# Patient Record
Sex: Male | Born: 1955 | Race: Black or African American | Hispanic: No | Smoking: Current every day smoker
Health system: Southern US, Community
[De-identification: ages and names within clinical notes are randomized; demographics above are authoritative.]

## PROBLEM LIST (undated history)

## (undated) DIAGNOSIS — Z5189 Encounter for other specified aftercare: Secondary | ICD-10-CM

## (undated) DIAGNOSIS — K649 Unspecified hemorrhoids: Secondary | ICD-10-CM

## (undated) DIAGNOSIS — N179 Acute kidney failure, unspecified: Secondary | ICD-10-CM

## (undated) DIAGNOSIS — K922 Gastrointestinal hemorrhage, unspecified: Secondary | ICD-10-CM

## (undated) DIAGNOSIS — E119 Type 2 diabetes mellitus without complications: Secondary | ICD-10-CM

## (undated) DIAGNOSIS — I10 Essential (primary) hypertension: Secondary | ICD-10-CM

## (undated) DIAGNOSIS — A048 Other specified bacterial intestinal infections: Secondary | ICD-10-CM

## (undated) DIAGNOSIS — N189 Chronic kidney disease, unspecified: Secondary | ICD-10-CM

## (undated) DIAGNOSIS — K635 Polyp of colon: Secondary | ICD-10-CM

## (undated) HISTORY — DX: Other specified bacterial intestinal infections: A04.8

## (undated) HISTORY — DX: Acute kidney failure, unspecified: N18.9

## (undated) HISTORY — DX: Gastrointestinal hemorrhage, unspecified: K92.2

## (undated) HISTORY — DX: Unspecified hemorrhoids: K64.9

## (undated) HISTORY — DX: Chronic kidney disease, unspecified: N17.9

## (undated) HISTORY — DX: Polyp of colon: K63.5

## (undated) HISTORY — PX: CARDIAC CATHETERIZATION: SHX172

---

## 2020-04-01 ENCOUNTER — Other Ambulatory Visit: Payer: Self-pay

## 2020-04-01 ENCOUNTER — Encounter (HOSPITAL_COMMUNITY): Payer: Self-pay | Admitting: Emergency Medicine

## 2020-04-01 ENCOUNTER — Emergency Department (HOSPITAL_COMMUNITY): Payer: Self-pay

## 2020-04-01 ENCOUNTER — Inpatient Hospital Stay (HOSPITAL_COMMUNITY)
Admission: EM | Admit: 2020-04-01 | Discharge: 2020-04-05 | DRG: 291 | Disposition: A | Discharge status: Home / Self Care | Payer: Self-pay | Attending: Internal Medicine | Admitting: Internal Medicine

## 2020-04-01 DIAGNOSIS — D649 Anemia, unspecified: Secondary | ICD-10-CM

## 2020-04-01 DIAGNOSIS — G4440 Drug-induced headache, not elsewhere classified, not intractable: Secondary | ICD-10-CM | POA: Diagnosis not present

## 2020-04-01 DIAGNOSIS — R0602 Shortness of breath: Secondary | ICD-10-CM

## 2020-04-01 DIAGNOSIS — D509 Iron deficiency anemia, unspecified: Secondary | ICD-10-CM | POA: Diagnosis present

## 2020-04-01 DIAGNOSIS — I5031 Acute diastolic (congestive) heart failure: Secondary | ICD-10-CM | POA: Diagnosis present

## 2020-04-01 DIAGNOSIS — I517 Cardiomegaly: Secondary | ICD-10-CM | POA: Diagnosis present

## 2020-04-01 DIAGNOSIS — F17210 Nicotine dependence, cigarettes, uncomplicated: Secondary | ICD-10-CM | POA: Diagnosis present

## 2020-04-01 DIAGNOSIS — R601 Generalized edema: Secondary | ICD-10-CM

## 2020-04-01 DIAGNOSIS — R718 Other abnormality of red blood cells: Secondary | ICD-10-CM

## 2020-04-01 DIAGNOSIS — R600 Localized edema: Secondary | ICD-10-CM

## 2020-04-01 DIAGNOSIS — I509 Heart failure, unspecified: Secondary | ICD-10-CM

## 2020-04-01 DIAGNOSIS — R7989 Other specified abnormal findings of blood chemistry: Secondary | ICD-10-CM

## 2020-04-01 DIAGNOSIS — N492 Inflammatory disorders of scrotum: Secondary | ICD-10-CM

## 2020-04-01 DIAGNOSIS — I161 Hypertensive emergency: Secondary | ICD-10-CM | POA: Diagnosis present

## 2020-04-01 DIAGNOSIS — N179 Acute kidney failure, unspecified: Secondary | ICD-10-CM | POA: Diagnosis present

## 2020-04-01 DIAGNOSIS — D6489 Other specified anemias: Secondary | ICD-10-CM | POA: Diagnosis present

## 2020-04-01 DIAGNOSIS — I10 Essential (primary) hypertension: Secondary | ICD-10-CM

## 2020-04-01 DIAGNOSIS — Z20822 Contact with and (suspected) exposure to covid-19: Secondary | ICD-10-CM | POA: Diagnosis present

## 2020-04-01 DIAGNOSIS — I5041 Acute combined systolic (congestive) and diastolic (congestive) heart failure: Secondary | ICD-10-CM | POA: Diagnosis present

## 2020-04-01 DIAGNOSIS — I43 Cardiomyopathy in diseases classified elsewhere: Secondary | ICD-10-CM | POA: Diagnosis present

## 2020-04-01 DIAGNOSIS — T463X5A Adverse effect of coronary vasodilators, initial encounter: Secondary | ICD-10-CM | POA: Diagnosis not present

## 2020-04-01 DIAGNOSIS — I110 Hypertensive heart disease with heart failure: Principal | ICD-10-CM | POA: Diagnosis present

## 2020-04-01 DIAGNOSIS — N281 Cyst of kidney, acquired: Secondary | ICD-10-CM | POA: Diagnosis present

## 2020-04-01 DIAGNOSIS — K922 Gastrointestinal hemorrhage, unspecified: Secondary | ICD-10-CM

## 2020-04-01 DIAGNOSIS — D696 Thrombocytopenia, unspecified: Secondary | ICD-10-CM

## 2020-04-01 HISTORY — DX: Essential (primary) hypertension: I10

## 2020-04-01 HISTORY — DX: Type 2 diabetes mellitus without complications: E11.9

## 2020-04-01 LAB — CBC
HCT: 19 % — ABNORMAL LOW (ref 39.0–52.0)
Hemoglobin: 5.3 g/dL — CL (ref 13.0–17.0)
MCH: 19.1 pg — ABNORMAL LOW (ref 26.0–34.0)
MCHC: 27.9 g/dL — ABNORMAL LOW (ref 30.0–36.0)
MCV: 68.3 fL — ABNORMAL LOW (ref 80.0–100.0)
Platelets: 173 10*3/uL (ref 150–400)
RBC: 2.78 MIL/uL — ABNORMAL LOW (ref 4.22–5.81)
RDW: 20.6 % — ABNORMAL HIGH (ref 11.5–15.5)
WBC: 7.3 10*3/uL (ref 4.0–10.5)
nRBC: 0 % (ref 0.0–0.2)

## 2020-04-01 LAB — ABO/RH: ABO/RH(D): O POS

## 2020-04-01 LAB — PREPARE RBC (CROSSMATCH)

## 2020-04-01 LAB — POC OCCULT BLOOD, ED: Fecal Occult Bld: POSITIVE — AB

## 2020-04-01 LAB — RESPIRATORY PANEL BY RT PCR (FLU A&B, COVID)
Influenza A by PCR: NEGATIVE
Influenza B by PCR: NEGATIVE
SARS Coronavirus 2 by RT PCR: NEGATIVE

## 2020-04-01 LAB — COMPREHENSIVE METABOLIC PANEL
ALT: 28 U/L (ref 0–44)
AST: 41 U/L (ref 15–41)
Albumin: 2.9 g/dL — ABNORMAL LOW (ref 3.5–5.0)
Alkaline Phosphatase: 93 U/L (ref 38–126)
Anion gap: 10 (ref 5–15)
BUN: 56 mg/dL — ABNORMAL HIGH (ref 8–23)
CO2: 19 mmol/L — ABNORMAL LOW (ref 22–32)
Calcium: 8.4 mg/dL — ABNORMAL LOW (ref 8.9–10.3)
Chloride: 105 mmol/L (ref 98–111)
Creatinine, Ser: 4.12 mg/dL — ABNORMAL HIGH (ref 0.61–1.24)
GFR, Estimated: 16 mL/min — ABNORMAL LOW (ref >60)
Glucose, Bld: 148 mg/dL — ABNORMAL HIGH (ref 70–99)
Potassium: 4.5 mmol/L (ref 3.5–5.1)
Sodium: 134 mmol/L — ABNORMAL LOW (ref 135–145)
Total Bilirubin: 0.7 mg/dL (ref 0.3–1.2)
Total Protein: 7.4 g/dL (ref 6.5–8.1)

## 2020-04-01 LAB — CBG MONITORING, ED: Glucose-Capillary: 108 mg/dL — ABNORMAL HIGH (ref 70–99)

## 2020-04-01 LAB — SAVE SMEAR(SSMR), FOR PROVIDER SLIDE REVIEW

## 2020-04-01 LAB — HIV ANTIBODY (ROUTINE TESTING W REFLEX): HIV Screen 4th Generation wRfx: NONREACTIVE

## 2020-04-01 IMAGING — US US RENAL
1 series · 14 of 25 positions shown · non-contrast
Comparison: None.

CLINICAL DATA: Acute renal injury

EXAM:
RENAL / URINARY TRACT ULTRASOUND COMPLETE

[Series 1: us renal · 14 of 45 slices shown]
[im 1/45]
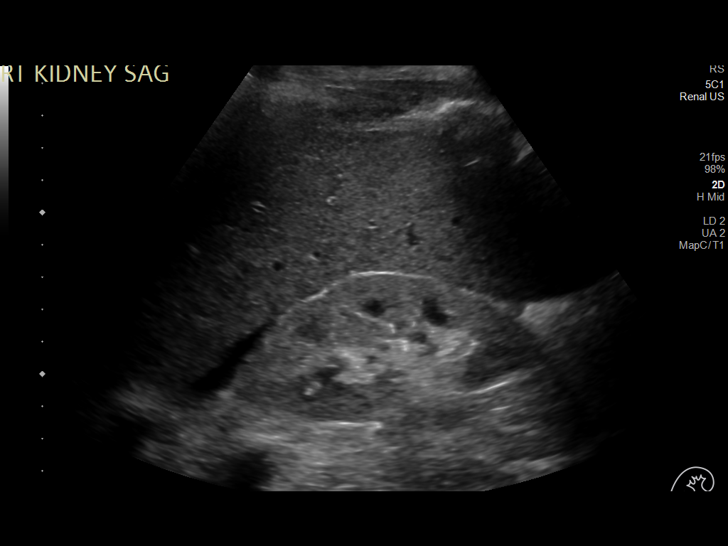
[im 4/45]
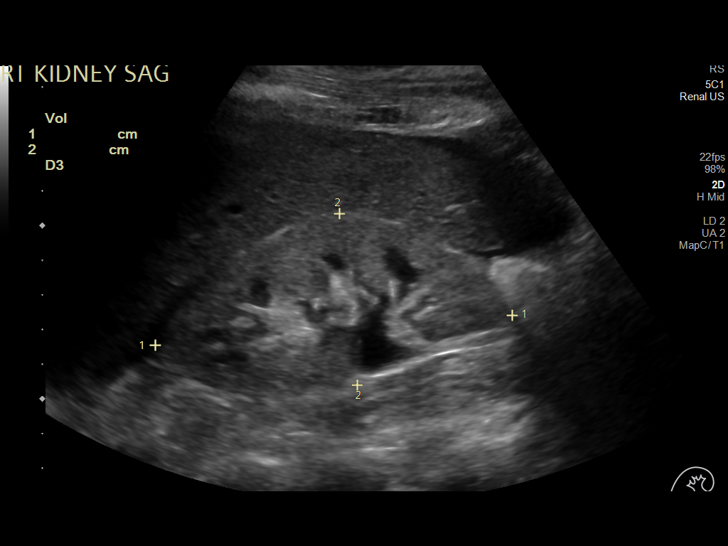
[im 8/45]
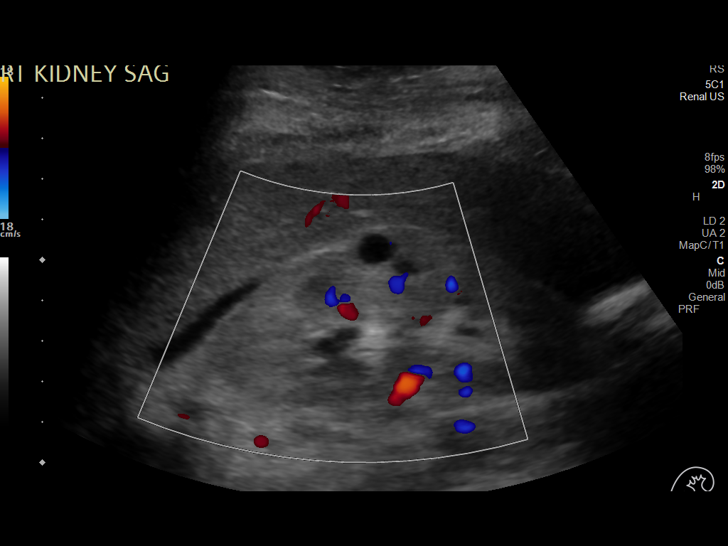
[im 12/45]
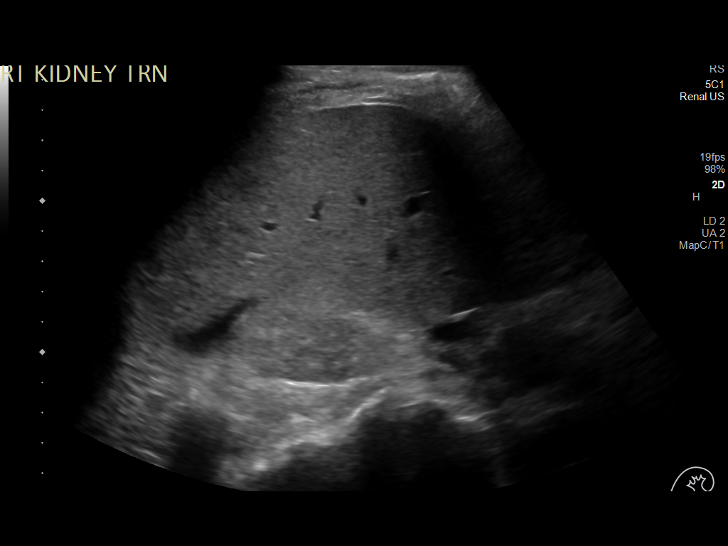
[im 15/45]
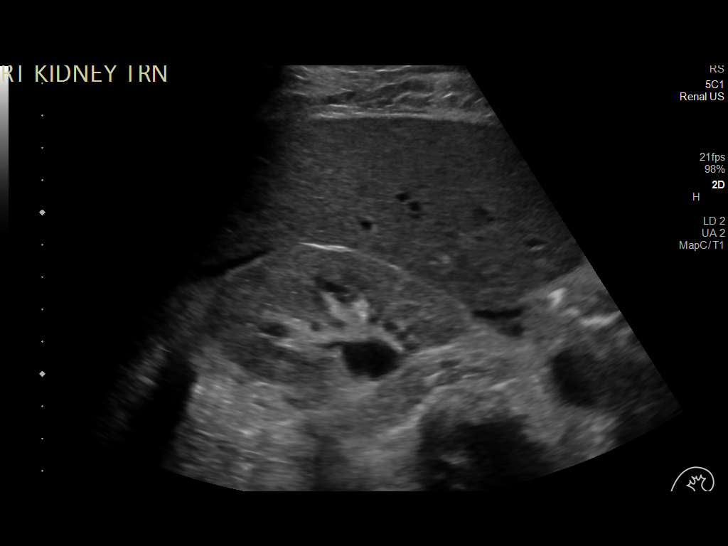
[im 17/45]
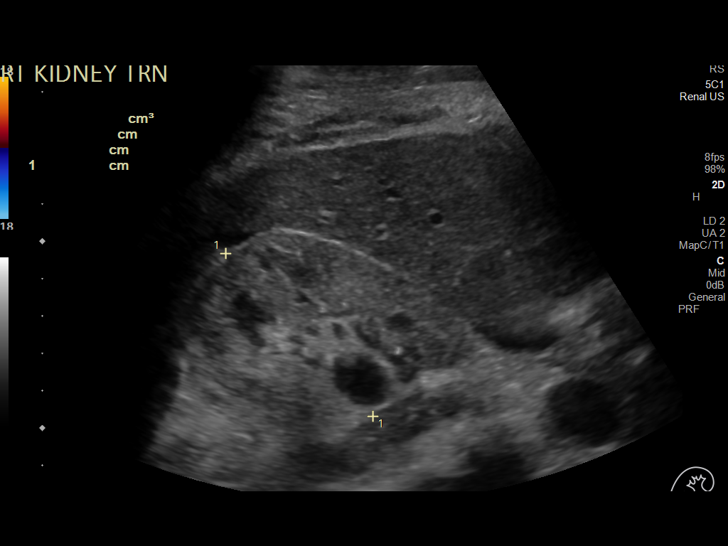
[im 21/45]
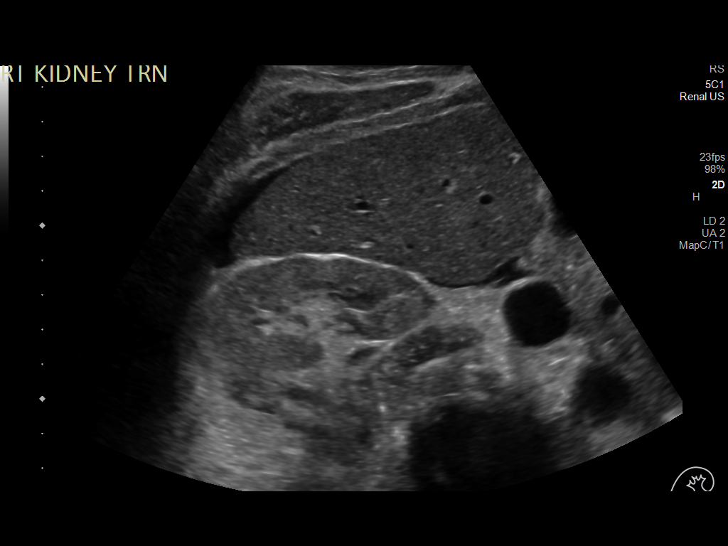
[im 24/45]
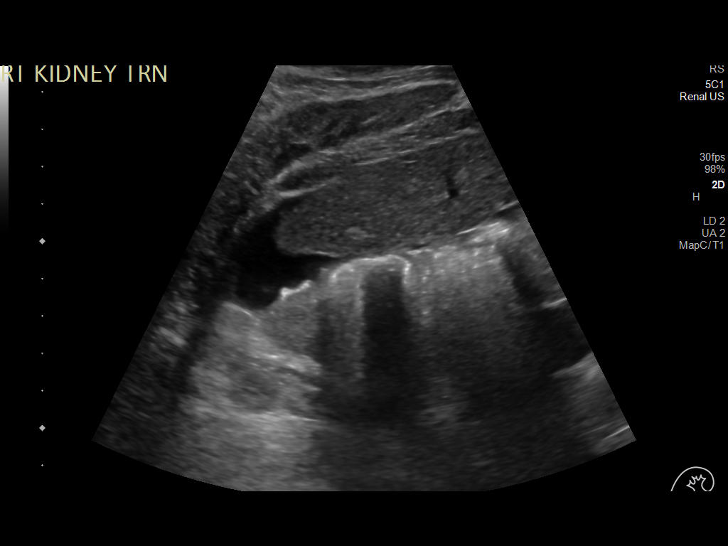
[im 28/45]
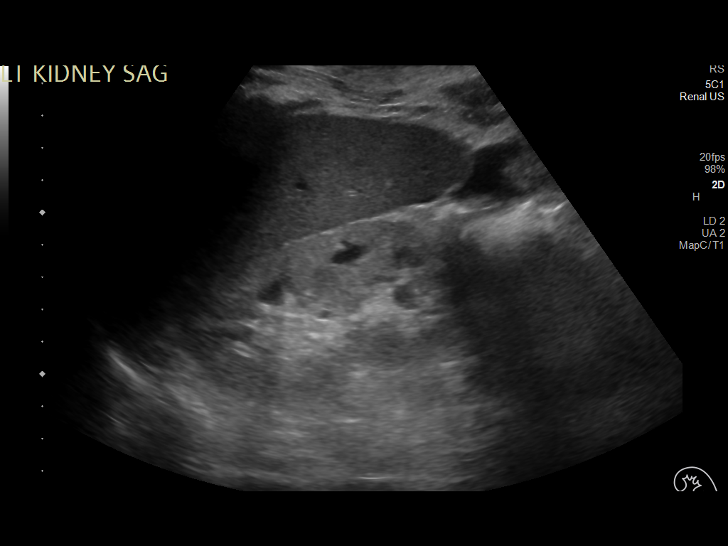
[im 30/45]
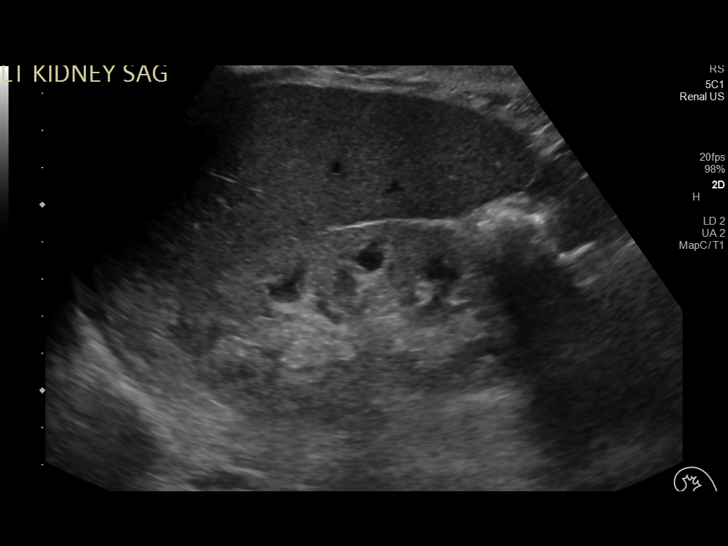
[im 34/45]
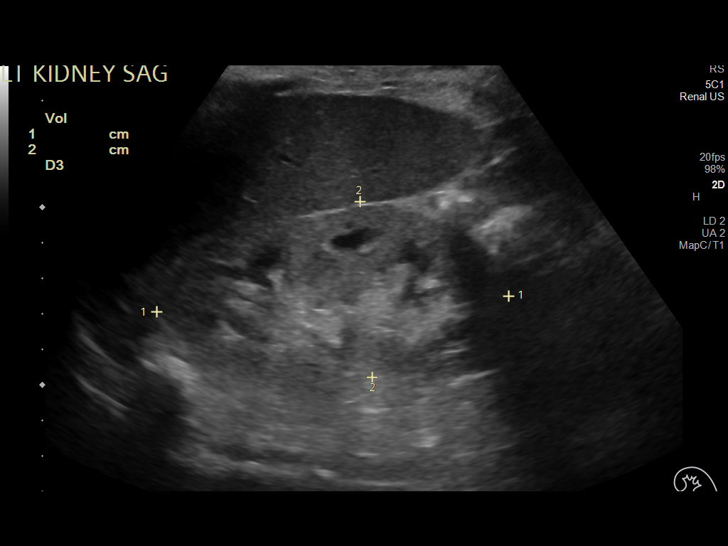
[im 37/45]
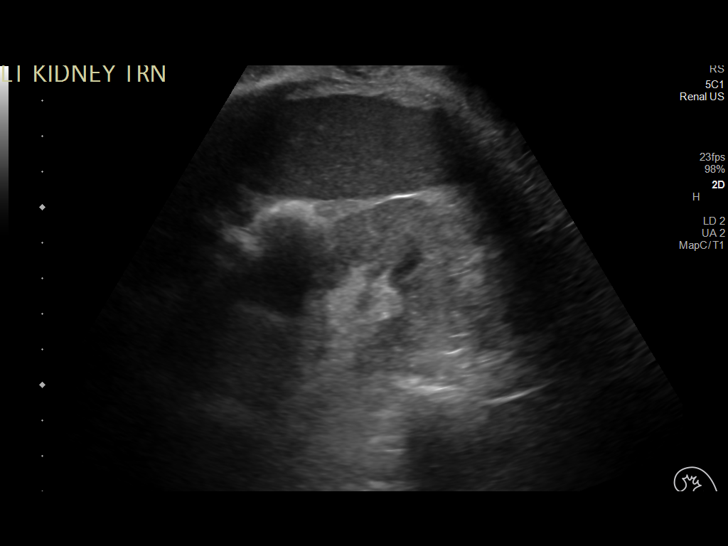
[im 41/45]
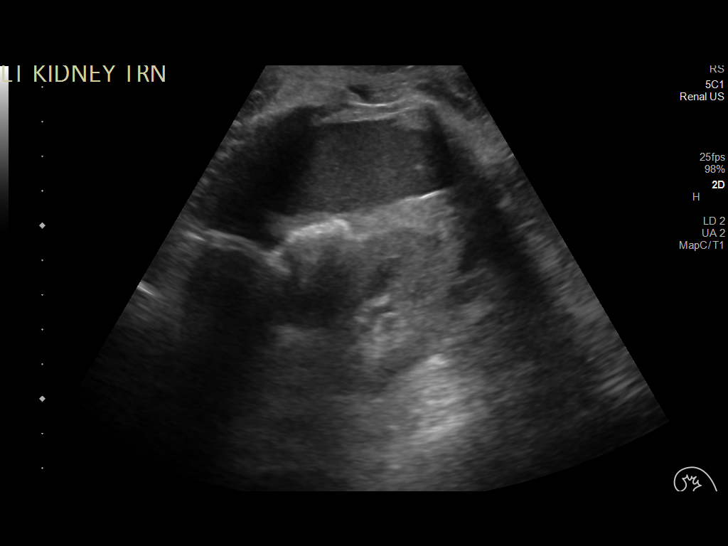
[im 45/45]
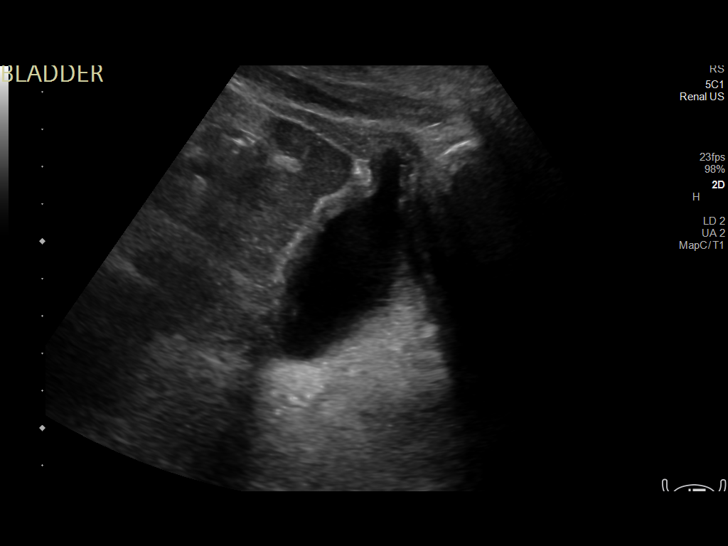

[14 of 25 positions shown; findings below may reference images not displayed]

FINDINGS: Right Kidney:

Renal measurements: 10.3 x 5.0 x 5.9 cm. = volume: 158 mL. 1.1 cm
cyst is noted in the midportion of the renal cortex on the right.
Mild increased echogenicity is noted.

Left Kidney:

Renal measurements: 9.9 x 5.0 x 5.5 cm. = volume: 141 mL.
Echogenicity within normal limits. No mass or hydronephrosis
visualized. Mild increased echogenicity is noted.

Bladder:

Decompressed

Other:

None.
IMPRESSION: Right renal cyst.  No acute abnormality is noted.

Mild increased echogenicity in the kidneys consistent with early
medical renal disease.

## 2020-04-01 MED ORDER — ONDANSETRON HCL 4 MG PO TABS: 4.0000 mg | ORAL_TABLET | Freq: Four times a day (QID) | ORAL | Status: DC | PRN

## 2020-04-01 MED ORDER — SODIUM CHLORIDE 0.9 % IV SOLN
10.0000 mL/h | Freq: Once | INTRAVENOUS | Status: AC
  Administered 2020-04-01: 10 mL/h via INTRAVENOUS

## 2020-04-01 MED ORDER — POLYETHYLENE GLYCOL 3350 17 G PO PACK: 17.0000 g | PACK | Freq: Every day | ORAL | Status: DC | PRN

## 2020-04-01 MED ORDER — INSULIN ASPART 100 UNIT/ML ~~LOC~~ SOLN
0.0000 [IU] | Freq: Three times a day (TID) | SUBCUTANEOUS | Status: DC
  Administered 2020-04-02 – 2020-04-05 (×3): 1 [IU] via SUBCUTANEOUS

## 2020-04-01 MED ORDER — LABETALOL HCL 5 MG/ML IV SOLN: 5.0000 mg | INTRAVENOUS | Status: DC | PRN

## 2020-04-01 MED ORDER — NITROGLYCERIN IN D5W 200-5 MCG/ML-% IV SOLN
0.0000 ug/min | INTRAVENOUS | Status: DC
  Administered 2020-04-01: 5 ug/min via INTRAVENOUS
  Administered 2020-04-02: 65 ug/min via INTRAVENOUS
  Administered 2020-04-03: 70 ug/min via INTRAVENOUS
  Filled 2020-04-01 (×4): qty 250

## 2020-04-01 MED ORDER — ONDANSETRON HCL 4 MG/2ML IJ SOLN: 4.0000 mg | Freq: Four times a day (QID) | INTRAMUSCULAR | Status: DC | PRN

## 2020-04-01 MED ORDER — ACETAMINOPHEN 325 MG PO TABS
650.0000 mg | ORAL_TABLET | Freq: Four times a day (QID) | ORAL | Status: DC | PRN
  Administered 2020-04-02 – 2020-04-04 (×4): 650 mg via ORAL
  Filled 2020-04-01 (×4): qty 2

## 2020-04-01 MED ORDER — ACETAMINOPHEN 650 MG RE SUPP: 650.0000 mg | Freq: Four times a day (QID) | RECTAL | Status: DC | PRN

## 2020-04-01 NOTE — ED Provider Notes (Addendum)
[facility] EMERGENCY DEPARTMENT Provider Note   CSN: [id number] Arrival date & time: 04/01/20  1538     History Chief Complaint  Patient presents with  . Abnormal Lab    Jeffrey Zuniga is a 63 y.o. male.  Patient with history of untreated hypertension and diabetes presents the emergency department today for evaluation of low hemoglobin and kidney injury.  Patient admittedly has not routinely seen a doctor and has been off of his medications for some time.  He states the last time he saw a doctor was 2 years ago for an infection in his foot.  Patient developed swelling in his bilateral lower extremities about a week ago, prompting visit to [facility].  They drew lab work and called the patient today telling him he needed to go to the hospital.  Patient reports a 1 week history of shortness of breath that is exertional in nature.  He has had some dark stools, however they are not typically dark or bloody.  He denies chest pain, cough or fever.  He is able to lie flat without becoming short of breath.  He denies heavy NSAID use.  He drinks alcohol occasionally but not routinely.  He states that he has been eating a lot of ice.        Past Medical History:  Diagnosis Date  . Diabetes mellitus without complication ([place])   . Hypertension     There are no problems to display for this patient.   The histories are not reviewed yet. Please review them in the "History" navigator section and refresh this [facility].     No family history on file.  Social History   Tobacco Use  . Smoking status: Not on file  Substance Use Topics  . Alcohol use: Not on file  . Drug use: Not on file    Home Medications Prior to Admission medications   Not on File    Allergies    Patient has no allergy information on record.  Review of Systems   Review of Systems  Constitutional: Positive for fatigue. Negative for fever.  HENT: Negative for rhinorrhea and sore  throat.   Eyes: Negative for redness.  Respiratory: Positive for shortness of breath (with exertion). Negative for cough.   Cardiovascular: Negative for chest pain.  Gastrointestinal: Negative for abdominal pain, blood in stool, diarrhea, nausea and vomiting.  Genitourinary: Negative for dysuria and hematuria.  Musculoskeletal: Negative for myalgias.  Skin: Negative for rash.  Neurological: Negative for headaches.    Physical Exam Updated Vital Signs BP (!) 205/100 (BP Location: Right Arm)   Pulse 86   Temp 98.4 F (36.9 C) (Oral)   Resp (!) 21   Ht 5\' 7"  (1.702 m)   Wt 79.8 kg   SpO2 100%   BMI 27.57 kg/m   Physical Exam Vitals and nursing note reviewed.  Constitutional:      Appearance: He is well-developed.  HENT:     Head: Normocephalic and atraumatic.  Eyes:     General:        Right eye: No discharge.        Left eye: No discharge.     Comments: Pale conjunctiva  Cardiovascular:     Rate and Rhythm: Normal rate and regular rhythm.     Heart sounds: Normal heart sounds.  Pulmonary:     Effort: Pulmonary effort is normal.     Breath sounds: Normal breath sounds.  Abdominal:  Palpations: Abdomen is soft.     Tenderness: There is no abdominal tenderness. There is no guarding or rebound.  Genitourinary:    Rectum: Guaiac result negative. No mass, tenderness or external hemorrhoid. Normal anal tone.     Comments: Brown stool noted on DRE Musculoskeletal:     Cervical back: Normal range of motion and neck supple.     Right lower leg: Edema present.     Left lower leg: Edema present.     Comments: 2-3+ pitting edema extending to knees bilaterally.   Skin:    General: Skin is warm and dry.  Neurological:     Mental Status: He is alert.     ED Results / Procedures / Treatments   Labs (all labs ordered are listed, but only abnormal results are displayed) Labs Reviewed  COMPREHENSIVE METABOLIC PANEL - Abnormal; Notable for the following components:       Result Value   Sodium 134 (*)    CO2 19 (*)    Glucose, Bld 148 (*)    BUN 56 (*)    Creatinine, Ser 4.12 (*)    Calcium 8.4 (*)    Albumin 2.9 (*)    GFR, Estimated 16 (*)    All other components within normal limits  CBC - Abnormal; Notable for the following components:   RBC 2.78 (*)    Hemoglobin 5.3 (*)    HCT 19.0 (*)    MCV 68.3 (*)    MCH 19.1 (*)    MCHC 27.9 (*)    RDW 20.6 (*)    All other components within normal limits  POC OCCULT BLOOD, ED - Abnormal; Notable for the following components:   Fecal Occult Bld POSITIVE (*)    All other components within normal limits  RESPIRATORY PANEL BY RT PCR (FLU A&B, COVID)  TYPE AND SCREEN  ABO/RH  PREPARE RBC (CROSSMATCH)    EKG None  Radiology No results found.  Procedures Procedures (including critical care time)  Medications Ordered in ED Medications  0.9 %  sodium chloride infusion (has no administration in time range)    ED Course  I have reviewed the triage vital signs and the nursing notes.  Pertinent labs & imaging results that were available during my care of the patient were reviewed by me and considered in my medical decision making (see chart for details).  Patient seen and examined. Work-up initiated. Blood ordered.   Vital signs reviewed and are as follows: BP (!) 205/100 (BP Location: Right Arm)   Pulse 86   Temp 98.4 F (36.9 C) (Oral)   Resp (!) 21   Ht 5\' 7"  (1.702 m)   Wt 79.8 kg   SpO2 100%   BMI 27.57 kg/m   Patient has anemia, unclear chronicity, but does not appear to have an acute or large-volume GI bleed.  Heme POS stools however.  Also possibly related to underlying kidney dysfunction.  Blood transfusion ordered.  Patient has kidney injury of unclear chronicity.  No sign of hypovolemia.  Blood pressure is uncontrolled and he very well could have at least an element of chronic kidney disease.  He will require admission for further evaluation and work-up.  6:05 PM Spoke with  [facility] for admission, they are now at bedside.   CRITICAL CARE Performed by: Carlisle Cater PA-C Total critical care time: 40 minutes Critical care time was exclusive of separately billable procedures and treating other patients. Critical care was necessary to treat or prevent imminent  or life-threatening deterioration. Critical care was time spent personally by me on the following activities: development of treatment plan with patient and/or surrogate as well as nursing, discussions with consultants, evaluation of patient's response to treatment, examination of patient, obtaining history from patient or surrogate, ordering and performing treatments and interventions, ordering and review of laboratory studies, ordering and review of radiographic studies, pulse oximetry and re-evaluation of patient's condition.     MDM Rules/Calculators/A&P                          Admit.   Final Clinical Impression(s) / ED Diagnoses Final diagnoses:  Symptomatic anemia  Elevated serum creatinine  Gastrointestinal hemorrhage, unspecified gastrointestinal hemorrhage type  Hypertension complications    Rx / DC Orders ED Discharge Orders    None       Carlisle Cater, PA-C 04/01/20 Dexter, [place], DO 04/01/20 1817

## 2020-04-01 NOTE — H&P (Signed)
Date: 04/01/2020               Patient Name:  Jeffrey Zuniga MRN: [id number]  DOB: 04-Mar-1956 Age / Sex: 64 y.o., male   PCP: No primary care provider on file.         Medical Service: Internal Medicine Teaching Service         Attending Physician: Dr. Lucious Groves, DO    First Contact: Dr. Alexandria Lodge Pager: [phone]  Second Contact: Dr. Maudie Mercury Pager: [phone]       After Hours (After 5p/  First Contact Pager: [phone]  weekends / holidays): Second Contact Pager: [phone]   Chief Complaint: Low hemoglobin and AKI  History of Present Illness: Jeffrey Zuniga is a 64 year old male with a PMHx of HTN and DM who is presenting to the ED for evaluation of low hemoglobin and AKI. He has not followed up with a doctor for many years and does not take any medications currently. One week ago he noticed bilateral lower leg swelling and fatigue. He started feeling short of breath on exertion. He went to visit [facility] Wednesday for further evaluation and he was found to have low hemoglobin and AKI. He was informed today about these results and told to come to the ED. he denies chest pain, palpitations, dizziness, lightheadedness numbness, headaches.  He endorses bilateral leg pain on exertion as well as shortness of breath.  He reports blurry vision however saw an eye doctor last week and was informed that he may have cataracts.  He denies stomach pain, recent infections, cough or runny nose.  Reports tingling sensation in both feet and lower extremities.  Denies any blood in urine or stool.  Of note he reports one episode of dark stool after taking a new medication Wednesday.  Also denies nausea vomiting, reflux, night sweats or chills.  He also reports scrotum swelling for the past 3 to 4 days.  States the swelling is worse in the mornings and decreases at nighttime.  Denies heavy use of NSAIDs.  Currently taking no medications. His wife passed away recently. He works at  [employer], he stays on his feet all day and never felt this before. Received the covid vaccine. Has not received the flu shot.   Meds: none  Used to be on metformin and BP meds, of which he cant remember the name.   No outpatient medications have been marked as taking for the 04/01/20 encounter [facility] Encounter).     Allergies: Allergies as of 04/01/2020  . (Not on File)   Past Medical History:  Diagnosis Date  . Diabetes mellitus without complication ([place])   . Hypertension     Family History: Unknown  Social History:   Works at [employer]. Currently smokes 3 cigarettes/day, started in the past year. Prior to this he quit smoking for ~30 years and smoked even less at that time. Drinks occassionally, not daily, only on weekends. Denies illicit drug use.   Review of Systems: A complete ROS was negative except as per HPI.   Physical Exam: Blood pressure (!) 211/101, pulse 81, temperature 98.3 F (36.8 C), temperature source Oral, resp. rate 17, height 5\' 7"  (1.702 m), weight 79.8 kg, SpO2 96 %. Physical Exam Vitals reviewed.  Constitutional:      General: He is not in acute distress.    Appearance: Normal appearance. He is normal weight. He is not ill-appearing or diaphoretic.  HENT:  Head: Normocephalic and atraumatic.  Cardiovascular:     Rate and Rhythm: Normal rate and regular rhythm.     Pulses: Normal pulses.     Heart sounds: Normal heart sounds. No murmur heard.  No friction rub. No gallop.   Pulmonary:     Effort: Pulmonary effort is normal. No respiratory distress.     Breath sounds: Normal breath sounds. No wheezing, rhonchi or rales.  Abdominal:     General: Bowel sounds are normal. There is no distension.     Palpations: Abdomen is soft.     Tenderness: There is no abdominal tenderness. There is no guarding.  Musculoskeletal:        General: No tenderness.     Right lower leg: Edema present.     Left lower leg: Edema present.  Skin:     General: Skin is warm and dry.  Neurological:     General: No focal deficit present.     Mental Status: He is alert and oriented to person, place, and time.  Psychiatric:        Mood and Affect: Mood normal.        Behavior: Behavior normal.        Thought Content: Thought content normal.        Judgment: Judgment normal.     EKG: pending  CXR: n/a  Assessment & Plan by Problem: Active Problems:   Symptomatic anemia   Hypertensive emergency   Elevated serum creatinine   Bilateral lower extremity edema   Jeffrey Zuniga is a 64 year old male with a past medical history of diabetes and hypertension currently not on medical management presenting to the ED with symptomatic anemia and an elevated serum creatinine.  Symptomatic Anemia Presented with a Hgb of 5.3, MCV 68 consistent with microcytic anemia; unclear chronicity.  Reported symptoms of shortness of breath and fatigue for approximately 1 week.  No evidence of large acute or active bleed.  Point-of-care fecal occult blood test positive.  Patient denies any dark or tarry stools.  However he noted one episode of dark stool on Wednesday after taking any medication.  Denies any heavy use of NSAIDs.  Likely a GI source, kidney disease may also be contributing.  Will consider GI consult tomorrow. - PRBC 2 units - Follow up H&H  Elevated serum creatine Cr on admission 4.12, baseline unknown. Unclear chronicity given patient's hypertension and diabetes.  Volume up on exam. Renal ultrasound showed mild increased echogenicity in the kidneys consistent with early medical renal disease.  Nonspecific right renal cyst.  Given patient's noncompliance to medical care this is a likely outcome from his diabetes and hypertension, possibly acute on chronic kidney disease.  Also cannot exclude cardiorenal syndrome. -Trend BMP  HTN urgency  Systolic has remained in the 200s. Patient denies any chest pain or headaches. Reports bloody vision but was recently  seen by an eye doctor and told he has cataracts.  -EKG pending -Telemetry -Nitroglycerin infusion with systolic blood pressure goal 160-170 -IV labetalol 5 mg every 2 hours as needed for systolic blood pressures greater than 180 -Teen blood smear and review  Lower extremity edema  1 week history of bilateral lower extremity edema.  On exam, 2+ pitting edema bilaterally up to patient's knees.  Physical exam consistent with right-sided heart failure.  Declining renal function may also be playing a role. -Echocardiogram -BNP  Hx of DM  Patient reports history of diabetes mellitus for which she took Metformin in the past.  Currently  not on any medications for this.  Reports bilateral lower extremity tingling. -Follow-up Hgb A1c -Sensitive sliding scale -CBG monitoring  Scrotal swelling Patient reports 3 to 4 days of scrotal swelling.  States this improves in the mornings and is worse at nighttime.  Denies any fevers or chills.  Will consider scrotal ultrasound tomorrow.   Diet: Heart healthy/carb modified IV fluids: None VTE prophylaxis: SCDs CODE STATUS: Full code  Dispo: Admit patient to Inpatient with expected length of stay greater than 2 midnights.  SignedMike Craze, DO 04/01/2020, 7:02 PM  Pager: [phone] After 5pm on weekdays and 1pm on weekends: On Call pager: [phone]

## 2020-04-01 NOTE — ED Triage Notes (Signed)
Pt sent from PCP due to abnormal labs at [facility] center. States he believes it was related to his kidneys and may need a blood transfusion. Pt reports dark stools starting today and lower leg swelling.

## 2020-04-02 ENCOUNTER — Inpatient Hospital Stay (HOSPITAL_COMMUNITY): Payer: Self-pay

## 2020-04-02 DIAGNOSIS — R0602 Shortness of breath: Secondary | ICD-10-CM

## 2020-04-02 DIAGNOSIS — I5041 Acute combined systolic (congestive) and diastolic (congestive) heart failure: Secondary | ICD-10-CM | POA: Diagnosis present

## 2020-04-02 DIAGNOSIS — I509 Heart failure, unspecified: Secondary | ICD-10-CM

## 2020-04-02 DIAGNOSIS — R600 Localized edema: Secondary | ICD-10-CM

## 2020-04-02 DIAGNOSIS — D509 Iron deficiency anemia, unspecified: Secondary | ICD-10-CM | POA: Diagnosis present

## 2020-04-02 DIAGNOSIS — I361 Nonrheumatic tricuspid (valve) insufficiency: Secondary | ICD-10-CM

## 2020-04-02 LAB — CBC
HCT: 22.6 % — ABNORMAL LOW (ref 39.0–52.0)
HCT: 21.8 % — ABNORMAL LOW (ref 39.0–52.0)
Hemoglobin: 6.7 g/dL — CL (ref 13.0–17.0)
Hemoglobin: 6.5 g/dL — CL (ref 13.0–17.0)
MCH: 20.6 pg — ABNORMAL LOW (ref 26.0–34.0)
MCH: 20.7 pg — ABNORMAL LOW (ref 26.0–34.0)
MCHC: 29.6 g/dL — ABNORMAL LOW (ref 30.0–36.0)
MCHC: 29.8 g/dL — ABNORMAL LOW (ref 30.0–36.0)
MCV: 69.5 fL — ABNORMAL LOW (ref 80.0–100.0)
MCV: 69.4 fL — ABNORMAL LOW (ref 80.0–100.0)
Platelets: 146 10*3/uL — ABNORMAL LOW (ref 150–400)
Platelets: 144 10*3/uL — ABNORMAL LOW (ref 150–400)
RBC: 3.25 MIL/uL — ABNORMAL LOW (ref 4.22–5.81)
RBC: 3.14 MIL/uL — ABNORMAL LOW (ref 4.22–5.81)
RDW: 21.7 % — ABNORMAL HIGH (ref 11.5–15.5)
RDW: 21.1 % — ABNORMAL HIGH (ref 11.5–15.5)
WBC: 6.8 10*3/uL (ref 4.0–10.5)
WBC: 7.2 10*3/uL (ref 4.0–10.5)
nRBC: 0 % (ref 0.0–0.2)
nRBC: 0 % (ref 0.0–0.2)

## 2020-04-02 LAB — IRON AND TIBC
Iron: 25 ug/dL — ABNORMAL LOW (ref 45–182)
Saturation Ratios: 7 % — ABNORMAL LOW (ref 17.9–39.5)
TIBC: 384 ug/dL (ref 250–450)
UIBC: 359 ug/dL

## 2020-04-02 LAB — ECHOCARDIOGRAM COMPLETE
Area-P 1/2: 5.84 cm2
Calc EF: 50 %
Height: 67 in
S' Lateral: 4 cm
Single Plane A2C EF: 53.6 %
Single Plane A4C EF: 46.9 %
Weight: 2716.07 oz

## 2020-04-02 LAB — RETICULOCYTES
Immature Retic Fract: 26.5 % — ABNORMAL HIGH (ref 2.3–15.9)
RBC.: 3.14 MIL/uL — ABNORMAL LOW (ref 4.22–5.81)
Retic Count, Absolute: 40.8 10*3/uL (ref 19.0–186.0)
Retic Ct Pct: 1.3 % (ref 0.4–3.1)

## 2020-04-02 LAB — GLUCOSE, CAPILLARY
Glucose-Capillary: 125 mg/dL — ABNORMAL HIGH (ref 70–99)
Glucose-Capillary: 109 mg/dL — ABNORMAL HIGH (ref 70–99)
Glucose-Capillary: 107 mg/dL — ABNORMAL HIGH (ref 70–99)

## 2020-04-02 LAB — BASIC METABOLIC PANEL
Anion gap: 11 (ref 5–15)
BUN: 55 mg/dL — ABNORMAL HIGH (ref 8–23)
CO2: 19 mmol/L — ABNORMAL LOW (ref 22–32)
Calcium: 8.2 mg/dL — ABNORMAL LOW (ref 8.9–10.3)
Chloride: 107 mmol/L (ref 98–111)
Creatinine, Ser: 3.7 mg/dL — ABNORMAL HIGH (ref 0.61–1.24)
GFR, Estimated: 18 mL/min — ABNORMAL LOW (ref >60)
Glucose, Bld: 84 mg/dL (ref 70–99)
Potassium: 4.6 mmol/L (ref 3.5–5.1)
Sodium: 137 mmol/L (ref 135–145)

## 2020-04-02 LAB — URINALYSIS, ROUTINE W REFLEX MICROSCOPIC
Bacteria, UA: NONE SEEN
Bilirubin Urine: NEGATIVE
Glucose, UA: NEGATIVE mg/dL
Ketones, ur: NEGATIVE mg/dL
Leukocytes,Ua: NEGATIVE
Nitrite: NEGATIVE
Protein, ur: 100 mg/dL — AB
Specific Gravity, Urine: 1.005 (ref 1.005–1.030)
pH: 7 (ref 5.0–8.0)

## 2020-04-02 LAB — MRSA PCR SCREENING: MRSA by PCR: NEGATIVE

## 2020-04-02 LAB — HEMOGLOBIN AND HEMATOCRIT, BLOOD
HCT: 25.6 % — ABNORMAL LOW (ref 39.0–52.0)
Hemoglobin: 7.8 g/dL — ABNORMAL LOW (ref 13.0–17.0)

## 2020-04-02 LAB — BRAIN NATRIURETIC PEPTIDE: B Natriuretic Peptide: 1763 pg/mL — ABNORMAL HIGH (ref 0.0–100.0)

## 2020-04-02 LAB — TECHNOLOGIST SMEAR REVIEW: Plt Morphology: ADEQUATE

## 2020-04-02 LAB — ALBUMIN: Albumin: 2.4 g/dL — ABNORMAL LOW (ref 3.5–5.0)

## 2020-04-02 LAB — FERRITIN: Ferritin: 8 ng/mL — ABNORMAL LOW (ref 24–336)

## 2020-04-02 LAB — PREPARE RBC (CROSSMATCH)

## 2020-04-02 IMAGING — DX DG CHEST 1V PORT
1 series · 1 of 1 positions shown · non-contrast
Comparison: None.

CLINICAL DATA: Shortness of breath

EXAM:
PORTABLE CHEST 1 VIEW

[chest]
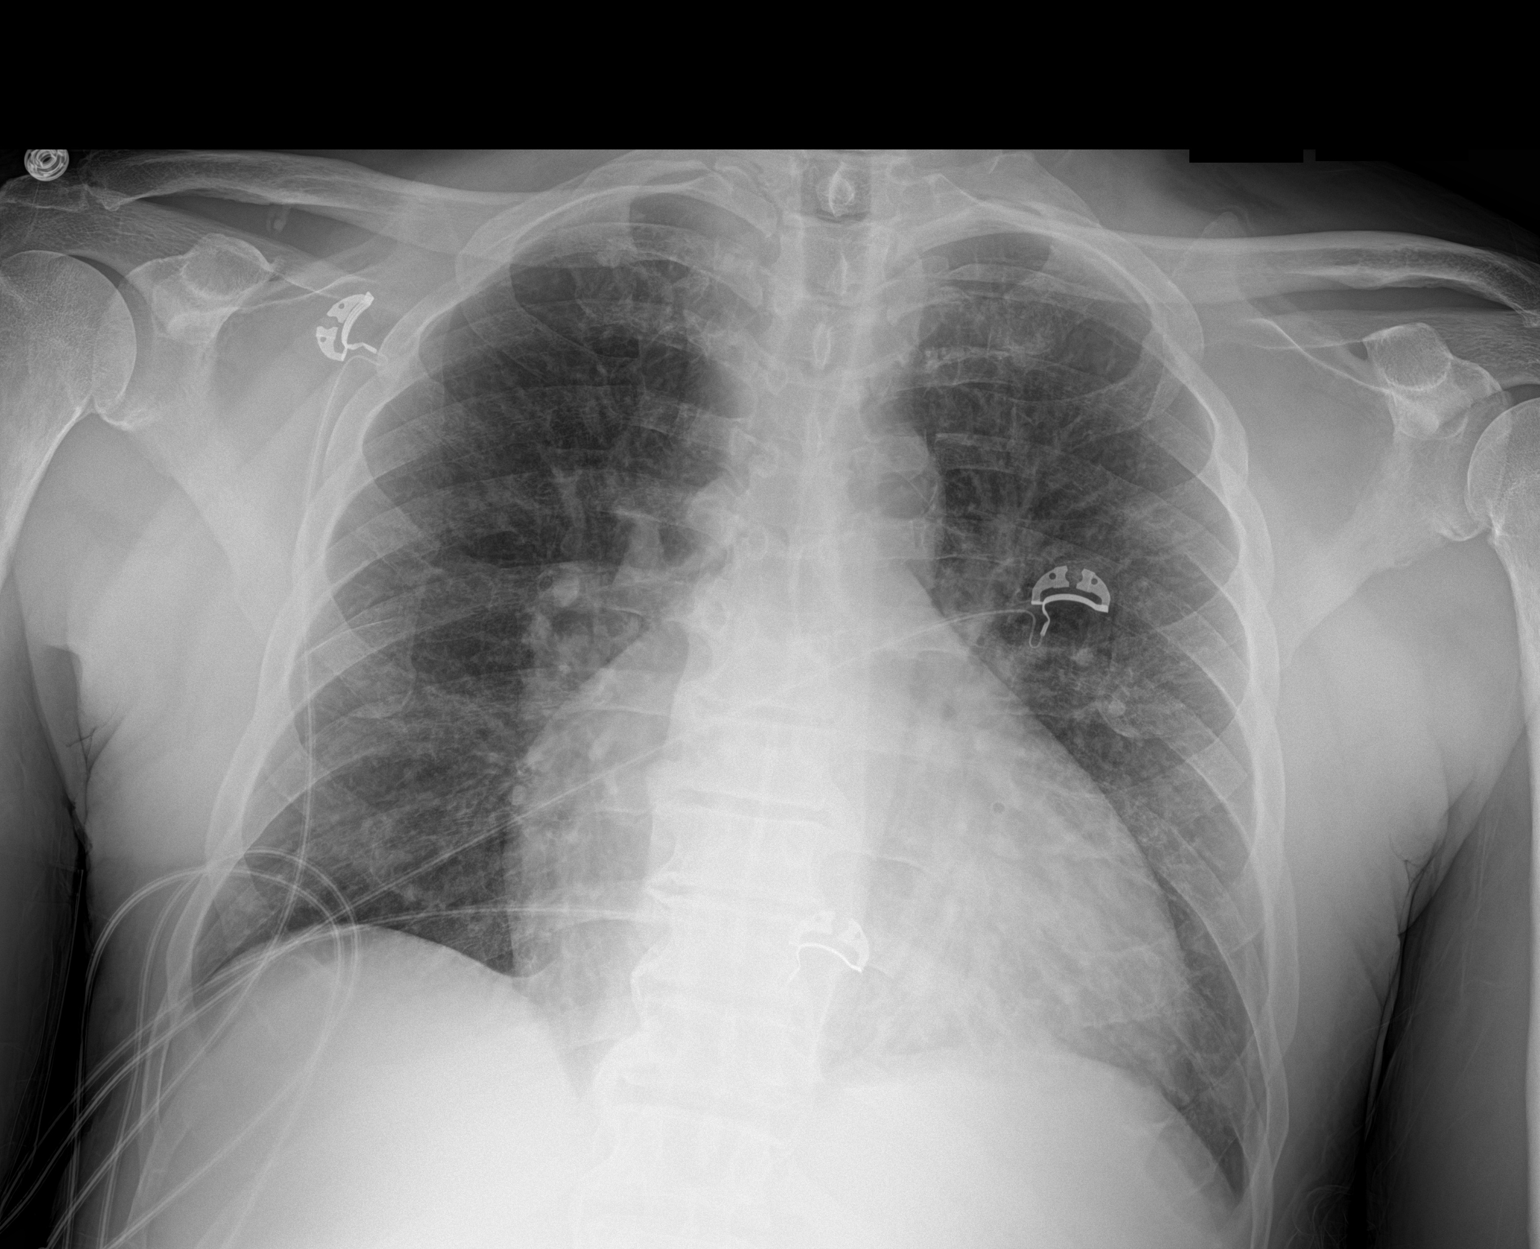

[1 of 1 positions shown; findings below may reference images not displayed]

FINDINGS: Cardiomegaly. There is no edema, consolidation, effusion, or
pneumothorax. Degenerative endplate spurring.
IMPRESSION: Cardiomegaly without failure.

## 2020-04-02 MED ORDER — SODIUM CHLORIDE 0.9% IV SOLUTION: Freq: Once | INTRAVENOUS | Status: AC

## 2020-04-02 MED ORDER — AMLODIPINE BESYLATE 5 MG PO TABS
5.0000 mg | ORAL_TABLET | Freq: Every day | ORAL | Status: DC
  Administered 2020-04-02 – 2020-04-05 (×4): 5 mg via ORAL
  Filled 2020-04-02 (×4): qty 1

## 2020-04-02 MED ORDER — FUROSEMIDE 10 MG/ML IJ SOLN
40.0000 mg | Freq: Two times a day (BID) | INTRAMUSCULAR | Status: DC
  Administered 2020-04-02 – 2020-04-05 (×7): 40 mg via INTRAVENOUS
  Filled 2020-04-02 (×7): qty 4

## 2020-04-02 NOTE — Progress Notes (Signed)
  Echocardiogram 2D Echocardiogram has been performed.  Jeffrey Zuniga 04/02/2020, 12:58 PM

## 2020-04-02 NOTE — CV Procedure (Signed)
2D echo attempted, but patient about to get blood. RN stated to try later.

## 2020-04-02 NOTE — Progress Notes (Signed)
HD#1 Subjective:   Overnight Events: Transfused 2 units pRBCs. Hgb from 5.2 to 6.7. H&H drawn 1 hour after transfusion was completed.  Evaluated at bedside during rounds. He states he is feeling better today in that he is less fatigued, and his legs feel less swollen. Denies chest pain, shortness of breath (though hasn't moved around much), dizziness. Denies ever noticing frothy urine.   Objective:   Vital signs in last 24 hours: Vitals:   04/02/20 0739 04/02/20 0900 04/02/20 1100 04/02/20 1130  BP: (!) 200/102 (!) 184/91 (!) 195/101 (!) 197/104  Pulse: 86 72 75 83  Resp: 17 14 15 15   Temp: 98.1 F (36.7 C)  97.8 F (36.6 C) 97.8 F (36.6 C)  TempSrc: Oral  Oral Oral  SpO2: 97% 96% 96% 99%  Weight:      Height:       Physical Exam Constitutional: well-appearing gentleman sitting in chair, in no acute distress, appears stated age Cardiovascular: regular rate and rhythm, very soft systolic murmur, 2+ bilateral pitting edema Pulmonary/Chest: normal work of breathing on room air, lungs clear to auscultation bilaterally Abdominal: soft, non-tender, non-distended  Pertinent Labs: CBC Latest Ref Rng & Units 04/02/2020 04/02/2020 04/01/2020  WBC 4.0 - 10.5 K/uL 7.2 6.8 7.3  Hemoglobin 13.0 - 17.0 g/dL 6.5(LL) 6.7(LL) 5.3(LL)  Hematocrit 39 - 52 % 21.8(L) 22.6(L) 19.0(L)  Platelets 150 - 400 K/uL 144(L) 146(L) 173    CMP Latest Ref Rng & Units 04/02/2020 04/01/2020  Glucose 70 - 99 mg/dL 84 148(H)  BUN 8 - 23 mg/dL 55(H) 56(H)  Creatinine 0.61 - 1.24 mg/dL 3.70(H) 4.12(H)  Sodium 135 - 145 mmol/L 137 134(L)  Potassium 3.5 - 5.1 mmol/L 4.6 4.5  Chloride 98 - 111 mmol/L 107 105  CO2 22 - 32 mmol/L 19(L) 19(L)  Calcium 8.9 - 10.3 mg/dL 8.2(L) 8.4(L)  Total Protein 6.5 - 8.1 g/dL - 7.4  Total Bilirubin 0.3 - 1.2 mg/dL - 0.7  Alkaline Phos 38 - 126 U/L - 93  AST 15 - 41 U/L - 41  ALT 0 - 44 U/L - 28    Imaging: US Renal  Result Date: 04/01/2020 CLINICAL DATA:  Acute  renal injury EXAM: RENAL / URINARY TRACT ULTRASOUND COMPLETE COMPARISON:  None. FINDINGS: Right Kidney: Renal measurements: 10.3 x 5.0 x 5.9 cm. = volume: 158 mL. 1.1 cm cyst is noted in the midportion of the renal cortex on the right. Mild increased echogenicity is noted. Left Kidney: Renal measurements: 9.9 x 5.0 x 5.5 cm. = volume: 141 mL. Echogenicity within normal limits. No mass or hydronephrosis visualized. Mild increased echogenicity is noted. Bladder: Decompressed Other: None. IMPRESSION: Right renal cyst.  No acute abnormality is noted. Mild increased echogenicity in the kidneys consistent with early medical renal disease. Electronically Signed   By: Inez Catalina M.D.   On: 04/01/2020 18:37   DG Chest Port 1 View  Result Date: 04/02/2020 CLINICAL DATA:  Shortness of breath EXAM: PORTABLE CHEST 1 VIEW COMPARISON:  None. FINDINGS: Cardiomegaly. There is no edema, consolidation, effusion, or pneumothorax. Degenerative endplate spurring. IMPRESSION: Cardiomegaly without failure. Electronically Signed   By: Monte Fantasia M.D.   On: 04/02/2020 08:50   ECHOCARDIOGRAM COMPLETE  Result Date: 04/02/2020    ECHOCARDIOGRAM REPORT   Patient Name:   Jeffrey Zuniga Date of Exam: 04/02/2020 Medical Rec #:  [id number]    Height:       67.0 in Accession #:    [id number]   Weight:  169.8 lb Date of Birth:  [birth year]    BSA:          1.886 m Patient Age:    64 years     BP:           184/91 mmHg Patient Gender: M            HR:           74 bpm. Exam Location:  Inpatient Procedure: 2D Echo, 3D Echo, Cardiac Doppler, Color Doppler and Strain Analysis Indications:    R60.0 Lower extremity edema; R06.02 SOB  History:        Patient has no prior history of Echocardiogram examinations.                 CHF, Signs/Symptoms:Dyspnea and Murmur; Risk                 Factors:Hypertension and Diabetes.  Sonographer:    Roseanna Rainbow RDCS Referring Phys: [id number] DAN FLOYD  Sonographer Comments: Global longitudinal strain was  attempted. IMPRESSIONS  1. EF mildly reduced, 45-50% with severe concentric LVH. Abnormal strain imaging with apical sparing which can be seen in cardiac amyloidosis. Review of chart demonstrates severe hypertension with poor control, which makes hypertensive heart disease more likely. Clinical correlation is recommended. Left ventricular ejection fraction, by estimation, is 45 to 50%. Left ventricular ejection fraction by 3D volume is 49 %. The left ventricle has mildly decreased function. The left ventricle demonstrates global hypokinesis. There is severe concentric left ventricular hypertrophy. Left ventricular diastolic parameters are consistent with Grade II diastolic dysfunction (pseudonormalization). Elevated left atrial pressure. The average left ventricular global longitudinal strain is -11.3 %. The global longitudinal strain is abnormal.  2. Right ventricular systolic function is normal. The right ventricular size is mildly enlarged. There is moderately elevated pulmonary artery systolic pressure. The estimated right ventricular systolic pressure is 73.5 mmHg.  3. Left atrial size was severely dilated.  4. Right atrial size was moderately dilated.  5. The mitral valve is grossly normal. Trivial mitral valve regurgitation. No evidence of mitral stenosis.  6. The aortic valve is tricuspid. Aortic valve regurgitation is trivial. No aortic stenosis is present.  7. The inferior vena cava is dilated in size with <50% respiratory variability, suggesting right atrial pressure of 15 mmHg. FINDINGS  Left Ventricle: EF mildly reduced, 45-50% with severe concentric LVH. Abnormal strain imaging with apical sparing which can be seen in cardiac amyloidosis. Review of chart demonstrates severe hypertension with poor control, which makes hypertensive heart disease more likely. Clinical correlation is recommended. Left ventricular ejection fraction, by estimation, is 45 to 50%. Left ventricular ejection fraction by 3D  volume is 49 %. The left ventricle has mildly decreased function. The left ventricle  demonstrates global hypokinesis. The average left ventricular global longitudinal strain is -11.3 %. The global longitudinal strain is abnormal. The left ventricular internal cavity size was normal in size. There is severe concentric left ventricular hypertrophy. Left ventricular diastolic parameters are consistent with Grade II diastolic dysfunction (pseudonormalization). Elevated left atrial pressure. Right Ventricle: The right ventricular size is mildly enlarged. No increase in right ventricular wall thickness. Right ventricular systolic function is normal. There is moderately elevated pulmonary artery systolic pressure. The tricuspid regurgitant velocity is 3.19 m/s, and with an assumed right atrial pressure of 15 mmHg, the estimated right ventricular systolic pressure is 32.9 mmHg. Left Atrium: Left atrial size was severely dilated. Right Atrium: Right atrial size was moderately dilated. Pericardium: Trivial  pericardial effusion is present. Mitral Valve: The mitral valve is grossly normal. Trivial mitral valve regurgitation. No evidence of mitral valve stenosis. MV peak gradient, 10.9 mmHg. The mean mitral valve gradient is 3.0 mmHg. Tricuspid Valve: The tricuspid valve is grossly normal. Tricuspid valve regurgitation is mild . No evidence of tricuspid stenosis. Aortic Valve: The aortic valve is tricuspid. Aortic valve regurgitation is trivial. No aortic stenosis is present. Pulmonic Valve: The pulmonic valve was grossly normal. Pulmonic valve regurgitation is trivial. No evidence of pulmonic stenosis. Aorta: The aortic root and ascending aorta are structurally normal, with no evidence of dilitation. Venous: The inferior vena cava is dilated in size with less than 50% respiratory variability, suggesting right atrial pressure of 15 mmHg. IAS/Shunts: The atrial septum is grossly normal.  LEFT VENTRICLE PLAX 2D LVIDd:          4.90 cm         Diastology LVIDs:         4.00 cm         LV e' medial:    5.98 cm/s LV PW:         1.60 cm         LV E/e' medial:  24.4 LV IVS:        1.52 cm         LV e' lateral:   5.22 cm/s LVOT diam:     2.00 cm         LV E/e' lateral: 28.0 LV SV:         99 LV SV Index:   52              2D LVOT Area:     3.14 cm        Longitudinal                                Strain                                2D Strain GLS  -11.3 % LV Volumes (MOD)               Avg: LV vol d, MOD    121.0 ml A2C:                           3D Volume EF LV vol d, MOD    150.0 ml      LV 3D EF:    Left A4C:                                        ventricular LV vol s, MOD    56.1 ml                    ejection A2C:                                        fraction by LV vol s, MOD    79.7 ml                    3D volume A4C:  is 49 %. LV SV MOD A2C:   64.9 ml LV SV MOD A4C:   150.0 ml LV SV MOD BP:    67.7 ml       3D Volume EF:                                3D EF:        49 % RIGHT VENTRICLE             IVC RV S prime:     11.30 cm/s  IVC diam: 2.60 cm TAPSE (M-mode): 2.6 cm LEFT ATRIUM             Index       RIGHT ATRIUM           Index LA diam:        4.00 cm 2.12 cm/m  RA Area:     26.70 cm LA Vol (A2C):   41.5 ml 22.00 ml/m RA Volume:   99.00 ml  52.49 ml/m LA Vol (A4C):   91.5 ml 48.51 ml/m LA Biplane Vol: 62.8 ml 33.30 ml/m  AORTIC VALVE              PULMONIC VALVE LVOT Vmax:   139.00 cm/s  PR End Diast Vel: 1.84 msec LVOT Vmean:  104.000 cm/s LVOT VTI:    0.315 m  AORTA Ao Root diam: 3.40 cm Ao Asc diam:  3.40 cm MITRAL VALVE                TRICUSPID VALVE MV Area (PHT): 5.84 cm     TR Peak grad:   40.7 mmHg MV Peak grad:  10.9 mmHg    TR Vmax:        319.00 cm/s MV Mean grad:  3.0 mmHg MV Vmax:       1.65 m/s     SHUNTS MV Vmean:      82.2 cm/s    Systemic VTI:  0.32 m MV Decel Time: 130 msec     Systemic Diam: 2.00 cm MV E velocity: 146.00 cm/s MV A velocity: 94.70 cm/s MV E/A  ratio:  1.54 Eleonore Chiquito MD Electronically signed by Eleonore Chiquito MD Signature Date/Time: 04/02/2020/1:13:23 PM    Final     Assessment/Plan:   Active Problems:   Symptomatic anemia   Hypertensive emergency   Elevated serum creatinine   Bilateral lower extremity edema   Iron deficiency anemia   Acute heart failure [facility])   Patient Summary:  Jeffrey Zuniga is a 64 year old male with a past medical history of diabetes and hypertension currently not on medical management who presented with symptomatic anemia and an elevated serum creatinine and admitted for evaluation and management of hypertension, acute heart failure, and acute renal failure.  This is hospital day 1.   Acute heart failure with preserved ejection fraction Acute renal failure unknown baseline Essential hypertension Iron deficiency anemia FOBT positive  Patient reports improved energy after transfusion of 2 u pRBCs. Hgb 6.5 from 5.3, so will transfuse an additional unit. Iron studies consistent with iron deficiency anemia with ferritin 8, iron 25. Blood smear shows some schistocytes as well as target cells and teardrop cells which suspect is most likely due to iron deficiency anemia, though schistocytes may be partially due to uremia. Patient would likely benefit from IV iron infusion before discharge. No signs of acute blood loss. We will consider GI consultation this admission  vs close outpatient follow-up.   BP uncontrolled with systolics 552C-802M on nitroglycerin drip. Exam with soft systolic murmur and 2+ pitting edema. BNP elevated at 1,763. Echocardiogram demonstrates mildly reduced LVEF at 45-50% with severe concentric left ventricular hypertrophy, grade II diastolic dysfunction, elevated left atrial pressures, moderately elevated pulmonary artery systolic pressures, severe LA dilatation. Findings most consistent with hypertensive cardiomyopathy. Will start diuresis with IV Lasix 40 mg twice daily, start amlodipine 5 mg  daily, and continue nitroglycerin drip though try to wean as pressures improve.   Serum creatinine decreased slightly to 3.70 from 4.12. Urinalysis bland except for 100 mg/dL protein. Serum albumin 2.4 Suspect hypertensive nephropathy though hope to see improvement in kidney function with diuresis.  - 1 u pRBC - Follow-up PM H&H - Consider IV iron transfusion this admission - IV Lasix 40 mg BID - Continue nitro drip with systolic BP goal 336-122, wean as tolerated - Start amlodipine 5 mg - Strict I&Os - Daily weights  History of diabetes mellitus type 2 Reported history of diabetes. Hemoglobin A1c pending. Blood glucoses have ranged 80s-120s requiring 1 u sliding scale so far. - Hemloglobin A1c pending - SSI  Diet: Heart healthy; carb-modified IVF: None,None VTE: SCDs Code: Full   Please contact the on call pager after 5 pm and on weekends at [phone].  Alexandria Lodge, MD PGY-1 Internal Medicine Teaching Service Pager: [phone] 04/02/2020

## 2020-04-02 NOTE — Progress Notes (Signed)
MD on call notified about pt hgb of 6.7. It was also mentioned that the pts h&h was drawn before the 2 hour mark. MD stated "That's fine for now", and we will continue to monitor.

## 2020-04-03 LAB — TYPE AND SCREEN
ABO/RH(D): O POS
Antibody Screen: NEGATIVE
Unit division: 0
Unit division: 0
Unit division: 0

## 2020-04-03 LAB — CBC
HCT: 27 % — ABNORMAL LOW (ref 39.0–52.0)
Hemoglobin: 8.1 g/dL — ABNORMAL LOW (ref 13.0–17.0)
MCH: 21.5 pg — ABNORMAL LOW (ref 26.0–34.0)
MCHC: 30 g/dL (ref 30.0–36.0)
MCV: 71.6 fL — ABNORMAL LOW (ref 80.0–100.0)
Platelets: 159 10*3/uL (ref 150–400)
RBC: 3.77 MIL/uL — ABNORMAL LOW (ref 4.22–5.81)
RDW: 21.8 % — ABNORMAL HIGH (ref 11.5–15.5)
WBC: 9.3 10*3/uL (ref 4.0–10.5)
nRBC: 0 % (ref 0.0–0.2)

## 2020-04-03 LAB — BASIC METABOLIC PANEL
Anion gap: 10 (ref 5–15)
BUN: 50 mg/dL — ABNORMAL HIGH (ref 8–23)
CO2: 21 mmol/L — ABNORMAL LOW (ref 22–32)
Calcium: 8.1 mg/dL — ABNORMAL LOW (ref 8.9–10.3)
Chloride: 106 mmol/L (ref 98–111)
Creatinine, Ser: 3.67 mg/dL — ABNORMAL HIGH (ref 0.61–1.24)
GFR, Estimated: 18 mL/min — ABNORMAL LOW (ref >60)
Glucose, Bld: 120 mg/dL — ABNORMAL HIGH (ref 70–99)
Potassium: 4.5 mmol/L (ref 3.5–5.1)
Sodium: 137 mmol/L (ref 135–145)

## 2020-04-03 LAB — BPAM RBC
Blood Product Expiration Date: 202111232359
Blood Product Expiration Date: 202111232359
Blood Product Expiration Date: 202111242359
ISSUE DATE / TIME: 202110221805
ISSUE DATE / TIME: 202110222224
ISSUE DATE / TIME: 202110231109
Unit Type and Rh: 5100
Unit Type and Rh: 5100
Unit Type and Rh: 5100

## 2020-04-03 LAB — GLUCOSE, CAPILLARY
Glucose-Capillary: 116 mg/dL — ABNORMAL HIGH (ref 70–99)
Glucose-Capillary: 144 mg/dL — ABNORMAL HIGH (ref 70–99)
Glucose-Capillary: 99 mg/dL (ref 70–99)
Glucose-Capillary: 120 mg/dL — ABNORMAL HIGH (ref 70–99)

## 2020-04-03 MED ORDER — NITROGLYCERIN IN D5W 200-5 MCG/ML-% IV SOLN
0.0000 ug/min | INTRAVENOUS | Status: DC
  Administered 2020-04-03: 19.5 ug/min via INTRAVENOUS

## 2020-04-03 NOTE — Progress Notes (Addendum)
Spoke with dr Laural Golden, she would like to start weaning nitro gtt down, continue monitoring blood pressure, she is not sure of what protocol is to use, I have made dose/rate change to 19.5 from 21 at 10:10 after talking with charge nurse, current blood pressure is 178/103, heart rate 77...  rate change to 18.5 at 12:30, bp reading 187/96 ,heart rate 87---advised teaching team, they are not continuing weaning at this point, he will put order back in for now--patient remained asymptomatic through rate changes

## 2020-04-03 NOTE — Progress Notes (Signed)
HD#2 Subjective:   Overnight Events: Transfused 1 unit PRBC overnight.  Follow-up hemoglobin 7.8.  Evaluated at the bedside this morning.  States he is feeling well.  Reportedly had a headache overnight which responded well to Tylenol.  States his blurry vision has improved.  Denies any chest pain, shortness of breath or abdominal pain this morning.  Has not had a bowel movement since admission.  Denies any blood in urine.  Objective:   Vital signs in last 24 hours: Vitals:   04/02/20 2300 04/03/20 0050 04/03/20 0340 04/03/20 0730  BP: (!) 173/83 (!) 159/76 (!) 159/81 (!) 191/94  Pulse:   83 79  Resp:   16 14  Temp: 98.1 F (36.7 C)  98.2 F (36.8 C)   TempSrc: Oral  Oral   SpO2:  96% 97% 97%  Weight:      Height:       Physical Exam General: Appears stated age, comfortably sitting at the bedside in no acute distress CV: regular rate and rhythm, very soft systolic murmur, bilateral LE pitting edema Lungs: normal work of breathing on room air, lungs clear to auscultation bilaterally Abdominal: soft, non-tender, non-distended  Pertinent Labs: CBC Latest Ref Rng & Units 04/02/2020 04/02/2020 04/02/2020  WBC 4.0 - 10.5 K/uL - 7.2 6.8  Hemoglobin 13.0 - 17.0 g/dL 7.8(L) 6.5(LL) 6.7(LL)  Hematocrit 39 - 52 % 25.6(L) 21.8(L) 22.6(L)  Platelets 150 - 400 K/uL - 144(L) 146(L)    CMP Latest Ref Rng & Units 04/02/2020 04/01/2020  Glucose 70 - 99 mg/dL 84 148(H)  BUN 8 - 23 mg/dL 55(H) 56(H)  Creatinine 0.61 - 1.24 mg/dL 3.70(H) 4.12(H)  Sodium 135 - 145 mmol/L 137 134(L)  Potassium 3.5 - 5.1 mmol/L 4.6 4.5  Chloride 98 - 111 mmol/L 107 105  CO2 22 - 32 mmol/L 19(L) 19(L)  Calcium 8.9 - 10.3 mg/dL 8.2(L) 8.4(L)  Total Protein 6.5 - 8.1 g/dL - 7.4  Total Bilirubin 0.3 - 1.2 mg/dL - 0.7  Alkaline Phos 38 - 126 U/L - 93  AST 15 - 41 U/L - 41  ALT 0 - 44 U/L - 28    Imaging: Echocardiogram-ejection fraction 45 to 50% with severe concentric left ventricular hypertrophy.   Abdominal strain with apical sparing.  Left ventricle demonstrates global hypokinesis.  Assessment/Plan:   Active Problems:   Symptomatic anemia   Hypertensive emergency   Elevated serum creatinine   Bilateral lower extremity edema   Iron deficiency anemia   Acute heart failure [facility])   Patient Summary:  Jeffrey Zuniga is a 64 year old male with a past medical history of diabetes and hypertension currently not on medical management who presented with symptomatic anemia and an elevated serum creatinine and admitted for evaluation and management of hypertension, acute heart failure, and acute renal failure.  This is hospital day 2.   Acute heart failure with preserved ejection fraction Acute renal failure unknown baseline Essential hypertension On evaluation today blood pressure with systolics in the 505-397Q on nitroglycerin drip.  On exam, bilateral lower extremity edema has improved.  Adequate response to IV Lasix with urine output approximately 6 L in the past 24 hours.  Weight 77 kg, down from 80 kg.  Repeat BMP pending.  Will plan to wean off nitro drip once pressures are better controlled and as he is transitioned on to oral antihypertensives; of note amlodipine typically takes a couple days to take effect.  - continue nitroglycerin drip - continue IV Lasix 40 mg twice  daily - continue amlodipine 5 mg - strict I&Os - daily weights - trend bmp  Iron deficiency anemia FOBT positive Patient received an additional 1 unit of PRBC last night with follow-up hemoglobin of 7.8.  Now status post a total of 3 units PRBC.  Discussed iron infusion prior to discharge.  Patient continues to have no signs of acute blood loss.  States he had a colonoscopy at the age of 43 which showed polyps and was told to follow-up in 10 years.  Will need to follow with GI outpatient. - s/p 3 units PRBC - trend cbc  - consider IV iron transfusion this admission  History of diabetes mellitus type 2 Hemoglobin  A1c pending.  Blood glucose 116 this a.m.  Not requiring sliding scale. - Hemloglobin A1c pending - SSI  Diet: Heart healthy; carb-modified IVF: None,None VTE: SCDs Code: Full  Dispo: Anticipated discharge pending clinical improvement.  Please contact the on call pager after 5 pm and on weekends at [phone].  Jeffrey Ohms, DO PGY-2 Internal Medicine Teaching Service Pager: [phone] 04/03/2020

## 2020-04-04 ENCOUNTER — Inpatient Hospital Stay (HOSPITAL_COMMUNITY): Payer: Self-pay

## 2020-04-04 ENCOUNTER — Other Ambulatory Visit: Payer: Self-pay

## 2020-04-04 ENCOUNTER — Encounter (HOSPITAL_COMMUNITY): Payer: Self-pay | Admitting: Internal Medicine

## 2020-04-04 DIAGNOSIS — D649 Anemia, unspecified: Secondary | ICD-10-CM

## 2020-04-04 DIAGNOSIS — I517 Cardiomegaly: Secondary | ICD-10-CM | POA: Diagnosis present

## 2020-04-04 HISTORY — DX: Anemia, unspecified: D64.9

## 2020-04-04 LAB — BASIC METABOLIC PANEL
Anion gap: 10 (ref 5–15)
BUN: 51 mg/dL — ABNORMAL HIGH (ref 8–23)
CO2: 25 mmol/L (ref 22–32)
Calcium: 8.1 mg/dL — ABNORMAL LOW (ref 8.9–10.3)
Chloride: 102 mmol/L (ref 98–111)
Creatinine, Ser: 3.69 mg/dL — ABNORMAL HIGH (ref 0.61–1.24)
GFR, Estimated: 18 mL/min — ABNORMAL LOW (ref >60)
Glucose, Bld: 117 mg/dL — ABNORMAL HIGH (ref 70–99)
Potassium: 3.9 mmol/L (ref 3.5–5.1)
Sodium: 137 mmol/L (ref 135–145)

## 2020-04-04 LAB — HEMOGLOBIN A1C
Hgb A1c MFr Bld: 6.1 % — ABNORMAL HIGH (ref 4.8–5.6)
Mean Plasma Glucose: 128 mg/dL

## 2020-04-04 LAB — CBC
HCT: 30.5 % — ABNORMAL LOW (ref 39.0–52.0)
Hemoglobin: 9.3 g/dL — ABNORMAL LOW (ref 13.0–17.0)
MCH: 21.5 pg — ABNORMAL LOW (ref 26.0–34.0)
MCHC: 30.5 g/dL (ref 30.0–36.0)
MCV: 70.6 fL — ABNORMAL LOW (ref 80.0–100.0)
Platelets: 169 10*3/uL (ref 150–400)
RBC: 4.32 MIL/uL (ref 4.22–5.81)
RDW: 21.9 % — ABNORMAL HIGH (ref 11.5–15.5)
WBC: 8.6 10*3/uL (ref 4.0–10.5)
nRBC: 0 % (ref 0.0–0.2)

## 2020-04-04 LAB — GLUCOSE, CAPILLARY
Glucose-Capillary: 102 mg/dL — ABNORMAL HIGH (ref 70–99)
Glucose-Capillary: 96 mg/dL (ref 70–99)
Glucose-Capillary: 93 mg/dL (ref 70–99)
Glucose-Capillary: 108 mg/dL — ABNORMAL HIGH (ref 70–99)

## 2020-04-04 LAB — PROTIME-INR
INR: 1.2 (ref 0.8–1.2)
Prothrombin Time: 15.2 seconds (ref 11.4–15.2)

## 2020-04-04 IMAGING — US US ABDOMEN LIMITED
1 series · 14 of 25 positions shown · non-contrast
Comparison: Renal ultrasound [DATE].

CLINICAL DATA: Target cells in peripheral blood smear.
Thrombocytopenia.

EXAM:
ULTRASOUND ABDOMEN LIMITED RIGHT UPPER QUADRANT

[Series 1: us abdomen limited ruq (liver/gb) · 14 of 72 slices shown]
[im 1/72]
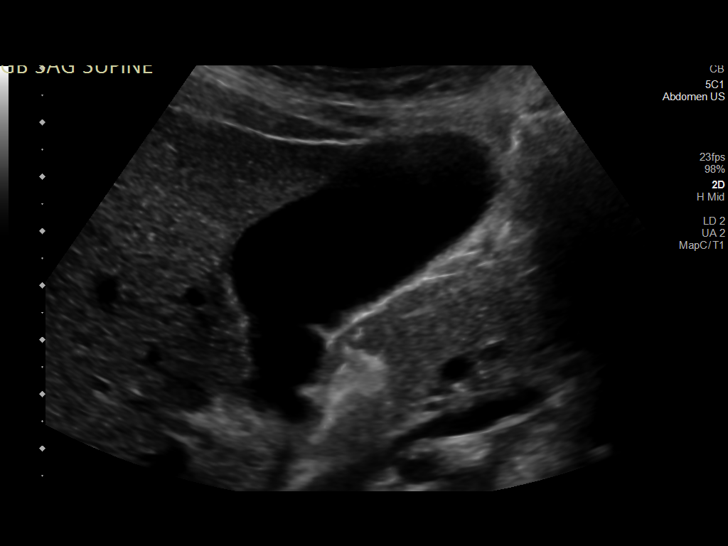
[im 6/72]
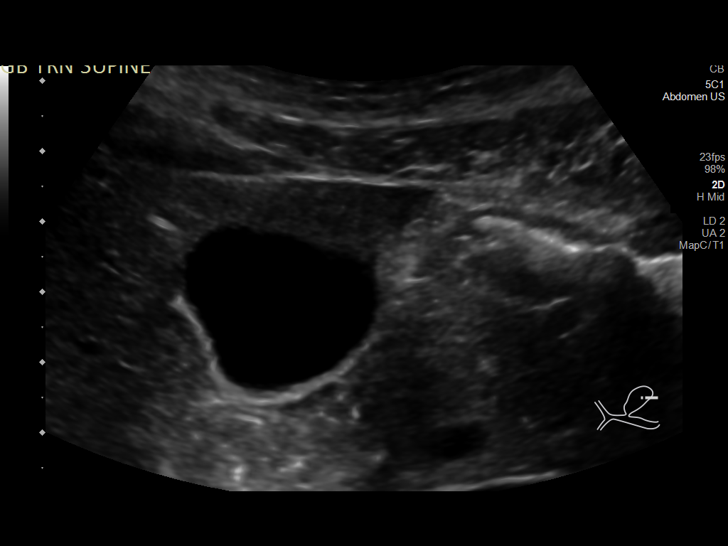
[im 12/72]
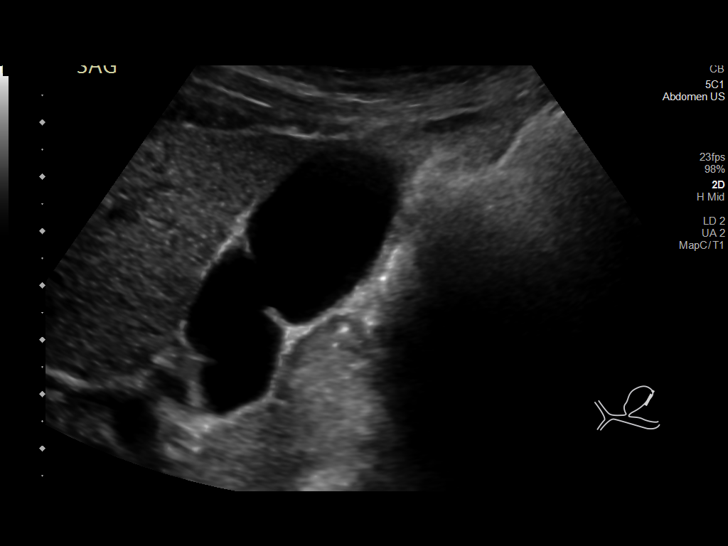
[im 18/72]
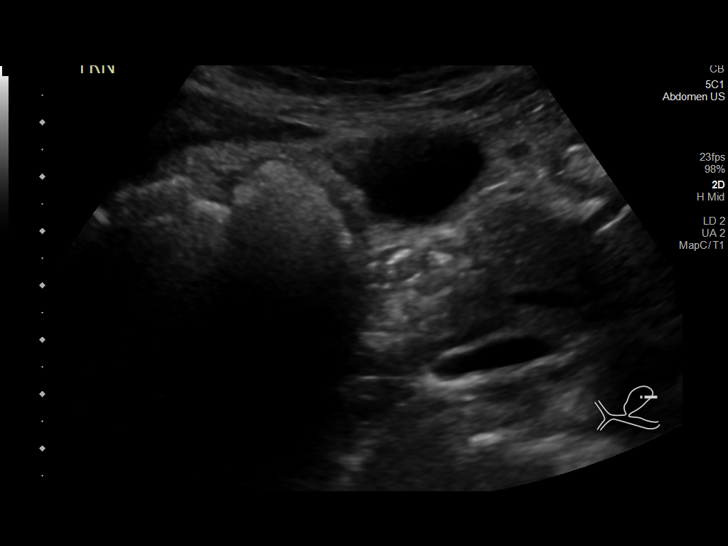
[im 24/72]
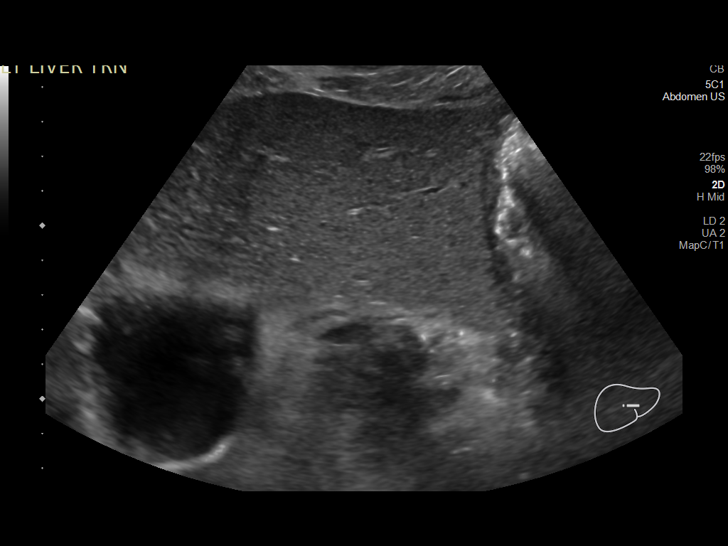
[im 27/72]
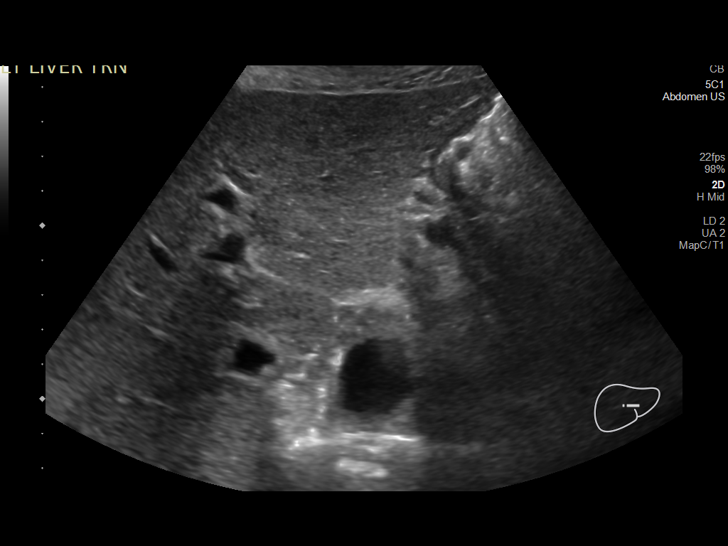
[im 33/72]
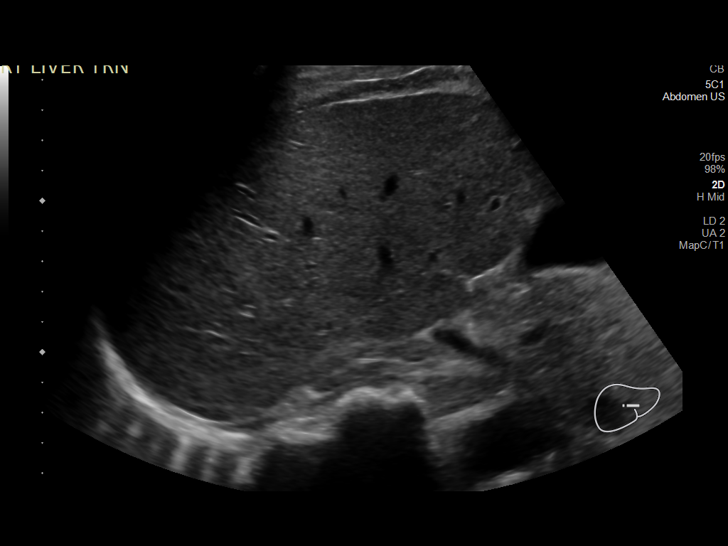
[im 39/72]
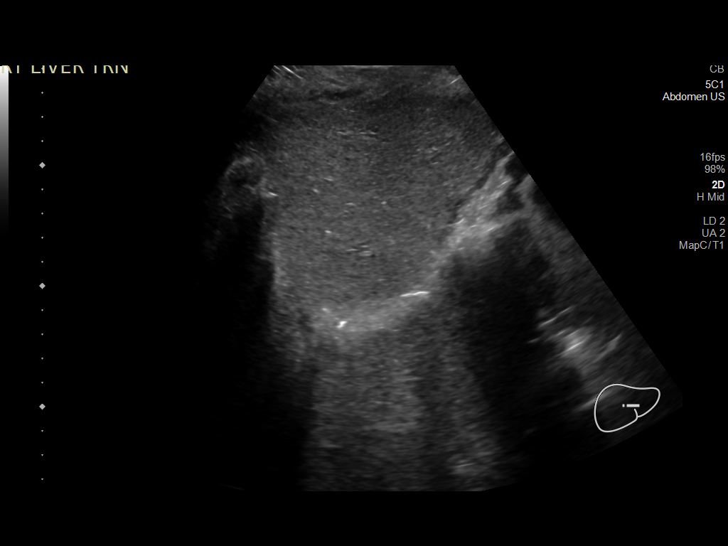
[im 45/72]
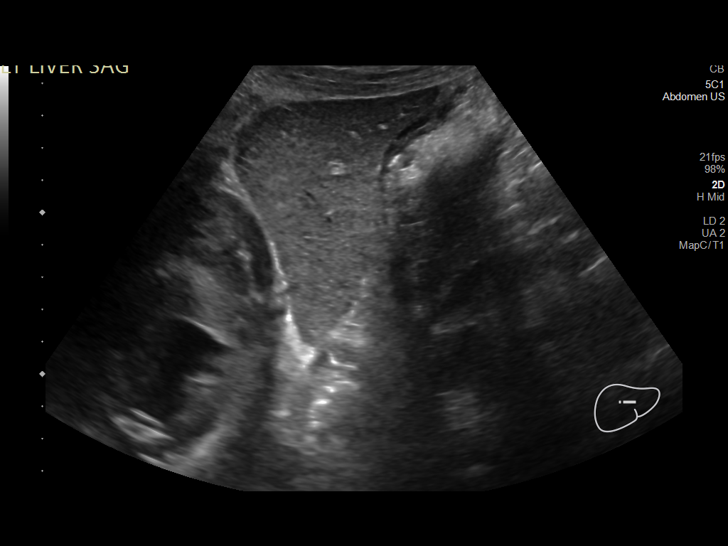
[im 48/72]
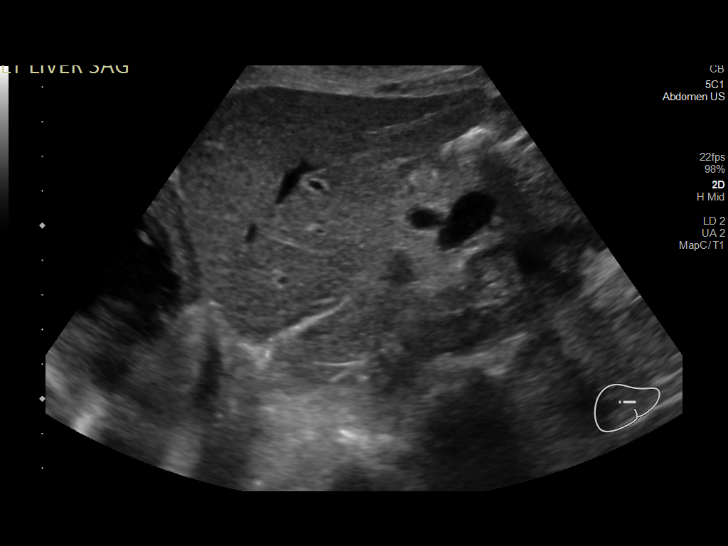
[im 54/72]
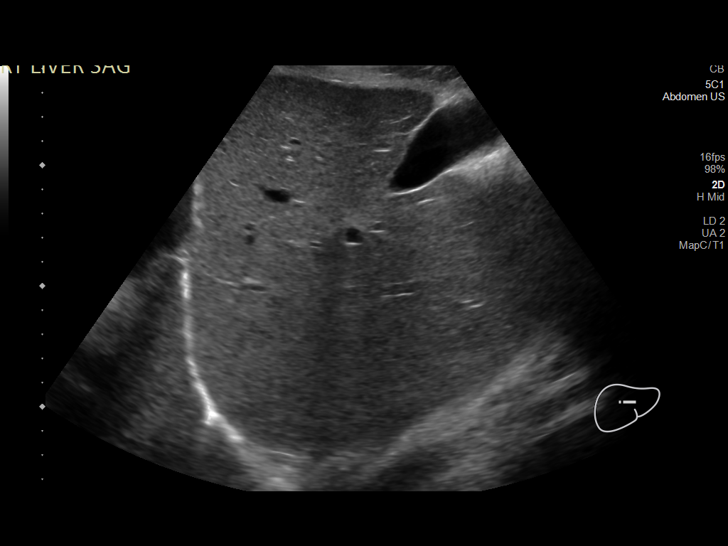
[im 60/72]
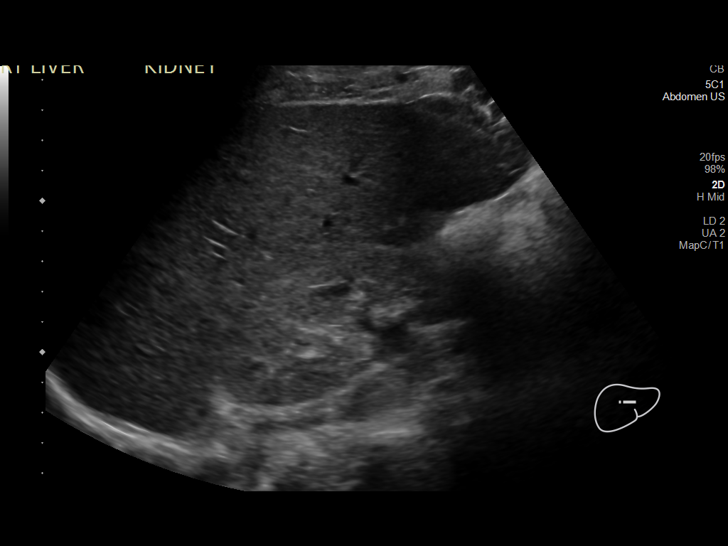
[im 66/72]
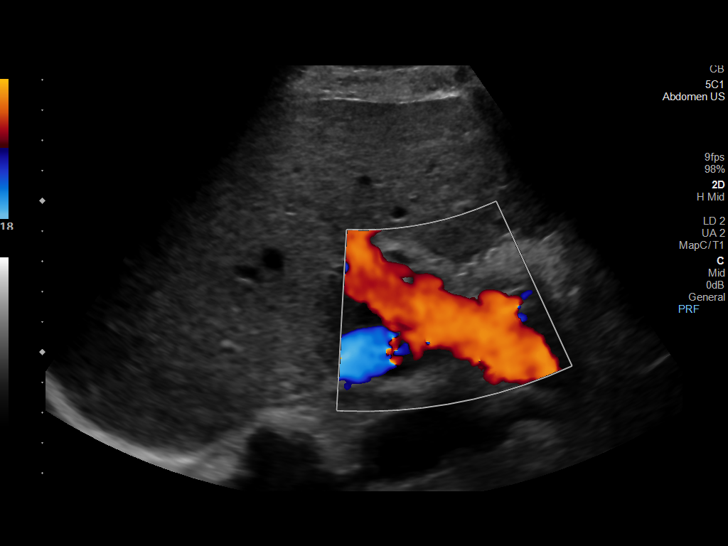
[im 72/72]
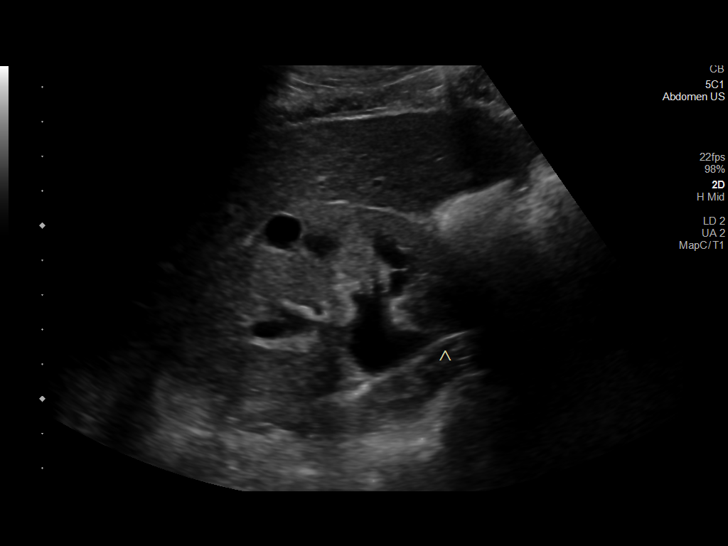

[14 of 25 positions shown; findings below may reference images not displayed]

FINDINGS: Gallbladder:

Physiologically distended. No gallstones or wall thickening
visualized. There is scattered junctional folds. No sonographic
Murphy sign noted by sonographer.

Common bile duct:

Diameter: 7 mm proximally, 3 mm distally.  No biliary dilatation.

Liver:

No focal lesion identified. Within normal limits in parenchymal
echogenicity. Portal vein is patent on color Doppler imaging with
normal direction of blood flow towards the liver.

Other: Small amount of right upper quadrant ascites which appears
diminished from prior renal ultrasound. Again seen pelvicaliectasis
of the right kidney and small right renal cyst.
IMPRESSION: 1. Unremarkable sonographic appearance of the gallbladder and liver.
2. Mild right pelvicaliectasis and small right renal cyst, unchanged
from recent renal ultrasound.
3. Small amount of right upper quadrant ascites which appears
diminished from prior renal ultrasound.

## 2020-04-04 MED ORDER — CARVEDILOL 6.25 MG PO TABS
6.2500 mg | ORAL_TABLET | Freq: Two times a day (BID) | ORAL | Status: DC
  Administered 2020-04-04 – 2020-04-05 (×3): 6.25 mg via ORAL
  Filled 2020-04-04 (×3): qty 1

## 2020-04-04 MED ORDER — CARVEDILOL 6.25 MG PO TABS: 6.2500 mg | ORAL_TABLET | Freq: Two times a day (BID) | ORAL | Status: DC

## 2020-04-04 MED ORDER — LABETALOL HCL 5 MG/ML IV SOLN: 5.0000 mg | Freq: Three times a day (TID) | INTRAVENOUS | Status: DC | PRN

## 2020-04-04 MED ORDER — HYDRALAZINE HCL 25 MG PO TABS
25.0000 mg | ORAL_TABLET | Freq: Three times a day (TID) | ORAL | Status: DC
  Administered 2020-04-04: 25 mg via ORAL
  Filled 2020-04-04: qty 1

## 2020-04-04 MED ORDER — HYDRALAZINE HCL 25 MG PO TABS
25.0000 mg | ORAL_TABLET | Freq: Four times a day (QID) | ORAL | Status: DC
  Administered 2020-04-04 – 2020-04-05 (×3): 25 mg via ORAL
  Filled 2020-04-04 (×3): qty 1

## 2020-04-04 MED ORDER — FERROUS SULFATE 325 (65 FE) MG PO TABS
325.0000 mg | ORAL_TABLET | Freq: Every day | ORAL | Status: DC
  Administered 2020-04-05: 325 mg via ORAL
  Filled 2020-04-04: qty 1

## 2020-04-04 NOTE — Progress Notes (Signed)
HD#3 Subjective:   No acute events overnight.  Patient states he feels good, "like a new person." Reports the swelling in his legs is much improved. Endorses headache from nitroglycerin which is responsive to acetaminophen. When discussing with patient his hospital course, he states it is up to him moving forward to take responsibility for his health. Reports he signed up for insurance last week but has not received any information regarding his benefits yet.  Objective:   Vital signs in last 24 hours: Vitals:   04/03/20 1830 04/03/20 2022 04/03/20 2326 04/04/20 0324  BP: (!) 192/92 (!) 188/91 (!) 179/86 (!) 153/91  Pulse: 77 81 84 92  Resp: 13 16 17 20   Temp:  98.5 F (36.9 C) 98.2 F (36.8 C) 98.5 F (36.9 C)  TempSrc:  Oral Oral Oral  SpO2: 98% 99% 99% 97%  Weight:    69.5 kg  Height:        Intake/Output Summary (Last 24 hours) at 04/04/2020 1115 Last data filed at 04/04/2020 0715 Gross per 24 hour  Intake 795 ml  Output 3100 ml  Net -2305 ml   Net IO Since Admission: -7,535.14 mL [04/04/20 1115]  Physical Exam General: Patient is sitting at his bedside, appears comfortable, in no acute distressc CV: Trace bilateral LE edema. JVD to mid neck Lungs: normal work of breathing on room air Abdominal: soft, non-tender, non-distended  Pertinent Labs: CBC Latest Ref Rng & Units 04/04/2020 04/03/2020 04/02/2020  WBC 4.0 - 10.5 K/uL 8.6 9.3 -  Hemoglobin 13.0 - 17.0 g/dL 9.3(L) 8.1(L) 7.8(L)  Hematocrit 39 - 52 % 30.5(L) 27.0(L) 25.6(L)  Platelets 150 - 400 K/uL 169 159 -    CMP Latest Ref Rng & Units 04/04/2020 04/03/2020 04/02/2020  Glucose 70 - 99 mg/dL 117(H) 120(H) 84  BUN 8 - 23 mg/dL 51(H) 50(H) 55(H)  Creatinine 0.61 - 1.24 mg/dL 3.69(H) 3.67(H) 3.70(H)  Sodium 135 - 145 mmol/L 137 137 137  Potassium 3.5 - 5.1 mmol/L 3.9 4.5 4.6  Chloride 98 - 111 mmol/L 102 106 107  CO2 22 - 32 mmol/L 25 21(L) 19(L)  Calcium 8.9 - 10.3 mg/dL 8.1(L) 8.1(L) 8.2(L)    Total Protein 6.5 - 8.1 g/dL - - -  Total Bilirubin 0.3 - 1.2 mg/dL - - -  Alkaline Phos 38 - 126 U/L - - -  AST 15 - 41 U/L - - -  ALT 0 - 44 U/L - - -     Assessment/Plan:   Active Problems:   Symptomatic anemia   Hypertensive emergency   Elevated serum creatinine   Bilateral lower extremity edema   Iron deficiency anemia   Acute heart failure ([place])   Patient Summary:  Mr. Faughn is a 64 year old male with a past medical history of diabetes and hypertension currently not on medical management who presented with symptomatic anemia and an elevated serum creatinine and admitted for evaluation and management of hypertension, acute heart failure, and acute renal failure.  This is hospital day 3.   Acute heart failure with preserved ejection fraction Acute renal failure unknown baseline Essential hypertension On evaluation today blood pressure with systolics in the 703J-009F on nitroglycerin drip on decreased rate.  On exam, bilateral lower extremity edema has improved significantly. Adequate response to IV Lasix with net output -4.3 L in the past 24 hours and -8 L this admission. Weight 69 kg from 80 kg on admission. sCr 3.67, stable from yesterday, down from 4.12 on admission. Aim to  wean off nitro drip today as pressures are better controlled. Will continue with IV Lasix 40 mg IV this morning however will reevaluate later this afternoon to decide on addition IV vs PO diuresis. Given patient's slightly reduced EF, will also start carvedilol 6.25 mg twice daily. - wean nitroglycerin drip - carvedilol 6.25 mg twice daily - IV Lasix 40 mg once this AM; evening diuresis pending reevaluation this afternoon - continue amlodipine 5 mg - strict I&Os - daily weights - trend bmp  Iron deficiency anemia FOBT positive Hemoglobin 9.3 this morning. S/p 3 units of pRBCs this admission. Patient continues to have no signs of acute blood loss. Discussed with GI who recommend outpatient follow-up.  Patient scheduled for outpatient GI follow-up on 04/18/20. Discussed iron infusion prior to discharge, however patient currently does not have insurance. Will start PO iron supplementation and plan for IV iron infusion once patient's insurance is activated.  - start ferrous sulfate 325 mg daily - trend cbc  - patient is scheduled for outpatient GI follow-up on 04/18/20  History of diabetes mellitus type 2 Hemoglobin A1c pending.  Blood glucose 116 this a.m.  Not requiring sliding scale. - Hemloglobin A1c pending - SSI  Diet: Heart healthy; carb-modified IVF: None,None VTE: SCDs Code: Full  Dispo: Anticipated discharge pending clinical improvement.  Please contact the on call pager after 5 pm and on weekends at [phone].  Alexandria Lodge, MD PGY-1 Internal Medicine Teaching Service Pager: [phone] 04/04/2020

## 2020-04-04 NOTE — Plan of Care (Signed)

## 2020-04-04 NOTE — Progress Notes (Signed)
   04/04/20 1700  Notify: Provider  Provider Name/Title Dr Candie Chroman  Date Provider Notified 04/04/20  Time Provider Notified 0505  Notification Type Call  Notification Reason Change in status  Response See new orders

## 2020-04-05 DIAGNOSIS — I161 Hypertensive emergency: Secondary | ICD-10-CM

## 2020-04-05 DIAGNOSIS — D509 Iron deficiency anemia, unspecified: Secondary | ICD-10-CM

## 2020-04-05 DIAGNOSIS — I5021 Acute systolic (congestive) heart failure: Secondary | ICD-10-CM

## 2020-04-05 DIAGNOSIS — R195 Other fecal abnormalities: Secondary | ICD-10-CM

## 2020-04-05 DIAGNOSIS — R600 Localized edema: Secondary | ICD-10-CM

## 2020-04-05 DIAGNOSIS — E119 Type 2 diabetes mellitus without complications: Secondary | ICD-10-CM

## 2020-04-05 DIAGNOSIS — I110 Hypertensive heart disease with heart failure: Principal | ICD-10-CM

## 2020-04-05 DIAGNOSIS — N179 Acute kidney failure, unspecified: Secondary | ICD-10-CM

## 2020-04-05 LAB — CBC WITH DIFFERENTIAL/PLATELET
Abs Immature Granulocytes: 0.02 10*3/uL (ref 0.00–0.07)
Basophils Absolute: 0 10*3/uL (ref 0.0–0.1)
Basophils Relative: 0 %
Eosinophils Absolute: 0.1 10*3/uL (ref 0.0–0.5)
Eosinophils Relative: 1 %
HCT: 33.9 % — ABNORMAL LOW (ref 39.0–52.0)
Hemoglobin: 10.3 g/dL — ABNORMAL LOW (ref 13.0–17.0)
Immature Granulocytes: 0 %
Lymphocytes Relative: 13 %
Lymphs Abs: 1.2 10*3/uL (ref 0.7–4.0)
MCH: 21.5 pg — ABNORMAL LOW (ref 26.0–34.0)
MCHC: 30.4 g/dL (ref 30.0–36.0)
MCV: 70.9 fL — ABNORMAL LOW (ref 80.0–100.0)
Monocytes Absolute: 1.1 10*3/uL — ABNORMAL HIGH (ref 0.1–1.0)
Monocytes Relative: 12 %
Neutro Abs: 6.8 10*3/uL (ref 1.7–7.7)
Neutrophils Relative %: 74 %
Platelets: 188 10*3/uL (ref 150–400)
RBC: 4.78 MIL/uL (ref 4.22–5.81)
RDW: 22.7 % — ABNORMAL HIGH (ref 11.5–15.5)
WBC: 9.3 10*3/uL (ref 4.0–10.5)
nRBC: 0 % (ref 0.0–0.2)

## 2020-04-05 LAB — BASIC METABOLIC PANEL
Anion gap: 9 (ref 5–15)
BUN: 57 mg/dL — ABNORMAL HIGH (ref 8–23)
CO2: 24 mmol/L (ref 22–32)
Calcium: 8.1 mg/dL — ABNORMAL LOW (ref 8.9–10.3)
Chloride: 100 mmol/L (ref 98–111)
Creatinine, Ser: 3.92 mg/dL — ABNORMAL HIGH (ref 0.61–1.24)
GFR, Estimated: 17 mL/min — ABNORMAL LOW (ref >60)
Glucose, Bld: 160 mg/dL — ABNORMAL HIGH (ref 70–99)
Potassium: 3.9 mmol/L (ref 3.5–5.1)
Sodium: 133 mmol/L — ABNORMAL LOW (ref 135–145)

## 2020-04-05 LAB — GLUCOSE, CAPILLARY
Glucose-Capillary: 102 mg/dL — ABNORMAL HIGH (ref 70–99)
Glucose-Capillary: 121 mg/dL — ABNORMAL HIGH (ref 70–99)

## 2020-04-05 LAB — PATHOLOGIST SMEAR REVIEW

## 2020-04-05 LAB — HEMOGLOBIN A1C
Hgb A1c MFr Bld: 5.6 % (ref 4.8–5.6)
Mean Plasma Glucose: 114 mg/dL

## 2020-04-05 LAB — MAGNESIUM: Magnesium: 1.8 mg/dL (ref 1.7–2.4)

## 2020-04-05 MED ORDER — ISOSORB DINITRATE-HYDRALAZINE 20-37.5 MG PO TABS
1.0000 | ORAL_TABLET | Freq: Three times a day (TID) | ORAL | Status: DC
  Administered 2020-04-05: 1 via ORAL
  Filled 2020-04-05: qty 1

## 2020-04-05 MED ORDER — FERROUS SULFATE 325 (65 FE) MG PO TABS: 325.0000 mg | ORAL_TABLET | Freq: Every day | ORAL | 0 refills | Status: DC

## 2020-04-05 MED ORDER — CARVEDILOL 6.25 MG PO TABS: 6.2500 mg | ORAL_TABLET | Freq: Two times a day (BID) | ORAL | 0 refills | Status: DC

## 2020-04-05 MED ORDER — AMLODIPINE BESYLATE 5 MG PO TABS: 5.0000 mg | ORAL_TABLET | Freq: Every day | ORAL | 0 refills | Status: DC

## 2020-04-05 MED ORDER — FUROSEMIDE 20 MG PO TABS: 20.0000 mg | ORAL_TABLET | Freq: Every day | ORAL | 0 refills | Status: DC

## 2020-04-05 MED ORDER — ISOSORB DINITRATE-HYDRALAZINE 20-37.5 MG PO TABS: 1.0000 | ORAL_TABLET | Freq: Three times a day (TID) | ORAL | 0 refills | Status: AC

## 2020-04-05 NOTE — Progress Notes (Signed)
HD#4 Subjective:   Yesterday afternoon, patient's BP trended up, highest systolic 428. Hydralazine increased in frequency from q8h to q6h, PRN labetolol added however was not required overnight. Pressures improved to 130s-140s/60s-70s overnight.  Evaluated at bedside during rounds. States he feels better. No longer has a headache. Denies shortness of breath at rest or when moving around. Has not noticed any increased swelling in his legs.   Objective:   Vital signs in last 24 hours: Vitals:   04/04/20 1944 04/04/20 2300 04/05/20 0400 04/05/20 0500  BP: (!) 177/97 134/79 131/64 (!) 145/69  Pulse:  79 80 80  Resp: (!) 23 17 15 14   Temp: 97.7 F (36.5 C) 97.9 F (36.6 C) 97.8 F (36.6 C)   TempSrc: Oral Oral Oral   SpO2: 98% 99% 99% 99%  Weight:    67.3 kg  Height:        Intake/Output Summary (Last 24 hours) at 04/05/2020 0554 Last data filed at 04/04/2020 1800 Gross per 24 hour  Intake 915 ml  Output 3100 ml  Net -2185 ml   Net IO Since Admission: -10,035.14 mL [04/05/20 0554]  Filed Weights   04/02/20 0335 04/04/20 0324 04/05/20 0500  Weight: 77 kg 69.5 kg 67.3 kg   Physical Exam General: Patient is sitting in bed, appears comfortable, in no acute distressc CV: No LE edema. No apparent JVD at 45 degrees Lungs: normal work of breathing on room air, lungs clear to auscultation bilaterally Abdominal: soft, non-distended  Pertinent Labs: CBC Latest Ref Rng & Units 04/05/2020 04/04/2020 04/03/2020  WBC 4.0 - 10.5 K/uL 9.3 8.6 9.3  Hemoglobin 13.0 - 17.0 g/dL 10.3(L) 9.3(L) 8.1(L)  Hematocrit 39 - 52 % 33.9(L) 30.5(L) 27.0(L)  Platelets 150 - 400 K/uL 188 169 159    CMP Latest Ref Rng & Units 04/05/2020 04/04/2020 04/03/2020  Glucose 70 - 99 mg/dL 160(H) 117(H) 120(H)  BUN 8 - 23 mg/dL 57(H) 51(H) 50(H)  Creatinine 0.61 - 1.24 mg/dL 3.92(H) 3.69(H) 3.67(H)  Sodium 135 - 145 mmol/L 133(L) 137 137  Potassium 3.5 - 5.1 mmol/L 3.9 3.9 4.5  Chloride 98 - 111  mmol/L 100 102 106  CO2 22 - 32 mmol/L 24 25 21(L)  Calcium 8.9 - 10.3 mg/dL 8.1(L) 8.1(L) 8.1(L)  Total Protein 6.5 - 8.1 g/dL - - -  Total Bilirubin 0.3 - 1.2 mg/dL - - -  Alkaline Phos 38 - 126 U/L - - -  AST 15 - 41 U/L - - -  ALT 0 - 44 U/L - - -     Assessment/Plan:   Active Problems:   Symptomatic anemia   Hypertensive emergency   Acute kidney injury ([place])   Bilateral lower extremity edema   Iron deficiency anemia   Acute heart failure with reduced ejection fraction and diastolic dysfunction ([facility])   LVH (left ventricular hypertrophy)   Patient Summary:  Jeffrey Zuniga is a 64 year old male with a past medical history of diabetes and hypertension currently not on medical management who presented with symptomatic anemia and an elevated serum creatinine and admitted for evaluation and management of hypertension, acute heart failure, and acute renal failure.  This is hospital day 4.   Acute heart failure with slightly reduced ejection fraction (EF 45-50%) Acute renal failure unknown baseline Essential hypertension Yesterday, patient received 40 mg IV Lasix in the AM however held afternoon dose as he appeared euvolemic at that time. Euvolemic on exam this morning. Net output -2.2 L in the past 24  hours and -10 L this admission. Weight 67 kg from 80 kg on admission. Nitro drip discontinued yesterday, and patient was initiated on hydralazine 25 mg q8h. Later in the afternoon his BP remained elevated with systolics in the low 219X so frequency of hydralazine increased to q6h with improvement of BP to systolic [id number]. sCr increased to 3.92 from 3.67 yesterday, still down from 4.12 on admission. Will hold off on further diuresis today given bump in creatinine. Will discontinue hydralazine and start patient on BiDil given his HF with slightly reduced EF. Continue carvedilol 6.25 mg twice daily as below. If patient is able to tolerate BiDil, could consider discharge home later this  afternoon. Patient will likely need referral to nephrology in the outpatient setting. - BiDil 20-37.5 1 tab three times daily - carvedilol 6.25 mg twice daily - continue amlodipine 5 mg - strict I&Os - daily weights - trend bmp  Iron deficiency anemia FOBT positive Hemoglobin 10.3 this morning. S/p 3 units of pRBCs this admission. Patient continues to have no signs of acute blood loss. Patient scheduled for outpatient GI follow-up on 04/18/20 with [place], though may schedule with [facility] since his PCP is there . Discussed iron infusion prior to discharge, however patient currently does not have insurance. Will start PO iron supplementation and plan for IV iron infusion once patient's insurance is activated.  - continue ferrous sulfate 325 mg daily - trend cbc  - patient is scheduled for outpatient GI follow-up on 04/18/20  History of diabetes mellitus type 2 Pre-diabetes Hemoglobin A1c 6.1% on admission. We have had him on SSI given his reported history, though he has not required insulin. - SSI  Diet: Heart healthy; carb-modified IVF: None,None VTE: SCDs Code: Full  Dispo: Anticipated discharge today or tomorrow pending titration of his BP medications.  Please contact the on call pager after 5 pm and on weekends at [phone].  Jeffrey Lodge, MD PGY-1 Internal Medicine Teaching Service Pager: [phone] 04/05/2020

## 2020-04-05 NOTE — Discharge Instructions (Signed)
Mr. Jeffrey Zuniga,   It was a pleasure taking care of you. You were admitted to the hospital and treated for elevated blood pressure (hypertension), which we believe caused your heart failure and kidney dysfunction, as well as iron-deficiency anemia (low blood counts).   The treatment for your blood pressure and heart failure are overlapping. The following medications were added/changed: - amlodipine 5 mg daily (this is a blood pressure lowering medication) - start tomorrow - carvedilol 6.25 mg twice daily (this is a heart medication which also lowers your blood pressure) - take one dose tonight - isosorbide-hydralazine (BiDil) 20-37.5 mg, 1 tablet three times daily (this is a heart medication which also lowers your blood pressure) - take one dose tonight - furosemide (Lasix) 20 mg once daily - start tomorrow - For your reference, your weight on the day of discharge was 67 kg (148 pounds).  For your iron deficiency anemia, you received 3 units of blood while hospitalized. We also started you on a daily iron supplement. Your kidney disease can also lower your blood counts. Your hemoglobin level was 10.3 on the day of discharge (up from 5.3 on admission). - ferrous sulfate 325 mg tablet once daily - start tomorrow  In order to evaluate you for diabetes, we measured your hemoglobin A1c level. It was technically in the pre-diabetes range at 6.1%. We will leave it up to your primary care doctor to decide on management. For now, do not take your metformin.   We would like for you to keep your previously scheduled appointment with your primary care physician for tomorrow, 04/06/20.  Take care!

## 2020-04-05 NOTE — Discharge Summary (Signed)
Name: Jeffrey Zuniga MRN: [id number] DOB: 1956/01/21 64 y.o. PCP: Patient, No Pcp Per  Date of Admission: 04/01/2020  3:41 PM Date of Discharge: 04/05/20 Attending Physician: Dr. Aldine Contes  Discharge Diagnosis:  1. Symptomatic anemia, iron deficiency anemia 2. Hypertensive emergency 3. Acute renal failure 4. 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 Pre-diabetes  Discharge Medications: Allergies as of 04/05/2020   No Known Allergies     Medication List    STOP taking these medications   metFORMIN 500 MG tablet Commonly known as: GLUCOPHAGE     TAKE these medications   amLODipine 5 MG tablet Commonly known as: NORVASC Take 1 tablet (5 mg total) by mouth daily. Start taking on: April 06, 2020   carvedilol 6.25 MG tablet Commonly known as: COREG Take 1 tablet (6.25 mg total) by mouth 2 (two) times daily with a meal.   ferrous sulfate 325 (65 FE) MG tablet Take 1 tablet (325 mg total) by mouth daily with breakfast. Start taking on: April 06, 2020   furosemide 20 MG tablet Commonly known as: Lasix Take 1 tablet (20 mg total) by mouth daily. What changed:   medication strength  how much to take   HYDROcodone-acetaminophen 5-325 MG tablet Commonly known as: NORCO/VICODIN Take 1 tablet by mouth daily in the afternoon.   isosorbide-hydrALAZINE 20-37.5 MG tablet Commonly known as: BIDIL Take 1 tablet by mouth 3 (three) times daily.   latanoprost 0.005 % ophthalmic solution Commonly known as: XALATAN Place 1 drop into both eyes at bedtime.       Disposition and follow-up:   JeffreyJeffrey Zuniga was discharged from [facility] in Good condition.  At the hospital follow up visit please address:  1.    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 Hypertensive heart disease is more likely given the patient's likely longstanding uncontrolled hypertension, though would consider amyloidosis work-up.  Acute renal failure - Serum creatinine on admission 4.12 with unknown baseline. Serum creatinine at discharge 3.92. - Suspect hypertensive nephropathy; renal ultrasound showed medical renal disease - Recommend close outpatient follow-up with nephrology  Iron deficiency anemia FOBT positive - Hemoglobin on admission 5.3 - Hemoglobin at discharge 10.3 after 3 u pRBCs and initiation of ferrous sulfate 325 mg daily. Consider IV iron infusion once patient has insurance. - Last colonoscopy >10 years ago. No signs or symptoms of bleeding. - Given patient's age and FOBT positive, concern for GI malignancy. GI curbsided during this admission and recommend outpatient follow which has been scheduled with [facility] on 04/18/20. Consider follow-up with [facility] for ease of follow-up.   History of diabetes mellitus type 2  Pre-diabetes - Patient reported history of DM2. A1c 6.1% this admission. - Counsel regarding lifestyle modifications  2.  Labs / imaging needed at time of follow-up: CBC, BMP  3.  Pending labs/ test needing follow-up: none  Follow-up Appointments:  Follow-up Information    [facility]. Go in 1 day(s).   Why: Keep your regularly scheduled appointment with your primary care doctor for tomorrow, 04/06/20.  Hospital Course by problem list:  Mr. Jeffrey Zuniga is a 64 year old gentleman with a past medical history of diabetes and hypertension currently not on medical management who presented to [facility] ED with symptomatic anemia  and an elevated serum creatinine and was admitted for evaluation and management of hypertension, acute heart failure, and acute renal failure.  Acute heart failure with slightly reduced ejection fraction (EF 45-50%) Acute renal failure unknown baseline Essential hypertension Cr on admission 4.12 with unknown baseline. Patient also with 2+ pitting edema in his bilateral lower extremities as well as JVD to the jaw. BNP elevated at 1,763. Unclear chronicity given patient's hypertension and diabetes.  Renal ultrasound showed mild increased echogenicity in the kidneys consistent with early medical renal disease as well as nonspecific right renal cyst. On admission, BP uncontrolled with systolics [id number] on nitroglycerin drip.  Echocardiogram demonstrated mildly reduced LVEF at 45-50% with severe concentric left ventricular hypertrophy, grade II diastolic dysfunction, elevated left atrial pressures, moderately elevated pulmonary artery systolic pressures, severe LA dilatation. Findings most consistent with hypertensive cardiomyopathy. Patient was diuresed with IV Lasix 40 mg twice daily with resolution of lower extremity edema and JVD. Net negative 10 L this admission with 13 kg weight loss. He was able to be weaned off the nitroglycerin drip and by discharge was on amlodipine 5 mg daily, BiDil 20-37.5 1 tablet three times daily, carvedilol 6.25 mg twice daily, and furosemide 20 mg daily.  and continue nitroglycerin drip though try to wean as pressures improve. His serum creatinine improved to 3.67 however bumped to 3.92 on discharge, suspect secondary to overdiuresis. Patient will need close follow-up with outpatient nephrology.  Iron deficiency anemia FOBT positive Mr. Jeffrey Zuniga presented with a Hgb of 5.3, MCV 68 consistent with microcytic anemia. Unclear chronicity.  Reported symptoms of shortness of breath and fatigue for approximately 1 week.  No evidence of large acute or active bleed.  Point-of-care fecal  occult blood test positive.  Denied heavy use of NSAIDs. Iron studies consistent with iron deficiency anemia. He was transfused 3 units pRBCs and initiated on oral iron supplementation. Hemoglobin 10.3 at discharge. Given patient's age and FOBT positive, concern for GI malignancy. GI curbsided during this admission and recommend outpatient follow which has been scheduled with [facility] on 04/18/20. Consider follow-up with [place] [facility] for ease of follow-up. Consider IV iron infusion once patient has insurance.  History of diabetes mellitus type 2 Pre-diabetes Patient reported a history of diabetes. A1c 6.1% this admission. He was placed on SSI however did not require insulin.   Discharge Vitals:   BP 129/85 (BP Location: Right Arm)   Pulse 79   Temp 97.9 F (36.6 C) (Oral)   Resp 18   Ht 5\' 7"  (1.702 m)   Wt 67.3 kg   SpO2 97%   BMI 23.24 kg/m   Pertinent Labs, Studies, and Procedures:   04/05/20 ULTRASOUND ABDOMEN LIMITED RIGHT UPPER QUADRANT  FINDINGS: Gallbladder:  Physiologically distended. No gallstones or wall thickening visualized. There is scattered junctional folds. No sonographic Murphy sign noted by sonographer.  Common bile duct:  Diameter: 7 mm proximally, 3 mm distally.  No biliary dilatation.  Liver:  No focal lesion identified. Within normal limits in parenchymal echogenicity. Portal vein is patent on color Doppler imaging with normal direction of blood flow towards the liver.  Other: Small amount of right upper quadrant ascites which appears diminished from prior renal ultrasound. Again seen pelvicaliectasis of the right kidney and small right renal cyst.  IMPRESSION: 1. Unremarkable sonographic appearance of the gallbladder and liver. 2. Mild right pelvicaliectasis and small right renal cyst, unchanged from recent renal ultrasound. 3. Small amount of right upper quadrant ascites which appears diminished from prior renal  ultrasound.   Electronically Signed   By: Keith Rake M.D.   On: 04/04/2020 21:21  US Renal  Result Date: 04/01/2020 CLINICAL DATA:  Acute renal injury EXAM: RENAL / URINARY TRACT ULTRASOUND COMPLETE COMPARISON:  None. FINDINGS: Right Kidney: Renal measurements: 10.3 x 5.0 x 5.9 cm. = volume: 158 mL. 1.1 cm cyst is noted in the midportion of the renal cortex on the right. Mild increased echogenicity is noted. Left Kidney: Renal measurements: 9.9 x 5.0 x 5.5 cm. = volume: 141 mL. Echogenicity within normal limits. No mass or hydronephrosis visualized. Mild increased echogenicity is noted. Bladder: Decompressed Other: None. IMPRESSION: Right renal cyst.  No acute abnormality is noted. Mild increased echogenicity in the kidneys consistent with early medical renal disease. Electronically Signed   By: Inez Catalina M.D.   On: 04/01/2020 18:37   DG Chest Port 1 View  Result Date: 04/02/2020 CLINICAL DATA:  Shortness of breath EXAM: PORTABLE CHEST 1 VIEW COMPARISON:  None. FINDINGS: Cardiomegaly. There is no edema, consolidation, effusion, or pneumothorax. Degenerative endplate spurring. IMPRESSION: Cardiomegaly without failure. Electronically Signed   By: Monte Fantasia M.D.   On: 04/02/2020 08:50   ECHOCARDIOGRAM COMPLETE  Result Date: 04/02/2020    ECHOCARDIOGRAM REPORT   Patient Name:   Radford Pickar Date of Exam: 04/02/2020 Medical Rec #:  [id number]    Height:       67.0 in Accession #:    [id number]   Weight:       169.8 lb Date of Birth:  06-15-1955    BSA:          1.886 m Patient Age:    57 years     BP:           184/91 mmHg Patient Gender: M            HR:           74 bpm. Exam Location:  Inpatient Procedure: 2D Echo, 3D Echo, Cardiac Doppler, Color Doppler and Strain Analysis Indications:    R60.0 Lower extremity edema; R06.02 SOB  History:        Patient has no prior history of Echocardiogram examinations.                 CHF, Signs/Symptoms:Dyspnea and Murmur; Risk                  Factors:Hypertension and Diabetes.  Sonographer:    Roseanna Rainbow RDCS Referring Phys: [id number] DAN FLOYD  Sonographer Comments: Global longitudinal strain was attempted. IMPRESSIONS  1. EF mildly reduced, 45-50% with severe concentric LVH. Abnormal strain imaging with apical sparing which can be seen in cardiac amyloidosis. Review of chart demonstrates severe hypertension with poor control, which makes hypertensive heart disease more likely. Clinical correlation is recommended. Left ventricular ejection fraction, by estimation, is 45 to 50%. Left ventricular ejection fraction by 3D volume is 49 %. The left ventricle has mildly decreased function. The left ventricle demonstrates global hypokinesis. There is severe concentric left ventricular hypertrophy. Left ventricular diastolic parameters are consistent with Grade II diastolic dysfunction (pseudonormalization). Elevated left atrial pressure. The average left ventricular global longitudinal strain is -11.3 %. The global longitudinal strain is abnormal.  2. Right ventricular systolic function is normal. The right ventricular  size is mildly enlarged. There is moderately elevated pulmonary artery systolic pressure. The estimated right ventricular systolic pressure is 79.0 mmHg.  3. Left atrial size was severely dilated.  4. Right atrial size was moderately dilated.  5. The mitral valve is grossly normal. Trivial mitral valve regurgitation. No evidence of mitral stenosis.  6. The aortic valve is tricuspid. Aortic valve regurgitation is trivial. No aortic stenosis is present.  7. The inferior vena cava is dilated in size with <50% respiratory variability, suggesting right atrial pressure of 15 mmHg. FINDINGS  Left Ventricle: EF mildly reduced, 45-50% with severe concentric LVH. Abnormal strain imaging with apical sparing which can be seen in cardiac amyloidosis. Review of chart demonstrates severe hypertension with poor control, which makes hypertensive heart disease more  likely. Clinical correlation is recommended. Left ventricular ejection fraction, by estimation, is 45 to 50%. Left ventricular ejection fraction by 3D volume is 49 %. The left ventricle has mildly decreased function. The left ventricle  demonstrates global hypokinesis. The average left ventricular global longitudinal strain is -11.3 %. The global longitudinal strain is abnormal. The left ventricular internal cavity size was normal in size. There is severe concentric left ventricular hypertrophy. Left ventricular diastolic parameters are consistent with Grade II diastolic dysfunction (pseudonormalization). Elevated left atrial pressure. Right Ventricle: The right ventricular size is mildly enlarged. No increase in right ventricular wall thickness. Right ventricular systolic function is normal. There is moderately elevated pulmonary artery systolic pressure. The tricuspid regurgitant velocity is 3.19 m/s, and with an assumed right atrial pressure of 15 mmHg, the estimated right ventricular systolic pressure is 24.0 mmHg. Left Atrium: Left atrial size was severely dilated. Right Atrium: Right atrial size was moderately dilated. Pericardium: Trivial pericardial effusion is present. Mitral Valve: The mitral valve is grossly normal. Trivial mitral valve regurgitation. No evidence of mitral valve stenosis. MV peak gradient, 10.9 mmHg. The mean mitral valve gradient is 3.0 mmHg. Tricuspid Valve: The tricuspid valve is grossly normal. Tricuspid valve regurgitation is mild . No evidence of tricuspid stenosis. Aortic Valve: The aortic valve is tricuspid. Aortic valve regurgitation is trivial. No aortic stenosis is present. Pulmonic Valve: The pulmonic valve was grossly normal. Pulmonic valve regurgitation is trivial. No evidence of pulmonic stenosis. Aorta: The aortic root and ascending aorta are structurally normal, with no evidence of dilitation. Venous: The inferior vena cava is dilated in size with less than 50%  respiratory variability, suggesting right atrial pressure of 15 mmHg. IAS/Shunts: The atrial septum is grossly normal.  LEFT VENTRICLE PLAX 2D LVIDd:         4.90 cm         Diastology LVIDs:         4.00 cm         LV e' medial:    5.98 cm/s LV PW:         1.60 cm         LV E/e' medial:  24.4 LV IVS:        1.52 cm         LV e' lateral:   5.22 cm/s LVOT diam:     2.00 cm         LV E/e' lateral: 28.0 LV SV:         99 LV SV Index:   52              2D LVOT Area:     3.14 cm        Longitudinal  Strain                                2D Strain GLS  -11.3 % LV Volumes (MOD)               Avg: LV vol d, MOD    121.0 ml A2C:                           3D Volume EF LV vol d, MOD    150.0 ml      LV 3D EF:    Left A4C:                                        ventricular LV vol s, MOD    56.1 ml                    ejection A2C:                                        fraction by LV vol s, MOD    79.7 ml                    3D volume A4C:                                        is 49 %. LV SV MOD A2C:   64.9 ml LV SV MOD A4C:   150.0 ml LV SV MOD BP:    67.7 ml       3D Volume EF:                                3D EF:        49 % RIGHT VENTRICLE             IVC RV S prime:     11.30 cm/s  IVC diam: 2.60 cm TAPSE (M-mode): 2.6 cm LEFT ATRIUM             Index       RIGHT ATRIUM           Index LA diam:        4.00 cm 2.12 cm/m  RA Area:     26.70 cm LA Vol (A2C):   41.5 ml 22.00 ml/m RA Volume:   99.00 ml  52.49 ml/m LA Vol (A4C):   91.5 ml 48.51 ml/m LA Biplane Vol: 62.8 ml 33.30 ml/m  AORTIC VALVE              PULMONIC VALVE LVOT Vmax:   139.00 cm/s  PR End Diast Vel: 1.84 msec LVOT Vmean:  104.000 cm/s LVOT VTI:    0.315 m  AORTA Ao Root diam: 3.40 cm Ao Asc diam:  3.40 cm MITRAL VALVE                TRICUSPID VALVE MV Area (PHT): 5.84 cm     TR Peak grad:   40.7 mmHg MV Peak grad:  10.9 mmHg  TR Vmax:        319.00 cm/s MV Mean grad:  3.0 mmHg MV Vmax:       1.65 m/s     SHUNTS MV  Vmean:      82.2 cm/s    Systemic VTI:  0.32 m MV Decel Time: 130 msec     Systemic Diam: 2.00 cm MV E velocity: 146.00 cm/s MV A velocity: 94.70 cm/s MV E/A ratio:  1.54 Eleonore Chiquito MD Electronically signed by Eleonore Chiquito MD Signature Date/Time: 04/02/2020/1:13:23 PM    Final     Discharge Instructions: Discharge Instructions    ([place]) Call MD:  Anytime you have any of the following symptoms: 1) 3 pound weight gain in 24 hours or 5 pounds in 1 week 2) shortness of breath, with or without a dry hacking cough 3) swelling in the hands, feet or stomach 4) if you have to sleep on extra pillows at night in order to breathe.   Complete by: As directed    Call MD for:  extreme fatigue   Complete by: As directed    Call MD for:  persistant dizziness or light-headedness   Complete by: As directed    Diet - low sodium heart healthy   Complete by: As directed    Increase activity slowly   Complete by: As directed      Jeffrey Zuniga,   It was a pleasure taking care of you. You were admitted to the hospital and treated for elevated blood pressure (hypertension), which we believe caused your heart failure and kidney dysfunction, as well as iron-deficiency anemia (low blood counts).   The treatment for your blood pressure and heart failure are overlapping. The following medications were added/changed: - amlodipine 5 mg daily (this is a blood pressure lowering medication) - start tomorrow - carvedilol 6.25 mg twice daily (this is a heart medication which also lowers your blood pressure) - take one dose tonight - isosorbide-hydralazine (BiDil) 20-37.5 mg, 1 tablet three times daily (this is a heart medication which also lowers your blood pressure) - take one dose tonight - furosemide (Lasix) 20 mg once daily - start tomorrow - For your reference, your weight on the day of discharge was 67 kg (148 pounds).  For your iron deficiency anemia, you received 3 units of blood while hospitalized.  We also started you on a daily iron supplement. Your kidney disease can also lower your blood counts. Your hemoglobin level was 10.3 on the day of discharge (up from 5.3 on admission). - ferrous sulfate 325 mg tablet once daily - start tomorrow  In order to evaluate you for diabetes, we measured your hemoglobin A1c level. It was technically in the pre-diabetes range at 6.1%. We will leave it up to your primary care doctor to decide on management. For now, do not take your metformin.   We would like for you to keep your previously scheduled appointment with your primary care physician for tomorrow, 04/06/20.  Take care!  Signed: Alexandria Lodge, MD 04/05/2020, 2:40 PM   Pager: [phone]

## 2020-04-18 ENCOUNTER — Ambulatory Visit: Payer: Self-pay | Admitting: Physician Assistant

## 2020-05-02 ENCOUNTER — Other Ambulatory Visit (INDEPENDENT_AMBULATORY_CARE_PROVIDER_SITE_OTHER): Payer: Self-pay

## 2020-05-02 ENCOUNTER — Ambulatory Visit (INDEPENDENT_AMBULATORY_CARE_PROVIDER_SITE_OTHER): Payer: Self-pay | Admitting: Physician Assistant

## 2020-05-02 ENCOUNTER — Encounter: Payer: Self-pay | Admitting: Physician Assistant

## 2020-05-02 VITALS — BP 172/90 | HR 76 | Ht 67.0 in | Wt 159.0 lb

## 2020-05-02 DIAGNOSIS — D509 Iron deficiency anemia, unspecified: Secondary | ICD-10-CM

## 2020-05-02 LAB — CBC WITH DIFFERENTIAL/PLATELET
Basophils Absolute: 0 10*3/uL (ref 0.0–0.1)
Basophils Relative: 0.3 % (ref 0.0–3.0)
Eosinophils Absolute: 0.1 10*3/uL (ref 0.0–0.7)
Eosinophils Relative: 1.6 % (ref 0.0–5.0)
HCT: 26.8 % — ABNORMAL LOW (ref 39.0–52.0)
Hemoglobin: 8.6 g/dL — ABNORMAL LOW (ref 13.0–17.0)
Lymphocytes Relative: 19.8 % (ref 12.0–46.0)
Lymphs Abs: 1.5 10*3/uL (ref 0.7–4.0)
MCHC: 32.1 g/dL (ref 30.0–36.0)
MCV: 70.8 fl — ABNORMAL LOW (ref 78.0–100.0)
Monocytes Absolute: 0.6 10*3/uL (ref 0.1–1.0)
Monocytes Relative: 7.9 % (ref 3.0–12.0)
Neutro Abs: 5.4 10*3/uL (ref 1.4–7.7)
Neutrophils Relative %: 70.4 % (ref 43.0–77.0)
Platelets: 146 10*3/uL — ABNORMAL LOW (ref 150.0–400.0)
RBC: 3.79 Mil/uL — ABNORMAL LOW (ref 4.22–5.81)
RDW: 30.2 % — ABNORMAL HIGH (ref 11.5–15.5)
WBC: 7.7 10*3/uL (ref 4.0–10.5)

## 2020-05-02 MED ORDER — SUTAB 1479-225-188 MG PO TABS: 1.0000 | ORAL_TABLET | ORAL | 0 refills | Status: DC

## 2020-05-02 MED ORDER — FERROUS SULFATE 325 (65 FE) MG PO TABS: 325.0000 mg | ORAL_TABLET | Freq: Two times a day (BID) | ORAL | 2 refills | Status: DC

## 2020-05-02 NOTE — Progress Notes (Signed)
Addendum: Reviewed and agree with assessment and management plan. Maryalyce Sanjuan M, MD  

## 2020-05-02 NOTE — Patient Instructions (Signed)
If you are age 62 or older, your body mass index should be between 23-30. Your Body mass index is 24.9 kg/m. If this is out of the aforementioned range listed, please consider follow up with your Primary Care Provider.  If you are age 90 or younger, your body mass index should be between 19-25. Your Body mass index is 24.9 kg/m. If this is out of the aformentioned range listed, please consider follow up with your Primary Care Provider.   Your provider has requested that you go to the basement level for lab work before leaving today. Press "B" on the elevator. The lab is located at the first door on the left as you exit the elevator.  You have been scheduled for an endoscopy and colonoscopy. Please follow the written instructions given to you at your visit today. Please pick up your prep supplies at the pharmacy within the next 1-3 days. If you use inhalers (even only as needed), please bring them with you on the day of your procedure.  INCREASE your IRON to twice daily.  Follow up pending at this time.

## 2020-05-02 NOTE — Progress Notes (Signed)
Subjective:    Patient ID: Jeffrey Zuniga, male    DOB: 09-06-1955, 64 y.o.   MRN: [id number]  HPI Raymond is a pleasant 64 year old African-American male, new to GI today referred by the [facility] teaching service/Narenda MD for evaluation of iron deficiency anemia. Patient relates that he did have 1 prior colonoscopy around age 64 while he was living in [place] and was told that was negative and to follow-up in 10 years. Patient had recent hospitalization 10/22 through 04/05/2020 when he presented with fatigue and shortness of breath and was found to have hemoglobin of 5.2, MCV in the 70 range and ferritin 8/TIBC 384/serum iron 25 and iron saturation of 7. He was Hemoccult positive. He also had evidence of acute versus chronic kidney injury and volume overload on admit.  BNP was 1700.  He had echo done which showed EF of 45 to 50%.  He had diuresis.  He was transfused a total of 3 units of packed RBCs and on discharge hemoglobin was 10.3.  He was discharged on ferrous sulfate 325 mg p.o. once daily. Discharge Creatinine Was 3.92.  Patient Had Not Seen a Physician for Several Years Prior to This Most Recent Hospitalization so Hard to Determine His Baseline. He Does Not Use Any Regular Aspirin or NSAIDs, Family History Negative for Colon Cancer As Far As He Is Aware. He Does Drink Alcohol Approximately 2 Drinks per Day.  He Denies Any Heartburn or Indigestion, No Dysphagia.  No Complaints of Abdominal Pain No Changes in Bowel Habits.  He Has Not Noted Any Melena or Hematochezia since Discharge. He Says His Fatigue Is Much Improved and He Is Not Having Any Shortness of Breath.  Review of Systems Pertinent positive and negative review of systems were noted in the above HPI section.  All other review of systems was otherwise negative.  Outpatient Encounter Medications as of 05/02/2020  Medication Sig  . amLODipine (NORVASC) 5 MG tablet Take 1 tablet (5 mg total) by mouth daily.  . carvedilol (COREG)  6.25 MG tablet Take 1 tablet (6.25 mg total) by mouth 2 (two) times daily with a meal.  . ferrous sulfate 325 (65 FE) MG tablet Take 1 tablet (325 mg total) by mouth 2 (two) times daily with a meal.  . isosorbide-hydrALAZINE (BIDIL) 20-37.5 MG tablet Take 1 tablet by mouth 3 (three) times daily.  Marland Kitchen latanoprost (XALATAN) 0.005 % ophthalmic solution Place 1 drop into both eyes at bedtime.  . [DISCONTINUED] ferrous sulfate 325 (65 FE) MG tablet Take 1 tablet (325 mg total) by mouth daily with breakfast.  . furosemide (LASIX) 20 MG tablet Take 1 tablet (20 mg total) by mouth daily. (Patient not taking: Reported on 05/02/2020)  . Sodium Sulfate-Mag Sulfate-KCl (SUTAB) [phone] MG TABS Take 1 kit by mouth as directed. MANUFACTURER CODES!! BIN: [id number] PCN: CN GROUP: [id number] MEMBER ID: [id number];DIY AS SECONDARY INSURANCE ;NO PRIOR AUTHORIZATION  . [DISCONTINUED] HYDROcodone-acetaminophen (NORCO/VICODIN) 5-325 MG tablet Take 1 tablet by mouth daily in the afternoon.   No facility-administered encounter medications on file as of 05/02/2020.   No Known Allergies Patient Active Problem List   Diagnosis Date Noted  . LVH (left ventricular hypertrophy) 04/04/2020  . Iron deficiency anemia 04/02/2020  . Acute heart failure with reduced ejection fraction and diastolic dysfunction ([place]) 04/02/2020  . Symptomatic anemia 04/01/2020  . Hypertensive emergency 04/01/2020  . Acute kidney injury ([place]) 04/01/2020  . Bilateral lower extremity edema 04/01/2020   Social History   Socioeconomic History  .  Marital status: Single    Spouse name: Not on file  . Number of children: Not on file  . Years of education: Not on file  . Highest education level: Not on file  Occupational History  . Not on file  Tobacco Use  . Smoking status: Current Every Day Smoker    Types: Cigarettes  . Smokeless tobacco: Never Used  . Tobacco comment: 2021  " i AM CUTTING BACK :"  Vaping Use  . Vaping Use: Never used    Substance and Sexual Activity  . Alcohol use: Not Currently  . Drug use: Never  . Sexual activity: Not on file  Other Topics Concern  . Not on file  Social History Narrative  . Not on file   Social Determinants of Health   Financial Resource Strain:   . Difficulty of Paying Living Expenses: Not on file  Food Insecurity:   . Worried About Charity fundraiser in the Last Year: Not on file  . Ran Out of Food in the Last Year: Not on file  Transportation Needs:   . Lack of Transportation (Medical): Not on file  . Lack of Transportation (Non-Medical): Not on file  Physical Activity:   . Days of Exercise per Week: Not on file  . Minutes of Exercise per Session: Not on file  Stress:   . Feeling of Stress : Not on file  Social Connections:   . Frequency of Communication with Friends and Family: Not on file  . Frequency of Social Gatherings with Friends and Family: Not on file  . Attends Religious Services: Not on file  . Active Member of Clubs or Organizations: Not on file  . Attends Archivist Meetings: Not on file  . Marital Status: Not on file  Intimate Partner Violence:   . Fear of Current or Ex-Partner: Not on file  . Emotionally Abused: Not on file  . Physically Abused: Not on file  . Sexually Abused: Not on file    Mr. Montanye family history is not on file.      Objective:    Vitals:   05/02/20 1343  BP: (!) 172/90  Pulse: 76    Physical Exam Well-developed well-nourished older AA male in no acute distress.  Height, Weight,159 BMI 24.9  HEENT; nontraumatic normocephalic, EOMI, PE RR LA, sclera anicteric. Oropharynx; not examined Neck; supple, no JVD Cardiovascular; regular rate and rhythm with S1-S2, no murmur rub or gallop Pulmonary; Clear bilaterally Abdomen; soft, nontender, nondistended, no palpable mass or hepatosplenomegaly, bowel sounds are active Rectal; not done today Skin; benign exam, no jaundice rash or appreciable  lesions Extremities; no clubbing cyanosis or edema skin warm and dry Neuro/Psych; alert and oriented x4, grossly nonfocal mood and affect appropriate       Assessment & Plan:   #66 64 year old African-American male with new finding of severe  iron deficiency anemia and heme positive stool with recent hospitalization for same 10/22 through 04/05/2020.  Hemoglobin 5.2 on admission He did not have GI evaluation during his hospitalization.  He was transfused 3 units of packed RBCs and started on oral iron.  Rule out occult upper versus lower GI neoplasm, chronic gastropathy, AVMs  Patient did have screening colonoscopy at age 81 per patient report negative  #2 fatigue secondary to above much improved #3 congestive heart failure-EF 45 to 50% recent diuresis #4 chronic kidney disease #5 hypertension  Plan; CBC today Increase ferrous sulfate to 325 mg p.o. twice daily Patient will  be scheduled for colonoscopy and EGD with Dr. Hilarie Fredrickson.  Procedures were discussed in detail with patient including indications risks and benefits and he is agreeable to proceed. He has completed COVID-19 vaccination. He will need repeat iron studies in about 4 months and will need to continue to trend hemoglobin. Further recommendations pending findings at endoscopic evaluation.  Plan  Bryann Mcnealy Genia Harold PA-C 05/02/2020   Cc: No ref. provider found

## 2020-05-09 ENCOUNTER — Other Ambulatory Visit: Payer: Self-pay

## 2020-05-09 DIAGNOSIS — D509 Iron deficiency anemia, unspecified: Secondary | ICD-10-CM

## 2020-06-29 ENCOUNTER — Encounter: Payer: Self-pay | Admitting: Internal Medicine

## 2020-06-29 ENCOUNTER — Telehealth: Payer: Self-pay | Admitting: *Deleted

## 2020-06-29 NOTE — Telephone Encounter (Signed)
-----   Message from Jerene Bears, MD sent at 06/29/2020  2:43 PM EST ----- Pt was a no-show for his EGD and colonoscopy today for severe iron def anemia and heme + stools. These procedures remain strongly recommended Please reach out and attempt to get them rescheduled Thanks JMP

## 2020-06-29 NOTE — Telephone Encounter (Signed)
I have attempted to reach patient at the phone number listed in his chart. Unfortunately, number rings multiple times and then goes to a voicemail that is full. I have sent a letter to patient's listed home address in hopes that he will call our office to reschedule his appointment for endoscopy/colonoscopy.

## 2020-08-29 ENCOUNTER — Other Ambulatory Visit: Payer: Self-pay

## 2020-08-29 ENCOUNTER — Emergency Department (HOSPITAL_COMMUNITY): Payer: 59

## 2020-08-29 ENCOUNTER — Encounter (HOSPITAL_COMMUNITY): Payer: Self-pay

## 2020-08-29 ENCOUNTER — Inpatient Hospital Stay (HOSPITAL_COMMUNITY)
Admission: EM | Admit: 2020-08-29 | Discharge: 2020-09-01 | DRG: 304 | Disposition: A | Discharge status: Home / Self Care | Payer: 59 | Attending: Internal Medicine | Admitting: Internal Medicine

## 2020-08-29 DIAGNOSIS — K635 Polyp of colon: Secondary | ICD-10-CM | POA: Diagnosis present

## 2020-08-29 DIAGNOSIS — R809 Proteinuria, unspecified: Secondary | ICD-10-CM

## 2020-08-29 DIAGNOSIS — I169 Hypertensive crisis, unspecified: Secondary | ICD-10-CM | POA: Diagnosis present

## 2020-08-29 DIAGNOSIS — I161 Hypertensive emergency: Secondary | ICD-10-CM | POA: Diagnosis present

## 2020-08-29 DIAGNOSIS — A048 Other specified bacterial intestinal infections: Secondary | ICD-10-CM | POA: Diagnosis present

## 2020-08-29 DIAGNOSIS — K641 Second degree hemorrhoids: Secondary | ICD-10-CM | POA: Diagnosis present

## 2020-08-29 DIAGNOSIS — E1122 Type 2 diabetes mellitus with diabetic chronic kidney disease: Secondary | ICD-10-CM | POA: Diagnosis present

## 2020-08-29 DIAGNOSIS — Z20822 Contact with and (suspected) exposure to covid-19: Secondary | ICD-10-CM | POA: Diagnosis present

## 2020-08-29 DIAGNOSIS — D62 Acute posthemorrhagic anemia: Secondary | ICD-10-CM | POA: Diagnosis present

## 2020-08-29 DIAGNOSIS — I5043 Acute on chronic combined systolic (congestive) and diastolic (congestive) heart failure: Secondary | ICD-10-CM

## 2020-08-29 DIAGNOSIS — E872 Acidosis: Secondary | ICD-10-CM | POA: Diagnosis present

## 2020-08-29 DIAGNOSIS — Z79899 Other long term (current) drug therapy: Secondary | ICD-10-CM | POA: Diagnosis not present

## 2020-08-29 DIAGNOSIS — Z597 Insufficient social insurance and welfare support: Secondary | ICD-10-CM | POA: Diagnosis not present

## 2020-08-29 DIAGNOSIS — K2950 Unspecified chronic gastritis without bleeding: Secondary | ICD-10-CM | POA: Diagnosis present

## 2020-08-29 DIAGNOSIS — N184 Chronic kidney disease, stage 4 (severe): Secondary | ICD-10-CM | POA: Diagnosis present

## 2020-08-29 DIAGNOSIS — D649 Anemia, unspecified: Secondary | ICD-10-CM

## 2020-08-29 DIAGNOSIS — T501X5A Adverse effect of loop [high-ceiling] diuretics, initial encounter: Secondary | ICD-10-CM | POA: Diagnosis not present

## 2020-08-29 DIAGNOSIS — I5023 Acute on chronic systolic (congestive) heart failure: Secondary | ICD-10-CM | POA: Diagnosis present

## 2020-08-29 DIAGNOSIS — D509 Iron deficiency anemia, unspecified: Secondary | ICD-10-CM | POA: Diagnosis present

## 2020-08-29 DIAGNOSIS — I10 Essential (primary) hypertension: Secondary | ICD-10-CM

## 2020-08-29 DIAGNOSIS — I130 Hypertensive heart and chronic kidney disease with heart failure and stage 1 through stage 4 chronic kidney disease, or unspecified chronic kidney disease: Secondary | ICD-10-CM | POA: Diagnosis present

## 2020-08-29 DIAGNOSIS — F17210 Nicotine dependence, cigarettes, uncomplicated: Secondary | ICD-10-CM | POA: Diagnosis present

## 2020-08-29 DIAGNOSIS — K922 Gastrointestinal hemorrhage, unspecified: Secondary | ICD-10-CM

## 2020-08-29 DIAGNOSIS — E875 Hyperkalemia: Secondary | ICD-10-CM | POA: Diagnosis present

## 2020-08-29 DIAGNOSIS — L270 Generalized skin eruption due to drugs and medicaments taken internally: Secondary | ICD-10-CM | POA: Diagnosis not present

## 2020-08-29 DIAGNOSIS — K921 Melena: Secondary | ICD-10-CM | POA: Diagnosis present

## 2020-08-29 DIAGNOSIS — N179 Acute kidney failure, unspecified: Secondary | ICD-10-CM | POA: Diagnosis present

## 2020-08-29 DIAGNOSIS — Y92239 Unspecified place in hospital as the place of occurrence of the external cause: Secondary | ICD-10-CM | POA: Diagnosis not present

## 2020-08-29 DIAGNOSIS — R195 Other fecal abnormalities: Secondary | ICD-10-CM

## 2020-08-29 DIAGNOSIS — R21 Rash and other nonspecific skin eruption: Secondary | ICD-10-CM

## 2020-08-29 DIAGNOSIS — Z91120 Patient's intentional underdosing of medication regimen due to financial hardship: Secondary | ICD-10-CM | POA: Diagnosis not present

## 2020-08-29 DIAGNOSIS — Z9114 Patient's other noncompliance with medication regimen: Secondary | ICD-10-CM | POA: Diagnosis not present

## 2020-08-29 HISTORY — DX: Encounter for other specified aftercare: Z51.89

## 2020-08-29 LAB — CBC WITH DIFFERENTIAL/PLATELET
Abs Immature Granulocytes: 0.02 10*3/uL (ref 0.00–0.07)
Basophils Absolute: 0 10*3/uL (ref 0.0–0.1)
Basophils Relative: 0 %
Eosinophils Absolute: 0.1 10*3/uL (ref 0.0–0.5)
Eosinophils Relative: 1 %
HCT: 22.2 % — ABNORMAL LOW (ref 39.0–52.0)
Hemoglobin: 6.4 g/dL — CL (ref 13.0–17.0)
Immature Granulocytes: 0 %
Lymphocytes Relative: 9 %
Lymphs Abs: 0.7 10*3/uL (ref 0.7–4.0)
MCH: 26.7 pg (ref 26.0–34.0)
MCHC: 28.8 g/dL — ABNORMAL LOW (ref 30.0–36.0)
MCV: 92.5 fL (ref 80.0–100.0)
Monocytes Absolute: 0.6 10*3/uL (ref 0.1–1.0)
Monocytes Relative: 9 %
Neutro Abs: 5.6 10*3/uL (ref 1.7–7.7)
Neutrophils Relative %: 81 %
Platelets: 201 10*3/uL (ref 150–400)
RBC: 2.4 MIL/uL — ABNORMAL LOW (ref 4.22–5.81)
RDW: 18.3 % — ABNORMAL HIGH (ref 11.5–15.5)
WBC: 7 10*3/uL (ref 4.0–10.5)
nRBC: 0.3 % — ABNORMAL HIGH (ref 0.0–0.2)

## 2020-08-29 LAB — POC OCCULT BLOOD, ED: Fecal Occult Bld: POSITIVE — AB

## 2020-08-29 LAB — COMPREHENSIVE METABOLIC PANEL
ALT: 33 U/L (ref 0–44)
AST: 46 U/L — ABNORMAL HIGH (ref 15–41)
Albumin: 2.6 g/dL — ABNORMAL LOW (ref 3.5–5.0)
Alkaline Phosphatase: 83 U/L (ref 38–126)
Anion gap: 10 (ref 5–15)
BUN: 61 mg/dL — ABNORMAL HIGH (ref 8–23)
CO2: 16 mmol/L — ABNORMAL LOW (ref 22–32)
Calcium: 7.5 mg/dL — ABNORMAL LOW (ref 8.9–10.3)
Chloride: 112 mmol/L — ABNORMAL HIGH (ref 98–111)
Creatinine, Ser: 5.01 mg/dL — ABNORMAL HIGH (ref 0.61–1.24)
GFR, Estimated: 12 mL/min — ABNORMAL LOW (ref >60)
Glucose, Bld: 95 mg/dL (ref 70–99)
Potassium: 5.4 mmol/L — ABNORMAL HIGH (ref 3.5–5.1)
Sodium: 138 mmol/L (ref 135–145)
Total Bilirubin: 0.8 mg/dL (ref 0.3–1.2)
Total Protein: 6.9 g/dL (ref 6.5–8.1)

## 2020-08-29 LAB — URINALYSIS, ROUTINE W REFLEX MICROSCOPIC
Bacteria, UA: NONE SEEN
Bilirubin Urine: NEGATIVE
Glucose, UA: NEGATIVE mg/dL
Hgb urine dipstick: NEGATIVE
Ketones, ur: NEGATIVE mg/dL
Leukocytes,Ua: NEGATIVE
Nitrite: NEGATIVE
Protein, ur: >=300 mg/dL — AB
Specific Gravity, Urine: 1.013 (ref 1.005–1.030)
pH: 6 (ref 5.0–8.0)

## 2020-08-29 LAB — PROTIME-INR
INR: 1.1 (ref 0.8–1.2)
Prothrombin Time: 14.1 seconds (ref 11.4–15.2)

## 2020-08-29 LAB — SARS CORONAVIRUS 2 (TAT 6-24 HRS): SARS Coronavirus 2: NEGATIVE

## 2020-08-29 LAB — BASIC METABOLIC PANEL
Anion gap: 8 (ref 5–15)
BUN: 60 mg/dL — ABNORMAL HIGH (ref 8–23)
CO2: 18 mmol/L — ABNORMAL LOW (ref 22–32)
Calcium: 7.4 mg/dL — ABNORMAL LOW (ref 8.9–10.3)
Chloride: 113 mmol/L — ABNORMAL HIGH (ref 98–111)
Creatinine, Ser: 4.89 mg/dL — ABNORMAL HIGH (ref 0.61–1.24)
GFR, Estimated: 13 mL/min — ABNORMAL LOW (ref >60)
Glucose, Bld: 88 mg/dL (ref 70–99)
Potassium: 4.9 mmol/L (ref 3.5–5.1)
Sodium: 139 mmol/L (ref 135–145)

## 2020-08-29 LAB — BRAIN NATRIURETIC PEPTIDE: B Natriuretic Peptide: 3438.2 pg/mL — ABNORMAL HIGH (ref 0.0–100.0)

## 2020-08-29 LAB — PREPARE RBC (CROSSMATCH)

## 2020-08-29 IMAGING — DX DG CHEST 1V PORT
1 series · 1 of 1 positions shown · non-contrast
Comparison: [DATE]

CLINICAL DATA: Shortness of breath

EXAM:
PORTABLE CHEST 1 VIEW

[chest ap]
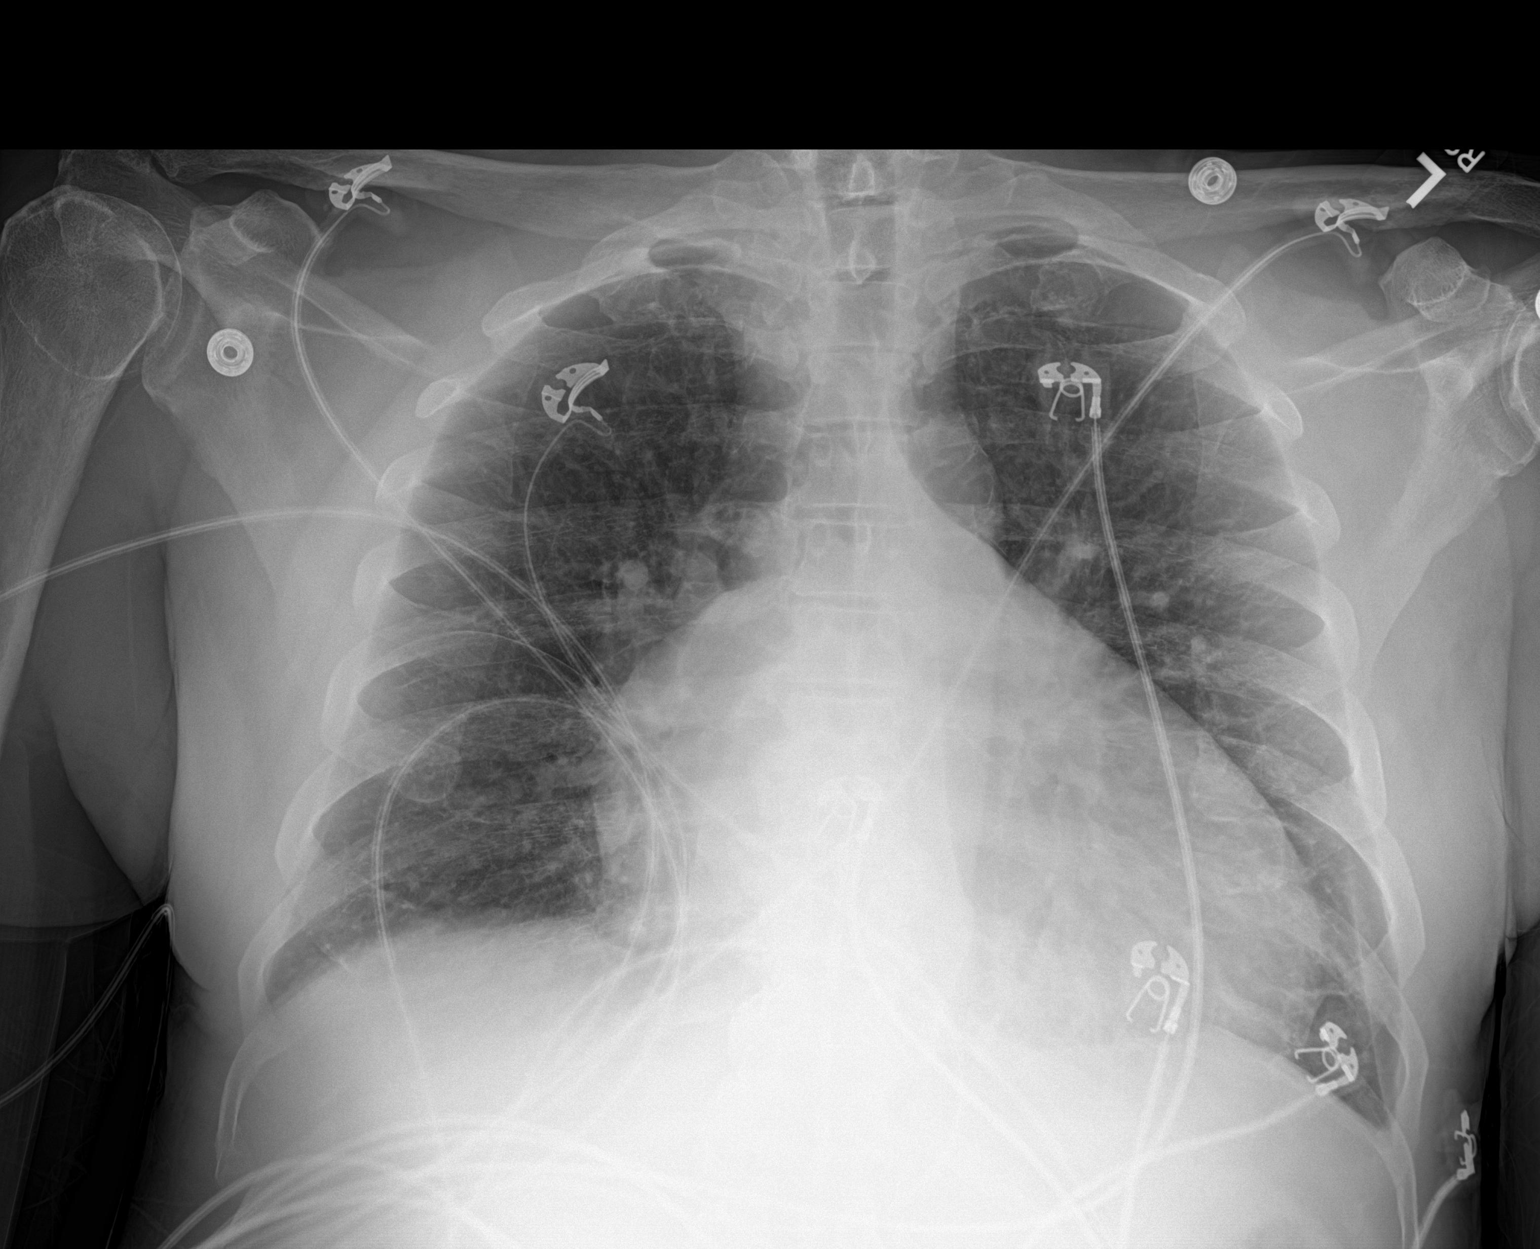

[1 of 1 positions shown; findings below may reference images not displayed]

FINDINGS: Cardiomegaly. Cardiac silhouette appears slightly increased in size
from prior, which could be accentuated by technique. There is
pulmonary vascular congestion and mild diffuse interstitial
prominence. No focal airspace consolidation. No pleural effusion or
pneumothorax.
IMPRESSION: 1. Cardiomegaly with pulmonary vascular congestion and mild diffuse
interstitial prominence, suggesting CHF with mild interstitial
edema.
2. Cardiac silhouette appears slightly more prominent compared to
prior study. Underlying pericardial effusion not excluded.

## 2020-08-29 MED ORDER — SODIUM CHLORIDE 0.9 % IV SOLN
80.0000 mg | Freq: Once | INTRAVENOUS | Status: AC
  Administered 2020-08-29: 80 mg via INTRAVENOUS
  Filled 2020-08-29: qty 80

## 2020-08-29 MED ORDER — ACETAMINOPHEN 325 MG PO TABS: 650.0000 mg | ORAL_TABLET | Freq: Four times a day (QID) | ORAL | Status: DC | PRN

## 2020-08-29 MED ORDER — AMLODIPINE BESYLATE 5 MG PO TABS
5.0000 mg | ORAL_TABLET | Freq: Every day | ORAL | Status: DC
  Administered 2020-08-29 – 2020-08-31 (×3): 5 mg via ORAL
  Filled 2020-08-29 (×3): qty 1

## 2020-08-29 MED ORDER — FUROSEMIDE 10 MG/ML IJ SOLN
40.0000 mg | Freq: Once | INTRAMUSCULAR | Status: AC
  Administered 2020-08-29: 40 mg via INTRAVENOUS
  Filled 2020-08-29: qty 4

## 2020-08-29 MED ORDER — PANTOPRAZOLE SODIUM 40 MG IV SOLR
8.0000 mg/h | INTRAVENOUS | Status: DC
  Administered 2020-08-29 – 2020-08-30 (×3): 8 mg/h via INTRAVENOUS
  Filled 2020-08-29 (×4): qty 80

## 2020-08-29 MED ORDER — ISOSORBIDE MONONITRATE ER 30 MG PO TB24
30.0000 mg | ORAL_TABLET | Freq: Every day | ORAL | Status: DC
  Administered 2020-08-29: 30 mg via ORAL
  Filled 2020-08-29: qty 1

## 2020-08-29 MED ORDER — AMLODIPINE BESYLATE 5 MG PO TABS
5.0000 mg | ORAL_TABLET | Freq: Once | ORAL | Status: AC
  Administered 2020-08-29: 5 mg via ORAL
  Filled 2020-08-29: qty 1

## 2020-08-29 MED ORDER — ACETAMINOPHEN 650 MG RE SUPP: 650.0000 mg | Freq: Four times a day (QID) | RECTAL | Status: DC | PRN

## 2020-08-29 MED ORDER — BETAMETHASONE VALERATE 0.1 % EX LOTN: TOPICAL_LOTION | Freq: Two times a day (BID) | CUTANEOUS | Status: DC

## 2020-08-29 MED ORDER — CARVEDILOL 6.25 MG PO TABS
6.2500 mg | ORAL_TABLET | Freq: Two times a day (BID) | ORAL | Status: DC
  Administered 2020-08-29 – 2020-09-01 (×8): 6.25 mg via ORAL
  Filled 2020-08-29 (×2): qty 1
  Filled 2020-08-29: qty 2
  Filled 2020-08-29 (×2): qty 1
  Filled 2020-08-29: qty 2
  Filled 2020-08-29 (×2): qty 1

## 2020-08-29 MED ORDER — TRIAMCINOLONE ACETONIDE 0.1 % EX LOTN
TOPICAL_LOTION | Freq: Two times a day (BID) | CUTANEOUS | Status: DC
  Filled 2020-08-29 (×2): qty 60

## 2020-08-29 MED ORDER — SODIUM CHLORIDE 0.9% IV SOLUTION: Freq: Once | INTRAVENOUS | Status: AC

## 2020-08-29 NOTE — ED Triage Notes (Signed)
Pt came to the  ED due to swollen legs has been out of his meds for 2 weeks. Increasing SOB with ambulation.

## 2020-08-29 NOTE — Consult Note (Cosign Needed Addendum)
Referring Provider:  Krista Blue PA-C  Primary Care Physician:  Patient, No Pcp Per Primary Gastroenterologist:  Dr. Hilarie Fredrickson  Reason for Consultation:  Anemia, FOBT positive  HPI: Jeffrey Zuniga is a 65 y.o. male with a past medical history of hypertension, diabetes mellitus type 2 and iron deficiency anemia.   Mr. Jeffrey Zuniga was previously admitted to the hospital 04/01/2021 with SOB, IDA, AKI and CHF.  At that time,  His creatinine level was 4.12 with unknown baseline.  Hemoglobin level was 5.2. Iron 25 and Iron saturation was 7.  He received 2 units of packed red blood cells and he was gently diuresed.  His anemia and and renal status stabilized and he was discharged home on 10/26 with plans for EGD/colonoscopoy as an outpatient.  His hemoglobin level was 10.3 at the time of discharge.  He was seen by Nicoletta Ba in our outpatient GI clinic on 05/02/2020 for hospital follow-up.  An EGD and colonoscopy were scheduled on 06/29/2020 but he was a no show.    He presented to [facility] ED this morning due to having shortness of breath and lower extremity edema which progressively worsened over the past 2 weeks.  No chest pain.  Labs in the ED showed a hemoglobin level of 6.4.  MCV 22.2.  Potassium 5.4.  BUN 61.  Creatinine 5.01.  AST 46.  ALT 33.  Total bili 0.8.  83.  FOBT positive.  SARS coronavirus 2 pending.  A GI consult was requeste for further evaluation regarding IDA with a positive FOBT.  He complains of having lower extremity edema which has progressively worsened over the past 2 weeks.  He had intermittent shortness of breath over the past few weeks which worsened over the past few days.  No chest pain.  He reported taking ibuprofen 200 mg 1 tab p.o. 6 times daily for the past 2 weeks due to bilateral ankle and foot pain.  No aspirin use.  He denies having any dysphagia, heartburn or upper abdominal pain.  No lower abdominal pain.  He is passing a normal formed brown bowel  movement daily.  He denies having any rectal bleeding or melena.  He underwent a colonoscopy at the age of 59 which she reported was normal while living in [place].  He smokes 1 pack of cigarettes weekly.  He drinks 3 beers weekly.  No fever, sweats or chills.  No weight loss.  No known family history of esophageal, gastric or colon cancer.  He has a diffuse erythematous papular rash with ulcerations from scratching to his chest, abdomen, arms and legs.  He stated this rash developed after he was discharged from the hospital 04/05/2020.  At that time, he was started on several new medications including amlodipine, carvedilol, isosorbide, furosemide and oral iron.  He stated having a similar rash when he was age 33 which resolved after he took steroids.  HIV nonreactive 04/01/2020.  ECHO 04/02/2020: 1. EF mildly reduced, 45-50% with severe concentric LVH. Abnormal strain imaging with apical sparing which can be seen in cardiac amyloidosis. Review of chart demonstrates severe hypertension with poor control, which makes hypertensive heart disease more likely. Clinical correlation is recommended. Left ventricular ejection fraction, by estimation, is 45 to 50%. Left ventricular ejection fraction by 3D volume is 49 %. The left ventricle has mildly decreased function. The left ventricle demonstrates global hypokinesis. There is severe concentric left ventricular hypertrophy. Left ventricular diastolic parameters are consistent with Grade II diastolic dysfunction (pseudonormalization). Elevated  left atrial pressure. The average left ventricular global longitudinal strain is -11.3 %. The global longitudinal strain is abnormal. 2. Right ventricular systolic function is normal. The right ventricular size is mildly enlarged. There is moderately elevated pulmonary artery systolic pressure. The estimated right ventricular systolic pressure is 10.0 mmHg. 3. Left atrial size was severely dilated. 4. Right atrial  size was moderately dilated. 5. The mitral valve is grossly normal. Trivial mitral valve regurgitation. No evidence of mitral stenosis. 6. The aortic valve is tricuspid. Aortic valve regurgitation is trivial. No aortic stenosis is present. 7. The inferior vena cava is dilated in size with <50% respiratory variability, suggesting right atrial pressure of 15 mmHg  Past Medical History:  Diagnosis Date  . Anemia 04/04/2020  . Blood transfusion without reported diagnosis   . Diabetes mellitus without complication ([place])   . Hypertension     Past Surgical History:  Procedure Laterality Date  . CARDIAC CATHETERIZATION      Prior to Admission medications   Medication Sig Start Date End Date Taking? Authorizing Provider  amLODipine (NORVASC) 5 MG tablet Take 1 tablet (5 mg total) by mouth daily. 04/06/20 05/06/20  Alexandria Lodge, MD  carvedilol (COREG) 6.25 MG tablet Take 1 tablet (6.25 mg total) by mouth 2 (two) times daily with a meal. 04/05/20 05/05/20  Alexandria Lodge, MD  ferrous sulfate 325 (65 FE) MG tablet Take 1 tablet (325 mg total) by mouth 2 (two) times daily with a meal. 05/02/20 06/01/20  Esterwood, Amy S, PA-C  furosemide (LASIX) 20 MG tablet Take 1 tablet (20 mg total) by mouth daily. Patient not taking: Reported on 05/02/2020 04/05/20 05/05/20  Alexandria Lodge, MD  latanoprost (XALATAN) 0.005 % ophthalmic solution Place 1 drop into both eyes at bedtime. 03/20/20   [provider]  Sodium Sulfate-Mag Sulfate-KCl (SUTAB) [phone] MG TABS Take 1 kit by mouth as directed. MANUFACTURER CODES!! BIN: [id number] PCN: CN GROUP: [id number] MEMBER ID: [id number];SUP AS SECONDARY INSURANCE ;NO PRIOR AUTHORIZATION 05/02/20   Esterwood, Amy S, PA-C    Current Facility-Administered Medications  Medication Dose Route Frequency Provider Last Rate Last Admin  . 0.9 %  sodium chloride infusion (Manually program via Guardrails IV Fluids)   Intravenous Once Corena Herter, PA-C      .  amLODipine (NORVASC) tablet 5 mg  5 mg Oral Daily Carmin Muskrat, MD   5 mg at 08/29/20 0826  . carvedilol (COREG) tablet 6.25 mg  6.25 mg Oral BID WC Carmin Muskrat, MD   6.25 mg at 08/29/20 1031  . furosemide (LASIX) injection 40 mg  40 mg Intravenous Once Corena Herter, PA-C      . pantoprazole (PROTONIX) 80 mg in sodium chloride 0.9 % 100 mL (0.8 mg/mL) infusion  8 mg/hr Intravenous Continuous Corena Herter, PA-C      . pantoprazole (PROTONIX) 80 mg in sodium chloride 0.9 % 100 mL IVPB  80 mg Intravenous Once Corena Herter, PA-C       Current Outpatient Medications  Medication Sig Dispense Refill  . amLODipine (NORVASC) 5 MG tablet Take 1 tablet (5 mg total) by mouth daily. 30 tablet 0  . carvedilol (COREG) 6.25 MG tablet Take 1 tablet (6.25 mg total) by mouth 2 (two) times daily with a meal. 60 tablet 0  . ferrous sulfate 325 (65 FE) MG tablet Take 1 tablet (325 mg total) by mouth 2 (two) times daily with a meal. 60 tablet 2  . furosemide (LASIX) 20 MG tablet Take  1 tablet (20 mg total) by mouth daily. (Patient not taking: Reported on 05/02/2020) 30 tablet 0  . latanoprost (XALATAN) 0.005 % ophthalmic solution Place 1 drop into both eyes at bedtime.    . Sodium Sulfate-Mag Sulfate-KCl (SUTAB) [phone] MG TABS Take 1 kit by mouth as directed. MANUFACTURER CODES!! BIN: [id number] PCN: CN GROUP: [id number] MEMBER ID: [id number];EOF AS SECONDARY INSURANCE ;NO PRIOR AUTHORIZATION 24 tablet 0    Allergies as of 08/29/2020  . (No Known Allergies)    History reviewed. No pertinent family history.  Social History   Socioeconomic History  . Marital status: Single    Spouse name: Not on file  . Number of children: Not on file  . Years of education: Not on file  . Highest education level: Not on file  Occupational History  . Not on file  Tobacco Use  . Smoking status: Current Every Day Smoker    Packs/day: 0.25    Types: Cigarettes  . Smokeless tobacco: Never Used  .  Tobacco comment: 2021  " i AM CUTTING BACK :"  Vaping Use  . Vaping Use: Never used  Substance and Sexual Activity  . Alcohol use: Not Currently  . Drug use: Never  . Sexual activity: Not on file  Other Topics Concern  . Not on file  Social History Narrative  . Not on file   Social Determinants of Health   Financial Resource Strain: Not on file  Food Insecurity: Not on file  Transportation Needs: Not on file  Physical Activity: Not on file  Stress: Not on file  Social Connections: Not on file  Intimate Partner Violence: Not on file    Review of Systems: Gen: Denies fever, sweats or chills. No weight loss.  CV: Denies chest pain, palpitations or edema. Resp: Denies cough, shortness of breath of hemoptysis.  GI: See HPI. GU : Denies urinary burning, blood in urine, increased urinary frequency or incontinence. MS: Denies joint pain, muscles aches or weakness. Derm: See HPI. Psych: Denies depression, anxiety or memory loss. Heme: Denies easy bruising, bleeding. Neuro:  Denies headaches, dizziness or paresthesias. Endo:  + DM Iii.   Physical Exam: Vital signs in last 24 hours: Temp:  [97.7 F (36.5 C)] 97.7 F (36.5 C) (03/21 0748) Pulse Rate:  [79-97] 86 (03/21 1030) Resp:  [18-22] 22 (03/21 1030) BP: (187-215)/(98-104) 187/98 (03/21 1030) SpO2:  [100 %] 100 % (03/21 1030) Weight:  [74.8 kg] 74.8 kg (03/21 0811)   General:  Alert 65 year old male in no acute distress. Head:  Normocephalic and atraumatic. Eyes:  No scleral icterus. Conjunctiva pink. Ears:  Normal auditory acuity. Nose:  No deformity, discharge or lesions. Mouth: Upper dentures.  No ulcers or lesions.  Neck:  Supple. No lymphadenopathy or thyromegaly.  Lungs: Breath sounds clear throughout. Heart: Regular rate and rhythm, no murmurs. Abdomen: Soft, nondistended.  Nontender.  Positive bowel sounds all 4 quadrants.  No mass.  No obvious HSM. Rectal: ED PA-C identified brown stool in the rectal vault  which was heme positive. Musculoskeletal:  Symmetrical without gross deformities.  Pulses:  Normal pulses noted. Extremities:  LEs with 1+ edema. Neurologic:  Alert and  oriented x4. No focal deficits.  Speech is clear.  Moves all extremities. Skin: Diffuse raised papular rash with excoriated areas from scratching to his arms, chest, abdomen and lower extremities. Psych:  Alert and cooperative. Normal mood and affect.  Intake/Output from previous day: No intake/output data recorded. Intake/Output this shift: No intake/output data recorded.  Lab Results: Recent Labs    08/29/20 0755  WBC 7.0  HGB 6.4*  HCT 22.2*  PLT 201   BMET Recent Labs    08/29/20 0755  NA 138  K 5.4*  CL 112*  CO2 16*  GLUCOSE 95  BUN 61*  CREATININE 5.01*  CALCIUM 7.5*   LFT Recent Labs    08/29/20 0755  PROT 6.9  ALBUMIN 2.6*  AST 46*  ALT 33  ALKPHOS 83  BILITOT 0.8   PT/INR No results for input(s): LABPROT, INR in the last 72 hours. Hepatitis Panel No results for input(s): HEPBSAG, HCVAB, HEPAIGM, HEPBIGM in the last 72 hours.    Studies/Results: DG Chest Port 1 View  Result Date: 08/29/2020 CLINICAL DATA:  Shortness of breath EXAM: PORTABLE CHEST 1 VIEW COMPARISON:  04/02/2020 FINDINGS: Cardiomegaly. Cardiac silhouette appears slightly increased in size from prior, which could be accentuated by technique. There is pulmonary vascular congestion and mild diffuse interstitial prominence. No focal airspace consolidation. No pleural effusion or pneumothorax. IMPRESSION: 1. Cardiomegaly with pulmonary vascular congestion and mild diffuse interstitial prominence, suggesting CHF with mild interstitial edema. 2. Cardiac silhouette appears slightly more prominent compared to prior study. Underlying pericardial effusion not excluded. Electronically Signed   By: Davina Poke D.O.   On: 08/29/2020 08:27    IMPRESSION/PLAN:  42.  65 year old with iron deficiency anemia presented to the ED  earlier this morning with shortness of breath and lower extremity edema.  Hemoglobin level 6.4 (Hg level 10.3 on 04/05/2020).  FOBT positive. One unit of PRBCs ordered, not yet transfused  -Clear liquid diet EGD and colonoscopy benefits and risks discussed including risk with sedation, risk of bleeding, perforation and infection. EGD and colonoscopy timing to be determined by Dr. Havery Moros  -Pantoprazole 40m IV bid -Check H/H post transfusion  -Monitor H/H closely -Transfuse for Hg < 7 -IV fluids per the medical service   2. CHF. LE edema. LV EF 40 - 45%.  3. AK on CKD. Cr level 5. 01 and BUN 61  (Cr 3.92 and BUN 57 on 04/05/2020)  4. Diffuse rash to chest, abdomen, arms and legs -Defer evaluation and management to the medical service   5. DM II   CNoralyn Pick 08/29/2020, 10:41 AM

## 2020-08-29 NOTE — ED Provider Notes (Signed)
[facility] EMERGENCY DEPARTMENT Provider Note   CSN: [id number] Arrival date & time: 08/29/20  0539     History No chief complaint on file.   Jeffrey Zuniga is a 65 y.o. male with past medical history of HTN who presents the ED with complaints of leg swelling.  I reviewed patient's medical record and was admitted for symptomatic anemia 03/2020, suspected to have likely been in context of GI bleed.  He was then seen by Dr. Hilarie Fredrickson, [facility], who recommended colonoscopy given that it had been at least 10 years.  He was started on ferrous sulfate 325 mg twice daily.  Concern at that time for upper versus lower GI neoplasm, chronic gastropathy, or AVMs.  On my examination, patient states that for the past couple of weeks he has been out of his medications due to medical insurance change.  He states that he has medical insurance, but his prior PCP does not accept his new insurance.  He has not seen anyone on outpatient basis in several months.  He states that he has been having progressively worsening bilateral extremity swelling over the course of the past couple of weeks and also began to feel short of breath couple of days ago.  He states that those symptoms have been mostly with exertion.  Patient works as a Engineer, maintenance (IT) and states that he was short of breath at work today which prompted him to come to the ED for evaluation.  Denies any chest pain, cough, orthopnea, hemoptysis, history of clots or clotting disorder, abdominal pain, syncope, or other symptoms.  HPI     Past Medical History:  Diagnosis Date  . Anemia 04/04/2020  . Blood transfusion without reported diagnosis   . Diabetes mellitus without complication ([place])   . Hypertension     Patient Active Problem List   Diagnosis Date Noted  . LVH (left ventricular hypertrophy) 04/04/2020  . Iron deficiency anemia 04/02/2020  . Acute heart failure with reduced ejection fraction and diastolic dysfunction ([place])  04/02/2020  . Symptomatic anemia 04/01/2020  . Hypertensive emergency 04/01/2020  . Acute kidney injury ([place]) 04/01/2020  . Bilateral lower extremity edema 04/01/2020    Past Surgical History:  Procedure Laterality Date  . CARDIAC CATHETERIZATION         History reviewed. No pertinent family history.  Social History   Tobacco Use  . Smoking status: Current Every Day Smoker    Packs/day: 0.25    Types: Cigarettes  . Smokeless tobacco: Never Used  . Tobacco comment: 2021  " i AM CUTTING BACK :"  Vaping Use  . Vaping Use: Never used  Substance Use Topics  . Alcohol use: Not Currently  . Drug use: Never    Home Medications Prior to Admission medications   Medication Sig Start Date End Date Taking? Authorizing Provider  amLODipine (NORVASC) 5 MG tablet Take 1 tablet (5 mg total) by mouth daily. 04/06/20 05/06/20  Alexandria Lodge, MD  carvedilol (COREG) 6.25 MG tablet Take 1 tablet (6.25 mg total) by mouth 2 (two) times daily with a meal. 04/05/20 05/05/20  Alexandria Lodge, MD  ferrous sulfate 325 (65 FE) MG tablet Take 1 tablet (325 mg total) by mouth 2 (two) times daily with a meal. 05/02/20 06/01/20  Esterwood, Amy S, PA-C  furosemide (LASIX) 20 MG tablet Take 1 tablet (20 mg total) by mouth daily. Patient not taking: Reported on 05/02/2020 04/05/20 05/05/20  Alexandria Lodge, MD  latanoprost (XALATAN) 0.005 % ophthalmic solution Place  1 drop into both eyes at bedtime. 03/20/20   [provider]  Sodium Sulfate-Mag Sulfate-KCl (SUTAB) [phone] MG TABS Take 1 kit by mouth as directed. MANUFACTURER CODES!! BIN: [id number] PCN: CN GROUP: [id number] MEMBER ID: [id number];EPP AS SECONDARY INSURANCE ;NO PRIOR AUTHORIZATION 05/02/20   Esterwood, Amy S, PA-C    Allergies    Patient has no known allergies.  Review of Systems   Review of Systems  All other systems reviewed and are negative.   Physical Exam Updated Vital Signs BP (!) 187/98   Pulse 86   Temp 97.7 F (36.5  C) (Oral)   Resp (!) 22   Ht _0  (1.702 m)   Wt 74.8 kg   SpO2 100%   BMI 25.84 kg/m   Physical Exam Vitals and nursing note reviewed. Exam conducted with a chaperone present.  Constitutional:      General: He is not in acute distress.    Appearance: He is not toxic-appearing.  HENT:     Head: Normocephalic and atraumatic.  Eyes:     General: No scleral icterus.    Conjunctiva/sclera: Conjunctivae normal.  Cardiovascular:     Rate and Rhythm: Normal rate and regular rhythm.     Pulses: Normal pulses.     Comments: Loud heart sounds Pulmonary:     Effort: Pulmonary effort is normal. No respiratory distress.     Breath sounds: Normal breath sounds.     Comments: CTA bilaterally.  No increased work of breathing. Genitourinary:    Comments: No obvious melena or hematochezia. Soft brown. Musculoskeletal:     Right lower leg: Edema present.     Left lower leg: Edema present.     Comments: 3+ pitting edema bilaterally.  Peripheral pulses intact and symmetric.  Ambulatory.  Skin:    General: Skin is dry.  Neurological:     Mental Status: He is alert.     GCS: GCS eye subscore is 4. GCS verbal subscore is 5. GCS motor subscore is 6.  Psychiatric:        Mood and Affect: Mood normal.        Behavior: Behavior normal.        Thought Content: Thought content normal.     ED Results / Procedures / Treatments   Labs (all labs ordered are listed, but only abnormal results are displayed) Labs Reviewed  COMPREHENSIVE METABOLIC PANEL - Abnormal; Notable for the following components:      Result Value   Potassium 5.4 (*)    Chloride 112 (*)    CO2 16 (*)    BUN 61 (*)    Creatinine, Ser 5.01 (*)    Calcium 7.5 (*)    Albumin 2.6 (*)    AST 46 (*)    GFR, Estimated 12 (*)    All other components within normal limits  CBC WITH DIFFERENTIAL/PLATELET - Abnormal; Notable for the following components:   RBC 2.40 (*)    Hemoglobin 6.4 (*)    HCT 22.2 (*)    MCHC 28.8 (*)     RDW 18.3 (*)    nRBC 0.3 (*)    All other components within normal limits  BRAIN NATRIURETIC PEPTIDE - Abnormal; Notable for the following components:   B Natriuretic Peptide 3,438.2 (*)    All other components within normal limits  URINALYSIS, ROUTINE W REFLEX MICROSCOPIC - Abnormal; Notable for the following components:   Protein, ur >=300 (*)    All other components within normal limits  POC OCCULT BLOOD, ED - Abnormal; Notable for the following components:   Fecal Occult Bld POSITIVE (*)    All other components within normal limits  SARS CORONAVIRUS 2 (TAT 6-24 HRS)  PROTIME-INR  PREPARE RBC (CROSSMATCH)    EKG EKG Interpretation  Date/Time:  Monday August 29 2020 07:47:38 EDT Ventricular Rate:  86 PR Interval:    QRS Duration: 91 QT Interval:  398 QTC Calculation: 476 R Axis:   34 Text Interpretation: Sinus rhythm Borderline T abnormalities, lateral leads Borderline prolonged QT interval Abnormal ECG Confirmed by Carmin Muskrat [phone]) on 08/29/2020 7:57:48 AM   Radiology DG Chest Port 1 View  Result Date: 08/29/2020 CLINICAL DATA:  Shortness of breath EXAM: PORTABLE CHEST 1 VIEW COMPARISON:  04/02/2020 FINDINGS: Cardiomegaly. Cardiac silhouette appears slightly increased in size from prior, which could be accentuated by technique. There is pulmonary vascular congestion and mild diffuse interstitial prominence. No focal airspace consolidation. No pleural effusion or pneumothorax. IMPRESSION: 1. Cardiomegaly with pulmonary vascular congestion and mild diffuse interstitial prominence, suggesting CHF with mild interstitial edema. 2. Cardiac silhouette appears slightly more prominent compared to prior study. Underlying pericardial effusion not excluded. Electronically Signed   By: Davina Poke D.O.   On: 08/29/2020 08:27    Procedures .Critical Care Performed by: Corena Herter, PA-C Authorized by: Corena Herter, PA-C   Critical care provider statement:    Critical  care time (minutes):  60   Critical care was necessary to treat or prevent imminent or life-threatening deterioration of the following conditions:  Cardiac failure, renal failure and circulatory failure   Critical care was time spent personally by me on the following activities:  Discussions with consultants, evaluation of patient's response to treatment, examination of patient, ordering and performing treatments and interventions, ordering and review of laboratory studies, ordering and review of radiographic studies, pulse oximetry, re-evaluation of patient's condition, obtaining history from patient or surrogate, review of old charts and blood draw for specimens Comments:     Symptomatic anemia requiring transfusions, CHF exacerbation, hypertensive urgency w/ end-organ injury     Medications Ordered in ED Medications  amLODipine (NORVASC) tablet 5 mg (5 mg Oral Given 08/29/20 0826)  carvedilol (COREG) tablet 6.25 mg (6.25 mg Oral Given 08/29/20 0826)  furosemide (LASIX) injection 40 mg (has no administration in time range)  pantoprazole (PROTONIX) 80 mg in sodium chloride 0.9 % 100 mL IVPB (has no administration in time range)  pantoprazole (PROTONIX) 80 mg in sodium chloride 0.9 % 100 mL (0.8 mg/mL) infusion (has no administration in time range)  0.9 %  sodium chloride infusion (Manually program via Guardrails IV Fluids) (has no administration in time range)    ED Course  I have reviewed the triage vital signs and the nursing notes.  Pertinent labs & imaging results that were available during my care of the patient were reviewed by me and considered in my medical decision making (see chart for details).  Clinical Course as of 08/29/20 1105  Mon Aug 29, 2020  1031 Hemoglobin(!!): 6.4 [GG]  1031 Creatinine(!): 5.01 [GG]  1031 B Natriuretic Peptide(!): 3,438.2 [GG]  1031 Fecal Occult Blood, POC(!): POSITIVE [GG]  1039 I spoke with [facility], PA Kennedy-Smith, who will see patient. [GG]   1103 I spoke with Dr. Sherry Ruffing, Internal Medicine, who will see and admit patient. [GG]    Clinical Course User Index [GG] Corena Herter, PA-C   MDM Rules/Calculators/A&P  Zach Tietje was evaluated in Emergency Department on 08/29/2020 for the symptoms described in the history of present illness. He was evaluated in the context of the global COVID-19 pandemic, which necessitated consideration that the patient might be at risk for infection with the SARS-CoV-2 virus that causes COVID-19. Institutional protocols and algorithms that pertain to the evaluation of patients at risk for COVID-19 are in a state of rapid change based on information released by regulatory bodies including the CDC and federal and state organizations. These policies and algorithms were followed during the patient's care in the ED.  I personally reviewed patient's medical chart and all notes from triage and staff during today's encounter. I have also ordered and reviewed all labs and imaging that I felt to be medically necessary in the evaluation of this patient's complaints and with consideration of their physical exam. If needed, translation services were available and utilized.   Patient's history and physical exam is suggestive of CHF exacerbation, but SOB could also reflect anemia as that had been case during prior hospitalization for similar symptoms.  He has peripheral edema and has been off of his antihypertensive medication for several weeks.  He has cardiomegaly on chest x-ray and while he denies orthopnea or weight gain, states that he has been feeling dyspneic with exertion over the course of the past few days.  Laboratory work-up notable for hemoglobin 6.4, down from baseline of 10.  CMP with worsening CKD.  Creatinine elevated 5.01 and GFR down to 12.  This is worse than the last time he was admitted to the hospital.  Will obtain fecal occult test and initiate transfusions.  Plan to call  with our GI before admission to the hospitalist. Patient tells me that he was supposed to have a colonoscopy in November, but it was delayed due to COVID-19.  Never had it rescheduled.  He states that he has continued to have melena "sometimes".  Rectal exam largely unremarkable with brown stool on my exam.  We will admit to the hospitalist for his symptomatic anemia, hypertensive urgency/emergency with evidence of endorgan injury, and CHF exacerbation with BNP elevated at 3,438 and evidence of edema on chest x-ray.    I spoke with [facility], PA Kennedy-Smith, who will see patient.  I spoke with Dr. Sherry Ruffing, Internal Medicine, who will see and admit patient.   Final Clinical Impression(s) / ED Diagnoses Final diagnoses:  Symptomatic anemia  Acute on chronic combined systolic and diastolic congestive heart failure [facility])    Rx / DC Orders ED Discharge Orders    None       Corena Herter, PA-C 08/29/20 1105    Carmin Muskrat, MD 09/06/20 1615

## 2020-08-29 NOTE — H&P (Signed)
Date: 08/29/2020               Patient Name:  Jeffrey Zuniga MRN: [id number]  DOB: 1955-11-30 Age / Sex: 65 y.o., male   PCP: Patient, No Pcp Per         Medical Service: Internal Medicine Teaching Service         Attending Physician: Dr. Aldine Contes, MD    First Contact: Dr. Johnney Ou Pager: [phone]  Second Contact: Dr. Marianna Payment Pager: [phone]       After Hours (After 5p/  First Contact Pager: [phone]  weekends / holidays): Second Contact Pager: [phone]   Chief Complaint: SOB, LE swelling  History of Present Illness: This is a 65 year old male with a history of HTN, DM, HFrEF (04/02/20 EF 45-50%, severe concentric LVH), and renal failure (unclear BL Cr, last Ct 3.9 4 months ago) who is presenting with BL lower extremity swelling, BL LE pain, worsening SOB, and weight gain after running out of his medications approximately 2 week ago.  He also endorses occasional dark stools, a nonproductive cough occasionally at night's.  He denies any fevers, chills, chest pain, nausea, vomiting, headache, vision changes, urinary issues, or abdominal pain.  He does state he has been taking ibuprofen twice a day for the past 2 weeks due to his lower extremity leg pain.  He also endorses a diffuse rash on his legs, arms, chest, and back.  He states that it started on his right shin, had a thickened skin, was itchy and painful, and then spread to both of his legs, his abdomen, his arms, and his back.  He states that the lesions have actually improved over the past few days, is less painful and itchy.  He states that he had something similar about 30 years ago, where he had been told he had eczema.  He was given steroid cream at that time which helped.  On chart review, patient was admitted from 10/22-10/26 for symptomatic anemia, hypertensive emergency, acute renal failure, acute heart failure, and prediabetes.  He was treated with IV Lasix at that time and his hemoglobin had improved after transfusion  and IV iron supplementation.  He was discharged on heart failure medications, including amlodipine, Coreg, Lasix, and Imdur.  He was advised to follow-up with GI for a colonoscopy.  Patient had a colonoscopy scheduled however he was a no-show for this.  In the ED patient was noted to be afebrile, with normal respiratory rate, heart rate, and blood pressure up to 215/101.  His labs were significant for a sodium 138, potassium 5.4, bicarb 16, creatinine 5.01, AST 46.  His WBC was 7, hemoglobin 6.4, platelets 201.  BNP 3400.  Chest x-ray showed bilateral pulmonary congestion with prominent cardiac silhouette.  His EKG showed sinus rhythm, heart rate 86, normal axis, low voltage, no ST changes.  FOBT positive.  Urinalysis showed proteinuria.  Patient was was given pantoprazole, amlodipine, Coreg, and Lasix 40.  GI was consulted.  I MTS was consulted for admission.   Meds:  Current Meds  Medication Sig  . amLODipine (NORVASC) 5 MG tablet Take 1 tablet (5 mg total) by mouth daily.  . carvedilol (COREG) 6.25 MG tablet Take 1 tablet (6.25 mg total) by mouth 2 (two) times daily with a meal.  . ferrous sulfate 325 (65 FE) MG tablet Take 1 tablet (325 mg total) by mouth 2 (two) times daily with a meal.  . furosemide (LASIX) 20 MG tablet Take 1  tablet (20 mg total) by mouth daily.  Marland Kitchen latanoprost (XALATAN) 0.005 % ophthalmic solution Place 1 drop into both eyes at bedtime.     Allergies: Allergies as of 08/29/2020  . (No Known Allergies)   Past Medical History:  Diagnosis Date  . Anemia 04/04/2020  . Blood transfusion without reported diagnosis   . Diabetes mellitus without complication ([place])   . Hypertension     Family History: Unknown  Social History: Works at [employer], lives alone.  Daughters live in [place] and [place].  Reports smoking approximately 1 cigarette/day over the past year, had previously quit 30 years before that.  Drinks approximately 1 beer per day.  Denies any recreational  drug use.  Had a distant history of cocaine use at the age of 70, denied any IV drug use.  Review of Systems: A complete ROS was negative except as per HPI.   Physical Exam: Blood pressure (!) 187/98, pulse 86, temperature 97.7 F (36.5 C), temperature source Oral, resp. rate (!) 22, height '5\' 7"'$  (1.702 m), weight 74.8 kg, SpO2 100 %. Physical Exam Constitutional:      Appearance: Normal appearance.  HENT:     Head: Normocephalic and atraumatic.     Mouth/Throat:     Mouth: Mucous membranes are moist.     Pharynx: Oropharynx is clear.  Eyes:     Extraocular Movements: Extraocular movements intact.     Conjunctiva/sclera: Conjunctivae normal.     Pupils: Pupils are equal, round, and reactive to light.  Cardiovascular:     Rate and Rhythm: Normal rate and regular rhythm.     Pulses: Normal pulses.     Heart sounds: Normal heart sounds.  Pulmonary:     Effort: No respiratory distress.     Breath sounds: Rales (Bibasilar) present.  Abdominal:     General: Abdomen is flat. Bowel sounds are normal.     Palpations: Abdomen is soft.  Musculoskeletal:        General: Swelling (2+ BL LE swelling) present. Normal range of motion.     Cervical back: Normal range of motion and neck supple.  Skin:    General: Skin is warm and dry.     Capillary Refill: Capillary refill takes less than 2 seconds.     Findings: Rash present.  Neurological:     General: No focal deficit present.     Mental Status: He is alert and oriented to person, place, and time.  Psychiatric:        Mood and Affect: Mood normal.        Behavior: Behavior normal.            EKG: personally reviewed my interpretation is sinus rhythm, heart rate 86, normal axis, low voltage, no acute ST changes  CXR: personally reviewed my interpretation is bilateral pulmonary congestion, prominent silhouette, no clear infiltrates  Assessment & Plan by Problem: Active Problems:   Hypertensive crisis  65 y.o. 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    Hypertensive emergency:  BP up to 215/101 on admission.  In setting of medication noncompliance.  With acute on chronic heart failure and worsening renal function.  Had resumed his amlodipine and Coreg, and given Lasix dose with improvement of his blood pressure to around 180  systolic.  Patient improving with his oral medications, does not have any chest pain or significant shortness of breath and no new oxygen requirements, also this blood pressure is likely around where he stays outpatient.  Goal BP 170-190 over next 24 hours.  We will hold off on PCCM consult for now. Patient reports running out of his medications approximately 2 weeks ago, he states that his primary care physician that he was seeing is no longer taking his insurance due to a switch in his insurance.  He does not have a stable PCP at this time.  -Continue home amlodipine and Coreg -Can consider nitro drip if BP above goal -Placed on telemetry -Frequent vital checks -Monitor BMP renal function as well -TOC consult for PCP needs  Acute on chronic HFrEF: Patient has a history of HFrEF, EF in 10/21 was 45-50% with severe concentric LVH, abnormal strain imaging with apical sparing which could be seen in cardiac amyloidosis, however presentation more consistent with severe hypertension with poor control, and grade 2 diastolic dysfunction. Was on amlodipine, Coreg, Lasix, and Imdur at home. He presented with a 2-week history of worsening DOE, lower extremity swelling. Weight today is 74 kg up from 67 kg on discharge in 10/21. Ran out of meds 2 weeks ago.  Chest x-ray showed bilateral pulmonary congestion.  BNP elevated at 3400.  Patient appears volume overloaded on exam, has  bibasilar crackles and lower extremity swelling.  His blood pressure is up to 215/101 on admission.  He he reports monitoring his sodium intake and limits his volume intake.  He denies any chest pain, EKG shows no acute ST changes.  Patient was given Lasix and restarted on his home amlodipine and carvedilol.  -Status post Lasix 40 mg IV -Continue home Coreg -Hold home Imdur for now, can resume if BP out of goal range -Daily weights -Strict I+Os -Place on telemetry -Hold on repeat echocardiogram for now  GI bleed: Patient reporting melena, generalized fatigue, and worsening shortness of breath.  Has a history of IDA, and plan for outpatient GI colonoscopy which he was not able to do.  He also endorsed daily ibuprofen use for the past 2 weeks.  FOBT positive.  Hemoglobin 6.4.    -GI consulted, appreciate recommendations -Transfusing 1 unit packed red blood cells -Follow-up H&H -Started Protonix drip -N.p.o. for now -Daily CBC  Acute on chronic kidney failure:  Metabolic acidosis: Hyperkalemia: On admission his labs showed a creatinine 5.01, potassium 5.4, bicarb 16.  Unclear baseline, his last creatinine was 3.9 and October 2021.  His EKG showed decreased voltage, with no peaked T waves.  Proteinuria on UA.  Likely in setting of medication noncompliance and hypertensive emergency.  Patient reports no issues with urination, and no decrease in urine.  Giving Lasix and attempting to improve blood pressure as above.  -Repeat BMP later today -Can dose Lokelma if potassium remains elevated -Daily BMP -Consider nephrology consult if continues to worsen  Diffuse rash, likely psoriasis:  Started on RLE, spread to legs, arms, abdomen, back. Dark plaques with small scaling noted, and excoriation, mild warmth noted on RLE.  Patient reports a history of this approximately 30 years ago which had improved with steroids.  -Start betamethasone lotion -Consider dermatology referral outpatient  Dispo:  Admit patient to Inpatient with expected length of stay greater than 2 midnights.  Signed: Asencion Noble, MD 08/29/2020, 1:16 PM  Pager: [phone] After 5pm on weekdays and 1pm on weekends: On Call pager: [phone]

## 2020-08-30 DIAGNOSIS — E611 Iron deficiency: Secondary | ICD-10-CM

## 2020-08-30 LAB — CBC
HCT: 24.6 % — ABNORMAL LOW (ref 39.0–52.0)
Hemoglobin: 7.9 g/dL — ABNORMAL LOW (ref 13.0–17.0)
MCH: 28.5 pg (ref 26.0–34.0)
MCHC: 32.1 g/dL (ref 30.0–36.0)
MCV: 88.8 fL (ref 80.0–100.0)
Platelets: 198 10*3/uL (ref 150–400)
RBC: 2.77 MIL/uL — ABNORMAL LOW (ref 4.22–5.81)
RDW: 17.1 % — ABNORMAL HIGH (ref 11.5–15.5)
WBC: 6.3 10*3/uL (ref 4.0–10.5)
nRBC: 0 % (ref 0.0–0.2)

## 2020-08-30 LAB — TYPE AND SCREEN
ABO/RH(D): O POS
Antibody Screen: NEGATIVE
Unit division: 0
Unit division: 0

## 2020-08-30 LAB — BPAM RBC
Blood Product Expiration Date: 202203282359
Blood Product Expiration Date: 202204212359
ISSUE DATE / TIME: 202203211441
ISSUE DATE / TIME: 202203211829
Unit Type and Rh: 5100
Unit Type and Rh: 5100

## 2020-08-30 LAB — COMPREHENSIVE METABOLIC PANEL
ALT: 28 U/L (ref 0–44)
AST: 31 U/L (ref 15–41)
Albumin: 2.3 g/dL — ABNORMAL LOW (ref 3.5–5.0)
Alkaline Phosphatase: 70 U/L (ref 38–126)
Anion gap: 9 (ref 5–15)
BUN: 62 mg/dL — ABNORMAL HIGH (ref 8–23)
CO2: 17 mmol/L — ABNORMAL LOW (ref 22–32)
Calcium: 7.7 mg/dL — ABNORMAL LOW (ref 8.9–10.3)
Chloride: 112 mmol/L — ABNORMAL HIGH (ref 98–111)
Creatinine, Ser: 4.84 mg/dL — ABNORMAL HIGH (ref 0.61–1.24)
GFR, Estimated: 13 mL/min — ABNORMAL LOW (ref >60)
Glucose, Bld: 80 mg/dL (ref 70–99)
Potassium: 4.7 mmol/L (ref 3.5–5.1)
Sodium: 138 mmol/L (ref 135–145)
Total Bilirubin: 1.2 mg/dL (ref 0.3–1.2)
Total Protein: 6.3 g/dL — ABNORMAL LOW (ref 6.5–8.1)

## 2020-08-30 LAB — GLUCOSE, CAPILLARY: Glucose-Capillary: 74 mg/dL (ref 70–99)

## 2020-08-30 MED ORDER — BISACODYL 5 MG PO TBEC: 10.0000 mg | DELAYED_RELEASE_TABLET | Freq: Every day | ORAL | Status: DC | PRN

## 2020-08-30 MED ORDER — ISOSORBIDE MONONITRATE ER 30 MG PO TB24
30.0000 mg | ORAL_TABLET | Freq: Every day | ORAL | Status: DC
  Administered 2020-08-30 – 2020-09-01 (×3): 30 mg via ORAL
  Filled 2020-08-30 (×3): qty 1

## 2020-08-30 MED ORDER — CLONIDINE HCL 0.1 MG/24HR TD PTWK: 0.1000 mg | MEDICATED_PATCH | TRANSDERMAL | Status: DC

## 2020-08-30 MED ORDER — PANTOPRAZOLE SODIUM 40 MG IV SOLR
40.0000 mg | Freq: Two times a day (BID) | INTRAVENOUS | Status: DC
  Administered 2020-08-31: 40 mg via INTRAVENOUS
  Filled 2020-08-30: qty 40

## 2020-08-30 MED ORDER — PEG-KCL-NACL-NASULF-NA ASC-C 100 G PO SOLR: 1.0000 | Freq: Once | ORAL | Status: DC

## 2020-08-30 MED ORDER — FUROSEMIDE 10 MG/ML IJ SOLN
40.0000 mg | Freq: Once | INTRAMUSCULAR | Status: AC
  Administered 2020-08-30: 40 mg via INTRAVENOUS
  Filled 2020-08-30: qty 4

## 2020-08-30 MED ORDER — NITROGLYCERIN IN D5W 200-5 MCG/ML-% IV SOLN
0.0000 ug/min | INTRAVENOUS | Status: DC
  Administered 2020-08-30: 5 ug/min via INTRAVENOUS
  Filled 2020-08-30: qty 250

## 2020-08-30 MED ORDER — PEG-KCL-NACL-NASULF-NA ASC-C 100 G PO SOLR
0.5000 | Freq: Once | ORAL | Status: AC
  Administered 2020-08-30: 100 g via ORAL
  Filled 2020-08-30: qty 1

## 2020-08-30 MED ORDER — PEG-KCL-NACL-NASULF-NA ASC-C 100 G PO SOLR
0.5000 | Freq: Once | ORAL | Status: AC
  Administered 2020-08-30: 100 g via ORAL

## 2020-08-30 NOTE — Progress Notes (Signed)
Date: 08/30/2020  Patient name: Jeffrey Zuniga  Medical record number: [id number]  Date of birth: 1955-12-07   I have seen and evaluated Jeffrey Zuniga and discussed their care with the Residency Team.  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  Patient states that his insurance changed and he was unable to follow-up with his PCP as his PCP was not in his new insurance.  As a result of this patient ran out of his medications approximately 2 weeks ago.  Patient states that since then he has noted progressively worsening bilateral lower extremity edema with associated shortness of breath and dyspnea on exertion.  Patient was admitted to the hospital from October 22 to October 26 last year for symptomatic anemia, hypertensive emergency, AKI and acute heart failure exacerbation.  At that time he was treated with IV Lasix and his hemoglobin improved after transfusion.  He was discharged on antihypertensives as well as Lasix and was advised to follow-up with GI for colonoscopy.  Patient however did not make it to his colonoscopy appointment.  Patient does complain of associated nonproductive cough.  No chest pain, no palpitations, no lightheadedness, no syncope, no focal weakness, no tingling or numbness, no nausea or vomiting, no diarrhea, no abdominal pain.  Today, patient states that his shortness of breath is much improved and that his lower extremity swelling is improving as well.  PMHx, Fam Hx, and/or Soc Hx : As per resident admit note  Vitals:   08/30/20 1130 08/30/20 1230  BP: (!) 143/70 (!) 143/72  Pulse: 74 72  Resp: 17 18  Temp: 97.8 F (36.6 C)   SpO2: 100% 100%   General: Awake,  alert, oriented x3, NAD CVS: Regular rate and rhythm, normal heart sounds Lungs: Bibasilar crackles noted on exam Abdomen: Soft, nontender, nondistended, normoactive bowel sounds Extremities: 1 to 2+ bilateral lower extremity pitting edema noted on exam HEENT: Normocephalic, atraumatic Skin: Scaly plaques noted over bilateral lower extremities and upper extremities as well as trunk with excoriations Psych: Normal mood and affect   Assessment and Plan: I have seen and evaluated the patient as outlined above. I agree with the formulated Assessment and Plan as detailed in the residents' note, with the following changes:   1.  Hypertensive emergency: -Patient presented to ED with progressive shortness of breath with dyspnea on exertion and associated cough and was found to have pulmonary edema on exam and imaging here with associated lower extremity edema as well as an AKI on CKD with SBP's in the 200s on admission consistent with an acute hypertensive emergency.  I suspect that his symptoms are secondary to acutely uncontrolled hypertension secondary to being off his medications over the last couple weeks. -Patient's blood pressure had improved to the systolics of [id number] when examined by the resident yesterday and there was no indication for ICU admission at that time. -Overnight, patient was noted to have elevated systolics again to the [id number] and was restarted on a nitroglycerin drip with a goal blood pressure of 170s to 190s -Today, patient blood pressure has improved with systolics in the 0000000 to [id number].  Would continue with current antihypertensives for now.  Will consider stopping nitroglycerin drip and resuming Imdur. 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  He has been off his medications for the last couple weeks.  Would consider repeat echo on this  admission. -Continue with IV Lasix 40 mg daily.  Strict I's and O's and daily weights -Patient also noted to have AKI on CKD with creatinine up to 5.01 on admission from a baseline creatinine of 3.9.  Patient's creatinine has improved mildly to 4.8.  We will continue to monitor BMP closely. -No further work-up at this time. -We will continue to monitor closely  2.  Acute blood loss anemia secondary to GI bleed: -Patient did report melena and generalized fatigue and was found to have a hemoglobin of 6.4 and admission.  He has a history of iron deficiency anemia and was scheduled for an outpatient colonoscopy but was unable to follow-up for this.  He also endorsed ibuprofen use over the last couple weeks -GI follow-up and recommendations appreciated.  Patient scheduled for an EGD and colonoscopy tomorrow for further work-up of iron deficiency anemia -He is status post 1 unit PRBC with improvement in hemoglobin to 7.9 today -Transition Protonix drip to pantoprazole 40 mg IV twice daily -Patient on a clear liquid diet today and will be n.p.o. after midnight -We will continue to monitor CBC closely -No further work-up at this time  Aldine Contes, MD 3/22/20221:28 PM

## 2020-08-30 NOTE — Anesthesia Preprocedure Evaluation (Addendum)
Anesthesia Evaluation  Patient identified by MRN, date of birth, ID band Patient awake    Reviewed: Allergy & Precautions, NPO status , Patient's Chart, lab work & pertinent test results  Airway Mallampati: II  TM Distance: >3 FB Neck ROM: Full    Dental no notable dental hx.    Pulmonary Current Smoker and Patient abstained from smoking.,    Pulmonary exam normal breath sounds clear to auscultation       Cardiovascular hypertension (noncompliant w/meds, SBPs in 180-190s in ED on admission), +CHF (LVEF 59-56%, grade 2 diastolic dysfunction)  Normal cardiovascular exam Rhythm:Regular Rate:Normal  Echo 04/02/20: 1. EF mildly reduced, 45-50% with severe concentric LVH. Abnormal strain  imaging with apical sparing which can be seen in cardiac amyloidosis.  Review of chart demonstrates severe hypertension with poor control, which  makes hypertensive heart disease  more likely. Clinical correlation is recommended. Left ventricular  ejection fraction, by estimation, is 45 to 50%. Left ventricular ejection  fraction by 3D volume is 49 %. The left ventricle has mildly decreased  function. The left ventricle demonstrates  global hypokinesis. There is severe concentric left ventricular  hypertrophy. Left ventricular diastolic parameters are consistent with  Grade II diastolic dysfunction (pseudonormalization). Elevated left atrial  pressure. The average left ventricular  global longitudinal strain is -11.3 %. The global longitudinal strain is  abnormal.  2. Right ventricular systolic function is normal. The right ventricular  size is mildly enlarged. There is moderately elevated pulmonary artery  systolic pressure. The estimated right ventricular systolic pressure is  38.7 mmHg.  3. Left atrial size was severely dilated.  4. Right atrial size was moderately dilated.  5. The mitral valve is grossly normal. Trivial mitral valve   regurgitation. No evidence of mitral stenosis.  6. The aortic valve is tricuspid. Aortic valve regurgitation is trivial.  No aortic stenosis is present.  7. The inferior vena cava is dilated in size with <50% respiratory  variability, suggesting right atrial pressure of 15 mmHg.    Neuro/Psych negative neurological ROS  negative psych ROS   GI/Hepatic Neg liver ROS, FOBT +  admitted to the hospital from October 22 to April 05 2020 for symptomatic anemia, hypertensive emergency, AKI and acute heart failure exacerbation.  At that time he was treated with IV Lasix and his hemoglobin improved after transfusion.  He was discharged on antihypertensives as well as Lasix and was advised to follow-up with GI for colonoscopy.  Patient however did not make it to his colonoscopy appointment.  Also stopped taking all antihypertensives, presented to ED again in decompensated HF   Endo/Other  diabetes, Type 2  Renal/GU CRFRenal diseaseCKD 4, Cr 4.84  negative genitourinary   Musculoskeletal negative musculoskeletal ROS (+)   Abdominal   Peds  Hematology  (+) Blood dyscrasia, anemia , Hb ^.4 on admission, up to 7.9 ABLA 2/2 GIB   Anesthesia Other Findings   Reproductive/Obstetrics negative OB ROS                            Anesthesia Physical Anesthesia Plan  ASA: IV  Anesthesia Plan: MAC   Post-op Pain Management:    Induction: Intravenous  PONV Risk Score and Plan: Propofol infusion, TIVA and Treatment may vary due to age or medical condition  Airway Management Planned: Natural Airway and Simple Face Mask  Additional Equipment: None  Intra-op Plan:   Post-operative Plan:   Informed Consent: I have reviewed the  patients History and Physical, chart, labs and discussed the procedure including the risks, benefits and alternatives for the proposed anesthesia with the patient or authorized representative who has indicated his/her understanding and  acceptance.       Plan Discussed with: CRNA  Anesthesia Plan Comments:        Anesthesia Quick Evaluation

## 2020-08-30 NOTE — Progress Notes (Signed)
[facility] Progress Note  CC:  Anemia, FOBT  positive   Subjective:  No complaints. He is tolerating a clear liquid diet. No BM yet today but feels he will soon. No abdominal pain. No CP or SOB.    Objective:  Vital signs in last 24 hours: Temp:  [97.4 F (36.3 C)-98.4 F (36.9 C)] 97.8 F (36.6 C) (03/22 1130) Pulse Rate:  [41-132] 74 (03/22 1130) Resp:  [8-24] 17 (03/22 1130) BP: (143-213)/(39-151) 143/70 (03/22 1130) SpO2:  [75 %-100 %] 100 % (03/22 1130) Weight:  [81.5 kg] 81.5 kg (03/22 0313) Last BM Date: 08/29/20   General:   Alert 65 year old male in NAD.  Heart: RRR, no murmur.  Pulm:  Breath sounds clear throughout.  Abdomen: Soft, nondistended. Nontender.  Extremities:  Without edema. Neurologic:  Alert and  oriented x4. Grossly normal neurologically. Psych:  Alert and cooperative. Normal mood and affect.  Intake/Output from previous day: 03/21 0701 - 03/22 0700 In: 1500 [P.O.:480; I.V.:137.4; Blood:813.3; IV Piggyback:69.3] Out: 1875 [Urine:1875] Intake/Output this shift: No intake/output data recorded.  Lab Results: Recent Labs    08/29/20 0755 08/30/20 0201  WBC 7.0 6.3  HGB 6.4* 7.9*  HCT 22.2* 24.6*  PLT 201 198   BMET Recent Labs    08/29/20 0755 08/29/20 1338 08/30/20 0201  NA 138 139 138  K 5.4* 4.9 4.7  CL 112* 113* 112*  CO2 16* 18* 17*  GLUCOSE 95 88 80  BUN 61* 60* 62*  CREATININE 5.01* 4.89* 4.84*  CALCIUM 7.5* 7.4* 7.7*   LFT Recent Labs    08/30/20 0201  PROT 6.3*  ALBUMIN 2.3*  AST 31  ALT 28  ALKPHOS 70  BILITOT 1.2   PT/INR Recent Labs    08/29/20 1338  LABPROT 14.1  INR 1.1   Hepatitis Panel No results for input(s): HEPBSAG, HCVAB, HEPAIGM, HEPBIGM in the last 72 hours.  DG Chest Port 1 View  Result Date: 08/29/2020 CLINICAL DATA:  Shortness of breath EXAM: PORTABLE CHEST 1 VIEW COMPARISON:  04/02/2020 FINDINGS: Cardiomegaly. Cardiac silhouette appears slightly increased in size from  prior, which could be accentuated by technique. There is pulmonary vascular congestion and mild diffuse interstitial prominence. No focal airspace consolidation. No pleural effusion or pneumothorax. IMPRESSION: 1. Cardiomegaly with pulmonary vascular congestion and mild diffuse interstitial prominence, suggesting CHF with mild interstitial edema. 2. Cardiac silhouette appears slightly more prominent compared to prior study. Underlying pericardial effusion not excluded. Electronically Signed   By: Davina Poke D.O.   On: 08/29/2020 08:27    Assessment / Plan:  66.  65 year old with iron deficiency anemia admitted to the hospital 08/29/2020 with shortness of breath and lower extremity edema.  Hemoglobin level 6.4 (Hg level 10.3 on 04/05/2020). Transfused 1 unit of PRBCs 3/22 -> Hg 7.9. No overt GI bleeding.  -Clear liquid diet then NPO after midnight EGD and colonoscopy 08/31/2020, benefits and risks discussed including risk with sedation, risk of bleeding, perforation and infection.  -Change PPI infusion to Pantoprazole '40mg'$  IV bid  -IV fluids per the medical service  -CBC and BMP in am  -Further recommendations per Dr. Rush Landmark   2. CHF. LE edema. LV EF 40 - 45%.  3. AK on CKD. Cr level 5. 01 -> 4.84. (Cr 3.92 and BUN 57 on 04/05/2020)  4. Diffuse rash to chest, abdomen, arms and legs -Defer evaluation and management to the medical service   5. DM II  6. HTN, better controlled  today   Active Problems:   Hypertensive crisis     LOS: 1 day   Noralyn Pick  08/30/2020, 12:42 PM

## 2020-08-30 NOTE — Progress Notes (Signed)
Paged by RN for elevated BP. Patient resting comfortably in bed. States he is feeling well, swelling in legs much improved and no longer feeling short of breath. States he had to stop Imdur due to it being too expensive.  Denies headache, nausea, vomiting, lightheaded, dizziness, or weakness.  Blood pressure (!) 193/95, pulse 73, temperature 97.7 F (36.5 C), temperature source Oral, resp. rate 19, height '5\' 7"'$  (1.702 m), weight 74.8 kg, SpO2 100 %. Constitutional: appears comfortable, laying flat in bed, no acute distress Pulmonary: clear to ascultation, no crackles Cardiovascular: regular rate and rhythm, 2+ bilateral lower extremity edema Abdomen: flat, soft  Plan BP remains elevated after restarting amlodipine 5 mg and coreg 6.25 mg. Patient is asymptomatic and resting comfortable. Volume status appears much improved with resolution of orthopnea. Will restart imdur and give additional amlodipine 5 mg.   Addendum  Paged at [phone] by RN at BP remains elevated at 190/88. Was given imdur and amlodipine at 2151. Patient sleeping and remains asymptomatic. However as his BP remains high and presented with AKI will start on nitro drip  With goal BP 170-190 and continue to monitor.

## 2020-08-30 NOTE — TOC Initial Note (Signed)
Transition of Care (TOC) - Initial/Assessment Note  Marvetta Gibbons RN, BSN Transitions of Care Unit 4E- RN Case Manager See Treatment Team for direct phone #   Patient Details  Name: Jeffrey Zuniga MRN: [id number] Date of Birth: [birth year]  Transition of Care [facility]) CM/SW Contact:    [name], RN Phone Number: 08/30/2020, 4:27 PM  Clinical Narrative:                 Referral received for PCP and medication needs. CM spoke with pt at bedside- per pt he has health insurance, and used to go to [facility]- saw Dr. Ronnald Ramp- however he changed insurance last year and then his PCP did not accept his new insurance and the cost was to high out of pocket per pt to continue to be seen there and he never found a new primary provider. Pt voices he wants to find a new PCP- pt provided this Probation officer with his insurance card- verified that it is same card scanned into epic.  Address and phone # also confirmed with pt in epic.  Per pt he is suppose to have pharmacy benefits as well, but has never used the coverage so not sure what his medications cost under the plan.   Call made to admitting to see if they can assist with verifying pt's insurance coverage. Info provided and they are to return call to this writer regarding what they find out about coverage.   Once insurance if verified- TOC will assist in finding pt a new PCP- either in network with insurance or with one of the clinics that are within [facility]- pt is agreeable to this.   Expected Discharge Plan: Home/Self Care Barriers to Discharge: Continued Medical Work up,Inadequate or no insurance   Patient Goals and CMS Choice Patient states their goals for this hospitalization and ongoing recovery are:: return home, find new PCP   Choice offered to / list presented to : NA  Expected Discharge Plan and Services Expected Discharge Plan: Home/Self Care In-house Referral: PCP / [facility] Discharge Planning Services: [facility]   Living arrangements for the past 2 months: Single Family Home                                      Prior Living Arrangements/Services Living arrangements for the past 2 months: Single Family Home Lives with:: Self Patient language and need for interpreter reviewed:: Yes Do you feel safe going back to the place where you live?: Yes      Need for Family Participation in Patient Care: Yes (Comment) Care giver support system in place?: Yes (comment)   Criminal Activity/Legal Involvement Pertinent to Current Situation/Hospitalization: No - Comment as needed  Activities of Daily Living Home Assistive Devices/Equipment: Eyeglasses ADL Screening (condition at time of admission) Patient's cognitive ability adequate to safely complete daily activities?: Yes Is the patient deaf or have difficulty hearing?: No Does the patient have difficulty seeing, even when wearing glasses/contacts?: No Does the patient have difficulty concentrating, remembering, or making decisions?: No Patient able to express need for assistance with ADLs?: Yes Does the patient have difficulty dressing or bathing?: No Independently performs ADLs?: Yes (appropriate for developmental age) Does the patient have difficulty walking or climbing stairs?: No Weakness of Legs: Both Weakness of Arms/Hands: None  Permission Sought/Granted Permission sought to share information with : PCP Permission granted to share  information with : Yes, Verbal Permission Granted     Permission granted to share info w AGENCY: PCP clinics        Emotional Assessment Appearance:: Appears stated age Attitude/Demeanor/Rapport: Engaged Affect (typically observed): Appropriate,Pleasant Orientation: : Oriented to Self,Oriented to Place,Oriented to  Time,Oriented to Situation   Psych Involvement: No (comment)  Admission diagnosis:  Hypertensive crisis [I16.9] Acute on chronic combined systolic and  diastolic congestive heart failure (HCC) [I50.43] Symptomatic anemia [D64.9] Patient Active Problem List   Diagnosis Date Noted  . Hypertensive crisis 08/29/2020  . Acute on chronic combined systolic and diastolic congestive heart failure ([place])   . LVH (left ventricular hypertrophy) 04/04/2020  . Iron deficiency anemia 04/02/2020  . Acute heart failure with reduced ejection fraction and diastolic dysfunction ([place]) 04/02/2020  . Symptomatic anemia 04/01/2020  . Hypertensive emergency 04/01/2020  . Acute kidney injury ([place]) 04/01/2020  . Bilateral lower extremity edema 04/01/2020   PCP:  Patient, No Pcp Per Pharmacy:   [place], [address] [place] [address] Phone: [phone] Fax: [phone]     Social Determinants of Health (SDOH) Interventions    Readmission Risk Interventions No flowsheet data found.

## 2020-08-30 NOTE — Progress Notes (Signed)
Heart Failure Navigator Progress Note  Assessed for Heart & Vascular TOC clinic readiness.  Unfortunately at this time the patient does not meet criteria due to SCr continues to rise, currently 4.87.   Navigator available for reassessment of patient.   Pricilla Holm, RN, BSN Heart Failure Nurse Navigator [phone]

## 2020-08-30 NOTE — Progress Notes (Signed)
Mobility Specialist - Progress Note   08/30/20 1509  Mobility  Activity Ambulated in hall  Level of Assistance Independent  Assistive Device None  Distance Ambulated (ft) 300 ft  Mobility Response Tolerated well  Mobility performed by Mobility specialist  $Mobility charge 1 Mobility   Pre-mobility: 68 HR, 150/80 BP Post-mobility: 71 HR, 165/92 BP  Pt asx throughout ambulation. Pt to recliner after walk, call bell in reach.   Pricilla Handler Mobility Specialist Mobility Specialist Phone: [phone]

## 2020-08-30 NOTE — H&P (View-Only) (Signed)
[facility] Progress Note  CC:  Anemia, FOBT  positive   Subjective:  No complaints. He is tolerating a clear liquid diet. No BM yet today but feels he will soon. No abdominal pain. No CP or SOB.    Objective:  Vital signs in last 24 hours: Temp:  [97.4 F (36.3 C)-98.4 F (36.9 C)] 97.8 F (36.6 C) (03/22 1130) Pulse Rate:  [41-132] 74 (03/22 1130) Resp:  [8-24] 17 (03/22 1130) BP: (143-213)/(39-151) 143/70 (03/22 1130) SpO2:  [75 %-100 %] 100 % (03/22 1130) Weight:  [81.5 kg] 81.5 kg (03/22 0313) Last BM Date: 08/29/20   General:   Alert 65 year old male in NAD.  Heart: RRR, no murmur.  Pulm:  Breath sounds clear throughout.  Abdomen: Soft, nondistended. Nontender.  Extremities:  Without edema. Neurologic:  Alert and  oriented x4. Grossly normal neurologically. Psych:  Alert and cooperative. Normal mood and affect.  Intake/Output from previous day: 03/21 0701 - 03/22 0700 In: 1500 [P.O.:480; I.V.:137.4; Blood:813.3; IV Piggyback:69.3] Out: 1875 [Urine:1875] Intake/Output this shift: No intake/output data recorded.  Lab Results: Recent Labs    08/29/20 0755 08/30/20 0201  WBC 7.0 6.3  HGB 6.4* 7.9*  HCT 22.2* 24.6*  PLT 201 198   BMET Recent Labs    08/29/20 0755 08/29/20 1338 08/30/20 0201  NA 138 139 138  K 5.4* 4.9 4.7  CL 112* 113* 112*  CO2 16* 18* 17*  GLUCOSE 95 88 80  BUN 61* 60* 62*  CREATININE 5.01* 4.89* 4.84*  CALCIUM 7.5* 7.4* 7.7*   LFT Recent Labs    08/30/20 0201  PROT 6.3*  ALBUMIN 2.3*  AST 31  ALT 28  ALKPHOS 70  BILITOT 1.2   PT/INR Recent Labs    08/29/20 1338  LABPROT 14.1  INR 1.1   Hepatitis Panel No results for input(s): HEPBSAG, HCVAB, HEPAIGM, HEPBIGM in the last 72 hours.  DG Chest Port 1 View  Result Date: 08/29/2020 CLINICAL DATA:  Shortness of breath EXAM: PORTABLE CHEST 1 VIEW COMPARISON:  04/02/2020 FINDINGS: Cardiomegaly. Cardiac silhouette appears slightly increased in size from  prior, which could be accentuated by technique. There is pulmonary vascular congestion and mild diffuse interstitial prominence. No focal airspace consolidation. No pleural effusion or pneumothorax. IMPRESSION: 1. Cardiomegaly with pulmonary vascular congestion and mild diffuse interstitial prominence, suggesting CHF with mild interstitial edema. 2. Cardiac silhouette appears slightly more prominent compared to prior study. Underlying pericardial effusion not excluded. Electronically Signed   By: Davina Poke D.O.   On: 08/29/2020 08:27    Assessment / Plan:  37.  65 year old with iron deficiency anemia admitted to the hospital 08/29/2020 with shortness of breath and lower extremity edema.  Hemoglobin level 6.4 (Hg level 10.3 on 04/05/2020). Transfused 1 unit of PRBCs 3/22 -> Hg 7.9. No overt GI bleeding.  -Clear liquid diet then NPO after midnight EGD and colonoscopy 08/31/2020, benefits and risks discussed including risk with sedation, risk of bleeding, perforation and infection.  -Change PPI infusion to Pantoprazole '40mg'$  IV bid  -IV fluids per the medical service  -CBC and BMP in am  -Further recommendations per Dr. Rush Landmark   2. CHF. LE edema. LV EF 40 - 45%.  3. AK on CKD. Cr level 5. 01 -> 4.84. (Cr 3.92 and BUN 57 on 04/05/2020)  4. Diffuse rash to chest, abdomen, arms and legs -Defer evaluation and management to the medical service   5. DM II  6. HTN, better controlled  today   Active Problems:   Hypertensive crisis     LOS: 1 day   Noralyn Pick  08/30/2020, 12:42 PM

## 2020-08-30 NOTE — Progress Notes (Addendum)
HD#1 Subjective:  Overnight Events: Admitted  Patient sitting up at bedside, states he feels much better.  States his breathing is improved as well as lower extremity swelling.  Denies headaches from the nitro drip.  Discussed with him plan was for EGD/colonoscopy tomorrow patient will be n.p.o. at midnight.  Jeffrey Zuniga states that he ran out of his medications 2 weeks ago.  Switched health insurance plans, and his old PCP was not covered by this insurance.  During this time he ran out of his home medications.  Objective:  Vital signs in last 24 hours: Vitals:   08/30/20 0741 08/30/20 0907 08/30/20 1000 08/30/20 1130  BP: (!) 152/69 (!) 171/85  (!) 143/70  Pulse: 82   74  Resp: 20   17  Temp: 98 F (36.7 C)  97.7 F (36.5 C) 97.8 F (36.6 C)  TempSrc: Oral  Oral Oral  SpO2: 100%   100%  Weight:      Height:       Supplemental O2: Room Air SpO2: 100 %   Physical Exam:  Constitutional: well-appearing, in no acute distress HENT: normocephalic atraumatic Eyes: conjunctiva non-erythematous Neck: supple Cardiovascular: regular rate and rhythm, systolic murmur 2/6 best heard over left fifth intercostal space. Pulmonary/Chest: normal work of breathing on room air, lungs clear to auscultation bilaterally Abdominal: soft, non-tender, non-distended MSK: normal bulk and tone Neurological: alert & oriented x 3 Skin: warm and dry, 1+ lower extremity edema.  Scaly plaques on bilateral lower as well as upper extremities.  Small plaques on back. Psych: Normal mood  Filed Weights   08/29/20 0811 08/30/20 0313  Weight: 74.8 kg 81.5 kg     Intake/Output Summary (Last 24 hours) at 08/30/2020 1148 Last data filed at 08/30/2020 0420 Gross per 24 hour  Intake 1499.99 ml  Output 1875 ml  Net -375.01 ml   Net IO Since Admission: -375.01 mL [08/30/20 1148]  Pertinent Labs: CBC Latest Ref Rng & Units 08/30/2020 08/29/2020 05/02/2020  WBC 4.0 - 10.5 K/uL 6.3 7.0 7.7  Hemoglobin 13.0 -  17.0 g/dL 7.9(L) 6.4(LL) 8.6 Repeated and verified X2.(L)  Hematocrit 39.0 - 52.0 % 24.6(L) 22.2(L) 26.8(L)  Platelets 150 - 400 K/uL 198 201 146.0(L)    CMP Latest Ref Rng & Units 08/30/2020 08/29/2020 08/29/2020  Glucose 70 - 99 mg/dL 80 88 95  BUN 8 - 23 mg/dL 62(H) 60(H) 61(H)  Creatinine 0.61 - 1.24 mg/dL 4.84(H) 4.89(H) 5.01(H)  Sodium 135 - 145 mmol/L 138 139 138  Potassium 3.5 - 5.1 mmol/L 4.7 4.9 5.4(H)  Chloride 98 - 111 mmol/L 112(H) 113(H) 112(H)  CO2 22 - 32 mmol/L 17(L) 18(L) 16(L)  Calcium 8.9 - 10.3 mg/dL 7.7(L) 7.4(L) 7.5(L)  Total Protein 6.5 - 8.1 g/dL 6.3(L) - 6.9  Total Bilirubin 0.3 - 1.2 mg/dL 1.2 - 0.8  Alkaline Phos 38 - 126 U/L 70 - 83  AST 15 - 41 U/L 31 - 46(H)  ALT 0 - 44 U/L 28 - 33    Imaging: No results found.  Assessment/Plan:   Active Problems:   Hypertensive crisis   Patient Summary:  65 y.o. male with a history of HFrEF, renal failure(likely CKD), IDA 2/2 GIB, and prediabetes who is presenting with a 2-week history of worsening shortness of breath, DOE, lower extremity swelling, weight gain, melena, and fatigue after running out of his medications 2 weeks ago.     Hypertensive emergency:  Blood pressure improving well, he was started on nitro drip  overnight.  Map of 110 this morning.  He denies headaches with nitro drip.  We will transition off as pressures in the 140s/70s, will restart patient on Imdur.  We will continue to monitor closely.  -Continue home amlodipine and Coreg. -DC nitro drip, restart Imdur -Continue on telemetry -Frequent vital checks -Monitor BMP renal function as well -TOC consult for PCP/medication needs  Acute on chronic HFrEF: Patient endorses her breathing improving and feels overall better.  Will give additional dose of IV Lasix 40 mg.  Continue to trend urinary output as well as weights.  Did not appreciate any bibasilar crackles today, overall improving well.  Due to patient's improvement, will repeat  echocardiogram tomorrow.  This will help [place] determine outpatient medication management. -IV Lasix 40 mg once -Continue home Coreg, Imdur -Daily weights -Strict I+Os- -375 mL -Place on telemetry -Repeat echocardiogram tomorrow  GI bleed: Patient received 1 unit PPRC's yesterday, hemoglobin responded well at 7.9. Patient to be n.p.o. at midnight and plan for endoscopy/colonoscopy tomorrow. -GI consulted, appreciate recommendations -Hemoglobin of 7.9 -Continue Protonix drip -N.p.o. tomorrow -Daily CBC   Acute on chronic kidney failure:  Metabolic acidosis: Hyperkalemia: Creatinine has improved to 4.84 from 5.01 on admission.  Potassium has come down nicely to 4.7.  Bicarb improving as well.  Patient with proteinuria on UA, will order protein to creatinine ratio. Patient with good urinary output.  Expect creatinine to continue improve with diuresis. -Continue to monitor BMP -Diurese as needed -Consider nephrology consult if continues to worsen  Diffuse rash Patient states that rash started after his first admission 3 months ago.  Will e-consult dermatology as no dermatology inpatient at [facility] -Continue betamethasone lotion -[place] dermatology consult  Diet: Clear liquids, n.p.o. at midnight IVF: None,None VTE: SCDs Code: Full PT/OT recs: None  Dispo: Anticipated discharge to Home in 3-4 days pending further work-up and evaluation  [place] Resident PGY-1 Pager [phone] Please contact the on call pager after 5 pm and on weekends at [phone].

## 2020-08-31 ENCOUNTER — Encounter (HOSPITAL_COMMUNITY)
Admission: EM | Disposition: A | Discharge status: Home / Self Care | Payer: Self-pay | Source: Home / Self Care | Attending: Internal Medicine

## 2020-08-31 ENCOUNTER — Inpatient Hospital Stay (HOSPITAL_COMMUNITY): Payer: 59

## 2020-08-31 ENCOUNTER — Inpatient Hospital Stay (HOSPITAL_COMMUNITY): Payer: 59 | Admitting: Anesthesiology

## 2020-08-31 ENCOUNTER — Encounter (HOSPITAL_COMMUNITY): Payer: Self-pay | Admitting: Internal Medicine

## 2020-08-31 DIAGNOSIS — K635 Polyp of colon: Secondary | ICD-10-CM

## 2020-08-31 DIAGNOSIS — K3189 Other diseases of stomach and duodenum: Secondary | ICD-10-CM

## 2020-08-31 DIAGNOSIS — K2289 Other specified disease of esophagus: Secondary | ICD-10-CM

## 2020-08-31 HISTORY — PX: ESOPHAGOGASTRODUODENOSCOPY (EGD) WITH PROPOFOL: SHX5813

## 2020-08-31 HISTORY — PX: COLONOSCOPY WITH PROPOFOL: SHX5780

## 2020-08-31 HISTORY — PX: BIOPSY: SHX5522

## 2020-08-31 HISTORY — PX: POLYPECTOMY: SHX5525

## 2020-08-31 LAB — MAGNESIUM: Magnesium: 1.7 mg/dL (ref 1.7–2.4)

## 2020-08-31 LAB — CBC
HCT: 26.4 % — ABNORMAL LOW (ref 39.0–52.0)
Hemoglobin: 8.4 g/dL — ABNORMAL LOW (ref 13.0–17.0)
MCH: 28.5 pg (ref 26.0–34.0)
MCHC: 31.8 g/dL (ref 30.0–36.0)
MCV: 89.5 fL (ref 80.0–100.0)
Platelets: 219 10*3/uL (ref 150–400)
RBC: 2.95 MIL/uL — ABNORMAL LOW (ref 4.22–5.81)
RDW: 16.7 % — ABNORMAL HIGH (ref 11.5–15.5)
WBC: 6.7 10*3/uL (ref 4.0–10.5)
nRBC: 0 % (ref 0.0–0.2)

## 2020-08-31 LAB — BASIC METABOLIC PANEL
Anion gap: 9 (ref 5–15)
BUN: 58 mg/dL — ABNORMAL HIGH (ref 8–23)
CO2: 15 mmol/L — ABNORMAL LOW (ref 22–32)
Calcium: 7.7 mg/dL — ABNORMAL LOW (ref 8.9–10.3)
Chloride: 111 mmol/L (ref 98–111)
Creatinine, Ser: 4.87 mg/dL — ABNORMAL HIGH (ref 0.61–1.24)
GFR, Estimated: 13 mL/min — ABNORMAL LOW (ref >60)
Glucose, Bld: 90 mg/dL (ref 70–99)
Potassium: 4.9 mmol/L (ref 3.5–5.1)
Sodium: 135 mmol/L (ref 135–145)

## 2020-08-31 LAB — PROTEIN / CREATININE RATIO, URINE
Creatinine, Urine: 67.77 mg/dL
Protein Creatinine Ratio: 5.24 mg/mg{Cre} — ABNORMAL HIGH (ref 0.00–0.15)
Total Protein, Urine: 355 mg/dL

## 2020-08-31 LAB — GLUCOSE, CAPILLARY: Glucose-Capillary: 78 mg/dL (ref 70–99)

## 2020-08-31 LAB — PHOSPHORUS: Phosphorus: 6.2 mg/dL — ABNORMAL HIGH (ref 2.5–4.6)

## 2020-08-31 LAB — SODIUM, URINE, RANDOM: Sodium, Ur: 95 mmol/L

## 2020-08-31 IMAGING — US US RENAL
1 series · 14 of 25 positions shown · non-contrast
Comparison: None.

CLINICAL DATA: Proteinuria

EXAM:
RENAL / URINARY TRACT ULTRASOUND COMPLETE

[Series 1: us renal · 14 of 33 slices shown]
[im 1/33]
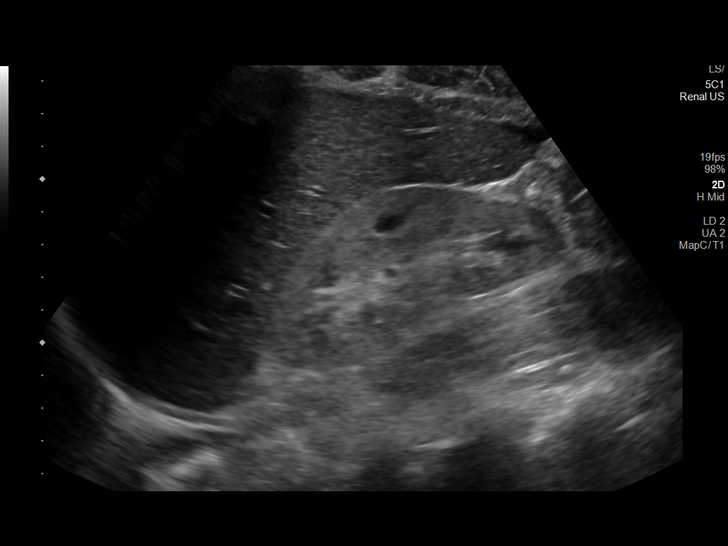
[im 3/33]
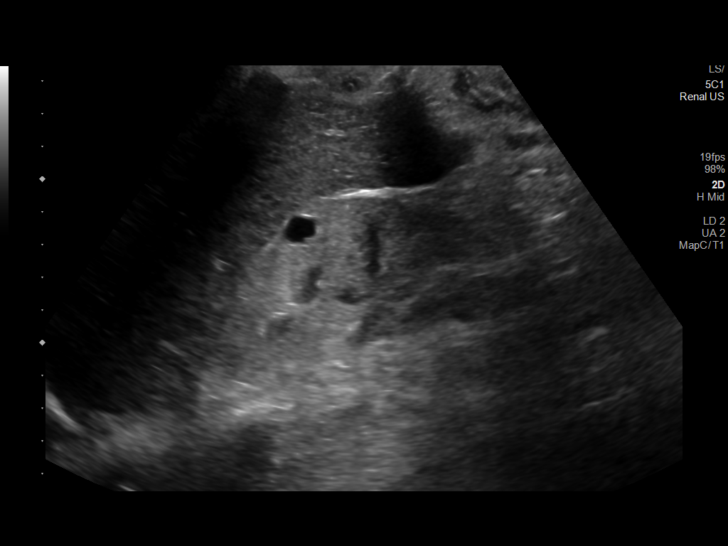
[im 6/33]
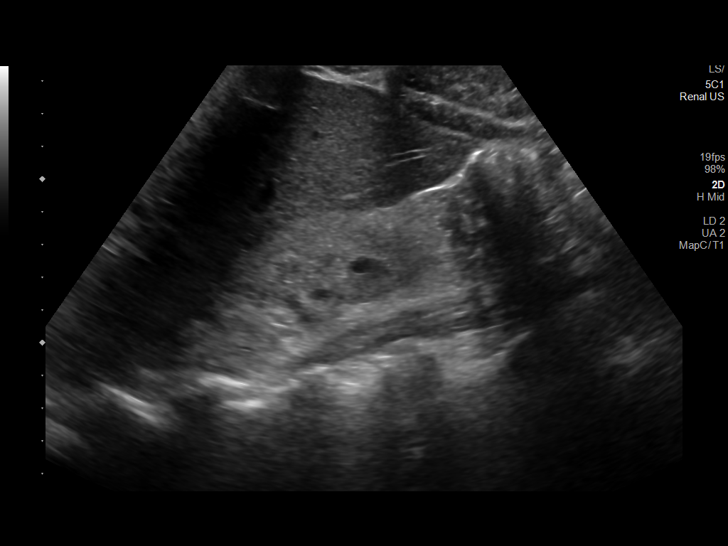
[im 9/33]
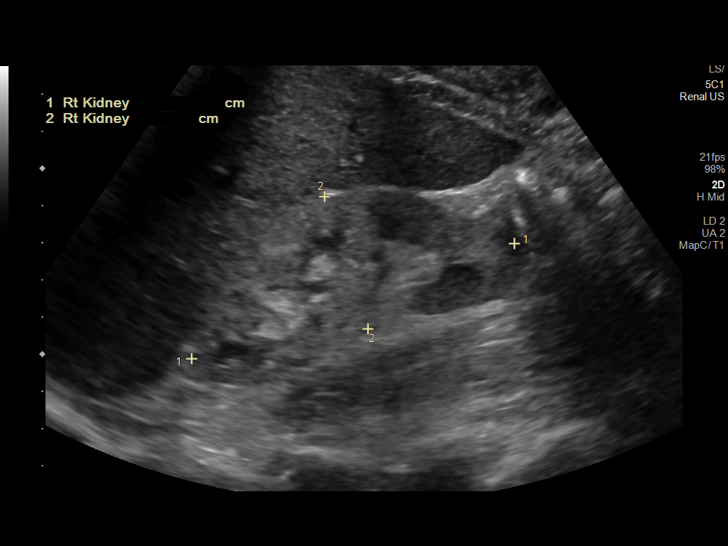
[im 11/33]
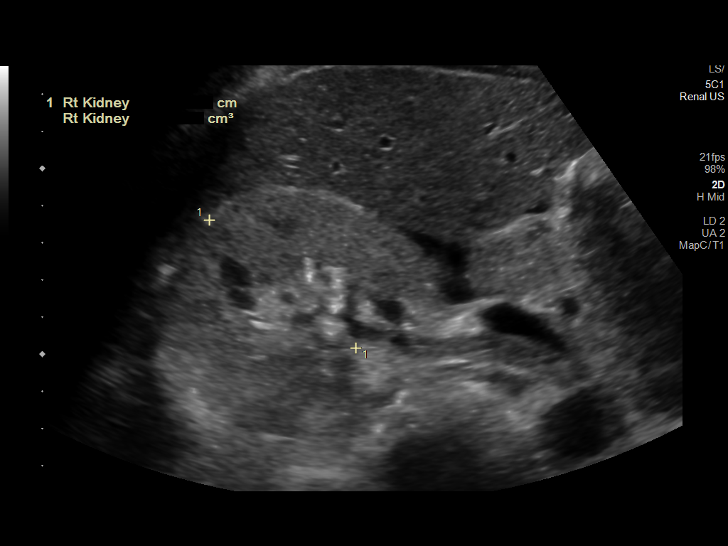
[im 13/33]
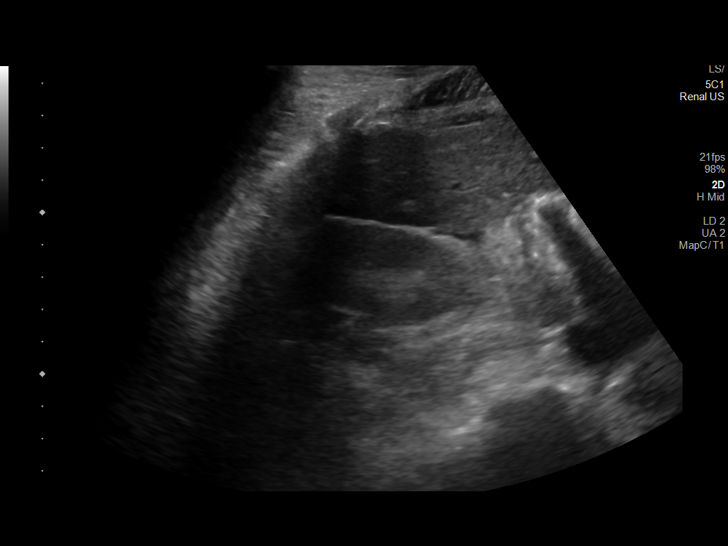
[im 15/33]
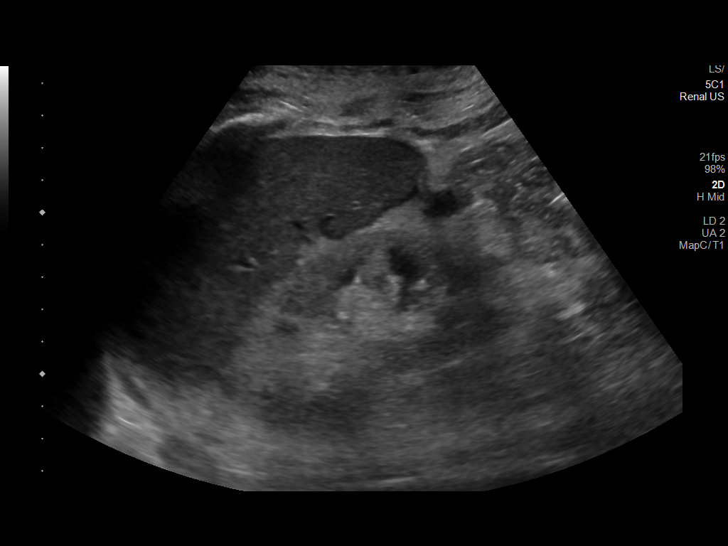
[im 18/33]
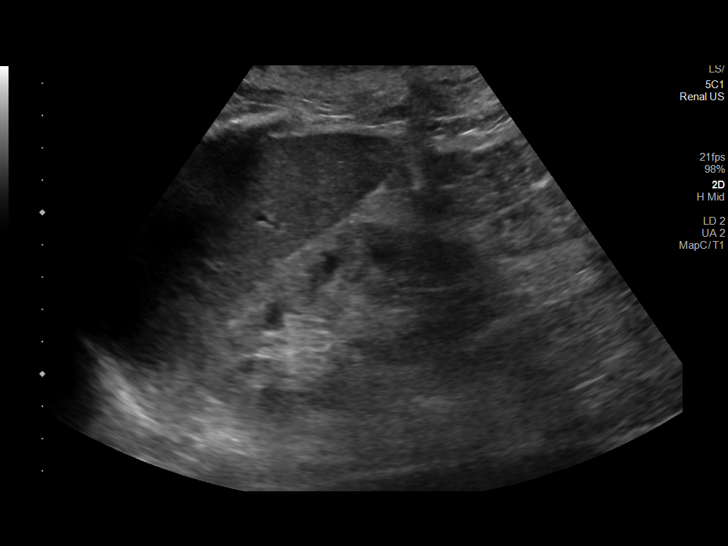
[im 21/33]
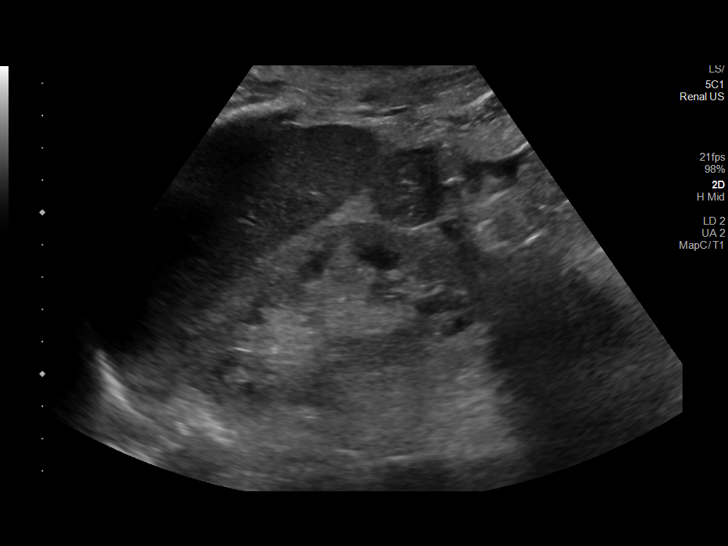
[im 22/33]
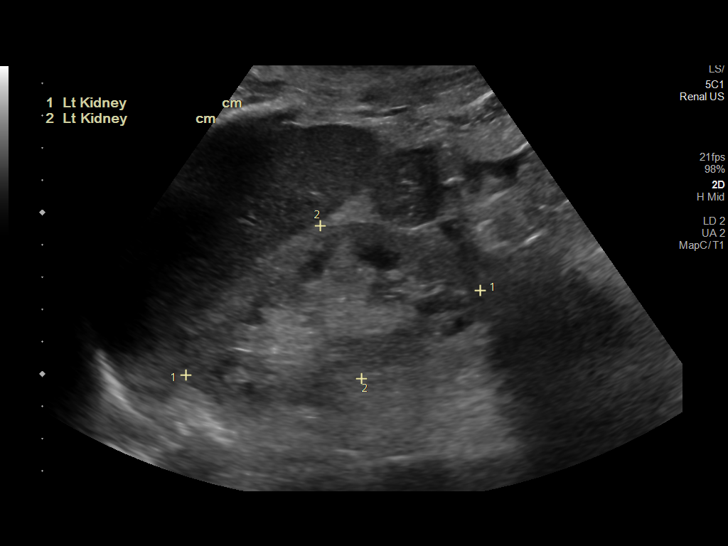
[im 25/33]
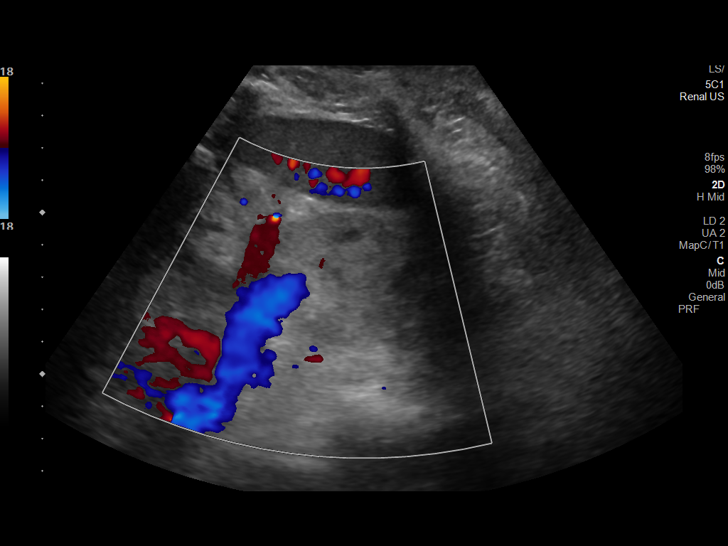
[im 27/33]
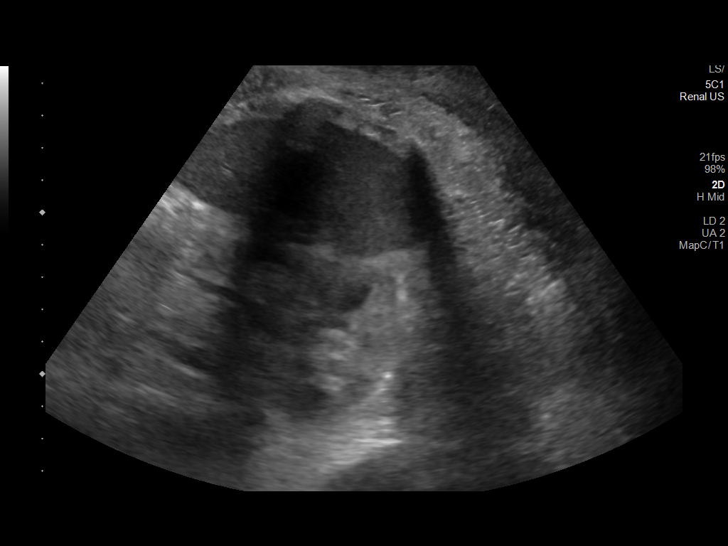
[im 30/33]
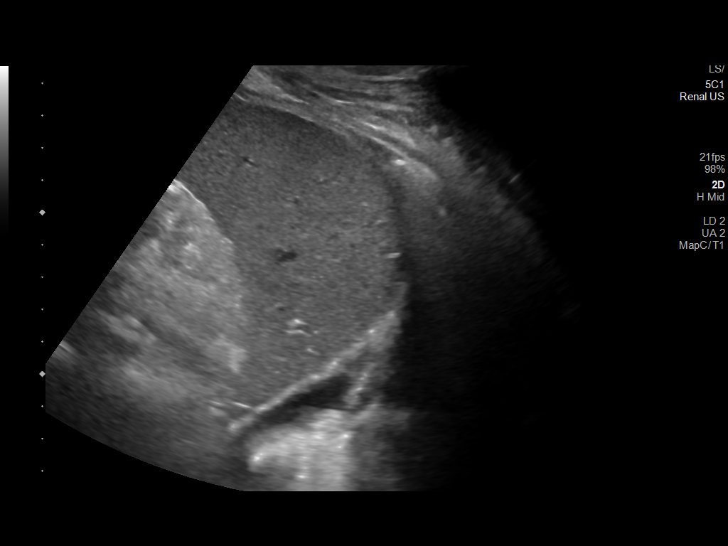
[im 33/33]
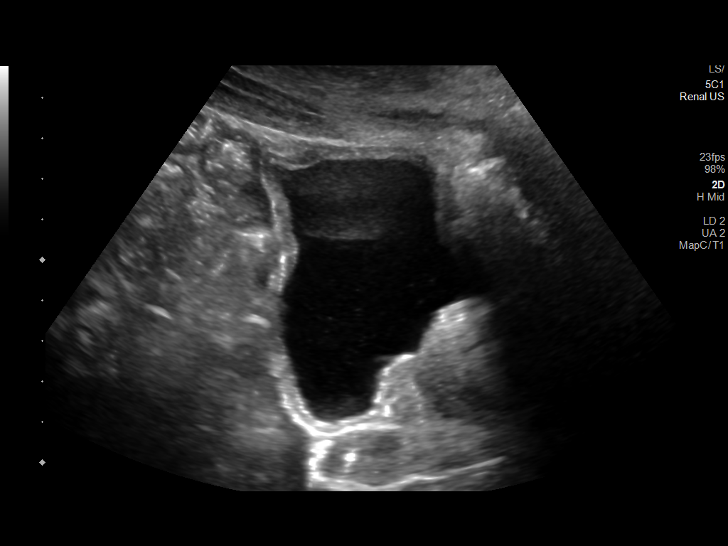

[14 of 25 positions shown; findings below may reference images not displayed]

FINDINGS: Right Kidney:

Renal measurements: 9.2 x 3.8 x 5.2 cm = volume: 95 ML. Diffusely
increased echotexture. No mass or hydronephrosis.

Left Kidney:

Renal measurements: 9.5 x 4.9 x 3.9 cm = volume: 94 mL. Diffusely
increased echotexture. No mass or hydronephrosis.

Bladder:

Appears normal for degree of bladder distention.

Other:

Trace right upper quadrant ascites and left pleural effusion.
IMPRESSION: Increased echotexture within the kidneys compatible with chronic
medical renal disease. No hydronephrosis.

Trace ascites and left effusion.

## 2020-08-31 SURGERY — ESOPHAGOGASTRODUODENOSCOPY (EGD) WITH PROPOFOL
Anesthesia: Monitor Anesthesia Care

## 2020-08-31 MED ORDER — SODIUM CHLORIDE 0.9 % IV SOLN
250.0000 mg | Freq: Once | INTRAVENOUS | Status: AC
  Administered 2020-08-31: 250 mg via INTRAVENOUS
  Filled 2020-08-31: qty 20

## 2020-08-31 MED ORDER — AMLODIPINE BESYLATE 5 MG PO TABS: 5.0000 mg | ORAL_TABLET | Freq: Every day | ORAL | Status: DC

## 2020-08-31 MED ORDER — PROPOFOL 500 MG/50ML IV EMUL
INTRAVENOUS | Status: DC | PRN
  Administered 2020-08-31: 125 ug/kg/min via INTRAVENOUS

## 2020-08-31 MED ORDER — AMLODIPINE BESYLATE 10 MG PO TABS: 10.0000 mg | ORAL_TABLET | Freq: Every day | ORAL | Status: DC

## 2020-08-31 MED ORDER — PROPOFOL 10 MG/ML IV BOLUS
INTRAVENOUS | Status: DC | PRN
  Administered 2020-08-31 (×3): 20 mg via INTRAVENOUS

## 2020-08-31 MED ORDER — PANTOPRAZOLE SODIUM 40 MG PO TBEC
40.0000 mg | DELAYED_RELEASE_TABLET | Freq: Every day | ORAL | Status: DC
  Administered 2020-09-01: 40 mg via ORAL
  Filled 2020-08-31: qty 1

## 2020-08-31 MED ORDER — SODIUM CHLORIDE 0.9 % IV SOLN: INTRAVENOUS | Status: DC

## 2020-08-31 SURGICAL SUPPLY — 25 items

## 2020-08-31 NOTE — Anesthesia Procedure Notes (Signed)
Procedure Name: MAC Date/Time: 08/31/2020 2:29 PM Performed by: Rande Brunt, CRNA Pre-anesthesia Checklist: Patient identified, Emergency Drugs available, Suction available and Patient being monitored Patient Re-evaluated:Patient Re-evaluated prior to induction Oxygen Delivery Method: Nasal cannula Induction Type: IV induction Placement Confirmation: positive ETCO2 and CO2 detector Dental Injury: Teeth and Oropharynx as per pre-operative assessment

## 2020-08-31 NOTE — Op Note (Signed)
[facility] Patient Name: Jeffrey Zuniga Procedure Date : 08/31/2020 MRN: [id number] Attending MD: Justice Britain , MD Date of Birth: 1956-03-12 CSN: [id number] Age: 65 Admit Type: Inpatient Procedure:                Colonoscopy Indications:              This is the patient's first colonoscopy, Evaluation                            of unexplained GI bleeding presenting with fecal                            occult blood, Iron deficiency anemia Providers:                Justice Britain, MD, Clyde Lundborg, RN, Ladona Ridgel, Technician Referring MD:             Lajuan Lines. Pyrtle, MD, [facility] Medicines:                Monitored Anesthesia Care Complications:            No immediate complications. Estimated Blood Loss:     Estimated blood loss was minimal. Procedure:                Pre-Anesthesia Assessment:                           - Prior to the procedure, a History and Physical                            was performed, and patient medications and                            allergies were reviewed. The patient's tolerance of                            previous anesthesia was also reviewed. The risks                            and benefits of the procedure and the sedation                            options and risks were discussed with the patient.                            All questions were answered, and informed consent                            was obtained. Prior Anticoagulants: The patient has                            taken no previous anticoagulant or antiplatelet  agents. ASA Grade Assessment: III - A patient with                            severe systemic disease. After reviewing the risks                            and benefits, the patient was deemed in                            satisfactory condition to undergo the procedure.                           After obtaining informed consent, the  colonoscope                            was passed under direct vision. Throughout the                            procedure, the patient's blood pressure, pulse, and                            oxygen saturations were monitored continuously. The                            CF-HQ190L ([id number]) Olympus colonoscope was                            introduced through the anus and advanced to the 5                            cm into the ileum. The colonoscopy was performed                            without difficulty. The patient tolerated the                            procedure. The quality of the bowel preparation was                            adequate. The terminal ileum, ileocecal valve,                            appendiceal orifice, and rectum were photographed. Scope In: 2:36:16 PM Scope Out: 2:49:32 PM Scope Withdrawal Time: 0 hours 10 minutes 39 seconds  Total Procedure Duration: 0 hours 13 minutes 16 seconds  Findings:      The digital rectal exam findings include hemorrhoids. Pertinent       negatives include no palpable rectal lesions.      The terminal ileum and ileocecal valve appeared normal.      A 2 mm polyp was found in the transverse colon. The polyp was sessile.       The polyp was removed with a cold snare. Resection and retrieval were       complete.      Normal mucosa was  found in the entire colon otherwise.      Non-bleeding non-thrombosed internal hemorrhoids were found during       retroflexion, during perianal exam and during digital exam. The       hemorrhoids were Grade II (internal hemorrhoids that prolapse but reduce       spontaneously). Impression:               - Hemorrhoids found on digital rectal exam.                           - The examined portion of the ileum was normal.                           - One 2 mm polyp in the transverse colon, removed                            with a cold snare. Resected and retrieved.                           - Normal  mucosa in the entire examined colon                            otherwise.                           - Non-bleeding non-thrombosed internal hemorrhoids. Recommendation:           - The patient will be observed post-procedure,                            until all discharge criteria are met.                           - Return patient to hospital ward for ongoing care.                           - Await pathology results.                           - Will discuss with patient consideration of Video                            Capsule Endoscopy swallow later today vs tomorrow                            vs as outpatient. No overt cause of his IDA is                            noted on today's examinations.                           - Repeat colonoscopy in 7/10 years for surveillance                            based on pathology results and adenomatous tissue  being found.                           - The findings and recommendations were discussed                            with the patient.                           - The findings and recommendations were discussed                            with the referring physician. Procedure Code(s):        --- Professional ---                           [phone], Colonoscopy, flexible; with removal of                            tumor(s), polyp(s), or other lesion(s) by snare                            technique Diagnosis Code(s):        --- Professional ---                           K64.2, Third degree hemorrhoids                           K63.5, Polyp of colon                           R19.5, Other fecal abnormalities                           D50.9, Iron deficiency anemia, unspecified CPT copyright 2019 American Medical Association. All rights reserved. The codes documented in this report are preliminary and upon coder review may  be revised to meet current compliance requirements. Justice Britain, MD 08/31/2020 3:05:34  PM Number of Addenda: 0

## 2020-08-31 NOTE — Progress Notes (Signed)
HD#2 Subjective:  Overnight Events: No new events overnight,   Patient laying in bed, resting comfortably. Reports that he is feeling better today, improvement in his breathing and lower extremity swelling. He denies any lightheadedness, dizziness, chest pain, or other symptoms.   We discussed that we spoke with dermatology regarding his rash, and that they thought that it could be related to his lasix that he has been getting. And that we may switch him to another diuretic to see if his rash improves.   Discussed that he will go for his EGD today. All question and concerns were addressed. Also discussed that we will follow up on the echocardiogram.   Objective:  Vital signs in last 24 hours: Vitals:   08/30/20 1504 08/30/20 1934 08/30/20 2308 08/31/20 0344  BP: (!) 165/92 (!) 182/89 (!) 174/89 (!) 179/85  Pulse: 65 71 68 81  Resp: '20 20 17 16  '$ Temp: 98.1 F (36.7 C) 97.7 F (36.5 C) 98.6 F (37 C) 98.3 F (36.8 C)  TempSrc: Oral Oral Oral Oral  SpO2: 100% 100% 100% 98%  Weight:    79.4 kg  Height:       Supplemental O2: Room Air SpO2: 98 %   Physical Exam:  Constitutional: well-appearing, in no acute distress HENT: normocephalic atraumatic Eyes: conjunctiva non-erythematous Neck: supple Cardiovascular: regular rate and rhythm, systolic murmur 2/6 best heard over left fifth intercostal space. Pulmonary/Chest: No acute respiratory distress, normal work of breathing on room air.  Clear to auscultation Abdominal: soft, non-tender, non-distended MSK: normal bulk and tone Neurological: alert & oriented x 3 Skin: warm and dry, 1+ lower extremity edema.  Scaly plaques on bilateral lower as well as upper extremities.  Small plaques on back. Psych: Normal mood  Filed Weights   08/29/20 0811 08/30/20 0313 08/31/20 0344  Weight: 74.8 kg 81.5 kg 79.4 kg     Intake/Output Summary (Last 24 hours) at 08/31/2020 0721 Last data filed at 08/31/2020 0349 Gross per 24 hour   Intake --  Output 275 ml  Net -275 ml   Net IO Since Admission: -650.01 mL [08/31/20 0721]  Pertinent Labs: CBC Latest Ref Rng & Units 08/31/2020 08/30/2020 08/29/2020  WBC 4.0 - 10.5 K/uL 6.7 6.3 7.0  Hemoglobin 13.0 - 17.0 g/dL 8.4(L) 7.9(L) 6.4(LL)  Hematocrit 39.0 - 52.0 % 26.4(L) 24.6(L) 22.2(L)  Platelets 150 - 400 K/uL 219 198 201    CMP Latest Ref Rng & Units 08/31/2020 08/30/2020 08/29/2020  Glucose 70 - 99 mg/dL 90 80 88  BUN 8 - 23 mg/dL 58(H) 62(H) 60(H)  Creatinine 0.61 - 1.24 mg/dL 4.87(H) 4.84(H) 4.89(H)  Sodium 135 - 145 mmol/L 135 138 139  Potassium 3.5 - 5.1 mmol/L 4.9 4.7 4.9  Chloride 98 - 111 mmol/L 111 112(H) 113(H)  CO2 22 - 32 mmol/L 15(L) 17(L) 18(L)  Calcium 8.9 - 10.3 mg/dL 7.7(L) 7.7(L) 7.4(L)  Total Protein 6.5 - 8.1 g/dL - 6.3(L) -  Total Bilirubin 0.3 - 1.2 mg/dL - 1.2 -  Alkaline Phos 38 - 126 U/L - 70 -  AST 15 - 41 U/L - 31 -  ALT 0 - 44 U/L - 28 -    Imaging: No results found.  Assessment/Plan:   Active Problems:   Hypertensive crisis   Patient Summary:  65 y.o. 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     Hypertensive emergency:  Patient was transitioned to his home blood pressure medications of Imdur, amlodipine, carvedilol.  Patient was responding well yesterday with IV nitro, transition to home blood pressure medications yesterday.  Will increase amlodipine to 10 mg.  We will hold off on increasing Coreg setting of acute decompensated heart failure. -If blood pressure over [id number] systolic, will restart nitro drip -Continue home amlodipine, Imdur and Coreg. -Continue on telemetry -Frequent vital checks -Monitor BMP renal function as well -TOC consult for PCP/medication needs  Acute on chronic  HFrEF: Patient states his breathing is improved.  He was given additional dose of IV Lasix 40 mg once yesterday.  Suspect furosemide may be contributing to patient's rash, will transition to alternative p.o. diuretic as needed. -Transition to alternative p.o. diuretic. -Continue home Coreg, Imdur -Daily weights -Strict I+Os- -375 mL -continue telemetry -Repeat echocardiogram pending  GI bleed: Hemoglobin improving 7.9->8.1.  EGD today, no site of active bleeding found.  We will continue to monitor -GI will follow outpatient for capsule endoscopy -Transition from IV to p.o. Protonix -Daily CBC   Acute on chronic kidney failure:  Metabolic acidosis: Hyperkalemia: Creatinine stable at 4.87.  Uncertain if this is patient's new baseline in setting of chronic hypertension.  Protein to creatinine ratio 5.34.  Will order renal ultrasound.  Fina calculated to be 5%, suspect that this was affected by IV diuretics given yesterday. -Continue to monitor BMP -Renal ultrasound -Diurese as needed -Consider nephrology consult if continues to worsen  Diffuse rash E-consulted dermatology.  Suspect rash is eczematous in etiology and possibly could be patient induced.  Other medications listed, furosemide is possible culprit.  Will transition to alternative p.o. diuretic. -Continue betamethasone lotion -Stop loop diuretic and assess if improvement occurs  Diet: Carb modified diet IVF: None,None VTE: SCDs Code: Full PT/OT recs: None  Dispo: Anticipated discharge to Home in 3-4 days pending further work-up and evaluation  [facility] Resident PGY-1 Pager [phone] Please contact the on call pager after 5 pm and on weekends at [phone].

## 2020-08-31 NOTE — Progress Notes (Signed)
Bladder scan performed per MD order.  21 ml urine detected in bladder.  Will continue to monitor.

## 2020-08-31 NOTE — Anesthesia Postprocedure Evaluation (Signed)
Anesthesia Post Note  Patient: Jeffrey Zuniga  Procedure(s) Performed: ESOPHAGOGASTRODUODENOSCOPY (EGD) WITH PROPOFOL (N/A ) COLONOSCOPY WITH PROPOFOL (N/A ) BIOPSY POLYPECTOMY     Patient location during evaluation: PACU Anesthesia Type: MAC Level of consciousness: awake and alert Pain management: pain level controlled Vital Signs Assessment: post-procedure vital signs reviewed and stable Respiratory status: spontaneous breathing, nonlabored ventilation and respiratory function stable Cardiovascular status: blood pressure returned to baseline and stable Postop Assessment: no apparent nausea or vomiting Anesthetic complications: no   No complications documented.  Last Vitals:  Vitals:   08/31/20 1517 08/31/20 1546  BP: (!) 176/90 (!) 178/88  Pulse: 68 65  Resp: (!) 21 16  Temp:    SpO2: 100% 100%    Last Pain:  Vitals:   08/31/20 1546  TempSrc:   PainSc: 0-No pain                 Pervis Hocking

## 2020-08-31 NOTE — Op Note (Signed)
[facility] Patient Name: Jeffrey Zuniga Procedure Date : 08/31/2020 MRN: [id number] Attending MD: Justice Britain , MD Date of Birth: 12-05-1955 CSN: [id number] Age: 65 Admit Type: Inpatient Procedure:                Upper GI endoscopy Indications:              Iron deficiency anemia Providers:                Justice Britain, MD, Clyde Lundborg, RN, Ladona Ridgel, Technician Referring MD:             [facility], Lajuan Lines. Pyrtle, MD Medicines:                Monitored Anesthesia Care Complications:            No immediate complications. Estimated Blood Loss:     Estimated blood loss was minimal. Procedure:                Pre-Anesthesia Assessment:                           - Prior to the procedure, a History and Physical                            was performed, and patient medications and                            allergies were reviewed. The patient's tolerance of                            previous anesthesia was also reviewed. The risks                            and benefits of the procedure and the sedation                            options and risks were discussed with the patient.                            All questions were answered, and informed consent                            was obtained. Prior Anticoagulants: The patient has                            taken no previous anticoagulant or antiplatelet                            agents. ASA Grade Assessment: III - A patient with                            severe systemic disease. After reviewing the risks  and benefits, the patient was deemed in                            satisfactory condition to undergo the procedure.                           After obtaining informed consent, the endoscope was                            passed under direct vision. Throughout the                            procedure, the patient's blood pressure, pulse, and                             oxygen saturations were monitored continuously. The                            [id number]) Olympus gastroscope was                            introduced through the mouth, and advanced to the                            second part of duodenum. The upper GI endoscopy was                            accomplished without difficulty. The patient                            tolerated the procedure. Scope In: Scope Out: Findings:      No gross lesions were noted in the entire esophagus.      The Z-line was irregular and was found 40 cm from the incisors.      Patchy mildly erythematous mucosa without bleeding was found in the       gastric body and in the gastric antrum.      No other gross lesions were noted in the entire examined stomach.       Biopsies were taken with a cold forceps for histology and Helicobacter       pylori testing.      No gross lesions were noted in the duodenal bulb, in the first portion       of the duodenum and in the second portion of the duodenum. Biopsies for       histology were taken with a cold forceps for evaluation of celiac       disease. Impression:               - No gross lesions in esophagus. Z-line irregular,                            40 cm from the incisors.                           - Erythematous mucosa in the gastric body and  antrum. No other gross lesions in the stomach.                            Biopsied.                           - No gross lesions in the duodenal bulb, in the                            first portion of the duodenum and in the second                            portion of the duodenum. Biopsied. Recommendation:           - Proceed to scheduled colonoscopy.                           - Observe patient's clinical course.                           - Await pathology results.                           - The findings and recommendations were discussed                             with the patient.                           - The findings and recommendations were discussed                            with the referring physician. Procedure Code(s):        --- Professional ---                           [phone], Esophagogastroduodenoscopy, flexible,                            transoral; with biopsy, single or multiple Diagnosis Code(s):        --- Professional ---                           K22.8, Other specified diseases of esophagus                           K31.89, Other diseases of stomach and duodenum                           D50.9, Iron deficiency anemia, unspecified CPT copyright 2019 American Medical Association. All rights reserved. The codes documented in this report are preliminary and upon coder review may  be revised to meet current compliance requirements. Justice Britain, MD 08/31/2020 3:00:53 PM Number of Addenda: 0

## 2020-08-31 NOTE — Transfer of Care (Signed)
Immediate Anesthesia Transfer of Care Note  Patient: Wyatte Dames  Procedure(s) Performed: ESOPHAGOGASTRODUODENOSCOPY (EGD) WITH PROPOFOL (N/A ) COLONOSCOPY WITH PROPOFOL (N/A ) BIOPSY POLYPECTOMY  Patient Location: Endoscopy Unit  Anesthesia Type:MAC  Level of Consciousness: drowsy, patient cooperative and responds to stimulation  Airway & Oxygen Therapy: Patient Spontanous Breathing and Patient connected to nasal cannula oxygen  Post-op Assessment: Report given to RN, Post -op Vital signs reviewed and stable and Patient moving all extremities  Post vital signs: Reviewed and stable  Last Vitals:  Vitals Value Taken Time  BP 130/66 08/31/20 1457  Temp    Pulse 70 08/31/20 1457  Resp 18 08/31/20 1457  SpO2 96 % 08/31/20 1457  Vitals shown include unvalidated device data.  Last Pain:  Vitals:   08/31/20 1308  TempSrc: Temporal  PainSc: 0-No pain         Complications: No complications documented.

## 2020-08-31 NOTE — Interval H&P Note (Signed)
History and Physical Interval Note:  08/31/2020 2:03 PM  Bedford Milles  has presented today for surgery, with the diagnosis of Anemia. Positive FOBT..  The various methods of treatment have been discussed with the patient and family. After consideration of risks, benefits and other options for treatment, the patient has consented to  Procedure(s): ESOPHAGOGASTRODUODENOSCOPY (EGD) WITH PROPOFOL (N/A) COLONOSCOPY WITH PROPOFOL (N/A) as a surgical intervention.  The patient's history has been reviewed, patient examined, no change in status, stable for surgery.  I have reviewed the patient's chart and labs.  Questions were answered to the patient's satisfaction.     [employer]

## 2020-09-01 ENCOUNTER — Inpatient Hospital Stay (HOSPITAL_COMMUNITY): Payer: 59

## 2020-09-01 DIAGNOSIS — I5043 Acute on chronic combined systolic (congestive) and diastolic (congestive) heart failure: Secondary | ICD-10-CM

## 2020-09-01 LAB — BASIC METABOLIC PANEL
Anion gap: 7 (ref 5–15)
BUN: 58 mg/dL — ABNORMAL HIGH (ref 8–23)
CO2: 16 mmol/L — ABNORMAL LOW (ref 22–32)
Calcium: 7.4 mg/dL — ABNORMAL LOW (ref 8.9–10.3)
Chloride: 113 mmol/L — ABNORMAL HIGH (ref 98–111)
Creatinine, Ser: 5.16 mg/dL — ABNORMAL HIGH (ref 0.61–1.24)
GFR, Estimated: 12 mL/min — ABNORMAL LOW (ref >60)
Glucose, Bld: 117 mg/dL — ABNORMAL HIGH (ref 70–99)
Potassium: 5 mmol/L (ref 3.5–5.1)
Sodium: 136 mmol/L (ref 135–145)

## 2020-09-01 LAB — ECHOCARDIOGRAM COMPLETE
AR max vel: 1.94 cm2
AV Area VTI: 1.81 cm2
AV Area mean vel: 1.91 cm2
AV Mean grad: 6 mmHg
AV Peak grad: 12.3 mmHg
Ao pk vel: 1.75 m/s
Area-P 1/2: 3.53 cm2
Height: 67 in
MV VTI: 1.67 cm2
S' Lateral: 3.9 cm
Weight: 2851.2 oz

## 2020-09-01 LAB — CBC
HCT: 26.6 % — ABNORMAL LOW (ref 39.0–52.0)
Hemoglobin: 8.6 g/dL — ABNORMAL LOW (ref 13.0–17.0)
MCH: 28.2 pg (ref 26.0–34.0)
MCHC: 32.3 g/dL (ref 30.0–36.0)
MCV: 87.2 fL (ref 80.0–100.0)
Platelets: 249 10*3/uL (ref 150–400)
RBC: 3.05 MIL/uL — ABNORMAL LOW (ref 4.22–5.81)
RDW: 16.8 % — ABNORMAL HIGH (ref 11.5–15.5)
WBC: 7.1 10*3/uL (ref 4.0–10.5)
nRBC: 0 % (ref 0.0–0.2)

## 2020-09-01 LAB — GLUCOSE, CAPILLARY: Glucose-Capillary: 85 mg/dL (ref 70–99)

## 2020-09-01 LAB — PHOSPHORUS: Phosphorus: 6.2 mg/dL — ABNORMAL HIGH (ref 2.5–4.6)

## 2020-09-01 LAB — MAGNESIUM: Magnesium: 1.6 mg/dL — ABNORMAL LOW (ref 1.7–2.4)

## 2020-09-01 MED ORDER — MAGNESIUM SULFATE 2 GM/50ML IV SOLN
2.0000 g | Freq: Once | INTRAVENOUS | Status: AC
  Administered 2020-09-01: 2 g via INTRAVENOUS
  Filled 2020-09-01: qty 50

## 2020-09-01 MED ORDER — ETHACRYNIC ACID 25 MG PO TABS
50.0000 mg | ORAL_TABLET | Freq: Once | ORAL | Status: AC
  Administered 2020-09-01: 50 mg via ORAL
  Filled 2020-09-01: qty 2

## 2020-09-01 MED ORDER — ISOSORBIDE MONONITRATE ER 30 MG PO TB24: 30.0000 mg | ORAL_TABLET | Freq: Every day | ORAL | 0 refills | Status: DC

## 2020-09-01 MED ORDER — ETHACRYNIC ACID 25 MG PO TABS: 25.0000 mg | ORAL_TABLET | Freq: Every day | ORAL | 0 refills | Status: DC

## 2020-09-01 MED ORDER — AMLODIPINE BESYLATE 10 MG PO TABS
10.0000 mg | ORAL_TABLET | Freq: Every day | ORAL | Status: DC
  Administered 2020-09-01: 10 mg via ORAL
  Filled 2020-09-01: qty 1

## 2020-09-01 MED ORDER — AMLODIPINE BESYLATE 10 MG PO TABS: 10.0000 mg | ORAL_TABLET | Freq: Every day | ORAL | 0 refills | Status: DC

## 2020-09-01 NOTE — Discharge Summary (Signed)
Name: Jeffrey Zuniga MRN: [id number] DOB: 03-May-1956 65 y.o. PCP: Patient, No Pcp Per  Date of Admission: 08/29/2020  7:39 AM Date of Discharge: 09/01/20 Attending Physician: Dr. Dareen Piano  Discharge Diagnosis: Active Problems:   Hypertensive crisis    Discharge Medications: Allergies as of 09/01/2020   No Known Allergies     Medication List    STOP taking these medications   furosemide 20 MG tablet Commonly known as: Lasix   Sutab [phone] MG Tabs Generic drug: Sodium Sulfate-Mag Sulfate-KCl     TAKE these medications   amLODipine 10 MG tablet Commonly known as: NORVASC Take 1 tablet (10 mg total) by mouth daily. Start taking on: September 02, 2020 What changed:   medication strength  how much to take   carvedilol 6.25 MG tablet Commonly known as: COREG Take 1 tablet (6.25 mg total) by mouth 2 (two) times daily with a meal.   ethacrynic acid 25 MG tablet Commonly known as: EDECRIN Take 1 tablet (25 mg total) by mouth daily.   ferrous sulfate 325 (65 FE) MG tablet Take 1 tablet (325 mg total) by mouth 2 (two) times daily with a meal.   isosorbide mononitrate 30 MG 24 hr tablet Commonly known as: IMDUR Take 1 tablet (30 mg total) by mouth daily. Start taking on: September 02, 2020   latanoprost 0.005 % ophthalmic solution Commonly known as: XALATAN Place 1 drop into both eyes at bedtime.       Disposition and follow-up:   Jeffrey Zuniga was discharged from [facility] in Stable condition.  At the hospital follow up visit please address:  1.  Follow-up:  a.  Hypertension-follow-up with primary care provider to adjust medication    b.  Acute on chronic kidney disease-follow-up with PCP/nephrology   c.  Suspected GI bleed-follow-up with GI outpatient   d.  Diffuse rash-follow-up with PCP  2.  Labs / imaging needed at time of follow-up: CBC, BMP  3.  Pending labs/ test needing follow-up: SPEP, free light chains  4.  Medication  Changes  Started: Ethacrynic acid, Imdur  Stopped: Lasix  Changed: Amlodipine increased to 10 mg  Abx -none  Follow-up Appointments:  Follow-up Information    [place]. Go on 09/15/2020.   Why: at 10:30am with Juluis Mire, NP for hospital follow-up Contact information: [place] [phone]       [place], [place]. Schedule an appointment as soon as possible for a visit.   Contact information: [place] [address] [phone]               Hospital Course by problem list:  Severe symptomatic hypertension Patient presented to the emergency department with blood pressure of 215/101.  Patient states that for the past 2 weeks he has not had any of his medications.  He has a new health insurance and his old primary care provider was no longer covered under this, so when his medications ran out he had no one to refill them.  As such as pressures continued to increase over the past 2 weeks.  They improved well with amlodipine, Coreg, Lasix.  He did have to be started on a nitro drip periodically.  Suspect that his chronic heart failure exacerbation and chronic kidney disease were exacerbated by these increase pressures.  Discussed with the patient that this blood pressures are not well controlled, this will lead to worsening heart failure as well as  kidney failure and possibly dialysis.  Patient knowledges understanding.  He was discharged home with Imdur, amlodipine, carvedilol, ethacrynic acid.  Acute on chronic HFrEF Patient with EF of 45 to 50% in 10/21.  On admission patient was hypervolemic.  His weight was 74 kg upon admission, his discharge weight was 67 kg.  Chest x-ray with bilateral pulmonary congestion BNP of 3400.  Patient with bibasilar crackles and lower extremity swelling.  He also required oxygen supplementation.  He was given Lasix and had improvement of his symptoms  as well as volume status.  Is thought that the Lasix was contributing to his rash, so he was switched to a nonsulfa diuretic.  He was continued on ethacrynic acid 25 mg daily.  He was instructed to follow-up with his primary care provider.  Repeat echocardiogram prior to discharge showed EF of 60 to 65%.  GI bleed Patient endorsed melena, generalized fatigue, and worsening shortness of breath.  Hemoglobin was found to be 6.4 and his FOBT was positive.  He received 1 units of PRBC's.  He was started on a Protonix drip made n.p.o. and GI was consulted.  They performed a endoscopy as well as colonoscopy.  They did not find the active source of bleeding's.  GI recommended following up on outpatient basis for pill endoscopy.  Will have patient follow-up with primary care provider.  Hemoglobin improved he had no other signs or symptoms of acute bleed.  Acute on chronic kidney failure Baseline creatinine of 3.9.  Admission creatinine of 5.01 potassium of 5.4 and bicarb of 16.  He had large amounts of proteinuria on his urinalysis.  Slight to be secondary to medication nonadherence in setting of hypertensive emergency.  It no issues with urination and denied decrease in amount of urine.  He was given Lasix and his creatinine improved.  Certain if this is patient's new baseline at this time, want to see if he improves with home diuretics and well-controlled blood pressure.  In the setting of his proteinuria, SPEP and free light chains were tested.  Patient having no other signs or symptoms of proteinemia  Diffuse rash Patient presented with diffuse rash over legs arms abdomen and back that onset after his first admission 3 months ago.  We E consulted dermatology who thought this was a eczematous rash.  This was thought to be possibly due to medication allergy, specifically furosemide.  Frusemide was stopped and he switch to an alternative medication.  Rash progressed well and seem to be improving at discharge.  He  will need to follow-up with primary care provider for further work-up.  May need possible punch biopsy in the future.  Discharge Subjective: Patient doing well, states he feels much better.  States he has been urinating without difficulty.  Discussed with him that we perform an echocardiogram today and if normal would be discharging him home.  Discussed with him the importance of following up with primary care provider as with his hypertension needs to be well controlled to not further worsen his heart failure or kidney failure.  Patient knowledges understanding of this  Discharge Exam:   BP (!) 167/91 (BP Location: Left Arm)   Pulse 72   Temp 97.8 F (36.6 C) (Oral)   Resp 17   Ht '5\' 7"'$  (1.702 m)   Wt 80.8 kg   SpO2 100%   BMI 27.91 kg/m  Constitutional: well-appearing, in no acute distress HENT: normocephalic atraumatic Eyes: conjunctiva non-erythematous Neck: supple Cardiovascular: regular rate and rhythm, systolic murmur  2/6 best heard over left fifth intercostal space. Pulmonary/Chest: No acute respiratory distress, normal work of breathing on room air.  Clear to auscultation Abdominal: soft, non-tender, non-distended MSK: normal bulk and tone Neurological: alert & oriented x 3 Skin: warm and dry, trace lower extremity edema.  Scaly plaques on bilateral lower as well as upper extremities.  Small plaques on back. Psych: Normal mood  Pertinent Labs, Studies, and Procedures:  CBC Latest Ref Rng & Units 09/01/2020 08/31/2020 08/30/2020  WBC 4.0 - 10.5 K/uL 7.1 6.7 6.3  Hemoglobin 13.0 - 17.0 g/dL 8.6(L) 8.4(L) 7.9(L)  Hematocrit 39.0 - 52.0 % 26.6(L) 26.4(L) 24.6(L)  Platelets 150 - 400 K/uL 249 219 198    CMP Latest Ref Rng & Units 09/01/2020 08/31/2020 08/30/2020  Glucose 70 - 99 mg/dL 117(H) 90 80  BUN 8 - 23 mg/dL 58(H) 58(H) 62(H)  Creatinine 0.61 - 1.24 mg/dL 5.16(H) 4.87(H) 4.84(H)  Sodium 135 - 145 mmol/L 136 135 138  Potassium 3.5 - 5.1 mmol/L 5.0 4.9 4.7  Chloride 98 -  111 mmol/L 113(H) 111 112(H)  CO2 22 - 32 mmol/L 16(L) 15(L) 17(L)  Calcium 8.9 - 10.3 mg/dL 7.4(L) 7.7(L) 7.7(L)  Total Protein 6.5 - 8.1 g/dL - - 6.3(L)  Total Bilirubin 0.3 - 1.2 mg/dL - - 1.2  Alkaline Phos 38 - 126 U/L - - 70  AST 15 - 41 U/L - - 31  ALT 0 - 44 U/L - - 28    DG Chest Port 1 View  Result Date: 08/29/2020 CLINICAL DATA:  Shortness of breath EXAM: PORTABLE CHEST 1 VIEW COMPARISON:  04/02/2020 FINDINGS: Cardiomegaly. Cardiac silhouette appears slightly increased in size from prior, which could be accentuated by technique. There is pulmonary vascular congestion and mild diffuse interstitial prominence. No focal airspace consolidation. No pleural effusion or pneumothorax. IMPRESSION: 1. Cardiomegaly with pulmonary vascular congestion and mild diffuse interstitial prominence, suggesting CHF with mild interstitial edema. 2. Cardiac silhouette appears slightly more prominent compared to prior study. Underlying pericardial effusion not excluded. Electronically Signed   By: Davina Poke D.O.   On: 08/29/2020 08:27     Discharge Instructions: Discharge Instructions    Call MD for:  difficulty breathing, headache or visual disturbances   Complete by: As directed    Call MD for:  extreme fatigue   Complete by: As directed    Call MD for:  hives   Complete by: As directed    Call MD for:  persistant dizziness or light-headedness   Complete by: As directed    Call MD for:  persistant nausea and vomiting   Complete by: As directed    Call MD for:  redness, tenderness, or signs of infection (pain, swelling, redness, odor or green/yellow discharge around incision site)   Complete by: As directed    Call MD for:  severe uncontrolled pain   Complete by: As directed    Call MD for:  temperature >100.4   Complete by: As directed    Diet - low sodium heart healthy   Complete by: As directed    Discharge instructions   Complete by: As directed    Mr. Evert thank you for allowing  [place] to care for you during your hospitalization.  We believe that your acute heart failure exacerbation and worsening kidney function were all due to your high blood pressure.  Will be of other importance that you follow-up with your primary care provider that was given to you during your admission to discuss  controlling her blood pressure.  We believe that this is key in making sure that your heart failure and her kidney function does not worsen.  Will be important to optimize her medications on outpatient basis.  Because of this we recommend you follow-up with the kidney doctors, nephrologist.  This may need to be a referral from your primary care provider.  I have written you for 30-day supply of medications and should hold you over until your appointment with your PCP.  We have started you on Imdur heart failure medicine and ethacrynic acid, fluid pill.  If you have any questions about any of these please call the hospital and asked to speak with the internal medicine teaching service or follow-up with your primary care provider.   Increase activity slowly   Complete by: As directed       Signed: Riesa Pope, MD 09/01/2020, 6:25 PM   Pager: [phone]

## 2020-09-01 NOTE — Plan of Care (Signed)
  Problem: Education: Goal: Ability to identify signs and symptoms of gastrointestinal bleeding will improve Outcome: Progressing   Problem: Bowel/Gastric: Goal: Will show no signs and symptoms of gastrointestinal bleeding Outcome: Progressing   Problem: Fluid Volume: Goal: Will show no signs and symptoms of excessive bleeding Outcome: Progressing   Problem: Clinical Measurements: Goal: Complications related to the disease process, condition or treatment will be avoided or minimized Outcome: Progressing   Problem: Education: Goal: Knowledge of General Education information will improve Description: Including pain rating scale, medication(s)/side effects and non-pharmacologic comfort measures Outcome: Progressing   Problem: Health Behavior/Discharge Planning: Goal: Ability to manage health-related needs will improve Outcome: Progressing   Problem: Clinical Measurements: Goal: Ability to maintain clinical measurements within normal limits will improve Outcome: Progressing Goal: Will remain free from infection Outcome: Progressing Goal: Diagnostic test results will improve Outcome: Progressing Goal: Respiratory complications will improve Outcome: Progressing Goal: Cardiovascular complication will be avoided Outcome: Progressing   Problem: Activity: Goal: Risk for activity intolerance will decrease Outcome: Progressing   Problem: Nutrition: Goal: Adequate nutrition will be maintained Outcome: Progressing   Problem: Elimination: Goal: Will not experience complications related to bowel motility Outcome: Progressing Goal: Will not experience complications related to urinary retention Outcome: Progressing   Problem: Pain Managment: Goal: General experience of comfort will improve Outcome: Progressing   Problem: Safety: Goal: Ability to remain free from injury will improve Outcome: Progressing   Problem: Skin Integrity: Goal: Risk for impaired skin integrity will  decrease Outcome: Progressing

## 2020-09-01 NOTE — Progress Notes (Signed)
  Echocardiogram 2D Echocardiogram has been performed.  Jeffrey Zuniga 09/01/2020, 9:48 AM

## 2020-09-02 LAB — KAPPA/LAMBDA LIGHT CHAINS
Kappa free light chain: 341.6 mg/L — ABNORMAL HIGH (ref 3.3–19.4)
Kappa, lambda light chain ratio: 2.11 — ABNORMAL HIGH (ref 0.26–1.65)
Lambda free light chains: 162 mg/L — ABNORMAL HIGH (ref 5.7–26.3)

## 2020-09-03 ENCOUNTER — Encounter (HOSPITAL_COMMUNITY): Payer: Self-pay | Admitting: Gastroenterology

## 2020-09-05 LAB — SURGICAL PATHOLOGY

## 2020-09-06 ENCOUNTER — Encounter: Payer: Self-pay | Admitting: Gastroenterology

## 2020-09-06 LAB — MULTIPLE MYELOMA PANEL, SERUM
Albumin SerPl Elph-Mcnc: 3 g/dL (ref 2.9–4.4)
Albumin/Glob SerPl: 0.8 (ref 0.7–1.7)
Alpha 1: 0.3 g/dL (ref 0.0–0.4)
Alpha2 Glob SerPl Elph-Mcnc: 0.9 g/dL (ref 0.4–1.0)
B-Globulin SerPl Elph-Mcnc: 0.9 g/dL (ref 0.7–1.3)
Gamma Glob SerPl Elph-Mcnc: 2 g/dL — ABNORMAL HIGH (ref 0.4–1.8)
Globulin, Total: 4.1 g/dL — ABNORMAL HIGH (ref 2.2–3.9)
IgA: 249 mg/dL (ref 61–437)
IgG (Immunoglobin G), Serum: 2006 mg/dL — ABNORMAL HIGH (ref 603–1613)
IgM (Immunoglobulin M), Srm: 102 mg/dL (ref 20–172)
Total Protein ELP: 7.1 g/dL (ref 6.0–8.5)

## 2020-09-08 ENCOUNTER — Telehealth: Payer: Self-pay | Admitting: *Deleted

## 2020-09-08 ENCOUNTER — Other Ambulatory Visit: Payer: Self-pay

## 2020-09-08 ENCOUNTER — Encounter: Payer: Self-pay | Admitting: *Deleted

## 2020-09-08 DIAGNOSIS — A048 Other specified bacterial intestinal infections: Secondary | ICD-10-CM

## 2020-09-08 MED ORDER — TALICIA 250-12.5-10 MG PO CPDR: 4.0000 | DELAYED_RELEASE_CAPSULE | Freq: Three times a day (TID) | ORAL | 0 refills | Status: DC

## 2020-09-08 MED ORDER — PYLERA 140-125-125 MG PO CAPS: 3.0000 | ORAL_CAPSULE | Freq: Three times a day (TID) | ORAL | 0 refills | Status: DC

## 2020-09-08 MED ORDER — OMEPRAZOLE 20 MG PO CPDR: 20.0000 mg | DELAYED_RELEASE_CAPSULE | Freq: Two times a day (BID) | ORAL | 0 refills | Status: DC

## 2020-09-08 NOTE — Telephone Encounter (Signed)
Yes, as per my original result note, okay for [place]

## 2020-09-08 NOTE — Telephone Encounter (Signed)
Insurance prefers patient take Pat Kocher for H Pylori rather than Pylera. May I switch Rx?

## 2020-09-09 MED ORDER — TALICIA 250-12.5-10 MG PO CPDR: 4.0000 | DELAYED_RELEASE_CAPSULE | Freq: Three times a day (TID) | ORAL | 0 refills | Status: DC

## 2020-09-09 NOTE — Telephone Encounter (Signed)
Jeffrey Zuniga script sent to [facility]. Office notes sent as well in preparation for prior authorization.

## 2020-09-15 ENCOUNTER — Inpatient Hospital Stay (INDEPENDENT_AMBULATORY_CARE_PROVIDER_SITE_OTHER): Payer: Self-pay | Admitting: Primary Care

## 2020-09-16 NOTE — Telephone Encounter (Signed)
PA sent via cover my meds.

## 2020-09-22 ENCOUNTER — Encounter: Payer: Self-pay | Admitting: *Deleted

## 2020-10-04 ENCOUNTER — Ambulatory Visit: Payer: Self-pay | Admitting: Internal Medicine

## 2020-10-05 ENCOUNTER — Telehealth (INDEPENDENT_AMBULATORY_CARE_PROVIDER_SITE_OTHER): Payer: Self-pay

## 2020-10-05 NOTE — Telephone Encounter (Signed)
Copied from [place] [phone]. Topic: General - Other >> Oct 03, 2020  3:12 PM Oneta Rack wrote: Jeani Hawking from [facility] phone # [phone] called to inform PCP patient no show 2x. Specialist unable to reach patient and will mail a letter to patient. Specialist states patient has 1x to schedule another appointment.  FYI

## 2020-10-19 NOTE — Telephone Encounter (Signed)
Ok for pylera or its components with BID PPI Does not need ov H pylori stool 8 weeks or so after therapy to confirm eradication

## 2020-10-19 NOTE — Telephone Encounter (Signed)
We have just gotten a faxed note from [facility] as of 10/19/20 indicating that patient declines Talicia due to high cost. Patient no showed for 10/04/20 office visit with Dr Hilarie Fredrickson. Dr Hilarie Fredrickson, please advise... may I give pylera breakdown? Do you want him to come for office visit first to discuss repeat EGD?

## 2020-10-20 NOTE — Telephone Encounter (Signed)
I have left a voicemail for patient to call back. 

## 2020-10-21 MED ORDER — METRONIDAZOLE 250 MG PO TABS: 250.0000 mg | ORAL_TABLET | Freq: Two times a day (BID) | ORAL | 0 refills | Status: DC

## 2020-10-21 MED ORDER — DOXYCYCLINE HYCLATE 100 MG PO TABS: 100.0000 mg | ORAL_TABLET | Freq: Two times a day (BID) | ORAL | 0 refills | Status: DC

## 2020-10-21 MED ORDER — BISMUTH SUBSALICYLATE 262 MG PO CHEW: 524.0000 mg | CHEWABLE_TABLET | Freq: Four times a day (QID) | ORAL | 0 refills | Status: AC

## 2020-10-21 MED ORDER — OMEPRAZOLE 40 MG PO CPDR: 40.0000 mg | DELAYED_RELEASE_CAPSULE | Freq: Two times a day (BID) | ORAL | 0 refills | Status: DC

## 2020-10-21 NOTE — Telephone Encounter (Signed)
I have spoken to patient to advise that we will send a breakdown of Pylera since Pat Kocher is not cost effective and Pylera likely is just as expensive.   Breakdown is as follows (all x 14 days, then discontinue):  Breakfast:  *Bismuth subsalicylate chewable tablet (Pepto Bismol) 262 mg--    2 tablets  *Doxycycline 100 mg-1 tablet  *Metronidazole (Flagyl) 250 mg tablet-1 tablet  *Omeprazole (Prilosec) 40 mg-1 tablet  Lunch:  *Bismuth subsalicylate chewable tablet (Pepto Bismol) 262 mg--    2 tablets   Dinner: *Bismuth subsalicylate chewable tablet (Pepto Bismol) 262 mg--    2 tablets  *Doxycycline 100 mg- 1 tablet  *Metronidazole (Flagyl) 250 mg tablet-1 tablet  *Omeprazole (Prilosec) 40 mg-1 tablet  Bedtime: *Bismuth subsalicylate chewable tablet (Pepto Bismol) 262 mg--   2 tablets  YOU WILL NEED A TOTAL OF: Bismuth Subsalicylate (Pepto Bismol)- 112 tablets Doxycycline 100 mg- 28 tablets Metronidazole 250 mg- 28 tablets Omeprazole 40 mg- 28 tablets  I have explained this breakdown in detail to patient and have also explained that he should avoid all alcohol while taking these meds and should know that pepto bismol can cause dark/black stools. In addition, patient is advised that he should come for H Pylori stool testing 8 weeks after completion of his treatment. He verbalizes clear understanding of this and denies any questions.

## 2020-10-21 NOTE — Addendum Note (Signed)
Addended by: Larina Bras on: 10/21/2020 02:33 PM   Modules accepted: Orders

## 2020-10-28 ENCOUNTER — Other Ambulatory Visit: Payer: Self-pay

## 2020-10-28 ENCOUNTER — Encounter (HOSPITAL_COMMUNITY): Payer: Self-pay | Admitting: Infectious Diseases

## 2020-10-28 ENCOUNTER — Emergency Department (HOSPITAL_COMMUNITY): Payer: 59

## 2020-10-28 ENCOUNTER — Inpatient Hospital Stay (HOSPITAL_COMMUNITY)
Admission: EM | Admit: 2020-10-28 | Discharge: 2020-10-30 | DRG: 291 | Disposition: A | Discharge status: Home / Self Care | Payer: 59 | Attending: Infectious Diseases | Admitting: Infectious Diseases

## 2020-10-28 DIAGNOSIS — N185 Chronic kidney disease, stage 5: Secondary | ICD-10-CM | POA: Diagnosis present

## 2020-10-28 DIAGNOSIS — N433 Hydrocele, unspecified: Secondary | ICD-10-CM | POA: Diagnosis present

## 2020-10-28 DIAGNOSIS — Z79899 Other long term (current) drug therapy: Secondary | ICD-10-CM

## 2020-10-28 DIAGNOSIS — N179 Acute kidney failure, unspecified: Secondary | ICD-10-CM

## 2020-10-28 DIAGNOSIS — E1122 Type 2 diabetes mellitus with diabetic chronic kidney disease: Secondary | ICD-10-CM | POA: Diagnosis present

## 2020-10-28 DIAGNOSIS — E8770 Fluid overload, unspecified: Secondary | ICD-10-CM

## 2020-10-28 DIAGNOSIS — K2970 Gastritis, unspecified, without bleeding: Secondary | ICD-10-CM | POA: Diagnosis present

## 2020-10-28 DIAGNOSIS — M898X9 Other specified disorders of bone, unspecified site: Secondary | ICD-10-CM | POA: Diagnosis present

## 2020-10-28 DIAGNOSIS — B9681 Helicobacter pylori [H. pylori] as the cause of diseases classified elsewhere: Secondary | ICD-10-CM | POA: Diagnosis present

## 2020-10-28 DIAGNOSIS — Z8249 Family history of ischemic heart disease and other diseases of the circulatory system: Secondary | ICD-10-CM

## 2020-10-28 DIAGNOSIS — I509 Heart failure, unspecified: Secondary | ICD-10-CM

## 2020-10-28 DIAGNOSIS — I5041 Acute combined systolic (congestive) and diastolic (congestive) heart failure: Secondary | ICD-10-CM | POA: Diagnosis present

## 2020-10-28 DIAGNOSIS — I132 Hypertensive heart and chronic kidney disease with heart failure and with stage 5 chronic kidney disease, or end stage renal disease: Secondary | ICD-10-CM | POA: Diagnosis not present

## 2020-10-28 DIAGNOSIS — E872 Acidosis: Secondary | ICD-10-CM | POA: Diagnosis present

## 2020-10-28 DIAGNOSIS — D631 Anemia in chronic kidney disease: Secondary | ICD-10-CM | POA: Diagnosis present

## 2020-10-28 DIAGNOSIS — I5043 Acute on chronic combined systolic (congestive) and diastolic (congestive) heart failure: Secondary | ICD-10-CM | POA: Diagnosis present

## 2020-10-28 DIAGNOSIS — D509 Iron deficiency anemia, unspecified: Secondary | ICD-10-CM

## 2020-10-28 DIAGNOSIS — F17210 Nicotine dependence, cigarettes, uncomplicated: Secondary | ICD-10-CM | POA: Diagnosis present

## 2020-10-28 DIAGNOSIS — I5031 Acute diastolic (congestive) heart failure: Secondary | ICD-10-CM | POA: Diagnosis not present

## 2020-10-28 DIAGNOSIS — I160 Hypertensive urgency: Secondary | ICD-10-CM | POA: Diagnosis not present

## 2020-10-28 DIAGNOSIS — R7303 Prediabetes: Secondary | ICD-10-CM

## 2020-10-28 DIAGNOSIS — Z20822 Contact with and (suspected) exposure to covid-19: Secondary | ICD-10-CM | POA: Diagnosis present

## 2020-10-28 DIAGNOSIS — N049 Nephrotic syndrome with unspecified morphologic changes: Secondary | ICD-10-CM | POA: Diagnosis present

## 2020-10-28 DIAGNOSIS — Z23 Encounter for immunization: Secondary | ICD-10-CM

## 2020-10-28 DIAGNOSIS — D638 Anemia in other chronic diseases classified elsewhere: Secondary | ICD-10-CM | POA: Diagnosis not present

## 2020-10-28 DIAGNOSIS — I169 Hypertensive crisis, unspecified: Secondary | ICD-10-CM | POA: Diagnosis present

## 2020-10-28 DIAGNOSIS — I10 Essential (primary) hypertension: Secondary | ICD-10-CM | POA: Diagnosis not present

## 2020-10-28 DIAGNOSIS — N189 Chronic kidney disease, unspecified: Secondary | ICD-10-CM

## 2020-10-28 HISTORY — DX: Heart failure, unspecified: I50.9

## 2020-10-28 LAB — CBC WITH DIFFERENTIAL/PLATELET
Abs Immature Granulocytes: 0.03 10*3/uL (ref 0.00–0.07)
Basophils Absolute: 0 10*3/uL (ref 0.0–0.1)
Basophils Relative: 0 %
Eosinophils Absolute: 0.2 10*3/uL (ref 0.0–0.5)
Eosinophils Relative: 3 %
HCT: 23.8 % — ABNORMAL LOW (ref 39.0–52.0)
Hemoglobin: 7.1 g/dL — ABNORMAL LOW (ref 13.0–17.0)
Immature Granulocytes: 1 %
Lymphocytes Relative: 11 %
Lymphs Abs: 0.8 10*3/uL (ref 0.7–4.0)
MCH: 26.3 pg (ref 26.0–34.0)
MCHC: 29.8 g/dL — ABNORMAL LOW (ref 30.0–36.0)
MCV: 88.1 fL (ref 80.0–100.0)
Monocytes Absolute: 0.7 10*3/uL (ref 0.1–1.0)
Monocytes Relative: 10 %
Neutro Abs: 5 10*3/uL (ref 1.7–7.7)
Neutrophils Relative %: 75 %
Platelets: 208 10*3/uL (ref 150–400)
RBC: 2.7 MIL/uL — ABNORMAL LOW (ref 4.22–5.81)
RDW: 17.2 % — ABNORMAL HIGH (ref 11.5–15.5)
WBC: 6.6 10*3/uL (ref 4.0–10.5)
nRBC: 0 % (ref 0.0–0.2)

## 2020-10-28 LAB — HEPATIC FUNCTION PANEL
ALT: 21 U/L (ref 0–44)
AST: 30 U/L (ref 15–41)
Albumin: 2.4 g/dL — ABNORMAL LOW (ref 3.5–5.0)
Alkaline Phosphatase: 84 U/L (ref 38–126)
Bilirubin, Direct: <0.1 mg/dL (ref 0.0–0.2)
Total Bilirubin: 0.4 mg/dL (ref 0.3–1.2)
Total Protein: 6.9 g/dL (ref 6.5–8.1)

## 2020-10-28 LAB — TROPONIN I (HIGH SENSITIVITY)
Troponin I (High Sensitivity): 51 ng/L — ABNORMAL HIGH (ref <18)
Troponin I (High Sensitivity): 44 ng/L — ABNORMAL HIGH (ref <18)

## 2020-10-28 LAB — URINALYSIS, ROUTINE W REFLEX MICROSCOPIC
Bilirubin Urine: NEGATIVE
Glucose, UA: 50 mg/dL — AB
Ketones, ur: NEGATIVE mg/dL
Leukocytes,Ua: NEGATIVE
Nitrite: NEGATIVE
Protein, ur: >=300 mg/dL — AB
Specific Gravity, Urine: 1.011 (ref 1.005–1.030)
pH: 6 (ref 5.0–8.0)

## 2020-10-28 LAB — CREATININE, URINE, RANDOM: Creatinine, Urine: 83.24 mg/dL

## 2020-10-28 LAB — PROTEIN / CREATININE RATIO, URINE
Creatinine, Urine: 81.91 mg/dL
Protein Creatinine Ratio: 7.11 mg/mg{Cre} — ABNORMAL HIGH (ref 0.00–0.15)
Total Protein, Urine: 582 mg/dL

## 2020-10-28 LAB — BASIC METABOLIC PANEL
Anion gap: 8 (ref 5–15)
BUN: 45 mg/dL — ABNORMAL HIGH (ref 8–23)
CO2: 18 mmol/L — ABNORMAL LOW (ref 22–32)
Calcium: 7.7 mg/dL — ABNORMAL LOW (ref 8.9–10.3)
Chloride: 109 mmol/L (ref 98–111)
Creatinine, Ser: 5.15 mg/dL — ABNORMAL HIGH (ref 0.61–1.24)
GFR, Estimated: 12 mL/min — ABNORMAL LOW (ref >60)
Glucose, Bld: 106 mg/dL — ABNORMAL HIGH (ref 70–99)
Potassium: 3.8 mmol/L (ref 3.5–5.1)
Sodium: 135 mmol/L (ref 135–145)

## 2020-10-28 LAB — SODIUM, URINE, RANDOM: Sodium, Ur: 73 mmol/L

## 2020-10-28 LAB — HEMOGLOBIN A1C
Hgb A1c MFr Bld: 5.3 % (ref 4.8–5.6)
Mean Plasma Glucose: 105.41 mg/dL

## 2020-10-28 LAB — MAGNESIUM: Magnesium: 1.8 mg/dL (ref 1.7–2.4)

## 2020-10-28 LAB — BRAIN NATRIURETIC PEPTIDE: B Natriuretic Peptide: >4500 pg/mL — ABNORMAL HIGH (ref 0.0–100.0)

## 2020-10-28 IMAGING — US US SCROTUM W/ DOPPLER COMPLETE
1 series · 13 of 25 positions shown · non-contrast
Comparison: None.

CLINICAL DATA: Severe testicular pain.

EXAM:
SCROTAL ULTRASOUND
DOPPLER ULTRASOUND OF THE TESTICLES
TECHNIQUE: Complete ultrasound examination of the testicles, epididymis, and
other scrotal structures was performed. Color and spectral Doppler
ultrasound were also utilized to evaluate blood flow to the
testicles.

[Series 1: us scrotum w/doppler · 13 of 70 slices shown]
[im 1/70]
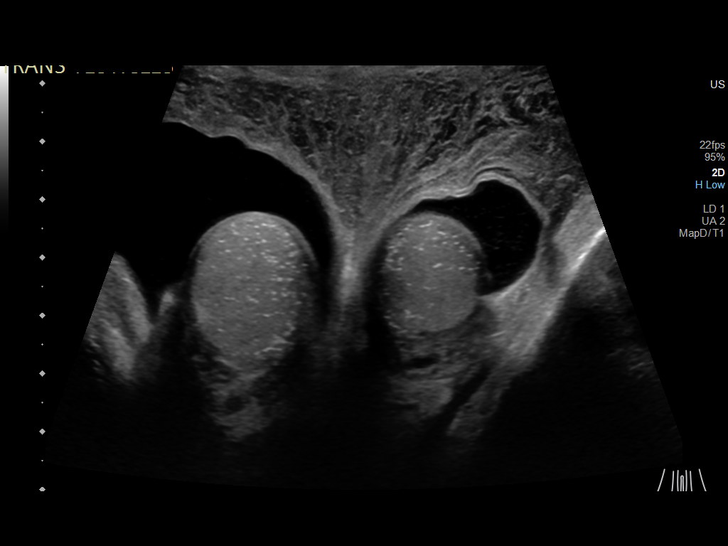
[im 6/70]
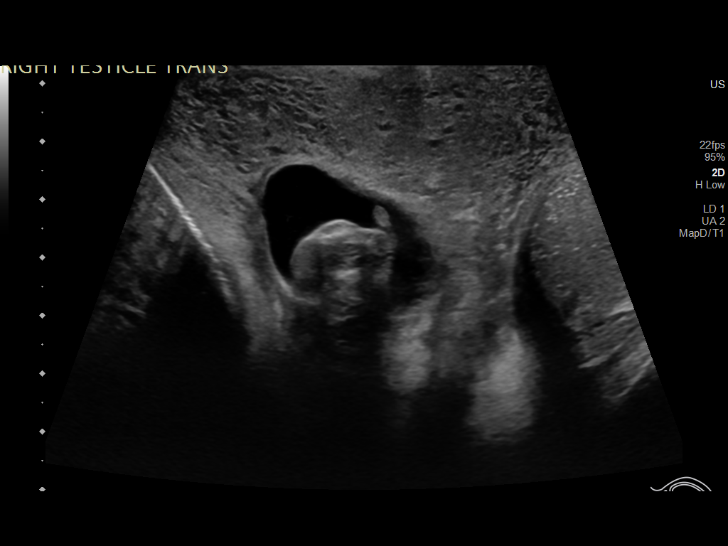
[im 12/70]
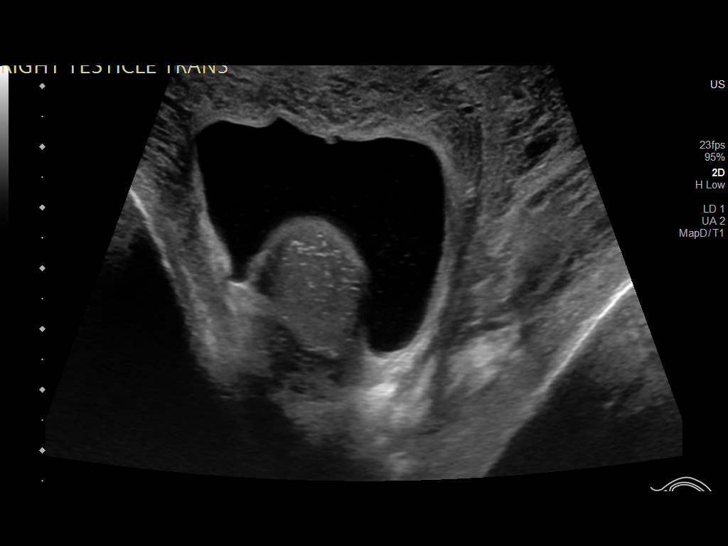
[im 18/70]
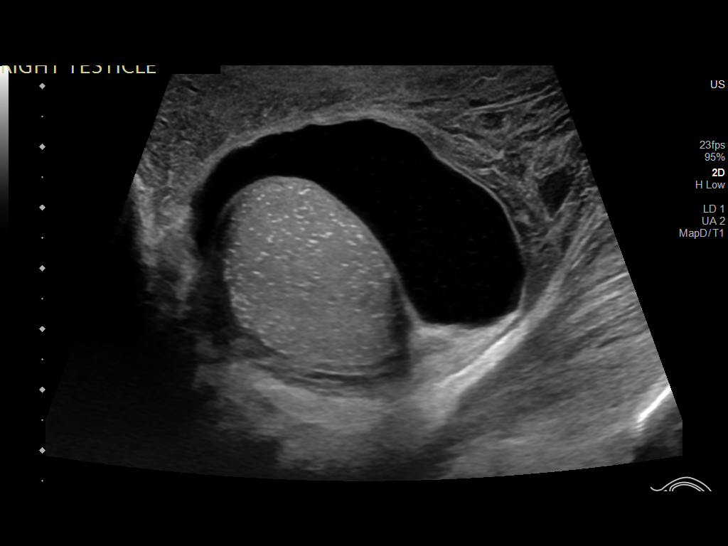
[im 24/70]
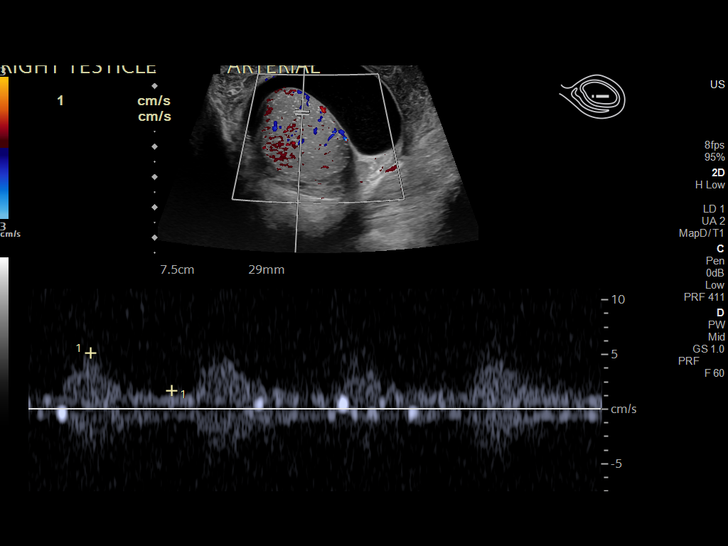
[im 29/70]
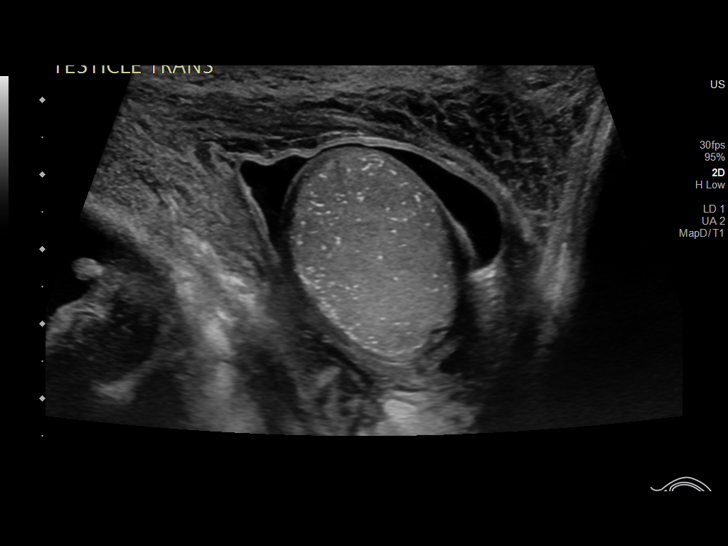
[im 35/70]
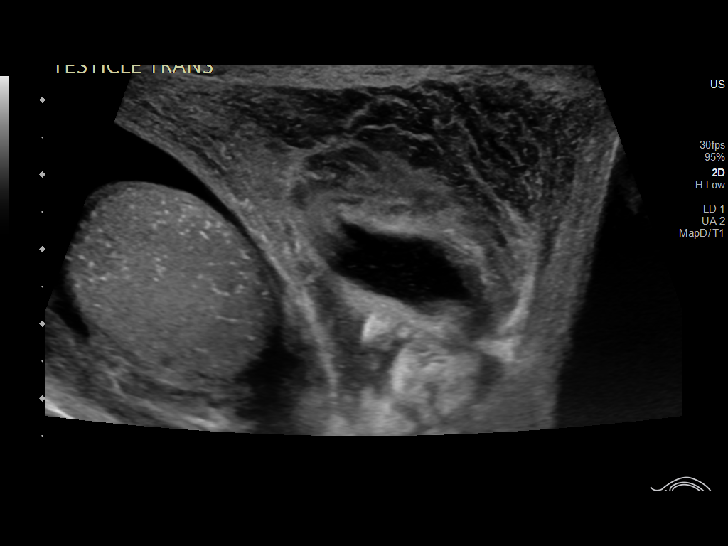
[im 41/70]
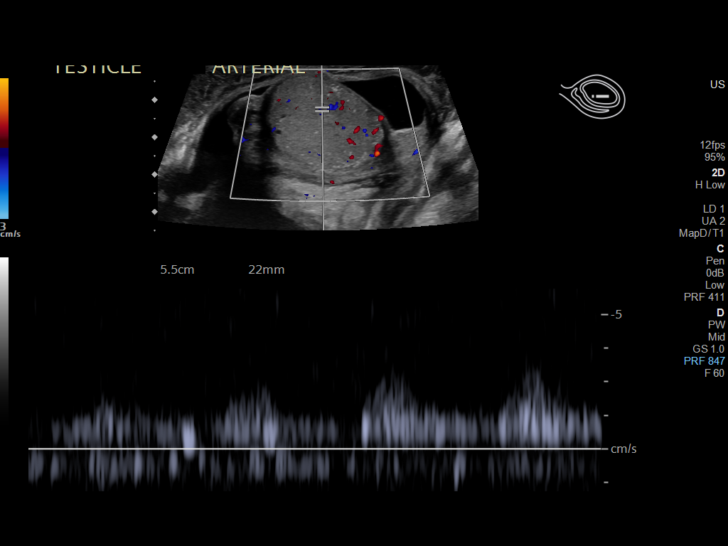
[im 47/70]
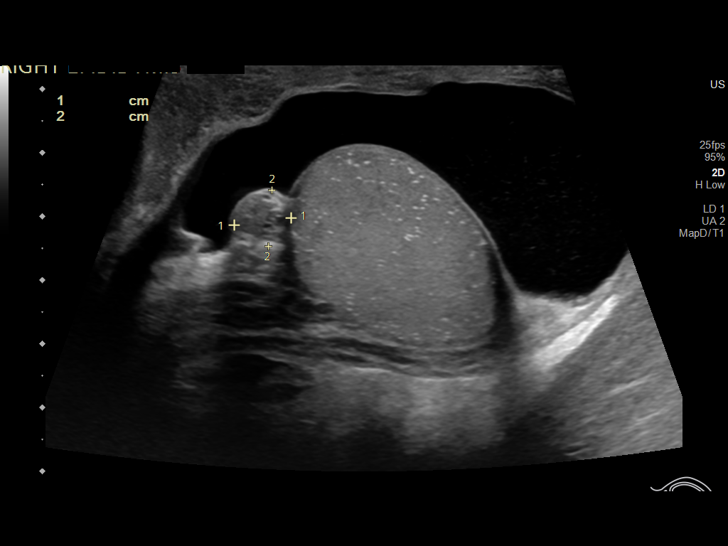
[im 52/70]
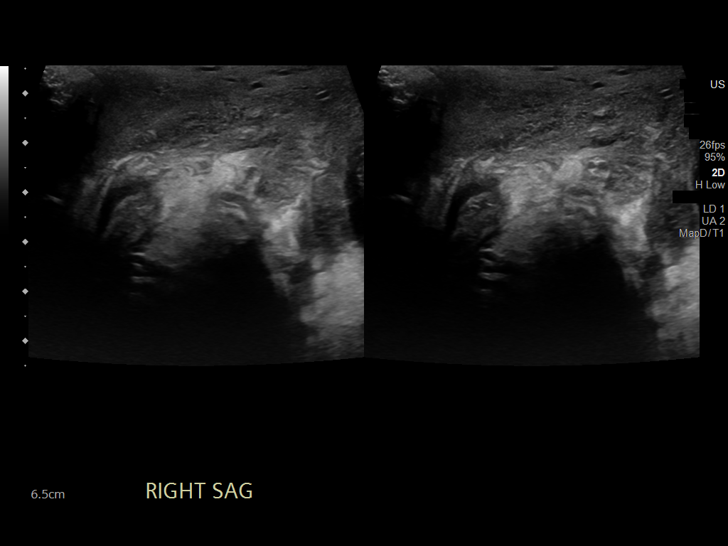
[im 58/70]
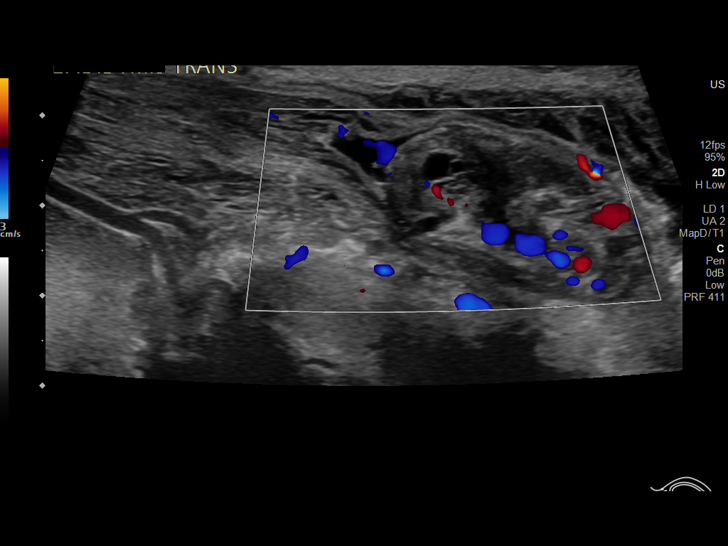
[im 64/70]
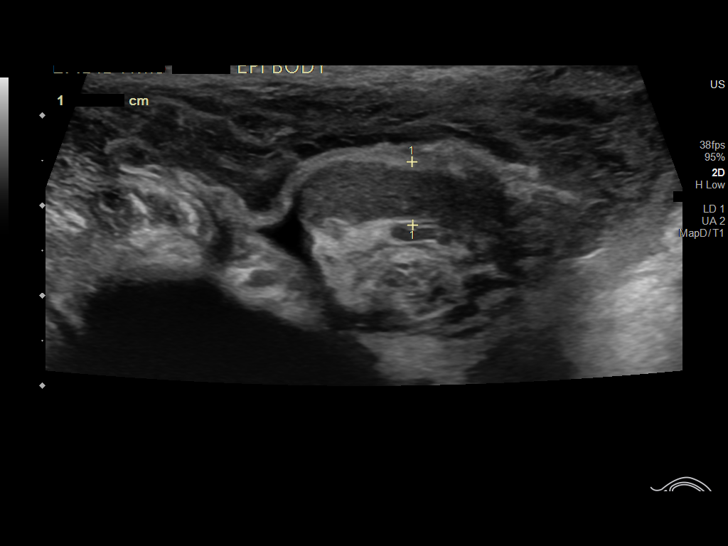
[im 70/70]
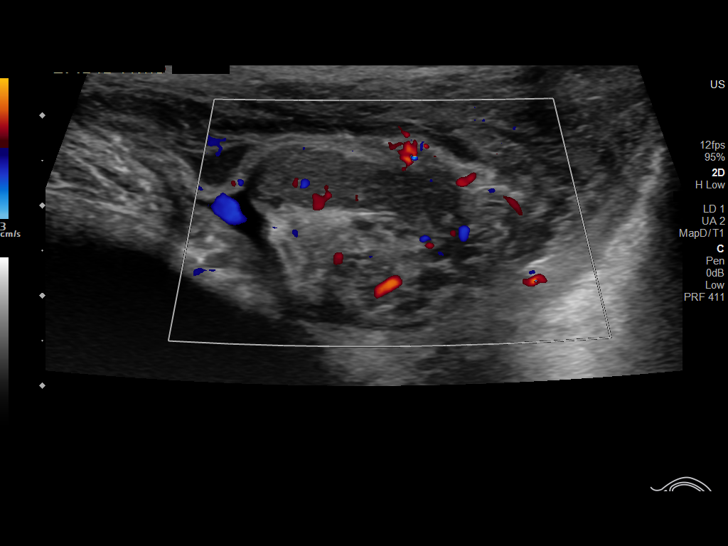

[13 of 25 positions shown; findings below may reference images not displayed]

FINDINGS: Right testicle

Measurements: 3.5 x 2.6 x 2.2 cm. Prominent diffuse microlithiasis
without a discrete mass lesion.

Left testicle

Measurements: 3.6 x 2.5 x 2.1 cm. Prominent diffuse microlithiasis
without a discrete mass lesion.

Right epididymis:  Normal in size and appearance.

Left epididymis:  4 mm epididymal cyst or spermatocele.

Hydrocele:  Bilateral small to moderate hydrocele noted.

Varicocele:  None visualized.

Pulsed Doppler interrogation of both testes demonstrates normal low
resistance arterial and venous waveforms bilaterally.
IMPRESSION: 1. No evidence for testicular portion or hyperemia to indicate
epididymo-orchitis no testicular mass lesion.
2. Small to moderate hydrocele bilaterally.
3. Prominent diffuse bilateral microlithiasis. Current literature
suggests that testicular microlithiasis is not a significant
independent risk factor for development of testicular carcinoma, and
that follow up imaging is not warranted in the absence of other risk
factors. Monthly testicular self-examination and annual physical
exams are considered appropriate surveillance. If patient has other
risk factors for testicular carcinoma, then referral to Urology
should be considered. (Reference: TEVES, et al.: A 5-Year Follow
up Study of Asymptomatic Men with Testicular Microlithiasis. J Urol
[NO]; 179:[PHONE_NUMBER].)
4. Tiny left epididymal cyst or spermatocele.

## 2020-10-28 IMAGING — CR DG CHEST 2V
2 series · 2 of 2 positions shown · non-contrast
Comparison: [DATE]

CLINICAL DATA: Lower extremity edema and dysuria.  Hypertension.

EXAM:
CHEST - 2 VIEW

[chest pa]
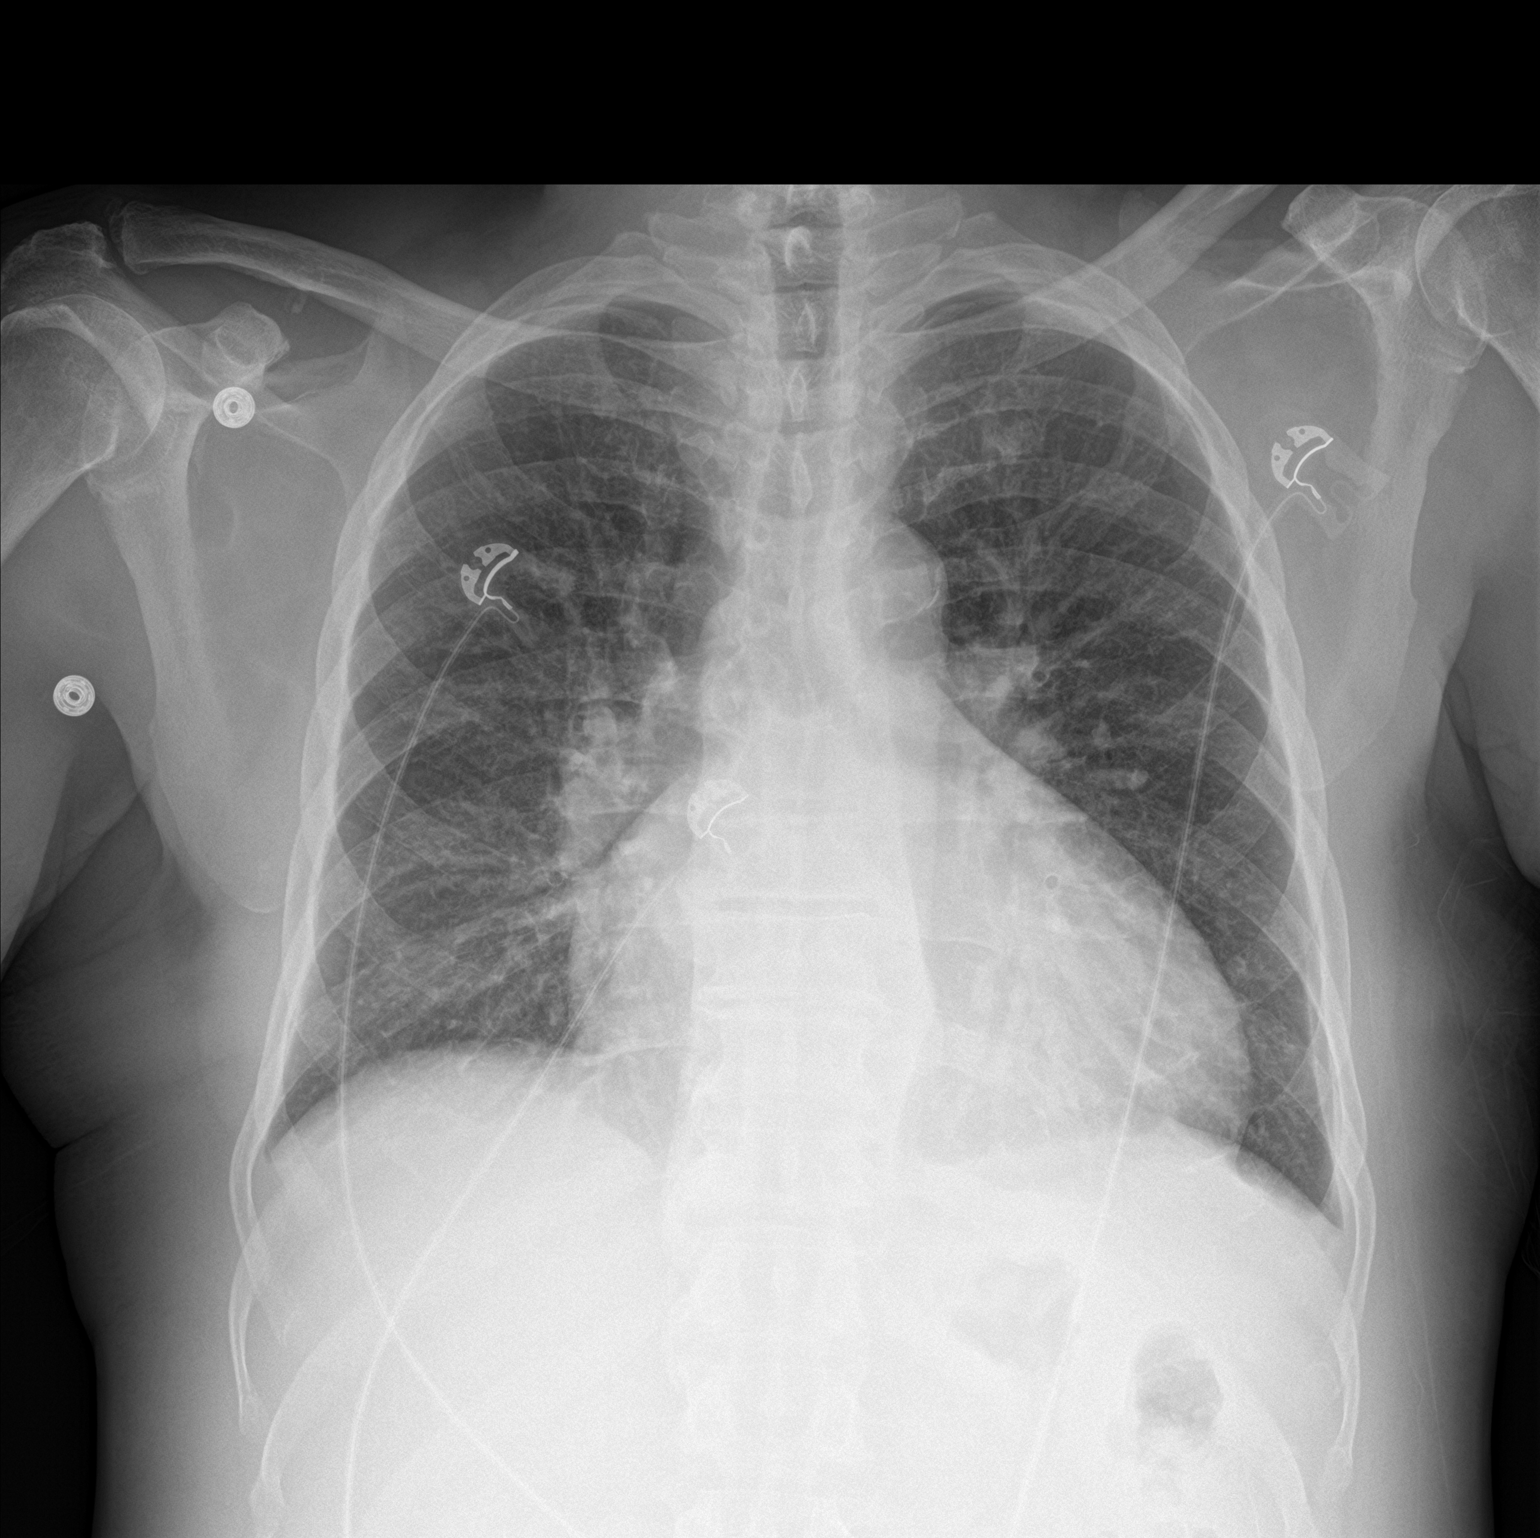

[chest lat]
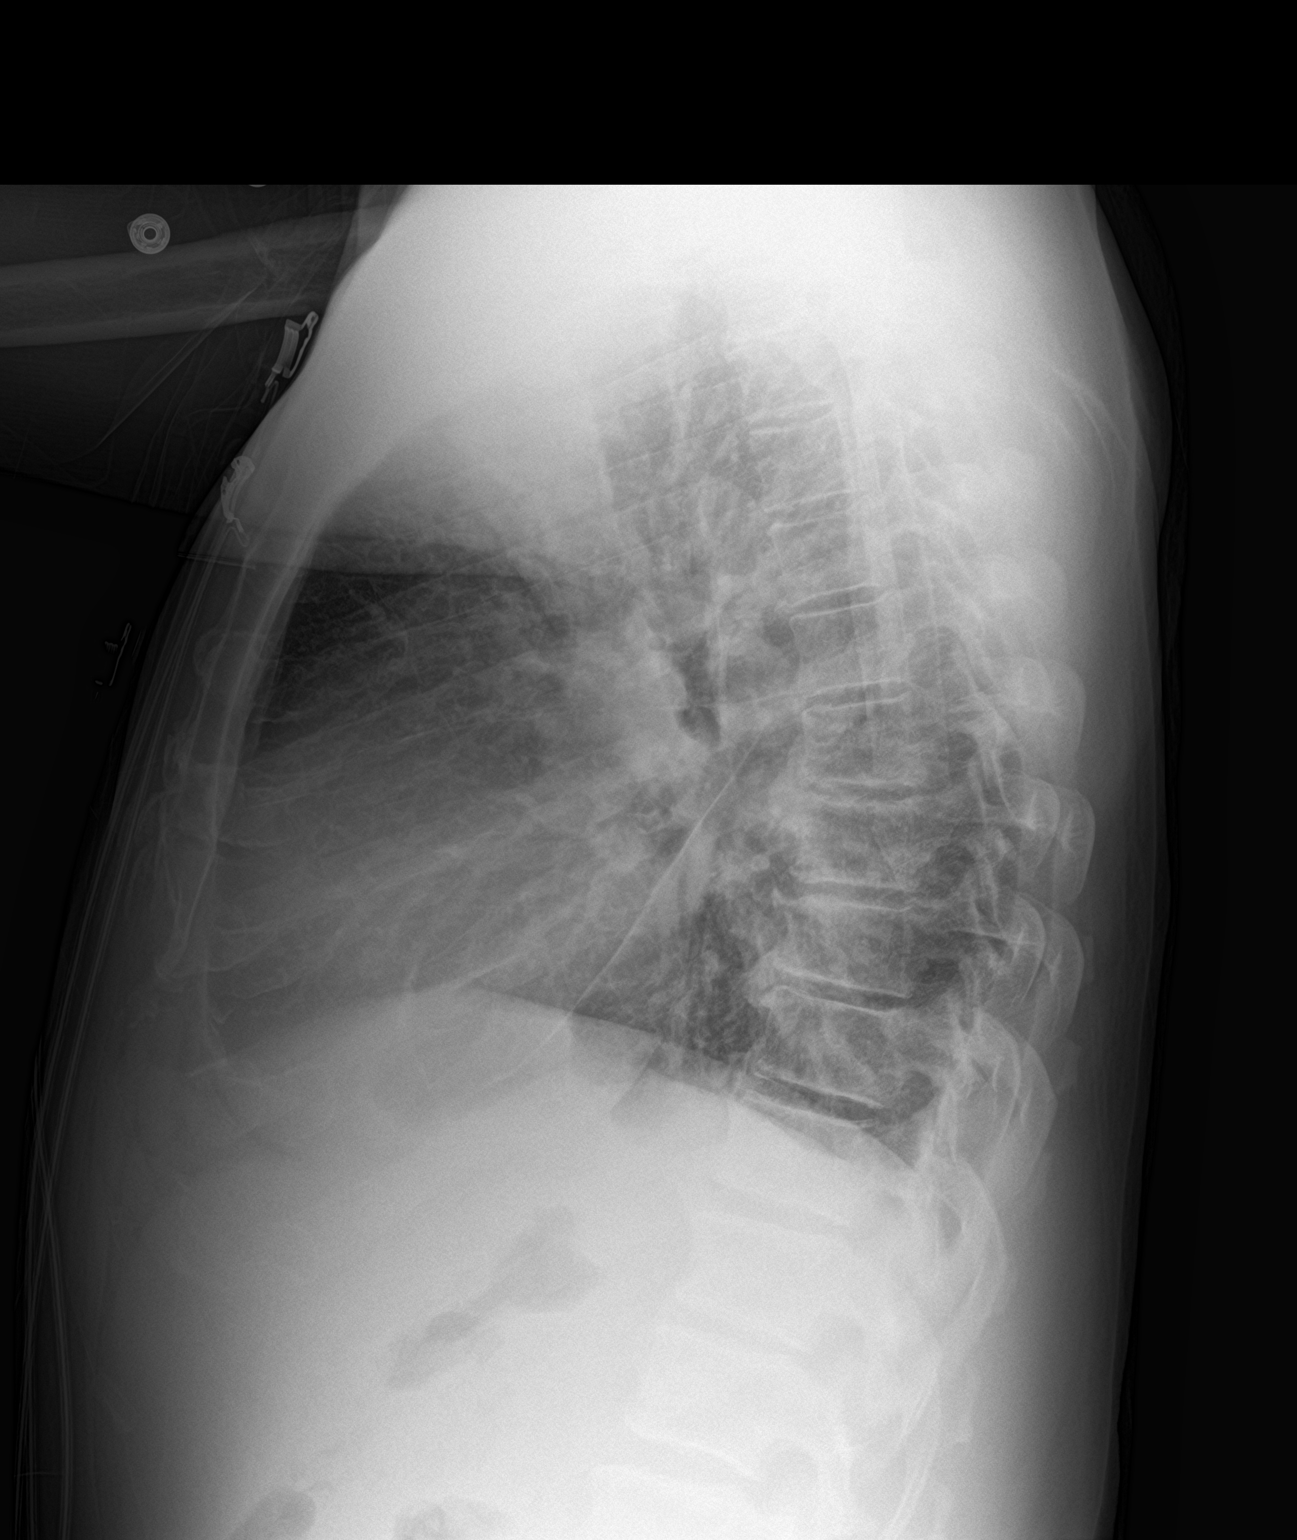

[2 of 2 positions shown; findings below may reference images not displayed]

FINDINGS: No edema or airspace opacity. There is cardiomegaly with slight
pulmonary venous hypertension. No adenopathy. There is aortic
atherosclerosis. No bone lesions.
IMPRESSION: Cardiomegaly with mild degree of pulmonary vascular congestion. No
edema or airspace opacity. Aortic Atherosclerosis ([05]-[05]).

## 2020-10-28 MED ORDER — HYDRALAZINE HCL 20 MG/ML IJ SOLN
10.0000 mg | INTRAMUSCULAR | Status: DC | PRN
  Administered 2020-10-28 – 2020-10-29 (×2): 10 mg via INTRAVENOUS
  Filled 2020-10-28 (×3): qty 1

## 2020-10-28 MED ORDER — ACETAMINOPHEN 325 MG PO TABS
650.0000 mg | ORAL_TABLET | ORAL | Status: DC | PRN
  Administered 2020-10-28 – 2020-10-29 (×2): 650 mg via ORAL
  Filled 2020-10-28 (×2): qty 2

## 2020-10-28 MED ORDER — HYDRALAZINE HCL 25 MG PO TABS
25.0000 mg | ORAL_TABLET | Freq: Once | ORAL | Status: AC
  Administered 2020-10-28: 25 mg via ORAL
  Filled 2020-10-28: qty 1

## 2020-10-28 MED ORDER — FUROSEMIDE 10 MG/ML IJ SOLN
40.0000 mg | Freq: Once | INTRAMUSCULAR | Status: AC
  Administered 2020-10-28: 40 mg via INTRAVENOUS
  Filled 2020-10-28: qty 4

## 2020-10-28 MED ORDER — HYDRALAZINE HCL 25 MG PO TABS: 25.0000 mg | ORAL_TABLET | Freq: Four times a day (QID) | ORAL | Status: DC | PRN

## 2020-10-28 MED ORDER — SODIUM CHLORIDE 0.9 % IV SOLN: 250.0000 mL | INTRAVENOUS | Status: DC | PRN

## 2020-10-28 MED ORDER — ONDANSETRON HCL 4 MG/2ML IJ SOLN: 4.0000 mg | Freq: Four times a day (QID) | INTRAMUSCULAR | Status: DC | PRN

## 2020-10-28 MED ORDER — FUROSEMIDE 10 MG/ML IJ SOLN
60.0000 mg | Freq: Two times a day (BID) | INTRAMUSCULAR | Status: DC
  Administered 2020-10-28: 60 mg via INTRAVENOUS
  Filled 2020-10-28 (×2): qty 6

## 2020-10-28 MED ORDER — DOXYCYCLINE HYCLATE 100 MG PO TABS
100.0000 mg | ORAL_TABLET | Freq: Two times a day (BID) | ORAL | Status: DC
  Administered 2020-10-28 – 2020-10-30 (×5): 100 mg via ORAL
  Filled 2020-10-28 (×5): qty 1

## 2020-10-28 MED ORDER — HEPARIN SODIUM (PORCINE) 5000 UNIT/ML IJ SOLN: 5000.0000 [IU] | Freq: Three times a day (TID) | INTRAMUSCULAR | Status: DC

## 2020-10-28 MED ORDER — AMLODIPINE BESYLATE 10 MG PO TABS
10.0000 mg | ORAL_TABLET | Freq: Every day | ORAL | Status: DC
  Administered 2020-10-28 – 2020-10-30 (×3): 10 mg via ORAL
  Filled 2020-10-28: qty 2
  Filled 2020-10-28 (×2): qty 1

## 2020-10-28 MED ORDER — METRONIDAZOLE 500 MG PO TABS
250.0000 mg | ORAL_TABLET | Freq: Two times a day (BID) | ORAL | Status: DC
  Administered 2020-10-28 – 2020-10-30 (×5): 250 mg via ORAL
  Filled 2020-10-28 (×5): qty 1

## 2020-10-28 MED ORDER — PANTOPRAZOLE SODIUM 40 MG PO TBEC
40.0000 mg | DELAYED_RELEASE_TABLET | Freq: Every day | ORAL | Status: DC
  Administered 2020-10-28 – 2020-10-30 (×3): 40 mg via ORAL
  Filled 2020-10-28 (×3): qty 1

## 2020-10-28 MED ORDER — FENTANYL CITRATE (PF) 100 MCG/2ML IJ SOLN
50.0000 ug | Freq: Once | INTRAMUSCULAR | Status: AC
Start: 2020-10-28 — End: 2020-10-28
  Administered 2020-10-28: 50 ug via INTRAVENOUS
  Filled 2020-10-28: qty 2

## 2020-10-28 MED ORDER — SODIUM CHLORIDE 0.9% FLUSH: 3.0000 mL | INTRAVENOUS | Status: DC | PRN

## 2020-10-28 MED ORDER — ISOSORBIDE MONONITRATE ER 30 MG PO TB24
30.0000 mg | ORAL_TABLET | Freq: Every day | ORAL | Status: DC
  Administered 2020-10-28 – 2020-10-30 (×3): 30 mg via ORAL
  Filled 2020-10-28 (×3): qty 1

## 2020-10-28 MED ORDER — CARVEDILOL 6.25 MG PO TABS
6.2500 mg | ORAL_TABLET | Freq: Two times a day (BID) | ORAL | Status: DC
  Administered 2020-10-28 – 2020-10-29 (×2): 6.25 mg via ORAL
  Filled 2020-10-28: qty 1
  Filled 2020-10-28: qty 2

## 2020-10-28 MED ORDER — POTASSIUM CHLORIDE CRYS ER 20 MEQ PO TBCR
40.0000 meq | EXTENDED_RELEASE_TABLET | Freq: Once | ORAL | Status: AC
  Administered 2020-10-28: 40 meq via ORAL
  Filled 2020-10-28: qty 2

## 2020-10-28 MED ORDER — FERROUS SULFATE 325 (65 FE) MG PO TABS
325.0000 mg | ORAL_TABLET | Freq: Every day | ORAL | Status: DC
  Administered 2020-10-29 – 2020-10-30 (×2): 325 mg via ORAL
  Filled 2020-10-28 (×3): qty 1

## 2020-10-28 MED ORDER — SODIUM CHLORIDE 0.9% FLUSH
3.0000 mL | Freq: Two times a day (BID) | INTRAVENOUS | Status: DC
  Administered 2020-10-28 – 2020-10-30 (×5): 3 mL via INTRAVENOUS

## 2020-10-28 NOTE — ED Notes (Signed)
Patient A/A/O, sitting up in bed eating lunch.

## 2020-10-28 NOTE — H&P (Addendum)
Date: 10/28/2020               Patient Name:  Jeffrey Zuniga MRN: [id number]  DOB: November 20, 1955 Age / Sex: 65 y.o., male   PCP: Patient, No Pcp Per (Inactive)         Medical Service: Internal Medicine Teaching Service         Attending Physician: Dr. Johnnye Sima    First Contact: Dr. Konrad Penta  Pager: [phone]  Second Contact: Marva Panda, MD, Loralyn [place] Pager: [place] [phone])       After Hours (After 5p/  First Contact Pager: [phone]  weekends / holidays): Second Contact Pager: [phone]   Chief Complaint: Scrotal and lower extremity edema  History of Present Illness: Jeffrey Zuniga is a 65 year old gentleman with medical history significant for uncontrolled hypertension, prediabetes, chronic kidney disease stage IV, improved heart failure with moderately reduced ejection fraction, iron deficiency anemia, H.pylori gastritis here with worsening scrotal and lower extremity edema.  Jeffrey Zuniga states that he was in his usual state of health until about a week ago when he began noticing scrotal swelling and lower extremity edema, up to his knee when he woke up from bed.  Also, he complains of fatigue with walking of the stairs and also endorses orthopnea.  Since the onset of his symptoms, he has not experienced headache, dizziness, lightheadedness, nausea, vomiting, abdominal pain, palpitation, fevers, chills or syncope.  He states that since his recent hospital discharge, he has not been able to follow-up with a physician outpatient due to change in his insurance.  In regards to his diet, he does endorse high sugar diet but tries to stay away from salt.  Per chart review, he was admitted to the hospital from September 01, 2020 to September 01, 2020 for management of severe symptomatic hypertension in the setting of not being able to have any of his medications due to insurance coverage, acute on chronic HFrEF, GI bleed when he endorsed melena.  He was found to have a hemoglobin of 6.4 and positive FOBT.  He underwent  endoscopy and colonoscopy with no obvious source of bleeding.  GI had recommended outpatient follow-up for pill endoscopy.  He was also found to have an acute on chronic CKD when his admission serum creatinine was 5.01 from a baseline of 3.9.  There was concern for multiple myeloma however motor murmur panel did not reveal an M spike.  Prior to this, he was admitted from April 01, 2020 to April 05, 2020 and was treated for symptomatic anemia, hypertensive emergency, acute renal failure and acute heart failure exacerbation.   Lab Orders     CBC with Differential     Basic metabolic panel     Urinalysis, Routine w reflex microscopic Urine, Clean Catch     Brain natriuretic peptide     Hepatic function panel     Creatinine, urine, random     Protein / creatinine ratio, urine     Sodium, urine, random     Magnesium     Hemoglobin A1c     POC occult blood, ED   Meds: Has not taken any in the past couple days No current facility-administered medications on file prior to encounter.   Current Outpatient Medications on File Prior to Encounter  Medication Sig  . amLODipine (NORVASC) 10 MG tablet Take 1 tablet (10 mg total) by mouth daily.  Marland Kitchen Amoxicill-Rifabutin-Omeprazole (TALICIA) 096-28.3-66 MG CPDR Take 4 tablets by mouth every 8 (eight) hours.  . bismuth subsalicylate (  PEPTO-BISMOL) 262 MG chewable tablet Chew 2 tablets (524 mg total) by mouth 4 (four) times daily for 14 days.  Marland Kitchen doxycycline (VIBRA-TABS) 100 MG tablet Take 1 tablet (100 mg total) by mouth 2 (two) times daily for 14 days.  Marland Kitchen ethacrynic acid (EDECRIN) 25 MG tablet Take 1 tablet (25 mg total) by mouth daily.  . ferrous sulfate 325 (65 FE) MG tablet Take 325 mg by mouth daily with breakfast.  . isosorbide mononitrate (IMDUR) 30 MG 24 hr tablet Take 1 tablet (30 mg total) by mouth daily.  . metroNIDAZOLE (FLAGYL) 250 MG tablet Take 1 tablet (250 mg total) by mouth 2 (two) times daily for 14 days.  Marland Kitchen omeprazole (PRILOSEC) 40  MG capsule Take 1 capsule (40 mg total) by mouth 2 (two) times daily for 14 days.     Allergies: Allergies as of 10/28/2020  . (No Known Allergies)   Past Medical History:  Diagnosis Date  . Acute on chronic kidney failure ([place])   . Anemia 04/04/2020  . Blood transfusion without reported diagnosis   . Diabetes mellitus without complication ([place])   . GI bleed   . H. pylori infection   . Hemorrhoids   . Hyperplastic colon polyp   . Hypertension     Family History: Father with HTN, CVA  Social History: Have been in [place] for about 5 years.  Drinks 124 ounce beer per day.  Smokes 1 pack of cigarettes which lasted 3 days which he picked back couple years ago.  Denies any illicit drug use.  Lives at home by himself and works as a Retail buyer active for college  Review of Systems: A complete ROS was negative except as per HPI.  As per HPI  Physical Exam: Blood pressure (!) 205/95, pulse 75, temperature 98.3 F (36.8 C), temperature source Oral, resp. rate 14, SpO2 100 %. Physical Exam Constitutional:      General: He is not in acute distress.    Appearance: He is not toxic-appearing.  HENT:     Head: Normocephalic.     Comments: +JVP    Mouth/Throat:     Mouth: Mucous membranes are moist.  Eyes:     Conjunctiva/sclera: Conjunctivae normal.  Cardiovascular:     Rate and Rhythm: Normal rate.     Heart sounds: Normal heart sounds. No murmur heard.   Pulmonary:     Breath sounds: Rales (Bibasilar) present.  Abdominal:     Palpations: Abdomen is soft.     Tenderness: There is no abdominal tenderness.  Genitourinary:    Testes:        Right: Tenderness, swelling and testicular hydrocele present.        Left: Tenderness, swelling and testicular hydrocele present.  Musculoskeletal:     Cervical back: Neck supple.     Right lower leg: Edema (2+) present.     Left lower leg: Edema (2+) present.     Comments: 2+ lower extremity edema up to the abdomen  Skin:    General:  Skin is warm.     Comments: Several old healed rashes noted on the abdomen  Neurological:     Mental Status: He is alert.     Comments: Alert, conversational  Psychiatric:        Mood and Affect: Mood normal.        Behavior: Behavior normal.     EKG: personally reviewed my interpretation is normal sinus rhythm  CXR: personally reviewed my interpretation is unremarkable with exception of  mild pulmonary vascular congestion  Assessment & Plan by Problem: Principal Problem:   Acute heart failure with reduced ejection fraction and diastolic dysfunction (HCC) Active Problems:   Acute kidney injury ([place])   Iron deficiency anemia   Severe asymptomatic hypertension   Helicobacter pylori gastritis  Jeffrey Zuniga is a 65 year old gentleman with medical history significant for uncontrolled hypertension, prediabetes, chronic kidney disease stage IV, improved heart failure with moderately reduced ejection fraction, iron deficiency anemia, H.pylori gastritis here for management of anasarca, acute on chronic combined heart failure   #Anasarca  #Acute on chronic combined systolic and diastolic heart failure #Mild flash pulmonary edema Presents with worsening significant lower extremity and scrotal edema for several weeks in the setting of medication nonadherence.  Found to have an elevated BNP of greater than 4500.  During his last hospitalization, his discharge weight was 67 kg.  He received IV Lasix 40 mg in the ED. during his initial hospitalization, his echocardiogram showed heart failure with mildly reduced ejection fraction with an EF of 45-50%.  There was concern for possible cardiac amyloidosis.  He did have protein electrophoresis performed during his previous hospitalization which did not reveal an M spike but did show significantly elevated IgG of 2 g with elevated kappa to lambda ratio of 2.11.  I am suspicious of the possibility of cardiac amyloidosis and will consider an outpatient cardiac  amyloid scan once he is stabilized -Start IV Furosemide 60 mg twice daily -Trend BMP -Mag level -Strict I&Os -Daily Weights -Fluid restriction -Keep O2 sat >88 -Replenish K as needed >4.0 -C/w home meds:  Coreg 6.25 mg twice daily, isosorbide mononitrate 30 mg daily -Follow-up repeat troponin -Consider cardiac amyloid scan when stable   #Acute on chronic CKD stage IV #Proteinuria #Nephrotic syndrome of unclear etiology #Non-anion gap metabolic acidosis His last baseline serum creatinine at discharge was 4.8-5.1.  Prior to this, his baseline was 3.9.  Urinalysis showed proteinuria greater than 300.  His nephrotic syndrome could be secondary to severe uncontrolled hypertension. -Follow-up LFTs to determine serum albumin level -Follow-up serum protein to creatinine ratio -Consult nephrology if no improvement in his renal function -Follow up Bladder scan -Follow up Urine Na/Cr -Daily weights, Daily Renal Panel, Strict I/Os, Avoid nephrotoxins (NSAIDs, judicious IV Contrast)  Medication Issues; ? Preferred narcotic agents for pain control are hydromorphone, fentanyl, and methadone. Morphine should not be used.  ? Baclofen should be avoided ? Avoid oral sodium phosphate and magnesium citrate based laxatives / bowel preps    #Severe asymptomatic hypertension Goal BP 170. Multiple hospitalizations with hypertensive emergency.  BP on arrival to the ED 223/105. Will give IV hydralazine $RemoveBefore'10mg'[id number]$  q4 prn, home meds and IV lasix 80 mg -Home regimen includes amlodipine 10 mg daily, Coreg 6.25 mg twice daily, isosorbide mononitrate 30 mg daily -Resume home meds -IV hydralazine 10 mg q4h prn -IV Lasix $RemoveB'60mg'nyumZIuy$  BID   #Chronic normocytic anemia #Iron deficiency anemia #Anemia of chronic/renal disease Hemoglobin 7.1 from baseline of 8.6 in March 2022. -Trend CBC and transfuse to maintain hemoglobin greater than 7   #Microscopic hematuria UA shows small hemoglobin.  This was discovered during one  of his previous past.  Thankfully, microscopy does not show any RBC casts making acute glomerulonephritis less likely   #H. Pylori gastritis  -Doxycycline 100 mg twice daily for 14 days -Flagyl 2050 mg twice daily for 14 days -Protonix 40 mg twice daily for 14 days.   #Prediabetes - Follow-up hemoglobin A1c   #Bilateral small to  moderate hydrocele -Continue IV diuresis   FEN: Heart healthy VTE ppx: SCD CODE STATUS: Full code  Prior to Admission Living Arrangement: Home Anticipated Discharge Location: Pending Barriers to Discharge: Acute heart failure exacerbation  Dispo: Admit patient to Inpatient with expected length of stay greater than 2 midnights.  Signed: Jean Rosenthal, MD 10/28/2020, 11:29 AM  Pager: [phone] Internal Medicine Teaching Service After 5pm on weekdays and 1pm on weekends: On Call pager: [phone]

## 2020-10-28 NOTE — ED Notes (Signed)
Dr Roslynn Amble is at bedside and is aware of pt's bp

## 2020-10-28 NOTE — ED Notes (Signed)
Patient transported to Ultrasound 

## 2020-10-28 NOTE — ED Triage Notes (Signed)
Patient reports bilateral testicular swelling and bilateral upper legs swelling onset this week , mild dysuria , denies fever or chills. Hypertensive at triage .

## 2020-10-28 NOTE — ED Notes (Signed)
Pt has 4+ scrotal and penis edema. Pt has 3+ upper leg edema bilat.

## 2020-10-28 NOTE — ED Notes (Addendum)
Patient transported to XR. 

## 2020-10-28 NOTE — ED Provider Notes (Signed)
[facility] EMERGENCY DEPARTMENT Provider Note   CSN: [id number] Arrival date & time: 10/28/20  [id number]     History Chief Complaint  Patient presents with  . Testicles/Upper Legs Swelling    Hypertensive    Jeffrey Zuniga is a 65 y.o. male.  Presented to ER with concern for swelling.  Patient states that he is had significant swelling over the past couple weeks to his legs and testicles.  States that the swelling is causing a severe amount of pain in both of his testicles.  Not taking his Lasix medicine anymore.  History of poorly controlled hypertension.  History of hypertensive emergency.  HPI     Past Medical History:  Diagnosis Date  . Acute on chronic kidney failure ([place])   . Anemia 04/04/2020  . Blood transfusion without reported diagnosis   . Diabetes mellitus without complication ([place])   . GI bleed   . H. pylori infection   . Hemorrhoids   . Hyperplastic colon polyp   . Hypertension     Patient Active Problem List   Diagnosis Date Noted  . Acute on chronic heart failure ([place]) 10/28/2020  . Helicobacter pylori gastritis 10/28/2020  . Severe asymptomatic hypertension 08/29/2020  . Acute on chronic combined systolic and diastolic congestive heart failure ([place])   . LVH (left ventricular hypertrophy) 04/04/2020  . Iron deficiency anemia 04/02/2020  . Acute heart failure with reduced ejection fraction and diastolic dysfunction ([place]) 04/02/2020  . Symptomatic anemia 04/01/2020  . Hypertensive emergency 04/01/2020  . Acute kidney injury ([place]) 04/01/2020  . Bilateral lower extremity edema 04/01/2020    Past Surgical History:  Procedure Laterality Date  . BIOPSY  08/31/2020   Procedure: BIOPSY;  Surgeon: Rush Landmark Telford Nab., MD;  Location: [place];  Service: Gastroenterology;;  . CARDIAC CATHETERIZATION    . COLONOSCOPY WITH PROPOFOL N/A 08/31/2020   Procedure: COLONOSCOPY WITH PROPOFOL;  Surgeon: Rush Landmark Telford Nab., MD;  Location: [place];  Service: Gastroenterology;  Laterality: N/A;  . ESOPHAGOGASTRODUODENOSCOPY (EGD) WITH PROPOFOL N/A 08/31/2020   Procedure: ESOPHAGOGASTRODUODENOSCOPY (EGD) WITH PROPOFOL;  Surgeon: Rush Landmark Telford Nab., MD;  Location: [place];  Service: Gastroenterology;  Laterality: N/A;  . POLYPECTOMY  08/31/2020   Procedure: POLYPECTOMY;  Surgeon: Rush Landmark Telford Nab., MD;  Location: [place];  Service: Gastroenterology;;       No family history on file.  Social History   Tobacco Use  . Smoking status: Current Every Day Smoker    Packs/day: 0.25    Types: Cigarettes  . Smokeless tobacco: Never Used  . Tobacco comment: 2021  " i AM CUTTING BACK :"  Vaping Use  . Vaping Use: Never used  Substance Use Topics  . Alcohol use: Not Currently  . Drug use: Never    Home Medications Prior to Admission medications   Medication Sig Start Date End Date Taking? Authorizing Provider  amLODipine (NORVASC) 10 MG tablet Take 1 tablet (10 mg total) by mouth daily. 09/02/20  Yes Katsadouros, Vasilios, MD  Amoxicill-Rifabutin-Omeprazole (TALICIA) 99991111 MG CPDR Take 4 tablets by mouth every 8 (eight) hours. 09/09/20  Yes Pyrtle, Lajuan Lines, MD  bismuth subsalicylate (PEPTO-BISMOL) 262 MG chewable tablet Chew 2 tablets (524 mg total) by mouth 4 (four) times daily for 14 days. 10/21/20 11/04/20 Yes Pyrtle, Lajuan Lines, MD  doxycycline (VIBRA-TABS) 100 MG tablet Take 1 tablet (100 mg total) by mouth 2 (two) times daily for 14 days. 10/21/20 11/04/20 Yes Pyrtle, Lajuan Lines, MD  ethacrynic acid (EDECRIN) 25  MG tablet Take 1 tablet (25 mg total) by mouth daily. 09/01/20  Yes Katsadouros, Vasilios, MD  ferrous sulfate 325 (65 FE) MG tablet Take 325 mg by mouth daily with breakfast.   Yes [provider]  isosorbide mononitrate (IMDUR) 30 MG 24 hr tablet Take 1 tablet (30 mg total) by mouth daily. 09/02/20  Yes Katsadouros, Vasilios, MD  metroNIDAZOLE (FLAGYL) 250 MG tablet Take 1 tablet (250 mg total) by  mouth 2 (two) times daily for 14 days. 10/21/20 11/04/20 Yes Pyrtle, Lajuan Lines, MD  omeprazole (PRILOSEC) 40 MG capsule Take 1 capsule (40 mg total) by mouth 2 (two) times daily for 14 days. 10/21/20 11/04/20 Yes Pyrtle, Lajuan Lines, MD    Allergies    Patient has no known allergies.  Review of Systems   Review of Systems  Constitutional: Negative for chills and fever.  HENT: Negative for ear pain and sore throat.   Eyes: Negative for pain and visual disturbance.  Respiratory: Negative for cough and shortness of breath.   Cardiovascular: Positive for leg swelling. Negative for chest pain and palpitations.  Gastrointestinal: Negative for abdominal pain and vomiting.  Genitourinary: Positive for scrotal swelling. Negative for dysuria and hematuria.  Musculoskeletal: Negative for arthralgias and back pain.  Skin: Negative for color change and rash.  Neurological: Negative for seizures and syncope.  All other systems reviewed and are negative.   Physical Exam Updated Vital Signs BP (!) 162/91 (BP Location: Left Arm)   Pulse 81   Temp 98.2 F (36.8 C) (Oral)   Resp 15   SpO2 100%   Physical Exam Vitals and nursing note reviewed.  Constitutional:      Appearance: He is well-developed.  HENT:     Head: Normocephalic and atraumatic.  Eyes:     Conjunctiva/sclera: Conjunctivae normal.  Cardiovascular:     Rate and Rhythm: Normal rate and regular rhythm.     Heart sounds: No murmur heard.   Pulmonary:     Effort: Pulmonary effort is normal. No respiratory distress.     Breath sounds: Normal breath sounds.  Abdominal:     Palpations: Abdomen is soft.     Tenderness: There is no abdominal tenderness.  Genitourinary:    Comments: Bilateral scrotal swelling and penile shaft swelling, no erythema Musculoskeletal:     Cervical back: Neck supple.     Comments: Bilateral pitting edema in lower legs that extends to the upper legs  Skin:    General: Skin is warm and dry.  Neurological:      Mental Status: He is alert.     ED Results / Procedures / Treatments   Labs (all labs ordered are listed, but only abnormal results are displayed) Labs Reviewed  CBC WITH DIFFERENTIAL/PLATELET - Abnormal; Notable for the following components:      Result Value   RBC 2.70 (*)    Hemoglobin 7.1 (*)    HCT 23.8 (*)    MCHC 29.8 (*)    RDW 17.2 (*)    All other components within normal limits  BASIC METABOLIC PANEL - Abnormal; Notable for the following components:   CO2 18 (*)    Glucose, Bld 106 (*)    BUN 45 (*)    Creatinine, Ser 5.15 (*)    Calcium 7.7 (*)    GFR, Estimated 12 (*)    All other components within normal limits  URINALYSIS, ROUTINE W REFLEX MICROSCOPIC - Abnormal; Notable for the following components:   Glucose, UA 50 (*)  Hgb urine dipstick SMALL (*)    Protein, ur >=300 (*)    Bacteria, UA RARE (*)    All other components within normal limits  BRAIN NATRIURETIC PEPTIDE - Abnormal; Notable for the following components:   B Natriuretic Peptide >4,500.0 (*)    All other components within normal limits  HEPATIC FUNCTION PANEL - Abnormal; Notable for the following components:   Albumin 2.4 (*)    All other components within normal limits  PROTEIN / CREATININE RATIO, URINE - Abnormal; Notable for the following components:   Protein Creatinine Ratio 7.11 (*)    All other components within normal limits  TROPONIN I (HIGH SENSITIVITY) - Abnormal; Notable for the following components:   Troponin I (High Sensitivity) 51 (*)    All other components within normal limits  TROPONIN I (HIGH SENSITIVITY) - Abnormal; Notable for the following components:   Troponin I (High Sensitivity) 44 (*)    All other components within normal limits  CREATININE, URINE, RANDOM  SODIUM, URINE, RANDOM  MAGNESIUM  HEMOGLOBIN A1C  POC OCCULT BLOOD, ED    EKG None  Radiology DG Chest 2 View  Result Date: 10/28/2020 CLINICAL DATA:  Lower extremity edema and dysuria.   Hypertension. EXAM: CHEST - 2 VIEW COMPARISON:  August 29, 2020 FINDINGS: No edema or airspace opacity. There is cardiomegaly with slight pulmonary venous hypertension. No adenopathy. There is aortic atherosclerosis. No bone lesions. IMPRESSION: Cardiomegaly with mild degree of pulmonary vascular congestion. No edema or airspace opacity. Aortic Atherosclerosis (ICD10-I70.0). Electronically Signed   By: Lowella Grip III M.D.   On: 10/28/2020 08:55   US SCROTUM W/DOPPLER  Result Date: 10/28/2020 CLINICAL DATA:  Severe testicular pain. EXAM: SCROTAL ULTRASOUND DOPPLER ULTRASOUND OF THE TESTICLES TECHNIQUE: Complete ultrasound examination of the testicles, epididymis, and other scrotal structures was performed. Color and spectral Doppler ultrasound were also utilized to evaluate blood flow to the testicles. COMPARISON:  None. FINDINGS: Right testicle Measurements: 3.5 x 2.6 x 2.2 cm. Prominent diffuse microlithiasis without a discrete mass lesion. Left testicle Measurements: 3.6 x 2.5 x 2.1 cm. Prominent diffuse microlithiasis without a discrete mass lesion. Right epididymis:  Normal in size and appearance. Left epididymis:  4 mm epididymal cyst or spermatocele. Hydrocele:  Bilateral small to moderate hydrocele noted. Varicocele:  None visualized. Pulsed Doppler interrogation of both testes demonstrates normal low resistance arterial and venous waveforms bilaterally. IMPRESSION: 1. No evidence for testicular portion or hyperemia to indicate epididymo-orchitis no testicular mass lesion. 2. Small to moderate hydrocele bilaterally. 3. Prominent diffuse bilateral microlithiasis. Current literature suggests that testicular microlithiasis is not a significant independent risk factor for development of testicular carcinoma, and that follow up imaging is not warranted in the absence of other risk factors. Monthly testicular self-examination and annual physical exams are considered appropriate surveillance. If patient  has other risk factors for testicular carcinoma, then referral to Urology should be considered. (Reference: DeCastro, et al.: A 5-Year Follow up Study of Asymptomatic Men with Testicular Microlithiasis. J Urol 2008; [id number].) 4. Tiny left epididymal cyst or spermatocele. Electronically Signed   By: Misty Stanley M.D.   On: 10/28/2020 10:10    Procedures Procedures   Medications Ordered in ED Medications  furosemide (LASIX) injection 60 mg (has no administration in time range)  amLODipine (NORVASC) tablet 10 mg (10 mg Oral Given 10/28/20 1146)  isosorbide mononitrate (IMDUR) 24 hr tablet 30 mg (30 mg Oral Given 10/28/20 1145)  carvedilol (COREG) tablet 6.25 mg (has no administration in time  range)  sodium chloride flush (NS) 0.9 % injection 3 mL (3 mLs Intravenous Given 10/28/20 1147)  sodium chloride flush (NS) 0.9 % injection 3 mL (has no administration in time range)  0.9 %  sodium chloride infusion (has no administration in time range)  acetaminophen (TYLENOL) tablet 650 mg (has no administration in time range)  ondansetron (ZOFRAN) injection 4 mg (has no administration in time range)  doxycycline (VIBRA-TABS) tablet 100 mg (100 mg Oral Given 10/28/20 1145)  metroNIDAZOLE (FLAGYL) tablet 250 mg (250 mg Oral Given 10/28/20 1145)  pantoprazole (PROTONIX) EC tablet 40 mg (40 mg Oral Given 10/28/20 1144)  ferrous sulfate tablet 325 mg (has no administration in time range)  hydrALAZINE (APRESOLINE) injection 10 mg (10 mg Intravenous Given 10/28/20 1307)  fentaNYL (SUBLIMAZE) injection 50 mcg (50 mcg Intravenous Given 10/28/20 0907)  hydrALAZINE (APRESOLINE) tablet 25 mg (25 mg Oral Given 10/28/20 0907)  furosemide (LASIX) injection 40 mg (40 mg Intravenous Given 10/28/20 1025)  potassium chloride SA (KLOR-CON) CR tablet 40 mEq (40 mEq Oral Given 10/28/20 1026)  furosemide (LASIX) injection 40 mg (40 mg Intravenous Given 10/28/20 1146)    ED Course  I have reviewed the triage vital signs and the  nursing notes.  Pertinent labs & imaging results that were available during my care of the patient were reviewed by me and considered in my medical decision making (see chart for details).    MDM Rules/Calculators/A&P                         65 year old male with concern for leg swelling, scrotal swelling.  Patient has extensive pitting edema in his legs extending into his scrotum.  Suspect fluid overload state from heart disease, kidney disease.  Still makes urine.  Creatinine stable.  Believe he would benefit from IV diuresis and admission for further observation.  Will start with dose of IV Lasix, consult medicine for admit.  Final Clinical Impression(s) / ED Diagnoses Final diagnoses:  Acute on chronic heart failure, unspecified heart failure type (HCC)  Hypervolemia, unspecified hypervolemia type  Chronic kidney disease, unspecified CKD stage  Hypertensive urgency    Rx / DC Orders ED Discharge Orders    None       Lucrezia Starch, MD 10/28/20 1527

## 2020-10-29 ENCOUNTER — Inpatient Hospital Stay (HOSPITAL_COMMUNITY): Payer: 59

## 2020-10-29 DIAGNOSIS — I5031 Acute diastolic (congestive) heart failure: Secondary | ICD-10-CM

## 2020-10-29 LAB — CBC WITH DIFFERENTIAL/PLATELET
Abs Immature Granulocytes: 0.01 10*3/uL (ref 0.00–0.07)
Basophils Absolute: 0 10*3/uL (ref 0.0–0.1)
Basophils Relative: 0 %
Eosinophils Absolute: 0.2 10*3/uL (ref 0.0–0.5)
Eosinophils Relative: 4 %
HCT: 21 % — ABNORMAL LOW (ref 39.0–52.0)
Hemoglobin: 6.5 g/dL — CL (ref 13.0–17.0)
Immature Granulocytes: 0 %
Lymphocytes Relative: 11 %
Lymphs Abs: 0.6 10*3/uL — ABNORMAL LOW (ref 0.7–4.0)
MCH: 26.5 pg (ref 26.0–34.0)
MCHC: 31 g/dL (ref 30.0–36.0)
MCV: 85.7 fL (ref 80.0–100.0)
Monocytes Absolute: 0.6 10*3/uL (ref 0.1–1.0)
Monocytes Relative: 11 %
Neutro Abs: 3.8 10*3/uL (ref 1.7–7.7)
Neutrophils Relative %: 74 %
Platelets: 181 10*3/uL (ref 150–400)
RBC: 2.45 MIL/uL — ABNORMAL LOW (ref 4.22–5.81)
RDW: 17.1 % — ABNORMAL HIGH (ref 11.5–15.5)
WBC: 5.2 10*3/uL (ref 4.0–10.5)
nRBC: 0 % (ref 0.0–0.2)

## 2020-10-29 LAB — ECHOCARDIOGRAM COMPLETE
AR max vel: 1.72 cm2
AV Area VTI: 1.79 cm2
AV Area mean vel: 1.46 cm2
AV Mean grad: 9 mmHg
AV Peak grad: 14 mmHg
Ao pk vel: 1.87 m/s
Area-P 1/2: 5.02 cm2
Height: 67 in
MV VTI: 1.74 cm2
S' Lateral: 3.5 cm
Single Plane A4C EF: 62.5 %
Weight: 2843.05 oz

## 2020-10-29 LAB — COMPREHENSIVE METABOLIC PANEL
ALT: 17 U/L (ref 0–44)
AST: 21 U/L (ref 15–41)
Albumin: 2 g/dL — ABNORMAL LOW (ref 3.5–5.0)
Alkaline Phosphatase: 66 U/L (ref 38–126)
Anion gap: 7 (ref 5–15)
BUN: 45 mg/dL — ABNORMAL HIGH (ref 8–23)
CO2: 20 mmol/L — ABNORMAL LOW (ref 22–32)
Calcium: 7.5 mg/dL — ABNORMAL LOW (ref 8.9–10.3)
Chloride: 111 mmol/L (ref 98–111)
Creatinine, Ser: 5.4 mg/dL — ABNORMAL HIGH (ref 0.61–1.24)
GFR, Estimated: 11 mL/min — ABNORMAL LOW (ref >60)
Glucose, Bld: 129 mg/dL — ABNORMAL HIGH (ref 70–99)
Potassium: 4.3 mmol/L (ref 3.5–5.1)
Sodium: 138 mmol/L (ref 135–145)
Total Bilirubin: 0.4 mg/dL (ref 0.3–1.2)
Total Protein: 6 g/dL — ABNORMAL LOW (ref 6.5–8.1)

## 2020-10-29 LAB — HEMOGLOBIN AND HEMATOCRIT, BLOOD
HCT: 24.6 % — ABNORMAL LOW (ref 39.0–52.0)
Hemoglobin: 7.6 g/dL — ABNORMAL LOW (ref 13.0–17.0)

## 2020-10-29 LAB — PHOSPHORUS: Phosphorus: 4.5 mg/dL (ref 2.5–4.6)

## 2020-10-29 LAB — HEPATITIS C ANTIBODY: HCV Ab: REACTIVE — AB

## 2020-10-29 LAB — HIV ANTIBODY (ROUTINE TESTING W REFLEX): HIV Screen 4th Generation wRfx: NONREACTIVE

## 2020-10-29 LAB — HEPATITIS B SURFACE ANTIGEN: Hepatitis B Surface Ag: NONREACTIVE

## 2020-10-29 LAB — SARS CORONAVIRUS 2 (TAT 6-24 HRS): SARS Coronavirus 2: NEGATIVE

## 2020-10-29 LAB — PREPARE RBC (CROSSMATCH)

## 2020-10-29 MED ORDER — DARBEPOETIN ALFA 60 MCG/0.3ML IJ SOSY
60.0000 ug | PREFILLED_SYRINGE | Freq: Once | INTRAMUSCULAR | Status: AC
  Administered 2020-10-29: 60 ug via SUBCUTANEOUS
  Filled 2020-10-29: qty 0.3

## 2020-10-29 MED ORDER — FUROSEMIDE 10 MG/ML IJ SOLN
60.0000 mg | Freq: Once | INTRAMUSCULAR | Status: AC
  Administered 2020-10-29: 60 mg via INTRAVENOUS

## 2020-10-29 MED ORDER — SODIUM CHLORIDE 0.9% IV SOLUTION: Freq: Once | INTRAVENOUS | Status: DC

## 2020-10-29 MED ORDER — PNEUMOCOCCAL VAC POLYVALENT 25 MCG/0.5ML IJ INJ
0.5000 mL | INJECTION | INTRAMUSCULAR | Status: AC
  Administered 2020-10-30: 0.5 mL via INTRAMUSCULAR
  Filled 2020-10-29: qty 0.5

## 2020-10-29 MED ORDER — SODIUM CHLORIDE 0.9 % IV SOLN
250.0000 mg | Freq: Every day | INTRAVENOUS | Status: AC
  Administered 2020-10-29 – 2020-10-30 (×2): 250 mg via INTRAVENOUS
  Filled 2020-10-29 (×2): qty 20

## 2020-10-29 MED ORDER — CARVEDILOL 12.5 MG PO TABS
12.5000 mg | ORAL_TABLET | Freq: Two times a day (BID) | ORAL | Status: DC
  Administered 2020-10-29 – 2020-10-30 (×2): 12.5 mg via ORAL
  Filled 2020-10-29 (×2): qty 1

## 2020-10-29 MED ORDER — FUROSEMIDE 40 MG PO TABS
40.0000 mg | ORAL_TABLET | Freq: Two times a day (BID) | ORAL | Status: DC
  Administered 2020-10-30: 40 mg via ORAL
  Filled 2020-10-29: qty 1

## 2020-10-29 NOTE — Consult Note (Addendum)
[facility] Nephrology Consultation Note  Requesting MD: Dr Johnnye Sima, Dellis Filbert Reason for consult: AKI on CKD  HPI:  Jeffrey Zuniga is a 65 y.o. male with past medical history of type II DM, hypertension, GI bleed, CKD stage IV/V who was presented on 5/20 for worsening lower extremity edema, scrotal swelling and dyspnea on exertion for about a week seen as a consultation for AKI on CKD. Patient reports uncontrolled hypertension for last few years.  He has been admitted to the hospital in 03/2020 and last in 08/2020 with CHF exacerbation when he was found to have nephrotic range proteinuria and elevated creatinine level up to 5.  Per patient he has never seen a nephrologist before however recently he was referred to [place] [facility].  He missed his appointment.  Recently his creatinine level around 4.8- 5.1.  Urinalysis with 7.1 g of proteinuria without microscopic hematuria.  He had kidney ultrasound done in 08/2020 with bilateral increased echogenicity/chronicity without any acute finding.  He does not take any diuretics.  [facility] with EF of 60 to 65% and has grade second diastolic dysfunction/mild concentric LVH. On admission, he had elevated blood pressure SBP >200, in room air.  The labs showed creatinine level of 5.15, potassium 3.8, BNP more than 4500.  He was treated with IV Lasix with urine output of 5.3 L so far.  Patient reports that his leg and scrotal swelling is much better now.  He feels his breathing is improving as well. He denies headache, dizziness, nausea, vomiting, chest pain.  No urinary complaint.  He was recently diagnosed with H. pylori and on treatment.  Creatinine, Ser  Date/Time Value Ref Range Status  10/29/2020 02:48 AM 5.40 (H) 0.61 - 1.24 mg/dL Final  10/28/2020 06:19 AM 5.15 (H) 0.61 - 1.24 mg/dL Final  09/01/2020 01:34 AM 5.16 (H) 0.61 - 1.24 mg/dL Final  08/31/2020 01:48 AM 4.87 (H) 0.61 - 1.24 mg/dL Final  08/30/2020 02:01 AM 4.84 (H) 0.61 - 1.24 mg/dL  Final  08/29/2020 01:38 PM 4.89 (H) 0.61 - 1.24 mg/dL Final  08/29/2020 07:55 AM 5.01 (H) 0.61 - 1.24 mg/dL Final  04/05/2020 12:40 AM 3.92 (H) 0.61 - 1.24 mg/dL Final  04/04/2020 03:13 AM 3.69 (H) 0.61 - 1.24 mg/dL Final  04/03/2020 09:37 AM 3.67 (H) 0.61 - 1.24 mg/dL Final  04/02/2020 02:30 AM 3.70 (H) 0.61 - 1.24 mg/dL Final  04/01/2020 03:58 PM 4.12 (H) 0.61 - 1.24 mg/dL Final     PMHx:   Past Medical History:  Diagnosis Date  . Acute on chronic kidney failure ([place])   . Anemia 04/04/2020  . Blood transfusion without reported diagnosis   . Diabetes mellitus without complication ([place])   . GI bleed   . H. pylori infection   . Hemorrhoids   . Hyperplastic colon polyp   . Hypertension     Past Surgical History:  Procedure Laterality Date  . BIOPSY  08/31/2020   Procedure: BIOPSY;  Surgeon: Rush Landmark Telford Nab., MD;  Location: [facility];  Service: Gastroenterology;;  . CARDIAC CATHETERIZATION    . COLONOSCOPY WITH PROPOFOL N/A 08/31/2020   Procedure: COLONOSCOPY WITH PROPOFOL;  Surgeon: Rush Landmark Telford Nab., MD;  Location: [facility];  Service: Gastroenterology;  Laterality: N/A;  . ESOPHAGOGASTRODUODENOSCOPY (EGD) WITH PROPOFOL N/A 08/31/2020   Procedure: ESOPHAGOGASTRODUODENOSCOPY (EGD) WITH PROPOFOL;  Surgeon: Rush Landmark Telford Nab., MD;  Location: [facility];  Service: Gastroenterology;  Laterality: N/A;  . POLYPECTOMY  08/31/2020   Procedure: POLYPECTOMY;  Surgeon: Irving Copas., MD;  Location[facility];  Service: Gastroenterology;;    Family Hx: History reviewed. No pertinent family history.  Social History:  reports that he has been smoking cigarettes. He has been smoking about 0.25 packs per day. He has never used smokeless tobacco. He reports previous alcohol use. He reports that he does not use drugs.  Allergies: No Known Allergies  Medications: Prior to Admission medications   Medication Sig Start Date End Date Taking? Authorizing  Provider  amLODipine (NORVASC) 10 MG tablet Take 1 tablet (10 mg total) by mouth daily. 09/02/20  Yes Katsadouros, Vasilios, MD  Amoxicill-Rifabutin-Omeprazole (TALICIA) 99991111 MG CPDR Take 4 tablets by mouth every 8 (eight) hours. 09/09/20  Yes Pyrtle, Lajuan Lines, MD  bismuth subsalicylate (PEPTO-BISMOL) 262 MG chewable tablet Chew 2 tablets (524 mg total) by mouth 4 (four) times daily for 14 days. 10/21/20 11/04/20 Yes Pyrtle, Lajuan Lines, MD  doxycycline (VIBRA-TABS) 100 MG tablet Take 1 tablet (100 mg total) by mouth 2 (two) times daily for 14 days. 10/21/20 11/04/20 Yes Pyrtle, Lajuan Lines, MD  ethacrynic acid (EDECRIN) 25 MG tablet Take 1 tablet (25 mg total) by mouth daily. 09/01/20  Yes Katsadouros, Vasilios, MD  ferrous sulfate 325 (65 FE) MG tablet Take 325 mg by mouth daily with breakfast.   Yes [provider]  isosorbide mononitrate (IMDUR) 30 MG 24 hr tablet Take 1 tablet (30 mg total) by mouth daily. 09/02/20  Yes Katsadouros, Vasilios, MD  metroNIDAZOLE (FLAGYL) 250 MG tablet Take 1 tablet (250 mg total) by mouth 2 (two) times daily for 14 days. 10/21/20 11/04/20 Yes Pyrtle, Lajuan Lines, MD  omeprazole (PRILOSEC) 40 MG capsule Take 1 capsule (40 mg total) by mouth 2 (two) times daily for 14 days. 10/21/20 11/04/20 Yes Pyrtle, Lajuan Lines, MD    I have reviewed the patient's current medications.  Labs:  Results for orders placed or performed during the hospital encounter of 10/28/20 (from the past 48 hour(s))  Urinalysis, Routine w reflex microscopic Urine, Clean Catch     Status: Abnormal   Collection Time: 10/28/20  6:17 AM  Result Value Ref Range   Color, Urine YELLOW YELLOW   APPearance CLEAR CLEAR   Specific Gravity, Urine 1.011 1.005 - 1.030   pH 6.0 5.0 - 8.0   Glucose, UA 50 (A) NEGATIVE mg/dL   Hgb urine dipstick SMALL (A) NEGATIVE   Bilirubin Urine NEGATIVE NEGATIVE   Ketones, ur NEGATIVE NEGATIVE mg/dL   Protein, ur >=300 (A) NEGATIVE mg/dL   Nitrite NEGATIVE NEGATIVE   Leukocytes,Ua  NEGATIVE NEGATIVE   RBC / HPF 0-5 0 - 5 RBC/hpf   WBC, UA 6-10 0 - 5 WBC/hpf   Bacteria, UA RARE (A) NONE SEEN    Comment: Performed at [facility], [address] [place], [place] [address]  Creatinine, urine, random     Status: None   Collection Time: 10/28/20  6:17 AM  Result Value Ref Range   Creatinine, Urine 83.24 mg/dL    Comment: Performed at [facility] [address] [place],  [address]  Protein / creatinine ratio, urine     Status: Abnormal   Collection Time: 10/28/20  6:17 AM  Result Value Ref Range   Creatinine, Urine 81.91 mg/dL   Total Protein, Urine 582 mg/dL    Comment: NO NORMAL RANGE ESTABLISHED FOR THIS TEST RESULTS CONFIRMED BY MANUAL DILUTION    Protein Creatinine Ratio 7.11 (H) 0.00 - 0.15 mg/mg[Cre]    Comment: Performed at [facility], [place] [address], [place], [place] [address]  Sodium, urine, random     Status: None   Collection Time: 10/28/20  6:17 AM  Result Value Ref Range   Sodium, Ur 73 mmol/L    Comment: Performed at [facility] [address] [place], [place] [address]  CBC with Differential     Status: Abnormal   Collection Time: 10/28/20  6:19 AM  Result Value Ref Range   WBC 6.6 4.0 - 10.5 K/uL   RBC 2.70 (L) 4.22 - 5.81 MIL/uL   Hemoglobin 7.1 (L) 13.0 - 17.0 g/dL   HCT 23.8 (L) 39.0 - 52.0 %   MCV 88.1 80.0 - 100.0 fL   MCH 26.3 26.0 - 34.0 pg   MCHC 29.8 (L) 30.0 - 36.0 g/dL   RDW 17.2 (H) 11.5 - 15.5 %   Platelets 208 150 - 400 K/uL   nRBC 0.0 0.0 - 0.2 %   Neutrophils Relative % 75 %   Neutro Abs 5.0 1.7 - 7.7 K/uL   Lymphocytes Relative 11 %   Lymphs Abs 0.8 0.7 - 4.0 K/uL   Monocytes Relative 10 %   Monocytes Absolute 0.7 0.1 - 1.0 K/uL   Eosinophils Relative 3 %   Eosinophils Absolute 0.2 0.0 - 0.5 K/uL   Basophils Relative 0 %   Basophils Absolute 0.0 0.0 - 0.1 K/uL   Immature Granulocytes 1 %   Abs Immature Granulocytes 0.03 0.00 - 0.07 K/uL    Comment: Performed at [facility] [address], [place], [place] [id number]  Basic metabolic panel     Status: Abnormal   Collection Time: 10/28/20  6:19 AM  Result Value Ref Range   Sodium 135 135 - 145 mmol/L   Potassium 3.8 3.5 - 5.1 mmol/L   Chloride 109 98 - 111 mmol/L   CO2 18 (L) 22 - 32 mmol/L   Glucose, Bld 106 (H) 70 - 99 mg/dL    Comment: Glucose reference range applies only to samples taken after fasting for at least 8 hours.   BUN 45 (H) 8 - 23 mg/dL   Creatinine, Ser 5.15 (H) 0.61 - 1.24 mg/dL   Calcium 7.7 (L) 8.9 - 10.3 mg/dL   GFR, Estimated 12 (L) >60 mL/min    Comment: (NOTE) Calculated using the CKD-EPI Creatinine Equation (2021)    Anion gap 8 5 - 15    Comment: Performed at [facility] [address] [place], [place] [address]  Brain natriuretic peptide     Status: Abnormal   Collection Time: 10/28/20  8:25 AM  Result Value Ref Range   B Natriuretic Peptide >4,500.0 (H) 0.0 - 100.0 pg/mL    Comment: Performed at [facility] [address], [place], [place] [address]  Troponin I (High Sensitivity)     Status: Abnormal   Collection Time: 10/28/20  8:25 AM  Result Value Ref Range   Troponin I (High Sensitivity) 51 (H) <18 ng/L    Comment: (NOTE) Elevated high sensitivity troponin I (hsTnI) values and significant  changes across serial measurements may suggest ACS but many other  chronic and acute conditions are known to elevate hsTnI results.  Refer to the "Links" section for chest pain algorithms and additional  guidance. Performed at [facility], [place] [address] [place], [place] [address]   Troponin I (High Sensitivity)     Status: Abnormal   Collection Time: 10/28/20 10:12 AM  Result Value Ref Range   Troponin I (High Sensitivity) 44 (H) <18 ng/L    Comment: (NOTE) Elevated high sensitivity troponin I (hsTnI) values and significant  changes across serial measurements may suggest ACS but many other  chronic and acute conditions are known to elevate  hsTnI results.  Refer to the Links section for chest pain algorithms and additional  guidance. Performed at [facility], [place] [address], [place], [place] [address]   Hepatic function panel     Status: Abnormal   Collection Time: 10/28/20 10:12 AM  Result Value Ref Range   Total Protein 6.9 6.5 - 8.1 g/dL   Albumin 2.4 (L) 3.5 - 5.0 g/dL   AST 30 15 - 41 U/L   ALT 21 0 - 44 U/L   Alkaline Phosphatase 84 38 - 126 U/L   Total Bilirubin 0.4 0.3 - 1.2 mg/dL   Bilirubin, Direct <0.1 0.0 - 0.2 mg/dL   Indirect Bilirubin NOT CALCULATED 0.3 - 0.9 mg/dL    Comment: Performed at [facility] [address], [place], [place] [address]  SARS CORONAVIRUS 2 (TAT 6-24 HRS) Nasopharyngeal Nasopharyngeal Swab     Status: None   Collection Time: 10/28/20  9:01 PM   Specimen: Nasopharyngeal Swab  Result Value Ref Range   SARS Coronavirus 2 NEGATIVE NEGATIVE    Comment: (NOTE) SARS-CoV-2 target nucleic acids are NOT DETECTED.  The SARS-CoV-2 RNA is generally detectable in upper and lower respiratory specimens during the acute phase of infection. Negative results do not preclude SARS-CoV-2 infection, do not rule out co-infections with other pathogens, and should not be used as the sole basis for treatment or other patient management decisions. Negative results must be combined with clinical observations, patient history, and epidemiological information. The expected result is Negative.  Fact Sheet for Patients: SugarRoll.be  Fact Sheet for Healthcare Providers: https://www.woods-mathews.com/  This test is not yet approved or cleared by the [place] FDA and  has been authorized for detection and/or diagnosis of SARS-CoV-2 by FDA under an Emergency Use Authorization (EUA). This EUA will remain  in effect (meaning this test can be used) for the duration of the COVID-19 declaration under Se ction 564(b)(1) of the Act, 21 U.S.C. section  360bbb-3(b)(1), unless the authorization is terminated or revoked sooner.  Performed at [facility], [place] [address], [place], [place] [address]   Magnesium     Status: None   Collection Time: 10/28/20  9:33 PM  Result Value Ref Range   Magnesium 1.8 1.7 - 2.4 mg/dL    Comment: Performed at [facility] [address] [place], [place] [address]  Hemoglobin A1c     Status: None   Collection Time: 10/28/20  9:33 PM  Result Value Ref Range   Hgb A1c MFr Bld 5.3 4.8 - 5.6 %    Comment: (NOTE) Pre diabetes:          5.7%-6.4%  Diabetes:              >6.4%  Glycemic control for   <7.0% adults with diabetes    Mean Plasma Glucose 105.41 mg/dL    Comment: Performed at [facility] [address] [place], [place] [address]  Comprehensive metabolic panel     Status: Abnormal   Collection Time: 10/29/20  2:48 AM  Result Value Ref Range   Sodium 138 135 - 145 mmol/L   Potassium 4.3 3.5 - 5.1 mmol/L   Chloride  111 98 - 111 mmol/L   CO2 20 (L) 22 - 32 mmol/L   Glucose, Bld 129 (H) 70 - 99 mg/dL    Comment: Glucose reference range applies only to samples taken after fasting for at least 8 hours.   BUN 45 (H) 8 - 23 mg/dL   Creatinine, Ser 5.40 (H) 0.61 - 1.24 mg/dL   Calcium 7.5 (L) 8.9 - 10.3 mg/dL   Total Protein 6.0 (L) 6.5 - 8.1 g/dL   Albumin 2.0 (L) 3.5 - 5.0 g/dL   AST 21 15 - 41 U/L   ALT 17 0 - 44 U/L   Alkaline Phosphatase 66 38 - 126 U/L   Total Bilirubin 0.4 0.3 - 1.2 mg/dL   GFR, Estimated 11 (L) >60 mL/min    Comment: (NOTE) Calculated using the CKD-EPI Creatinine Equation (2021)    Anion gap 7 5 - 15    Comment: Performed at [facility], [place] [address], [place], [place] [address]  CBC with Differential/Platelet     Status: Abnormal   Collection Time: 10/29/20  2:48 AM  Result Value Ref Range   WBC 5.2 4.0 - 10.5 K/uL   RBC 2.45 (L) 4.22 - 5.81 MIL/uL   Hemoglobin 6.5 (LL) 13.0 - 17.0 g/dL    Comment: REPEATED TO VERIFY THIS CRITICAL  RESULT HAS VERIFIED AND BEEN CALLED TO RN GEORGE NATALE BY MESSAN HOUEGNIFIO ON 05 21 2022 AT 0350, AND HAS BEEN READ BACK.     HCT 21.0 (L) 39.0 - 52.0 %   MCV 85.7 80.0 - 100.0 fL   MCH 26.5 26.0 - 34.0 pg   MCHC 31.0 30.0 - 36.0 g/dL   RDW 17.1 (H) 11.5 - 15.5 %   Platelets 181 150 - 400 K/uL   nRBC 0.0 0.0 - 0.2 %   Neutrophils Relative % 74 %   Neutro Abs 3.8 1.7 - 7.7 K/uL   Lymphocytes Relative 11 %   Lymphs Abs 0.6 (L) 0.7 - 4.0 K/uL   Monocytes Relative 11 %   Monocytes Absolute 0.6 0.1 - 1.0 K/uL   Eosinophils Relative 4 %   Eosinophils Absolute 0.2 0.0 - 0.5 K/uL   Basophils Relative 0 %   Basophils Absolute 0.0 0.0 - 0.1 K/uL   Immature Granulocytes 0 %   Abs Immature Granulocytes 0.01 0.00 - 0.07 K/uL    Comment: Performed at [facility] [address] [place], [place] [address]  Type and screen [place]     Status: None (Preliminary result)   Collection Time: 10/29/20  5:05 AM  Result Value Ref Range   ABO/RH(D) O POS    Antibody Screen NEG    Sample Expiration 11/01/2020,2359    Unit Number [id number]    Blood Component Type RBC LR PHER1    Unit division 00    Status of Unit ALLOCATED    Transfusion Status OK TO TRANSFUSE    Crossmatch Result      Compatible Performed at [facility], [place] [address] [place], [place] [address]   Prepare RBC (crossmatch)     Status: None   Collection Time: 10/29/20  7:22 AM  Result Value Ref Range   Order Confirmation      ORDER PROCESSED BY BLOOD BANK Performed at [facility], [address] [place], [place] [address]      ROS:  Pertinent items noted in HPI and remainder of comprehensive ROS otherwise negative.  Physical Exam: Vitals:   10/29/20 0439 10/29/20 0750  BP: (!) 157/75 (!) 174/89  Pulse: 81 84  Resp: 18 18  Temp: 97.9 F (36.6 C) 98.2 F (36.8 C)  SpO2: 97% 100%     General exam: Appears calm and comfortable  Respiratory system: Clear to  auscultation. Respiratory effort normal. No wheezing or crackle Cardiovascular system: S1 & S2 heard, RRR, no rub.  Trace lower extremity edema. Gastrointestinal system: Abdomen is nondistended, soft and nontender. Normal bowel sounds heard. Central nervous system: Alert and oriented. No focal neurological deficits. Extremities: Trace edema. Skin: No rashes, lesions or ulcers Psychiatry: Judgement and insight appear normal. Mood & affect appropriate.  Assessment/Plan:  #Acute kidney injury on CKD IV/V, nonoliguric vs progressive CKD stage V presumably due to uncontrolled hypertension concomitant with diastolic CHF.  Urinalysis with nephrotic range proteinuria without microscopic hematuria.  His last A1c was 5.3.  The kidney ultrasound with chronic finding. Responding well with IV diuretics with around 5.3 L of urine output since admission.  He looks comfortable and edema seems to be improving.  The creatinine level up trended to 5.40 therefore I will continue IV Lasix today and switch to oral Lasix from tomorrow. Because of nephrotic range proteinuria, I will send basic lab test including free light chain, hep B, hep C, HIV, ANA.  SPEP with no M spike. However given chronicity, I do not think he will benefit from kidney biopsy at this point.  He will need close follow-up with [place] for the preparation of dialysis.  Fortunately, he is responding well with the medication and has no uremic features to warrant dialysis at this time. I will order vein mapping.  #Acute on chronic diastolic CHF: Diuretics as above.  May benefit from cardiology follow-up.  #Hypertensive urgency/fluid overload.  Blood pressure is improving but is still elevated.  Diuretics to manage volume.  Continue amlodipine, carvedilol, hydralazine, Imdur.  #Anemia: Receiving blood transfusion today.  Iron saturation 8% therefore I will order IV iron and start ESA.  #CKD MBD: Check PTH and phosphorus level.  May benefit from  binders.  Thank you for the consult.  We will follow with you.  Memory Heinrichs Tanna Furry 10/29/2020, 8:40 AM  [employer].

## 2020-10-29 NOTE — Progress Notes (Incomplete)
  Echocardiogram 2D Echocardiogram has been performed.  Rodell Marrs  Lynnette Caffey 10/29/2020, 2:45 PM

## 2020-10-29 NOTE — Progress Notes (Signed)
Subjective:  [name]  No acute overnight events. This morning, patient reports feeling well. His breathing is improved and he is able to lay flat. He reports urinating a lot since being in the hospital and has had significant improvement in his swelling. No dark stools or bloody stools recently.   Objective:  Vital signs in last 24 hours: Vitals:   10/28/20 2053 10/28/20 2056 10/29/20 0014 10/29/20 0439  BP:  (!) 172/85 (!) 171/87 (!) 157/75  Pulse:  79 88 81  Resp:  '19 19 18  '$ Temp:  98.1 F (36.7 C) 98.2 F (36.8 C) 97.9 F (36.6 C)  TempSrc:  Oral Oral Oral  SpO2:  99% 98% 97%  Weight: 82 kg   80.6 kg  Height: '5\' 7"'$  (1.702 m)      Physical Exam: General: Laying in bed comfortably, no acute distress CV: Regular rate, rhythm. No murmurs, rales, gallops appreciated Pulm: Mild bibasilar rales present. Normal work of breathing no room air. Abdomen: Soft, non-tender, non-distended. Normoactive bowel sounds. MSK: Trace pitting edema bilaterally. Normal bulk, tone.  CBC Latest Ref Rng & Units 10/29/2020 10/28/2020 09/01/2020  WBC 4.0 - 10.5 K/uL 5.2 6.6 7.1  Hemoglobin 13.0 - 17.0 g/dL 6.5(LL) 7.1(L) 8.6(L)  Hematocrit 39.0 - 52.0 % 21.0(L) 23.8(L) 26.6(L)  Platelets 150 - 400 K/uL 181 208 249   BMP Latest Ref Rng & Units 10/29/2020 10/28/2020 09/01/2020  Glucose 70 - 99 mg/dL 129(H) 106(H) 117(H)  BUN 8 - 23 mg/dL 45(H) 45(H) 58(H)  Creatinine 0.61 - 1.24 mg/dL 5.40(H) 5.15(H) 5.16(H)  Sodium 135 - 145 mmol/L 138 135 136  Potassium 3.5 - 5.1 mmol/L 4.3 3.8 5.0  Chloride 98 - 111 mmol/L 111 109 113(H)  CO2 22 - 32 mmol/L 20(L) 18(L) 16(L)  Calcium 8.9 - 10.3 mg/dL 7.5(L) 7.7(L) 7.4(L)   Assessment/Plan: Jeffrey Zuniga is 65yo person living with uncontrolled hypertension, pre-diabetes, CKDIV, HFmrEF, iron deficiency anemia, H. Pylori gastritis admitted 5/20 for acute on chronic heart failure with worsening renal function.  Principal Problem:   Acute heart failure with reduced  ejection fraction and diastolic dysfunction (HCC) Active Problems:   Acute kidney injury ([place])   Iron deficiency anemia   Severe asymptomatic hypertension   Helicobacter pylori gastritis  #Acute on chronic combined systolic and diastolic heart failure Since admission patient has remained hemodynamically stable. Patient has had excellent response to diuresis with urine output of >5L so far. On exam this morning patient does not appear grossly hypervolemic, with only mild edema and bibasilar rales. Will give one more day of IV lasix, likely will be able to transition to oral tomorrow morning. Previously there was a concern regarding amyloidosis. Once patient euvolemic can consider cardiac MRI for further evaluation. - IV lasix '60mg'$  BID - Daily BMP, Mg - Strict I/O - Daily weights - Pending TTE - Consider cardiac MRI - TOC consult  #Acute on chronic kidney disease stage IV #Proteinuria Despite large diuresis since arrival patient's creatinine increased from yesterday, 5.4<5.1. Discussed with nephrology, who agrees to see. Most likely this is secondary to patient's long-standing uncontrolled hypertension in setting of combined systolic and diastolic heart failure. However, given there is a concern for cardiac amyloidosis can consider renal amyloid disease. Per nephrology will not benefit from biopsy, and will likely need preparation for dialysis in the future, though no current indication. - Daily BMP - Light chains, ANA per nephrology - Vein mapping ordered - Strict I/O - Avoid nephrotoxins  #Severe asymptomatic hypertension Blood  pressures have improved since arrival, down to systolic 99991111 this morning. Will continue with home medications, will be careful not to drop pressure too quickly. Goal SBP 160-170. - Amlodipine '10mg'$  daily - Carvedilol 6.'25mg'$  BID - Isosorbide mononitrate '30mg'$  daily - IV lasix '60mg'$  BID  #Acute on chronic normocytic anemia #Iron deficiency anemia Hgb 6.5 this  AM. No signs of bleeding on exam. Will transfuse one unit pRBC's. Could benefit from IV iron infusion while inpatient. - Transfuse 1u pRBC - Transfuse <7 - F/u post-transfusion H/H - Daily CBC  #H. pylori gastritis Patient free of abdominal pain, tolerating po well. Will continue with treatment x14d - Doxycycline '100mg'$  BID - Metronidazole '250mg'$  BID - Pantoprazole '40mg'$  qd  #Hx pre-diabetes A1c 5.3% on arrival. Will monitor CBG's as needed.  DIET: HH IVF: n/a DVT PPX: Lovenox BOWEL: n/a CODE: FULL FAM COM: n/a  Prior to Admission Living Arrangement: Home Anticipated Discharge Location: Home Barriers to Discharge: medical management Dispo: Anticipated discharge in approximately 1-2 day(s).   Sanjuan Dame, MD 10/29/2020, 7:49 AM Pager: [phone] After 5pm on weekdays and 1pm on weekends: On Call pager [phone]

## 2020-10-29 NOTE — Progress Notes (Signed)
1u PRBC given, transfusion complete. No adverse reaction noted.

## 2020-10-30 LAB — CBC
HCT: 23.7 % — ABNORMAL LOW (ref 39.0–52.0)
Hemoglobin: 7.4 g/dL — ABNORMAL LOW (ref 13.0–17.0)
MCH: 27.2 pg (ref 26.0–34.0)
MCHC: 31.2 g/dL (ref 30.0–36.0)
MCV: 87.1 fL (ref 80.0–100.0)
Platelets: 164 10*3/uL (ref 150–400)
RBC: 2.72 MIL/uL — ABNORMAL LOW (ref 4.22–5.81)
RDW: 16.8 % — ABNORMAL HIGH (ref 11.5–15.5)
WBC: 5.8 10*3/uL (ref 4.0–10.5)
nRBC: 0 % (ref 0.0–0.2)

## 2020-10-30 LAB — BPAM RBC
Blood Product Expiration Date: 202206202359
ISSUE DATE / TIME: 202205210951
Unit Type and Rh: 5100

## 2020-10-30 LAB — TYPE AND SCREEN
ABO/RH(D): O POS
Antibody Screen: NEGATIVE
Unit division: 0

## 2020-10-30 LAB — BASIC METABOLIC PANEL
Anion gap: 7 (ref 5–15)
BUN: 48 mg/dL — ABNORMAL HIGH (ref 8–23)
CO2: 19 mmol/L — ABNORMAL LOW (ref 22–32)
Calcium: 7.6 mg/dL — ABNORMAL LOW (ref 8.9–10.3)
Chloride: 111 mmol/L (ref 98–111)
Creatinine, Ser: 5.16 mg/dL — ABNORMAL HIGH (ref 0.61–1.24)
GFR, Estimated: 12 mL/min — ABNORMAL LOW (ref >60)
Glucose, Bld: 112 mg/dL — ABNORMAL HIGH (ref 70–99)
Potassium: 4.2 mmol/L (ref 3.5–5.1)
Sodium: 137 mmol/L (ref 135–145)

## 2020-10-30 LAB — PTH, INTACT AND CALCIUM
Calcium, Total (PTH): 7.3 mg/dL — ABNORMAL LOW (ref 8.6–10.2)
PTH: 174 pg/mL — ABNORMAL HIGH (ref 15–65)

## 2020-10-30 LAB — MAGNESIUM: Magnesium: 1.8 mg/dL (ref 1.7–2.4)

## 2020-10-30 MED ORDER — CARVEDILOL 12.5 MG PO TABS: 12.5000 mg | ORAL_TABLET | Freq: Two times a day (BID) | ORAL | 0 refills | Status: DC

## 2020-10-30 MED ORDER — ISOSORBIDE MONONITRATE ER 30 MG PO TB24: 30.0000 mg | ORAL_TABLET | Freq: Every day | ORAL | 0 refills | Status: DC

## 2020-10-30 MED ORDER — AMLODIPINE BESYLATE 10 MG PO TABS: 10.0000 mg | ORAL_TABLET | Freq: Every day | ORAL | 0 refills | Status: DC

## 2020-10-30 MED ORDER — FERROUS SULFATE 325 (65 FE) MG PO TABS: 325.0000 mg | ORAL_TABLET | Freq: Every day | ORAL | 3 refills | Status: DC

## 2020-10-30 MED ORDER — DOXYCYCLINE HYCLATE 100 MG PO TABS: 100.0000 mg | ORAL_TABLET | Freq: Two times a day (BID) | ORAL | 0 refills | Status: AC

## 2020-10-30 MED ORDER — PANTOPRAZOLE SODIUM 40 MG PO TBEC: 40.0000 mg | DELAYED_RELEASE_TABLET | Freq: Every day | ORAL | 0 refills | Status: DC

## 2020-10-30 MED ORDER — METRONIDAZOLE 250 MG PO TABS: 250.0000 mg | ORAL_TABLET | Freq: Two times a day (BID) | ORAL | 0 refills | Status: AC

## 2020-10-30 MED ORDER — FUROSEMIDE 40 MG PO TABS: 40.0000 mg | ORAL_TABLET | Freq: Two times a day (BID) | ORAL | 0 refills | Status: DC

## 2020-10-30 NOTE — TOC Transition Note (Signed)
Transition of Care [facility]) - CM/SW Discharge Note   Patient Details  Name: Jeffrey Zuniga MRN: [id number] Date of Birth: [birth year]  [facility] CM/SW Contact:  Pollie Friar, RN Phone Number: 10/30/2020, 12:08 PM   Clinical Narrative:    Patient is discharging home with self care. No needs per TOC.   Final next level of care: Home/Self Care Barriers to Discharge: No Barriers Identified   Patient Goals and CMS Choice        Discharge Placement                       Discharge Plan and Services                                     Social Determinants of Health (SDOH) Interventions     Readmission Risk Interventions No flowsheet data found.

## 2020-10-30 NOTE — Discharge Summary (Signed)
Name: Jeffrey Zuniga MRN: [id number] DOB: Jul 30, 1955 65 y.o. PCP: Patient, No Pcp Per (Inactive)  Date of Admission: 10/28/2020  6:09 AM Date of Discharge: 10/30/2020 Attending Physician: Campbell Riches, MD  Discharge Diagnosis: 1. Acute on chronic combined systolic and diastolic heart failure 2. Severe asymptomatic hypertension 3. Acute on chronic kidney failure 4. Acute on chronic normocytic anemia 5. H. Pylori gastritis  Discharge Medications: Allergies as of 10/30/2020   No Known Allergies     Medication List    STOP taking these medications   ethacrynic acid 25 MG tablet Commonly known as: EDECRIN   omeprazole 40 MG capsule Commonly known as: PRILOSEC Replaced by: pantoprazole 40 MG tablet     TAKE these medications   amLODipine 10 MG tablet Commonly known as: NORVASC Take 1 tablet (10 mg total) by mouth daily.   bismuth subsalicylate 99991111 MG chewable tablet Commonly known as: Pepto-Bismol Chew 2 tablets (524 mg total) by mouth 4 (four) times daily for 14 days.   carvedilol 12.5 MG tablet Commonly known as: COREG Take 1 tablet (12.5 mg total) by mouth 2 (two) times daily with a meal. What changed:   medication strength  how much to take   doxycycline 100 MG tablet Commonly known as: VIBRA-TABS Take 1 tablet (100 mg total) by mouth 2 (two) times daily for 11 days.   ferrous sulfate 325 (65 FE) MG tablet Take 1 tablet (325 mg total) by mouth daily with breakfast.   furosemide 40 MG tablet Commonly known as: LASIX Take 1 tablet (40 mg total) by mouth 2 (two) times daily.   isosorbide mononitrate 30 MG 24 hr tablet Commonly known as: IMDUR Take 1 tablet (30 mg total) by mouth daily.   metroNIDAZOLE 250 MG tablet Commonly known as: FLAGYL Take 1 tablet (250 mg total) by mouth 2 (two) times daily for 11 days.   pantoprazole 40 MG tablet Commonly known as: PROTONIX Take 1 tablet (40 mg total) by mouth daily. Replaces: omeprazole 40 MG capsule    Talicia 99991111 MG Cpdr Generic drug: Amoxicill-Rifabutin-Omeprazole Take 4 tablets by mouth every 8 (eight) hours.      Disposition and follow-up:   Jeffrey Zuniga was discharged from [facility] in Stable condition.  At the hospital follow up visit please address:  1. Acute on chronic combined systolic and diastolic heart failure: Please ensure patient is taking his medications and is euvolemic. Multiple conversations in the hospital regarding compliance and close follow-up, appears he is motivated to get control of his health on day of discharge.  2. Severe asymptomatic hypertension: At discharge continued to be mildly hypertensive given initial SBP>200. Will likely need further titration of meds for better control.  3. Acute on chronic kidney failure: Patient to follow-up with nephrology. Most likely baseline now sCr ~5.1 w/ GFR ~10. Likely will need dialysis w/in the next year.  4. Acute on chronic normocytic anemia: Patient needs to be taking his iron supplements, possibly ESA w/ nephrology.  5. H. Pylori gastritis: Patient started on triple therapy 5/20, he should be taking this for 14 days.  2.  Labs / imaging needed at time of follow-up: CBC, BMP  3.  Pending labs/ test needing follow-up: n/a  Follow-up Appointments:    Follow-up Information    [facility] [place]. Schedule an appointment as soon as possible for a visit in 1 week(s).   Contact information: [address] [place] [address] [phone]        [facility]  [place]. Schedule an appointment as soon as possible for a visit in 1 week(s).   Why: Our office should call you on Monday 5/23 to set up an appointment. If you do not hear from them on Monday, please call the clinic on Tuesday to set up an appointment later this week. Contact information: [address] [place] [facility] Course by problem list: 1. Acute on  chronic combined systolic and diastolic heart failure: Patient arrived on 5/20 with anasarca, orthopnea, and dyspnea on exertion. On arrival to ED BNP >4500, severely hypertensive requiring supplemental oxygen. Patient was started on IV diuretics with good response. Over the first 24 hours patient had >5L urine output and had marked improvement of symptoms. Patient continued to diurese over the next day, appearing euvolemic on day of discharge. Of note, previously thought etiology of patient's heart failure could possibly be 2/2 amyloidosis after initial TTE in March 2022. Echo during this hospitalization revealed diastolic dysfunction without further characteristics of amyloidosis. Most likely patient's heart failure is due to his chronic uncontrolled hypertension. Had multiple conversations with patient regarding compliance and importance of continued follow-up. Patient appears to be motivated to improve his overall health.  2. Severe asymptomatic hypertension: On arrival to ED, systolic blood pressures 99991111 in setting of medication non-compliance. Over the next day we were slowly able to lower his blood pressure in order to not drop too quickly. We re-started much of his home medications, to which he had a good response. Patient's SBP remained 160-170. Will need further titration in the outpatient setting but wanted to drop slowly.  3. Acute on chronic kidney failure: Nephrology consulted on first day of admission given renal function worsened w/ GFR 11. At that time it was determined patient's baseline renal function is likely CKDV w/ sCr ~5.1. Multiple conversations were had between the patient and nephrology regarding prognosis in the future. Most likely patient will need dialysis within the next six/twelve months. Patient to follow-up with nephrology for further discussions and evaluation.  4. Acute on chronic normocytic anemia: Patient does have long term anemia, mostly due to his advanced chronic  kidney disease. Patient also has iron deficiency anemia, for which he is supposed to take iron supplements. On day 1 of hospitalization, Hgb <7 and was subsequently transfused. There was an appropriate response to this the next day and remained stable through discharge.   5. H. Pylori gastritis: Patient previously diagnosed with H. Pylori gastritis during last hospitalization. He had not started on appropriate therapy at that time. On May 20th patient was initiated on triple therapy. Patient should remain on this for 14 days total.  Discharge Exam:   BP (!) 181/84 (BP Location: Left Arm)   Pulse 72   Temp 98.4 F (36.9 C) (Oral)   Resp 20   Ht '5\' 7"'$  (1.702 m)   Wt 80.7 kg   SpO2 99%   BMI 27.88 kg/m  General: Resting comfortably in bed, no acute distress HENT: Normocephalic, atraumatic. CV: Regular rate, rhythm. No murmurs, rubs, gallops. Distal pulses 2+ bilaterally. Pulm: Normal work of breathing on room air. MSK: Normal bulk, tone. No pitting edema bilaterally Skin: Multiple dark, rough patches on extensor surfaces of bilateral legs, arms, abdomen Neuro: Awake, alert, answering questions appropriately.  Pertinent Labs, Studies, and Procedures:  CBC Latest Ref Rng & Units 10/30/2020 10/29/2020 10/29/2020  WBC 4.0 - 10.5 K/uL  5.8 - 5.2  Hemoglobin 13.0 - 17.0 g/dL 7.4(L) 7.6(L) 6.5(LL)  Hematocrit 39.0 - 52.0 % 23.7(L) 24.6(L) 21.0(L)  Platelets 150 - 400 K/uL 164 - 181   BMP Latest Ref Rng & Units 10/30/2020 10/29/2020 10/29/2020  Glucose 70 - 99 mg/dL 112(H) 129(H) -  BUN 8 - 23 mg/dL 48(H) 45(H) -  Creatinine 0.61 - 1.24 mg/dL 5.16(H) 5.40(H) -  Sodium 135 - 145 mmol/L 137 138 -  Potassium 3.5 - 5.1 mmol/L 4.2 4.3 -  Chloride 98 - 111 mmol/L 111 111 -  CO2 22 - 32 mmol/L 19(L) 20(L) -  Calcium 8.9 - 10.3 mg/dL 7.6(L) 7.3(L) 7.5(L)   TTE 5/21: LVEF 65-70%. Normal LV function. No RWMAs. Severe LVH. Grade II diastolic dysfunction. Moderately elevated pulmonary artery systolic  pressure. Moderately dilated LA. Severely dilated RA.  Discharge Instructions:  Jeffrey Zuniga,  I am so glad you are feeling better! You were admitted because your blood pressure was severely high, which caused strain on your heart. Because of this, you had fluid built up in your body that caused your symptoms. Thankfully we were able to get the fluid off. Moving forward, it is going to be very important for you to take your medications as prescribed and follow-up with your doctors.  For your heart failure and blood pressure, you will be taking:  Amlodipine (NORVASC) '10mg'$  daily Carvedilol (COREG) 12.'5mg'$  twice daily Furosemide (LASIX) '40mg'$  twice daily Isosorbide mononitrate (IMDUR) '30mg'$  daily  You should continue taking your iron supplement as well.  It will also be important for you to continue taking the following medications for your stomach: Doxycycline '100mg'$  twice daily Metronidazole '250mg'$  twice daily Pantoprazole '40mg'$  twice daily  It is very important for you to follow up with the kidney doctors at [facility].  In addition, our team can provide primary care at our clinic in the hospital. Unfortunately we cannot schedule an appointment over the weekend, but I have sent the front desk your information and they will give you a call to set up an appointment later this week.  It was a pleasure meeting you, Jeffrey Zuniga. I wish you the best and hope you stay happy and healthy!  Thank you, Sanjuan Dame, MD  Signed: Sanjuan Dame, MD 10/30/2020, 11:53 AM   Pager: [phone]

## 2020-10-30 NOTE — Progress Notes (Signed)
Nephrology Follow-Up Consult note   Assessment/Recommendations: Jeffrey Zuniga is a/an 65 y.o. male with a past medical history significant for type II DM, hypertension, GI bleed, CKD stage IV/V admitted with CHF exacerbation  CKD 5: Minimal AKI at this time.  I think his creatinine baseline is around 5 which is his current level.  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  Given his significant proteinuria a small serological w/u is pending but given advanced CKD unlikely to benefit from immunosuppression.  -Continue Lasix per primary team -Started discussions regarding dialysis.  Patient is hesitant as this is a big life change.  We discussed that he is likely to need dialysis within the next 6 to 12 months.  He wants to see [place] in clinic before proceeding with any form of access placement as this is very new to him.  He would be motivated to do dialysis at home.  He has been referred to [place] in the past and will call and get rescheduled. -Follow-up serologies -Continue to monitor daily Cr, Dose meds for GFR -Monitor Daily I/Os, Daily weight  -Maintain MAP>65 for optimal renal perfusion.  -Avoid nephrotoxic medications including NSAIDs and Vanc/Zosyn combo -Currently no indication for HD  Heart failure exacerbation: Continue diuretics as prescribed.  Volume status improved.  Good urine output yesterday  Hypertension: Overall improving.  Continue medications as prescribed.  Anemia due to renal failure and iron deficiency: Iron and ESA ordered  Metabolic acidosis: Bicarb 19.  Continue to monitor for now.  CKD MBD: Follow-up PTH.  Phosphorus 4.5.  Calcium 7.6 but corrects to normal  Hepatitis C antibody positive: Follow-up PCR   Recommendations conveyed to primary service.    [facility] 10/30/2020 9:16 AM  ___________________________________________________________  CC: CKD  V  Interval History/Subjective: States he feels pretty well today.  He states that this hospitalization has been a wake-up call.  He wants to be more active in his care.  He wants to avoid dialysis.  We discussed that dialysis is likely inevitable for him given his advanced renal failure and unlikely to have significant improvement.   Medications:  Current Facility-Administered Medications  Medication Dose Route Frequency Provider Last Rate Last Admin  . 0.9 %  sodium chloride infusion (Manually program via Guardrails IV Fluids)   Intravenous Once Christian, Rylee, MD      . 0.9 %  sodium chloride infusion  250 mL Intravenous PRN Agyei, Obed K, MD      . acetaminophen (TYLENOL) tablet 650 mg  650 mg Oral Q4H PRN Jean Rosenthal, MD   650 mg at 10/29/20 2143  . amLODipine (NORVASC) tablet 10 mg  10 mg Oral Daily Jean Rosenthal, MD   10 mg at 10/29/20 0851  . carvedilol (COREG) tablet 12.5 mg  12.5 mg Oral BID WC Aslam, Sadia, MD   12.5 mg at 10/30/20 0810  . doxycycline (VIBRA-TABS) tablet 100 mg  100 mg Oral BID Jean Rosenthal, MD   100 mg at 10/29/20 2122  . ferric gluconate (FERRLECIT) 250 mg in sodium chloride 0.9 % 100 mL IVPB  250 mg Intravenous Daily Rosita Fire, MD 120 mL/hr at 10/29/20 1450 250 mg at 10/29/20 1450  . ferrous sulfate tablet 325 mg  325 mg Oral Q breakfast Agyei, Obed K, MD   325 mg at 10/30/20 0810  . furosemide (LASIX) tablet 40 mg  40 mg Oral BID Rosita Fire, MD  40 mg at 10/30/20 0810  . hydrALAZINE (APRESOLINE) injection 10 mg  10 mg Intravenous Q4H PRN Jean Rosenthal, MD   10 mg at 10/29/20 0410  . isosorbide mononitrate (IMDUR) 24 hr tablet 30 mg  30 mg Oral Daily Agyei, Obed K, MD   30 mg at 10/29/20 0851  . metroNIDAZOLE (FLAGYL) tablet 250 mg  250 mg Oral BID Jean Rosenthal, MD   250 mg at 10/29/20 2121  . ondansetron (ZOFRAN) injection 4 mg  4 mg Intravenous Q6H PRN Agyei, Obed K, MD      . pantoprazole (PROTONIX) EC tablet 40 mg  40 mg Oral Daily  Agyei, Obed K, MD   40 mg at 10/29/20 0851  . pneumococcal 23 valent vaccine (PNEUMOVAX-23) injection 0.5 mL  0.5 mL Intramuscular Tomorrow-1000 Bobby Rumpf C, MD      . sodium chloride flush (NS) 0.9 % injection 3 mL  3 mL Intravenous Q12H Jean Rosenthal, MD   3 mL at 10/29/20 2122  . sodium chloride flush (NS) 0.9 % injection 3 mL  3 mL Intravenous PRN Jean Rosenthal, MD          Review of Systems: 10 systems reviewed and negative except per interval history/subjective  Physical Exam: Vitals:   10/30/20 0336 10/30/20 0824  BP: (!) 167/80 (!) 181/84  Pulse: 81 72  Resp: 19 20  Temp: 98.3 F (36.8 C) 98.4 F (36.9 C)  SpO2: 99% 99%   No intake/output data recorded.  Intake/Output Summary (Last 24 hours) at 10/30/2020 [id number] Last data filed at 10/30/2020 [id number] Gross per 24 hour  Intake 1370 ml  Output 2195 ml  Net -825 ml   Constitutional: well-appearing, no acute distress ENMT: ears and nose without scars or lesions, MMM CV: normal rate, trace edema in the bilateral lower extremities, lying flat in bed Respiratory: Bilateral chest rise, normal work of breathing Gastrointestinal: soft, non-tender, no palpable masses or hernias Skin: no visible lesions or rashes Psych: alert, judgement/insight appropriate, appropriate mood and affect   Test Results I personally reviewed new and old clinical labs and radiology tests Lab Results  Component Value Date   NA 137 10/30/2020   K 4.2 10/30/2020   CL 111 10/30/2020   CO2 19 (L) 10/30/2020   BUN 48 (H) 10/30/2020   CREATININE 5.16 (H) 10/30/2020   CALCIUM 7.6 (L) 10/30/2020   ALBUMIN 2.0 (L) 10/29/2020   PHOS 4.5 10/29/2020

## 2020-10-30 NOTE — Progress Notes (Signed)
Pt is alert and oriented X4. Able to communicate needs. He has been educated on medication adherence and the need to follow-up with primary physician outpatient. He agreed with the plan and verbalized undestanding

## 2020-10-30 NOTE — Progress Notes (Signed)
Subjective:  [name]  No acute overnight events. [name] reports feeling well this morning. He reports significant improvement in his breathing since admission and also improved from yesterday. No further abdominal pain and reports having a bowel movement this morning. Good appetite. He would like to establish care with PCP to make sure he does not run out of his medications any more and does not end up back in the hospital. He would also like to follow up with [facility] as outpatient.   Objective:  Vital signs in last 24 hours: Vitals:   10/29/20 1304 10/29/20 1956 10/30/20 0030 10/30/20 0336  BP: (!) 148/76 (!) 154/68 (!) 150/71 (!) 167/80  Pulse: 75 80 77 81  Resp: '16 18 19 19  '$ Temp: 98 F (36.7 C) 98.3 F (36.8 C) 98.6 F (37 C) 98.3 F (36.8 C)  TempSrc: Oral Oral Oral Oral  SpO2: 95% 97% 99% 99%  Weight:   80.7 kg   Height:       Physical Exam: General: Resting comfortably in bed in no acute distress CV: Regular rate, rhythm. Distal pulses 2+ bilaterally. No JVD Pulm: Normal work of breathing on room air. Clear to auscultation bilaterally. MSK: Normal bulk, tone. No pitting edema bilaterally Skin: Multiple dark, rough patches of extensor surfaces of bilateral legs, arms, and on abdomen. No purulence or drainage present.  CBC Latest Ref Rng & Units 10/30/2020 10/29/2020 10/29/2020  WBC 4.0 - 10.5 K/uL 5.8 - 5.2  Hemoglobin 13.0 - 17.0 g/dL 7.4(L) 7.6(L) 6.5(LL)  Hematocrit 39.0 - 52.0 % 23.7(L) 24.6(L) 21.0(L)  Platelets 150 - 400 K/uL 164 - 181   BMP Latest Ref Rng & Units 10/30/2020 10/29/2020 10/28/2020  Glucose 70 - 99 mg/dL 112(H) 129(H) 106(H)  BUN 8 - 23 mg/dL 48(H) 45(H) 45(H)  Creatinine 0.61 - 1.24 mg/dL 5.16(H) 5.40(H) 5.15(H)  Sodium 135 - 145 mmol/L 137 138 135  Potassium 3.5 - 5.1 mmol/L 4.2 4.3 3.8  Chloride 98 - 111 mmol/L 111 111 109  CO2 22 - 32 mmol/L 19(L) 20(L) 18(L)  Calcium 8.9 - 10.3 mg/dL 7.6(L) 7.5(L) 7.7(L)   Assessment/Plan: Jeffrey Zuniga is  65yo person living with uncontrolled hypertension, pre-diabetes, CKD IV, HFmrEF, iron deficiency anemia, H. Pylori gastritis admitted 5/20 for acute on chronic heart failure, appears euvolemic and stable for discharge.  Principal Problem:   Acute heart failure with reduced ejection fraction and diastolic dysfunction (HCC) Active Problems:   Acute kidney injury ([place])   Iron deficiency anemia   Severe asymptomatic hypertension   Helicobacter pylori gastritis  #Acute on chronic combined systolic and diastolic heart failure Hemodynamically stable, continued to diurese well over last 24 hours. Reported 2L urine output w/ net negative 1L. On exam this morning appears to be euvolemic, symptoms markedly improved from admission. TTE yesterday with normal EF, no RWMAs, severe LVH, grade II diastolic dysfunction, moderate pulmonary artery pressure, and severe dilation of left and right atria. Findings less likely Discussed with patient importance of medication adherence. Discussed following with [facility] for his PCP needs, patient agrees. Will have our front desk call him in the morning for a follow-up appointment within the next week. Appreciate TOC consult. - PO lasix '40mg'$  BID - Follow-up BMP, magnesium - Strict I/O - Daily weights  #Chronic kidney disease stage V Renal function slightly improved from yesterday. Per nephrology, believe this could be patient's new baseline function. Given the severity of his disease, likely will end up needing dialysis. In discussion with the  patient this morning, he says that he is motivated to "get on track" and start taking care of himself. Patient to follow-up with [facility]. - Follow-up with [facility] - Vein mapping - Strict I/O  #Severe asymptomatic hypertension Blood pressures have improved since admission but continue to be slightly elevated. Will hold off on further titration of medication as to not drop his pressures too quickly. Patient to follow-up  with [facility] for further blood pressure monitoring and treatment. - Amlodipine '10mg'$  daily - Carvedilol 12.'5mg'$  BID - Isosorbide mononitrate '300mg'$  daily - PO lasix '40mg'$  BID  #Chronic normocytic anemia #Iron deficiency anemia Hgb stable today after needed transfusion yesterday. Likely both anemia of chronic disease and iron deficiency contributing. Will have patient re-start his home iron supplement upon discharge. - Daily CBC - Re-start home iron supplementation  #H pylori gastritis Patient currently not complaining of abdominal pain and continues to do well with po intake. Patient to continue treatment upon discharge. - Doxycycline '100mg'$  BID - Metronidazole '250mg'$  BID - Pantoprazole '40mg'$  qd  DIET: HH IVF: n/a DVT PPX: Lovenox BOWEL: n/a CODE: FULL FAM COM: n/a  Prior to Admission Living Arrangement: Home Anticipated Discharge Location: Home Barriers to Discharge: none Dispo: Anticipated discharge in approximately 0 day(s).   Sanjuan Dame, MD 10/30/2020, 5:59 AM Pager: [phone] After 5pm on weekdays and 1pm on weekends: On Call pager [phone]

## 2020-10-30 NOTE — Discharge Instructions (Signed)
End-Stage Kidney Disease End-stage kidney disease occurs when the kidneys are so damaged that they cannot function and cannot get better. This condition may also be referred to as end-stage renal disease or ESRD. The kidneys are two organs that do many important jobs in the body, including:  Removing waste and extra fluid from the blood to make urine.  Making hormones that maintain the amount of fluid in tissues and blood vessels.  Maintaining the right amount of fluids and chemicals in the body. Without functioning kidneys, toxins build up in the blood, and life-threatening complications can occur. What are the causes? This condition usually occurs when a long-term (chronic) kidney disease gets worse and results in permanent damage to the kidneys. It may also be caused by sudden damage to the kidneys (acute kidney injury). What increases the risk? The following factors may make you more likely to develop this condition:  Having a family history of chronic kidney disease (CKD).  Chronic medical conditions that affect the kidneys, such as: ? Cardiovascular disease, including high blood pressure. ? Diabetes. ? Certain diseases that affect the body's disease-fighting system (immunesystem).  Smoking or a history of smoking.  Overuse of over-the-counter pain medicines.  Being around or being in contact with poisonous (toxic) substances. What are the signs or symptoms? Symptoms of this condition include:  Swelling of the face, legs, ankles, or feet.  Numbness, tingling, or loss of feeling in the hands or feet.  Tiredness (lethargy).  Nausea or vomiting.  Confusion, trouble concentrating, or loss of consciousness.  Chest pain.  Shortness of breath.  Passing little or no urine.  Muscle twitches and cramps, especially in the legs.  Skin changes, such as: ? Dry, itchy skin. ? Pale skin due to anemia, including the skin and tissue around the eye (conjunctiva). ? Abnormally dark  or light skin.  Loss of appetite.  Headaches.  Decrease in muscle size (muscle wasting).  Easy bruising.  Stopping of menstrual periods, in women.  Jerky movements (seizures). How is this diagnosed? This condition may be diagnosed based on:  A physical exam, including blood pressure measurements.  Urine tests.  Blood tests.  Imaging tests.  A test in which a sample of tissue is removed from the kidneys to be examined under a microscope (kidney biopsy). How is this treated? This condition may be treated with:  Dialysis, which is a procedure that removes toxic wastes from the body. There are two types of dialysis: ? Dialysis that is done through your abdomen. This is called peritoneal dialysis and may be done several times a day. ? Dialysis that is done by a machine. This is called hemodialysis and may be done several times a week.  Kidney transplant. This is surgery to receive a new kidney. In addition to having dialysis or a kidney transplant, you may need to take medicines:  To control high blood pressure (hypertension).  To control high cholesterol.  To treat diabetes.  To maintain healthy levels of minerals in the blood (electrolytes). You may also be given a specific meal plan to follow that includes requirements or limits for:  Salt (sodium).  Protein.  Phosphorus.  Potassium.  Calcium. Follow these instructions at home: Medicines  Take over-the-counter and prescription medicines only as told by your health care provider.  Do not take any new medicines, vitamins, or mineral supplements unless approved by your health care provider. Many medicines and supplements can worsen kidney damage.  Follow instructions from your health care provider about  adjusting the doses of any medicines you take. Lifestyle  Do not use any products that contain nicotine or tobacco, such as cigarettes, e-cigarettes, and chewing tobacco. If you need help quitting, ask your  health care provider.  Achieve and maintain a healthy weight. If you need help with this, ask your health care provider.  Start or continue an exercise plan. Exercise at least 30 minutes a day, 5 days a week.  Follow your prescribed meal plan. General instructions  Stay current with your shots (immunizations) as told by your health care provider.  Keep track of your blood pressure. Report changes in your blood pressure as told by your health care provider.  If you are being treated for diabetes, monitor and track your blood sugar (blood glucose) levels as told by your health care provider.  Keep all follow-up visits. This is important. Where to find more information  American Association of Kidney Patients: BombTimer.gl  National Kidney Foundation: www.kidney.[place]: https://mathis.com/  [facility]: www.lifeoptions.org  Kidney [employer]: www.kidneyschool.org Contact a health care provider if:  Your symptoms get worse.  You develop new symptoms. Get help right away if:  You have weakness in an arm or leg on one side of your body.  You have difficulty speaking or you are slurring your speech.  You have a sudden change in your vision.  You have a sudden, severe headache.  You have a sudden weight increase.  You have difficulty breathing.  Your symptoms suddenly get worse.  You have chest pain. Summary  End-stage kidney disease occurs when the kidneys are so damaged that they cannot function and cannot get better.  Without functioning kidneys, toxins build up in the blood and life-threatening complications can occur.  Treatment may include dialysis or a kidney transplant along with medicines and lifestyle changes. This information is not intended to replace advice given to you by your health care provider. Make sure you discuss any questions you have with your health care provider. Document Revised: 09/21/2019 Document Reviewed:  08/28/2019 Elsevier Patient Education  [place].

## 2020-10-31 ENCOUNTER — Telehealth: Payer: Self-pay | Admitting: Student

## 2020-10-31 LAB — KAPPA/LAMBDA LIGHT CHAINS
Kappa free light chain: 310.9 mg/L — ABNORMAL HIGH (ref 3.3–19.4)
Kappa, lambda light chain ratio: 2.1 — ABNORMAL HIGH (ref 0.26–1.65)
Lambda free light chains: 148.1 mg/L — ABNORMAL HIGH (ref 5.7–26.3)

## 2020-10-31 LAB — ANA W/REFLEX IF POSITIVE: Anti Nuclear Antibody (ANA): NEGATIVE

## 2020-10-31 NOTE — Telephone Encounter (Signed)
TOC HFU APPT [facility] FOR 11/03/2020 @ 3:15 PM WITH DR Bridgett Larsson

## 2020-11-03 ENCOUNTER — Ambulatory Visit: Payer: 59 | Admitting: Student

## 2020-11-09 LAB — HCV RT-PCR, QUANT (NON-GRAPH)

## 2020-11-09 LAB — HCV RNA QUANT

## 2020-11-09 LAB — HCV AB W REFLEX TO QUANT PCR: HCV Ab: >11 s/co ratio — ABNORMAL HIGH (ref 0.0–0.9)

## 2020-11-10 ENCOUNTER — Encounter: Payer: 59 | Admitting: Student

## 2020-11-22 ENCOUNTER — Encounter: Payer: 59 | Admitting: Student

## 2020-12-26 ENCOUNTER — Telehealth: Payer: Self-pay | Admitting: *Deleted

## 2020-12-26 NOTE — Telephone Encounter (Signed)
If pt agreeable, repeat EGD is recommended - nonurgent; dx of gastritis with metaplasia and H pylori  If he has questions or wishes to discuss, then OV 1st

## 2020-12-26 NOTE — Telephone Encounter (Signed)
I have left a message for patient to call back. 

## 2020-12-26 NOTE — Telephone Encounter (Signed)
-----   Message from Larina Bras, [place] sent at 10/21/2020  2:31 PM EDT ----- Pt needs h pylori stool antigen around 01/06/21 (as long as he took antibiotics as asked to). Call and remind pt. Orders already in epic. Also, ask pyrtle again about possible egd with gastric mapping. See surg path note from last egd,

## 2020-12-26 NOTE — Telephone Encounter (Signed)
Dr Hilarie Fredrickson- Your result note for patient's endoscopy completed 08/31/20 indicates he may need egd with gastric mapping which would be discussed at an office visit 09/2020....  Jerene Bears, MD  09/07/2020  4:26 PM EDT      Thanks Holley Bouche -- please treat with Pat Kocher or Pylera (depending on coverage) with BIDPPI He has OV with me scheduled in April and we can discuss repeat EGD for gastricmapping (and confirmation of eradication of HP) at that time [facility], a subsequent note by you indicated that he did not need follow up office visit, therefore he has not had an office follow up appointment. Do you still think appropriate for EGD with gastric mapping or is he okay without it?

## 2020-12-27 NOTE — Telephone Encounter (Signed)
I have spoken to patient to advise that we do need him to have a repeat endoscopy (with gastric mapping) for follow up of his gastritis with metaplasia and H Pylori. He is agreeable to setting this up directly without seeing Dr Hilarie Fredrickson in office to discuss first.   Patient has been scheduled for endoscopy on 01/06/21 at 35 am. He indicates he will try to set up MyChart so we can send instructions to him. If he is unable to do this, he will contact me so we can mail instructions instead.

## 2020-12-30 NOTE — Telephone Encounter (Signed)
I contacted patient to follow up regarding him setting up his MyChart account so instructions can be given to him. He states that he just hasnt had time to set it up because he is on vacation. I advised that we need to get instructions to him or cannot complete the procedure scheduled for 01/06/21. He verbalizes understanding and indicates that he is going to set up MyChart so I can send instructions to himm.

## 2021-01-03 NOTE — Telephone Encounter (Signed)
I have left a message for patient indicating that I have cancelled his 01/06/21 endoscopy at this time as I have been unable to reach him to arrange for him to get instructions for procedure prep. I have requested that he call me back to reschedule and come for previsit.

## 2021-01-06 ENCOUNTER — Encounter: Payer: 59 | Admitting: Internal Medicine

## 2021-02-07 ENCOUNTER — Observation Stay (HOSPITAL_COMMUNITY): Payer: 59

## 2021-02-07 ENCOUNTER — Emergency Department (HOSPITAL_COMMUNITY): Payer: 59

## 2021-02-07 ENCOUNTER — Other Ambulatory Visit: Payer: Self-pay

## 2021-02-07 ENCOUNTER — Inpatient Hospital Stay (HOSPITAL_COMMUNITY)
Admission: EM | Admit: 2021-02-07 | Discharge: 2021-02-15 | DRG: 673 | Disposition: A | Discharge status: Home / Self Care | Payer: 59 | Attending: Internal Medicine | Admitting: Internal Medicine

## 2021-02-07 DIAGNOSIS — N186 End stage renal disease: Secondary | ICD-10-CM | POA: Diagnosis present

## 2021-02-07 DIAGNOSIS — Z23 Encounter for immunization: Secondary | ICD-10-CM

## 2021-02-07 DIAGNOSIS — L309 Dermatitis, unspecified: Secondary | ICD-10-CM | POA: Diagnosis present

## 2021-02-07 DIAGNOSIS — N5089 Other specified disorders of the male genital organs: Secondary | ICD-10-CM

## 2021-02-07 DIAGNOSIS — Z79899 Other long term (current) drug therapy: Secondary | ICD-10-CM

## 2021-02-07 DIAGNOSIS — D696 Thrombocytopenia, unspecified: Secondary | ICD-10-CM | POA: Diagnosis present

## 2021-02-07 DIAGNOSIS — D6489 Other specified anemias: Secondary | ICD-10-CM | POA: Diagnosis present

## 2021-02-07 DIAGNOSIS — I10 Essential (primary) hypertension: Secondary | ICD-10-CM

## 2021-02-07 DIAGNOSIS — N179 Acute kidney failure, unspecified: Secondary | ICD-10-CM | POA: Diagnosis not present

## 2021-02-07 DIAGNOSIS — R600 Localized edema: Secondary | ICD-10-CM | POA: Diagnosis present

## 2021-02-07 DIAGNOSIS — N50819 Testicular pain, unspecified: Secondary | ICD-10-CM

## 2021-02-07 DIAGNOSIS — I161 Hypertensive emergency: Secondary | ICD-10-CM | POA: Diagnosis present

## 2021-02-07 DIAGNOSIS — E872 Acidosis: Secondary | ICD-10-CM | POA: Diagnosis present

## 2021-02-07 DIAGNOSIS — N433 Hydrocele, unspecified: Secondary | ICD-10-CM | POA: Diagnosis present

## 2021-02-07 DIAGNOSIS — E876 Hypokalemia: Secondary | ICD-10-CM | POA: Diagnosis not present

## 2021-02-07 DIAGNOSIS — I878 Other specified disorders of veins: Secondary | ICD-10-CM | POA: Diagnosis present

## 2021-02-07 DIAGNOSIS — N503 Cyst of epididymis: Secondary | ICD-10-CM | POA: Diagnosis present

## 2021-02-07 DIAGNOSIS — Z20822 Contact with and (suspected) exposure to covid-19: Secondary | ICD-10-CM | POA: Diagnosis present

## 2021-02-07 DIAGNOSIS — D649 Anemia, unspecified: Secondary | ICD-10-CM | POA: Diagnosis present

## 2021-02-07 DIAGNOSIS — I5043 Acute on chronic combined systolic (congestive) and diastolic (congestive) heart failure: Secondary | ICD-10-CM | POA: Diagnosis present

## 2021-02-07 DIAGNOSIS — I132 Hypertensive heart and chronic kidney disease with heart failure and with stage 5 chronic kidney disease, or end stage renal disease: Secondary | ICD-10-CM | POA: Diagnosis present

## 2021-02-07 DIAGNOSIS — F17210 Nicotine dependence, cigarettes, uncomplicated: Secondary | ICD-10-CM | POA: Diagnosis present

## 2021-02-07 DIAGNOSIS — E875 Hyperkalemia: Secondary | ICD-10-CM | POA: Diagnosis present

## 2021-02-07 DIAGNOSIS — H5461 Unqualified visual loss, right eye, normal vision left eye: Secondary | ICD-10-CM | POA: Diagnosis present

## 2021-02-07 DIAGNOSIS — Z833 Family history of diabetes mellitus: Secondary | ICD-10-CM

## 2021-02-07 DIAGNOSIS — Z8249 Family history of ischemic heart disease and other diseases of the circulatory system: Secondary | ICD-10-CM

## 2021-02-07 DIAGNOSIS — H409 Unspecified glaucoma: Secondary | ICD-10-CM | POA: Diagnosis present

## 2021-02-07 DIAGNOSIS — N4341 Spermatocele of epididymis, single: Secondary | ICD-10-CM | POA: Diagnosis present

## 2021-02-07 DIAGNOSIS — E1122 Type 2 diabetes mellitus with diabetic chronic kidney disease: Secondary | ICD-10-CM | POA: Diagnosis present

## 2021-02-07 DIAGNOSIS — D631 Anemia in chronic kidney disease: Secondary | ICD-10-CM | POA: Diagnosis present

## 2021-02-07 DIAGNOSIS — B1920 Unspecified viral hepatitis C without hepatic coma: Secondary | ICD-10-CM | POA: Diagnosis present

## 2021-02-07 DIAGNOSIS — N189 Chronic kidney disease, unspecified: Secondary | ICD-10-CM | POA: Diagnosis present

## 2021-02-07 LAB — CBC
HCT: 18.9 % — ABNORMAL LOW (ref 39.0–52.0)
Hemoglobin: 5.5 g/dL — CL (ref 13.0–17.0)
MCH: 25.9 pg — ABNORMAL LOW (ref 26.0–34.0)
MCHC: 29.1 g/dL — ABNORMAL LOW (ref 30.0–36.0)
MCV: 89.2 fL (ref 80.0–100.0)
Platelets: 156 10*3/uL (ref 150–400)
RBC: 2.12 MIL/uL — ABNORMAL LOW (ref 4.22–5.81)
RDW: 18.5 % — ABNORMAL HIGH (ref 11.5–15.5)
WBC: 5 10*3/uL (ref 4.0–10.5)
nRBC: 0 % (ref 0.0–0.2)

## 2021-02-07 LAB — RESP PANEL BY RT-PCR (FLU A&B, COVID) ARPGX2
Influenza A by PCR: NEGATIVE
Influenza B by PCR: NEGATIVE
SARS Coronavirus 2 by RT PCR: NEGATIVE

## 2021-02-07 LAB — BASIC METABOLIC PANEL
Anion gap: 8 (ref 5–15)
BUN: 68 mg/dL — ABNORMAL HIGH (ref 8–23)
CO2: 16 mmol/L — ABNORMAL LOW (ref 22–32)
Calcium: 7.8 mg/dL — ABNORMAL LOW (ref 8.9–10.3)
Chloride: 113 mmol/L — ABNORMAL HIGH (ref 98–111)
Creatinine, Ser: 6.13 mg/dL — ABNORMAL HIGH (ref 0.61–1.24)
GFR, Estimated: 10 mL/min — ABNORMAL LOW (ref >60)
Glucose, Bld: 115 mg/dL — ABNORMAL HIGH (ref 70–99)
Potassium: 5.3 mmol/L — ABNORMAL HIGH (ref 3.5–5.1)
Sodium: 137 mmol/L (ref 135–145)

## 2021-02-07 LAB — MAGNESIUM: Magnesium: 1.9 mg/dL (ref 1.7–2.4)

## 2021-02-07 LAB — BRAIN NATRIURETIC PEPTIDE: B Natriuretic Peptide: 2851.9 pg/mL — ABNORMAL HIGH (ref 0.0–100.0)

## 2021-02-07 LAB — PREPARE RBC (CROSSMATCH)

## 2021-02-07 LAB — TROPONIN I (HIGH SENSITIVITY)
Troponin I (High Sensitivity): 36 ng/L — ABNORMAL HIGH (ref <18)
Troponin I (High Sensitivity): 37 ng/L — ABNORMAL HIGH (ref <18)

## 2021-02-07 IMAGING — CR DG CHEST 2V
2 series · 2 of 2 positions shown · non-contrast
Comparison: [DATE].

CLINICAL DATA: History of diabetes and hypertension at age 65 now
with bilateral lower extremity edema and leg swelling for 3 days.

EXAM:
CHEST - 2 VIEW

[chest pa]
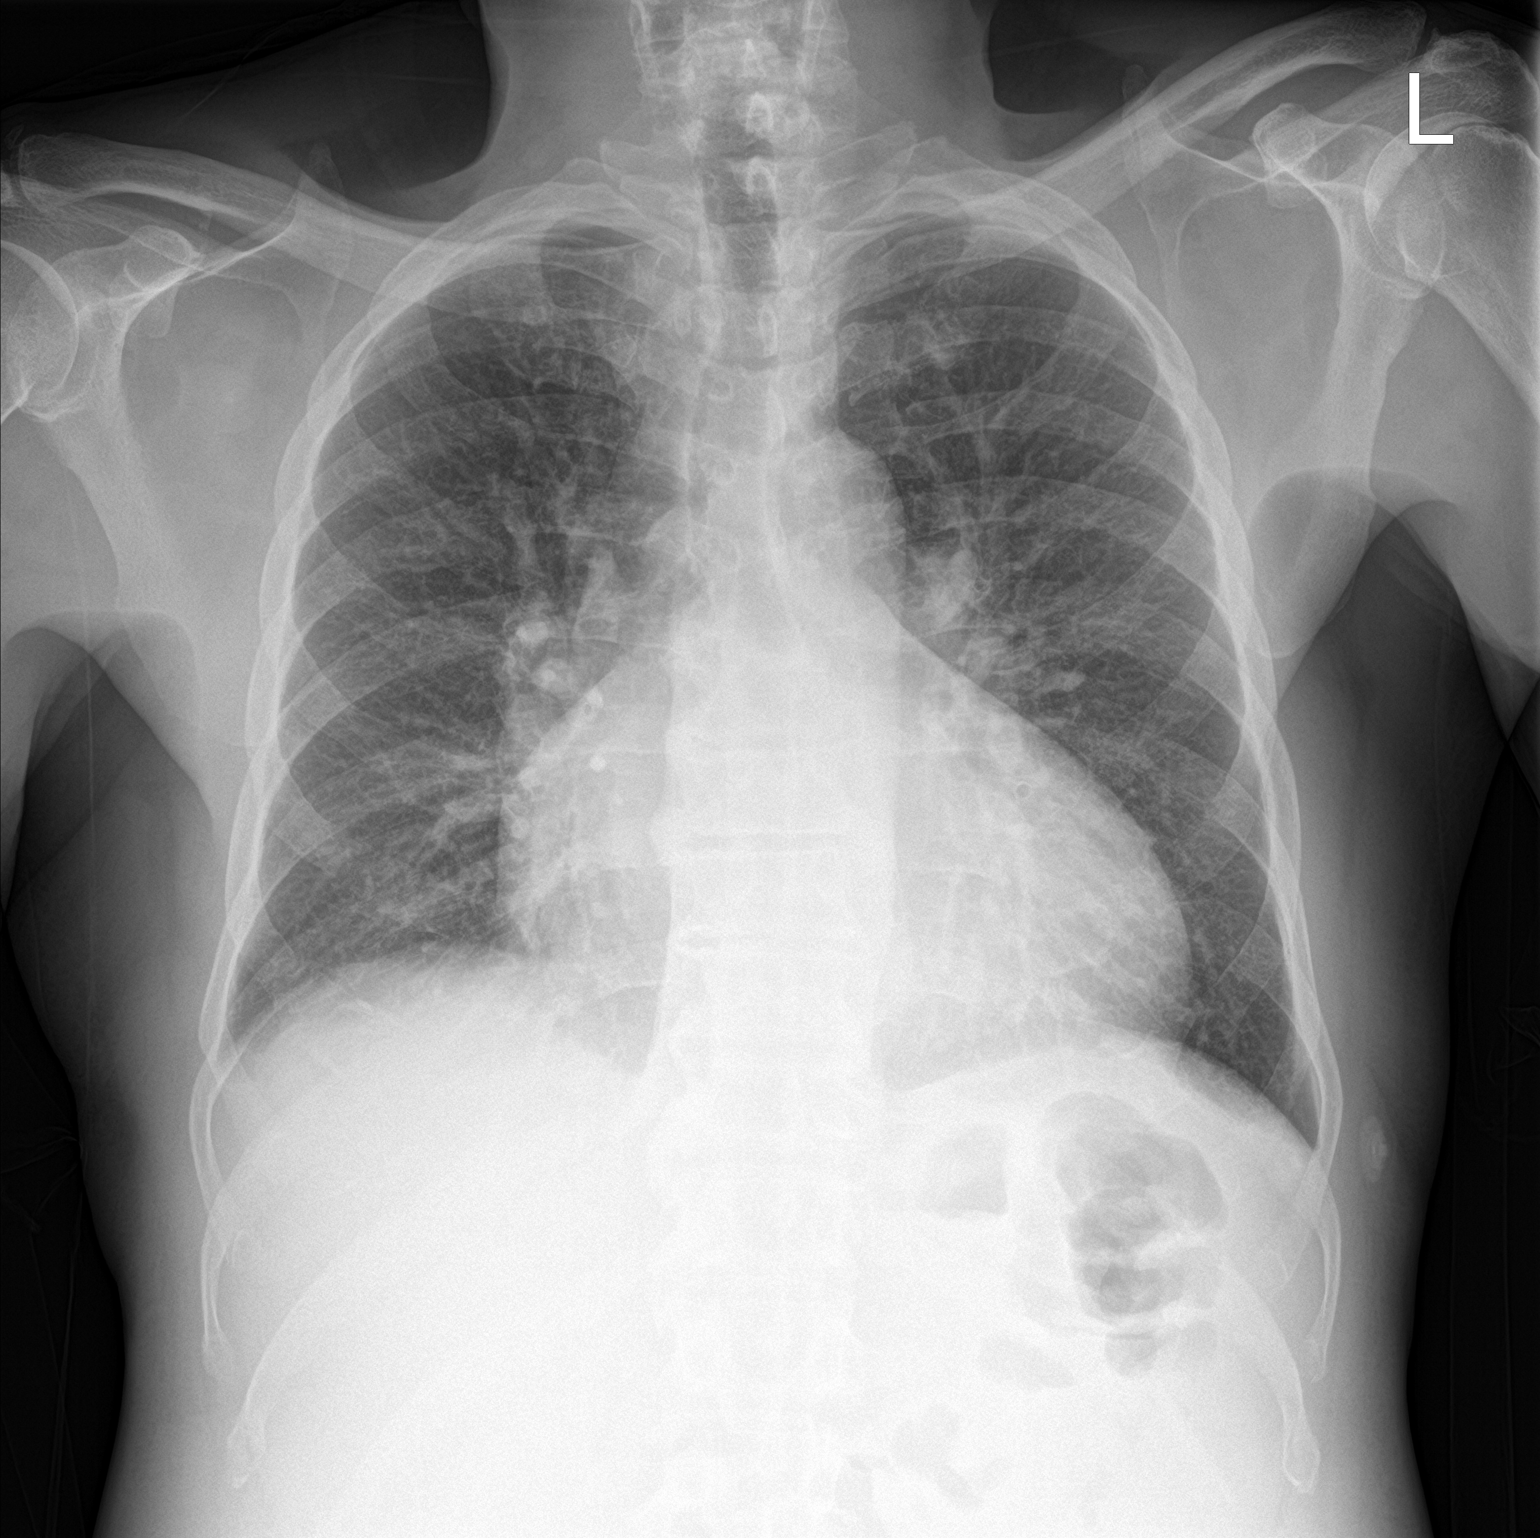

[chest lat]
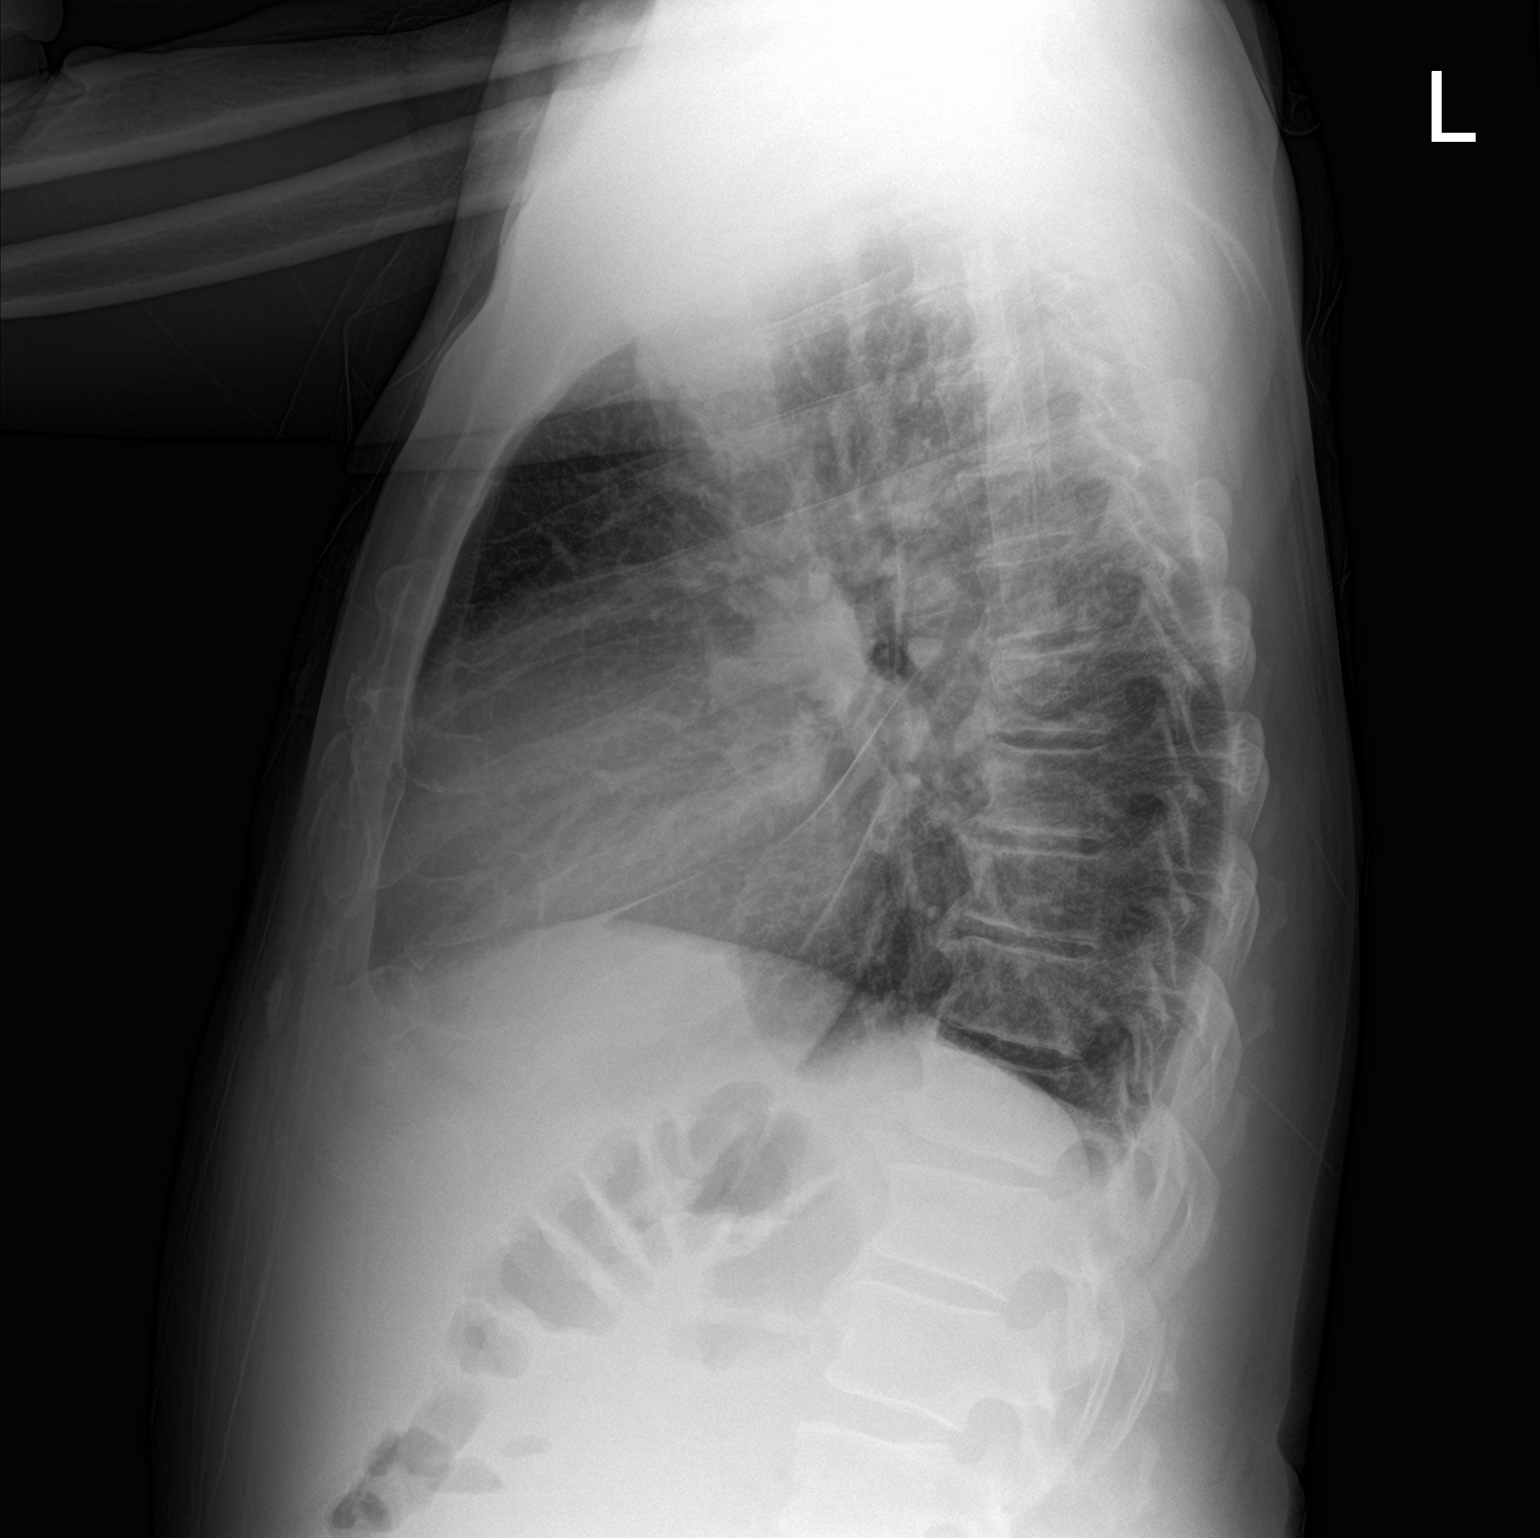

[2 of 2 positions shown; findings below may reference images not displayed]

FINDINGS: Findings trachea midline. Heart size remains enlarged. Central
pulmonary structures and hilar structures are mildly engorged.

No lobar consolidative process or sign of pleural effusion. No
pneumothorax.

On limited assessment no acute skeletal process.
IMPRESSION: Cardiomegaly with mild central pulmonary vascular congestion.

## 2021-02-07 IMAGING — CT CT HEAD W/O CM
4 series · 16 of 47 positions shown, 18 images · non-contrast
Comparison: None

CLINICAL DATA: 65-year-old male with unilateral blurred vision by
report.

EXAM:
CT HEAD WITHOUT CONTRAST
TECHNIQUE: Contiguous axial images were obtained from the base of the skull
through the vertex without intravenous contrast.

[Series 3: head bone · axial · 0.43mm/px · z∈[-107,-75]mm · 3 of 80 slices shown]
[im 8/80  bone]
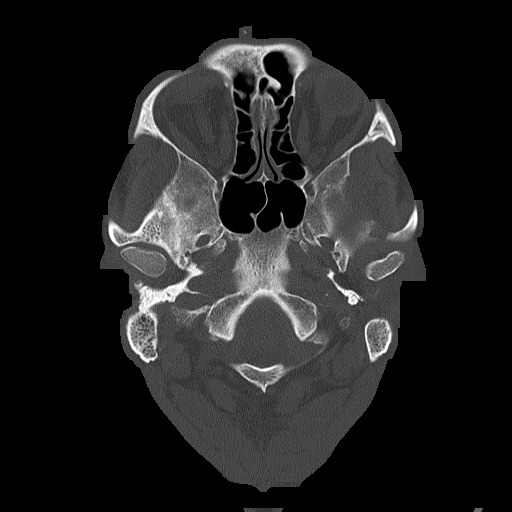
[im 16/80  bone]
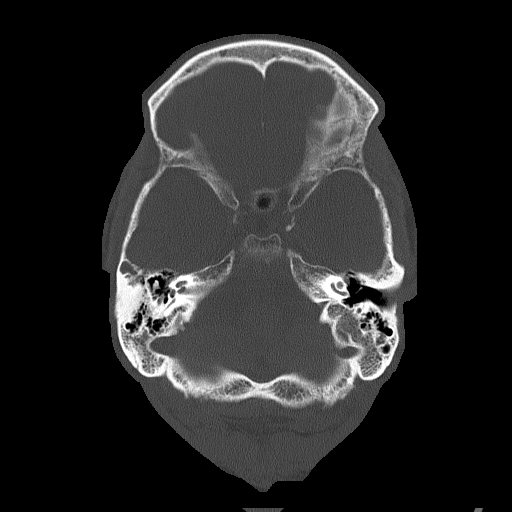
[im 24/80  bone]
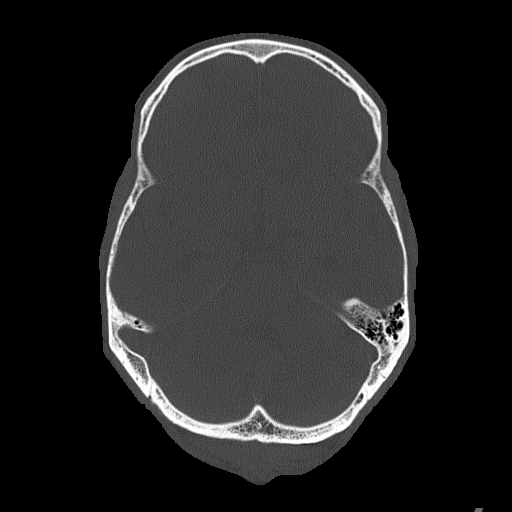

[Series 4: head without · axial · non-contrast · 0.43mm/px · z∈[-106,+14]mm · 7 of 32 slices shown, 9 images]
[im 4/32  brain]
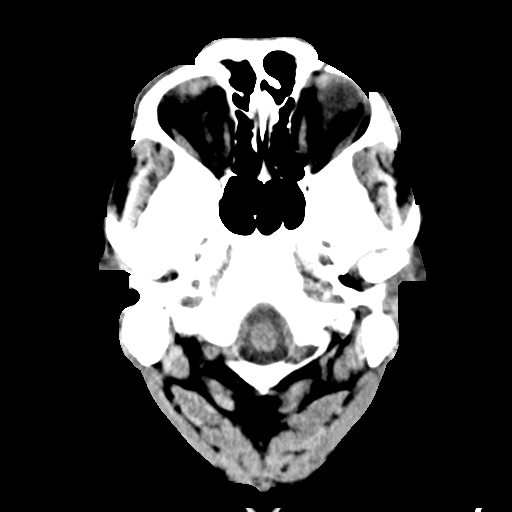
[im 4/32  bone]
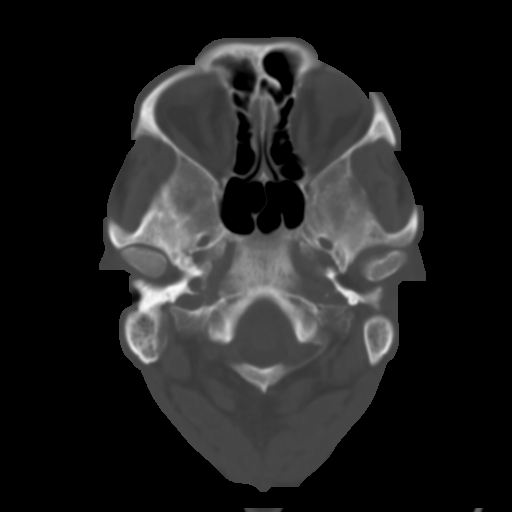
[im 8/32  brain]
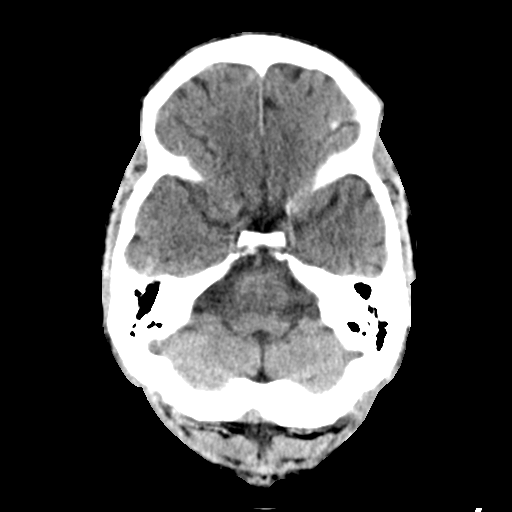
[im 12/32  brain]
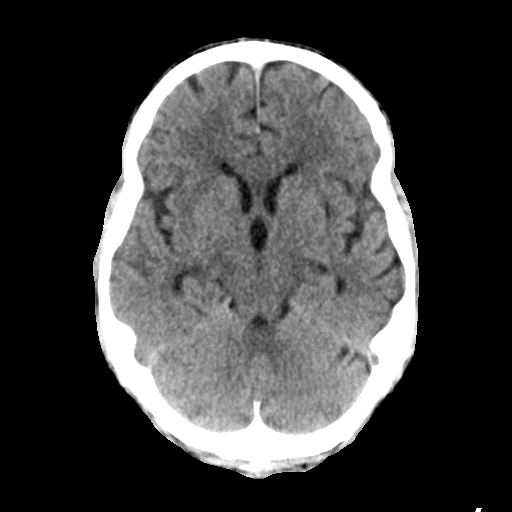
[im 16/32  brain]
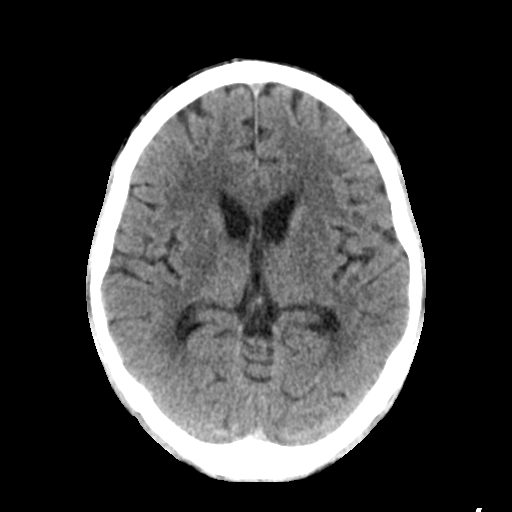
[im 20/32  brain]
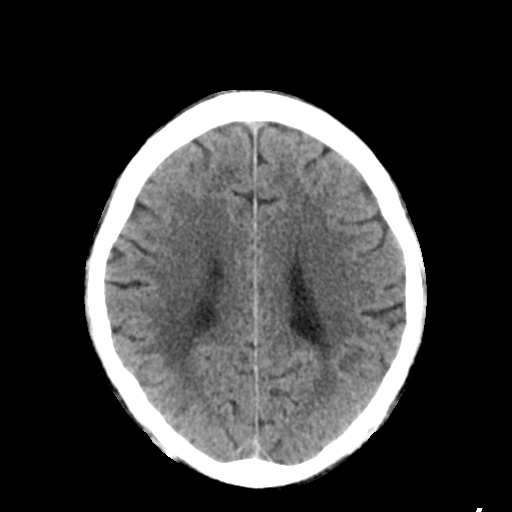
[im 20/32  bone]
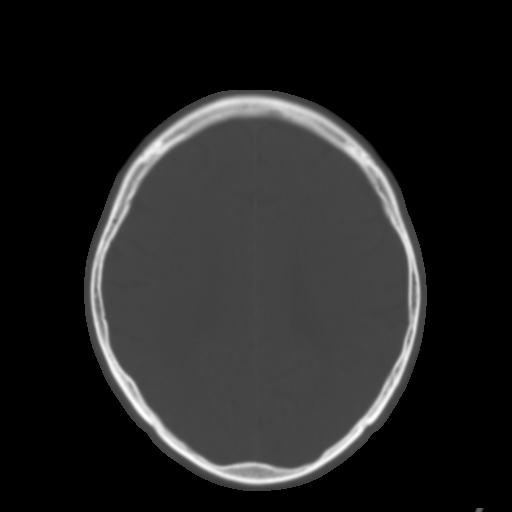
[im 24/32  brain]
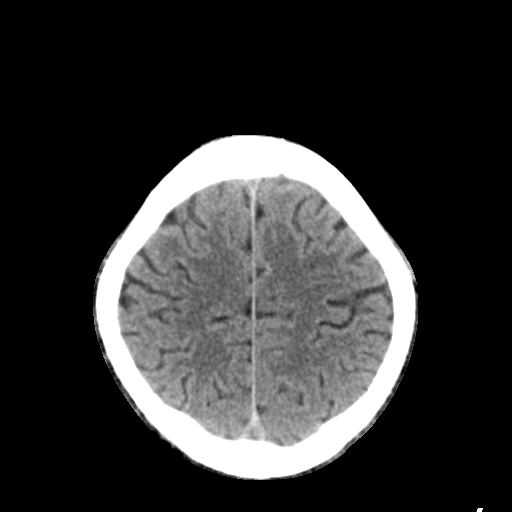
[im 28/32  brain]
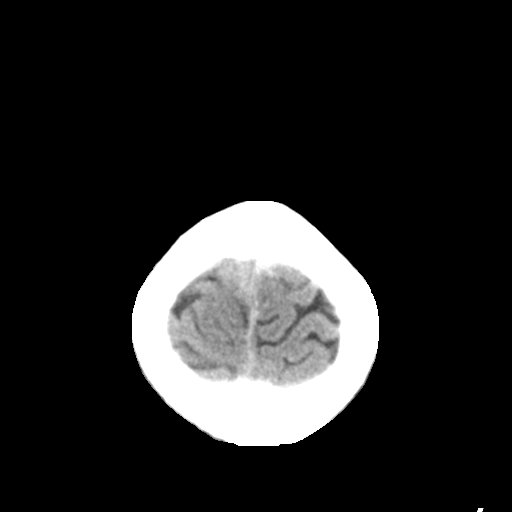

[Series 5: head without cor · coronal · non-contrast · 0.33mm/px · 3 of 65 slices shown]
[im 22/65  brain]
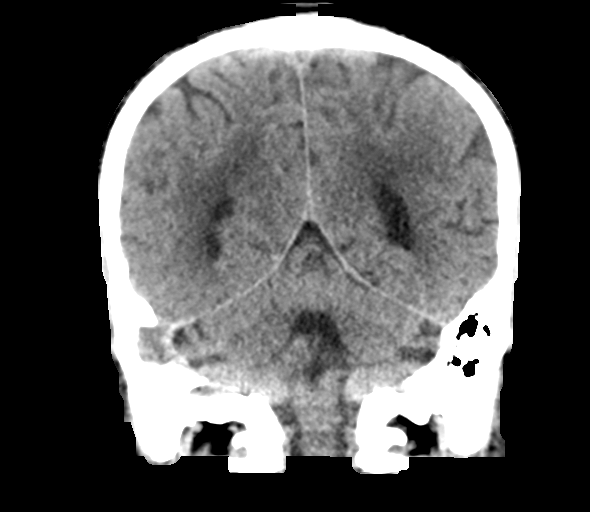
[im 29/65  brain]
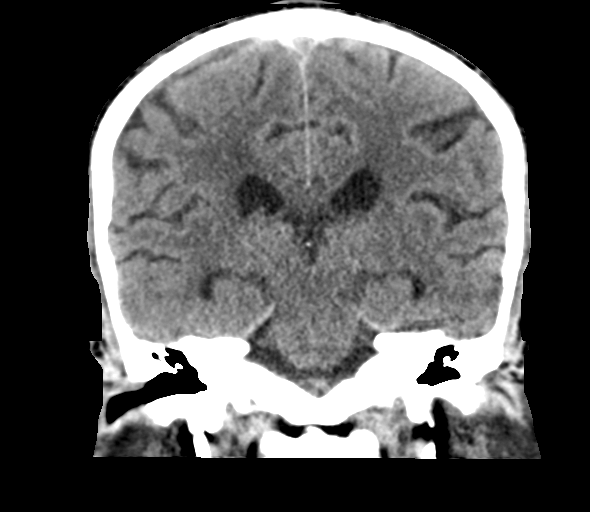
[im 36/65  brain]
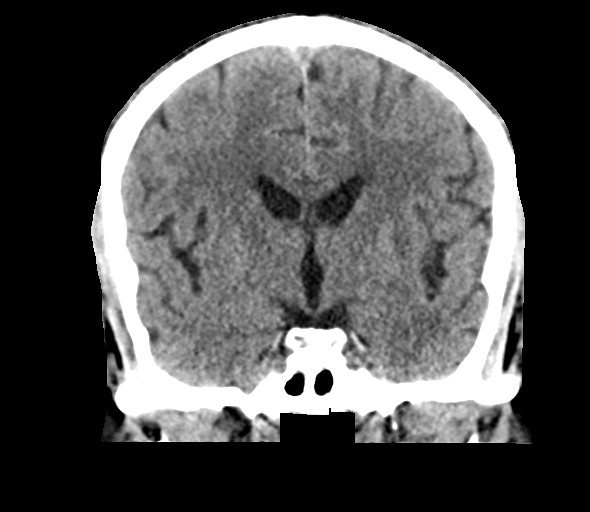

[Series 6: head without sag · sagittal · non-contrast · 0.37mm/px · 3 of 53 slices shown]
[im 18/53  brain]
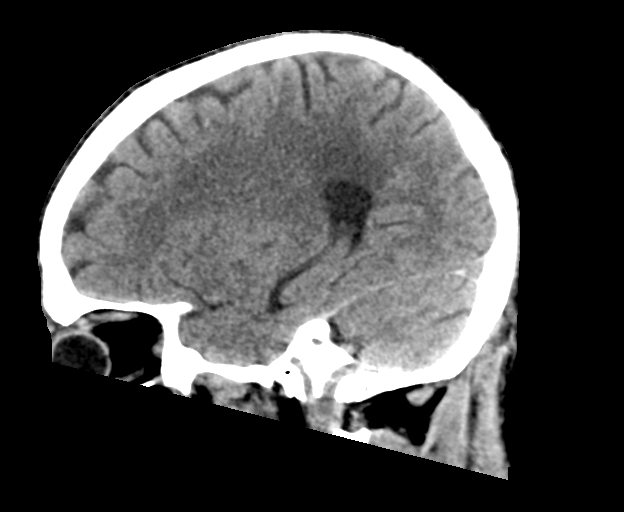
[im 27/53  brain]
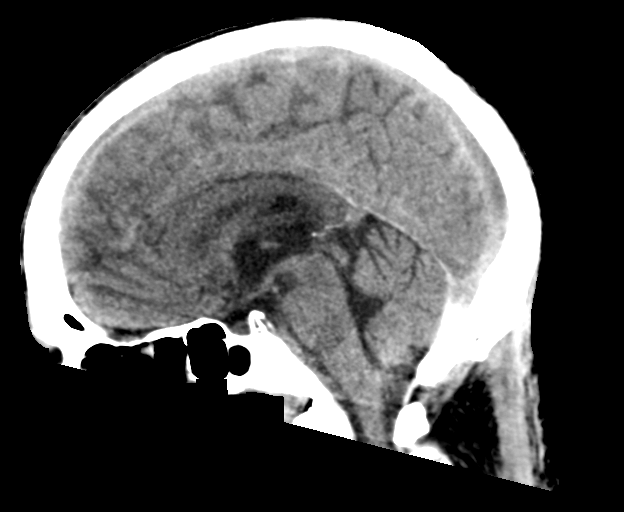
[im 35/53  brain]
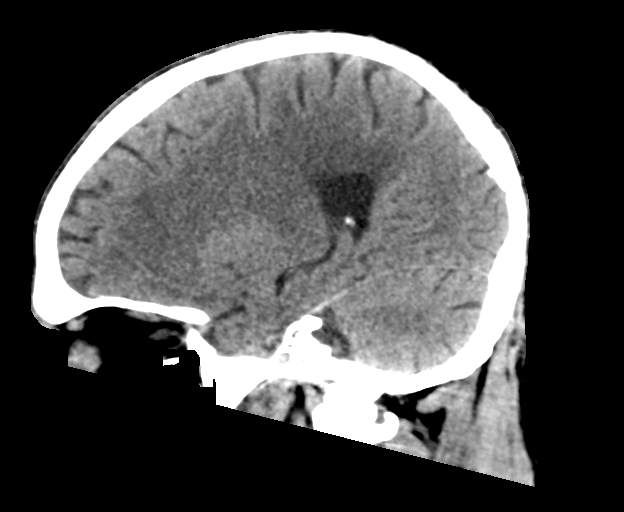

[16 of 47 positions shown; findings below may reference images not displayed]

FINDINGS: Brain: No evidence of acute infarction, hemorrhage, hydrocephalus,
extra-axial collection or mass lesion/mass effect.

Vascular: No hyperdense vessel or unexpected calcification.

Skull: Normal. Negative for fracture or focal lesion.

Sinuses/Orbits: Visualized paranasal sinuses and orbits are
unremarkable.

Other: None
IMPRESSION: No acute intracranial abnormality.

## 2021-02-07 IMAGING — US US SCROTUM W/ DOPPLER COMPLETE
1 series · 13 of 25 positions shown · non-contrast
Comparison: [DATE].

CLINICAL DATA: Testicular pain in a 65-year-old male.

EXAM:
SCROTAL ULTRASOUND
DOPPLER ULTRASOUND OF THE TESTICLES
TECHNIQUE: Complete ultrasound examination of the testicles, epididymis, and
other scrotal structures was performed. Color and spectral Doppler
ultrasound were also utilized to evaluate blood flow to the
testicles.

[Series 1: us scrotum w/doppler · 13 of 48 slices shown]
[im 1/48]
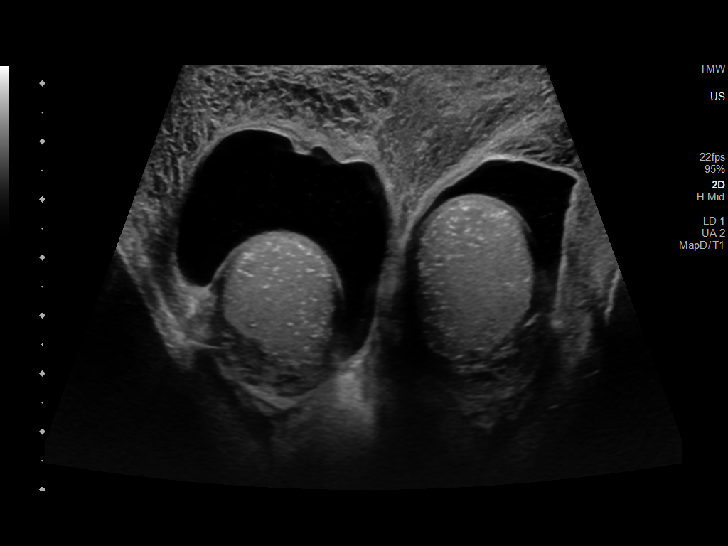
[im 4/48]
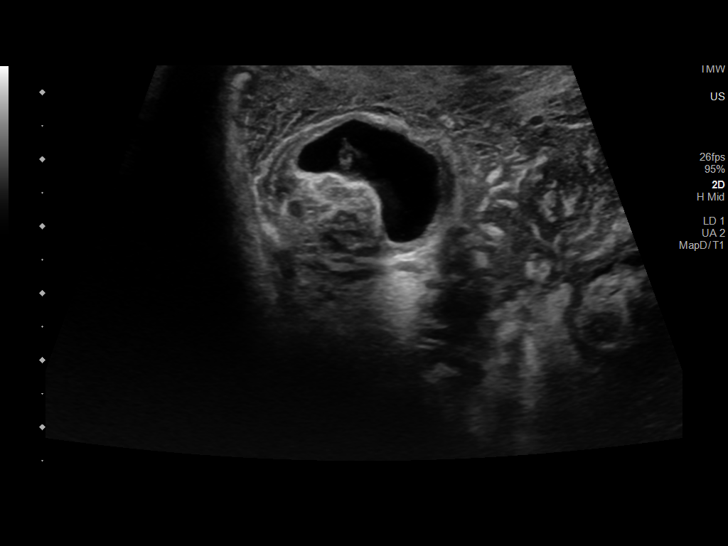
[im 8/48]
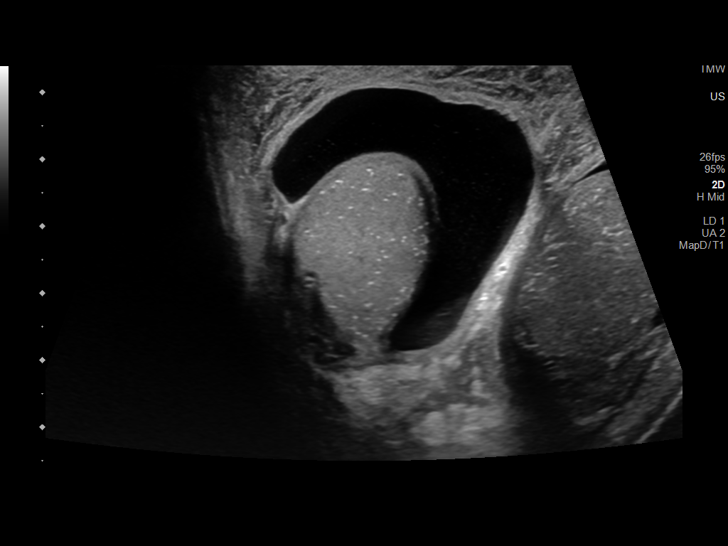
[im 12/48]
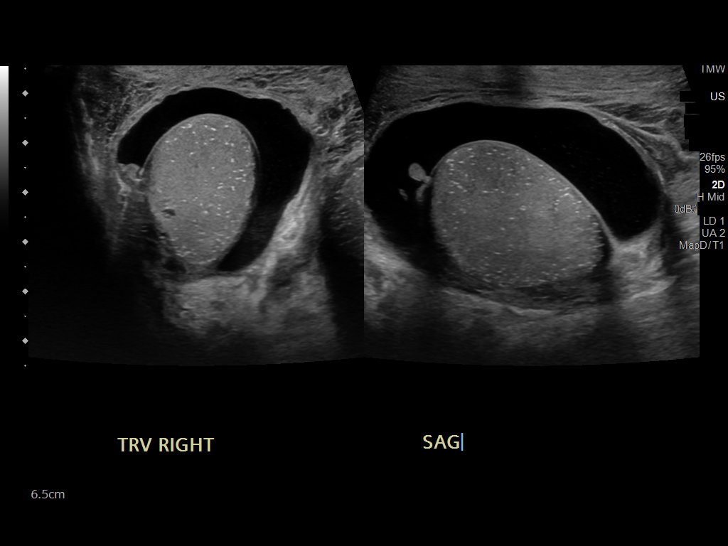
[im 16/48]
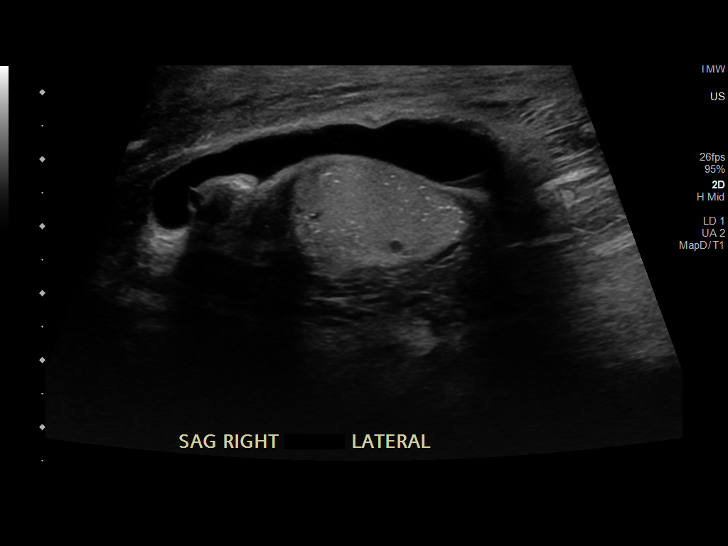
[im 20/48]
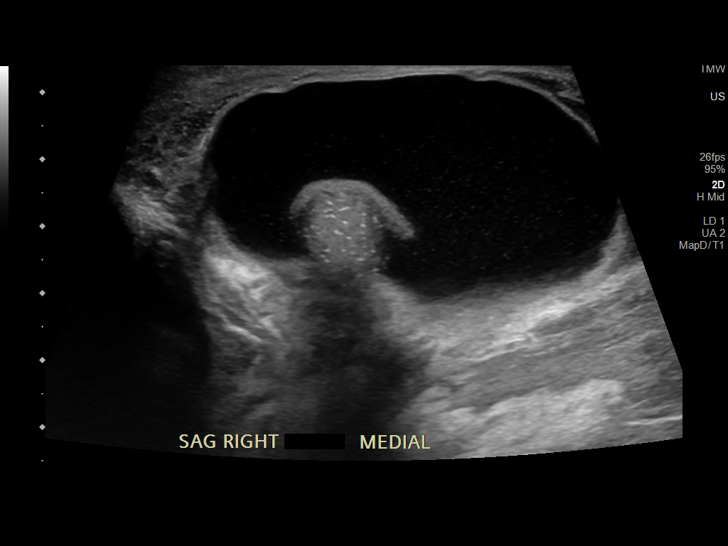
[im 24/48]
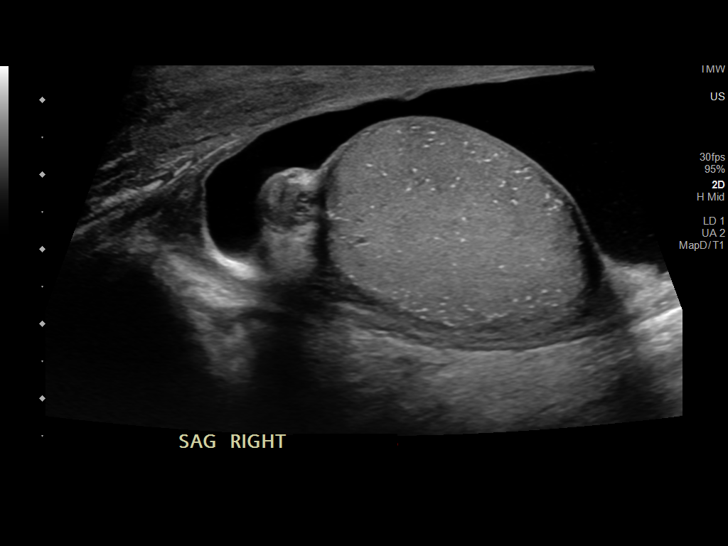
[im 28/48]
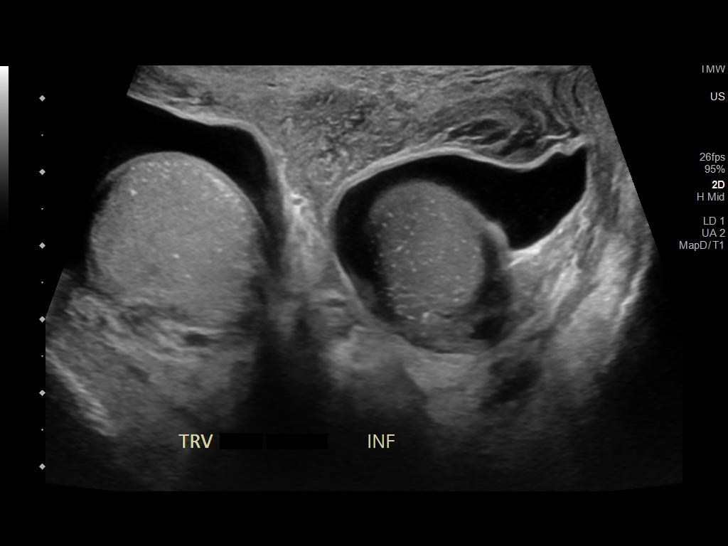
[im 32/48]
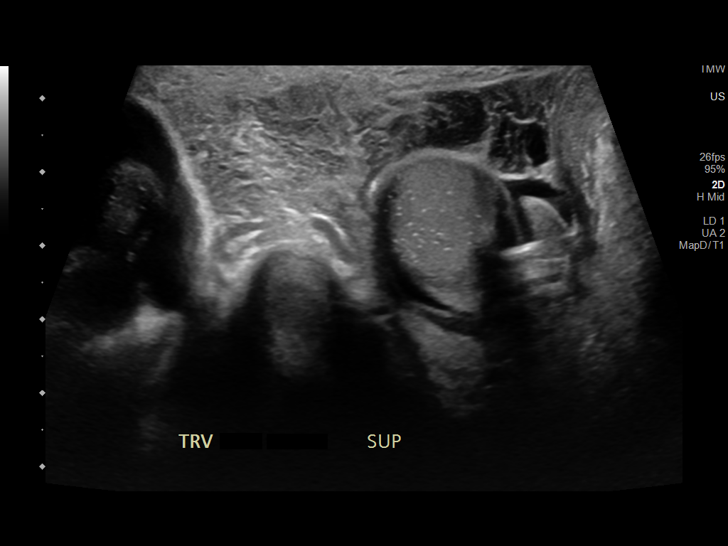
[im 36/48]
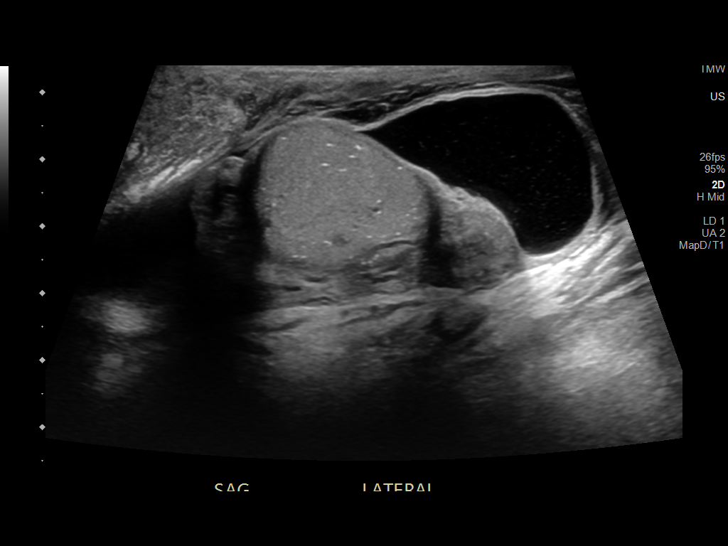
[im 40/48]
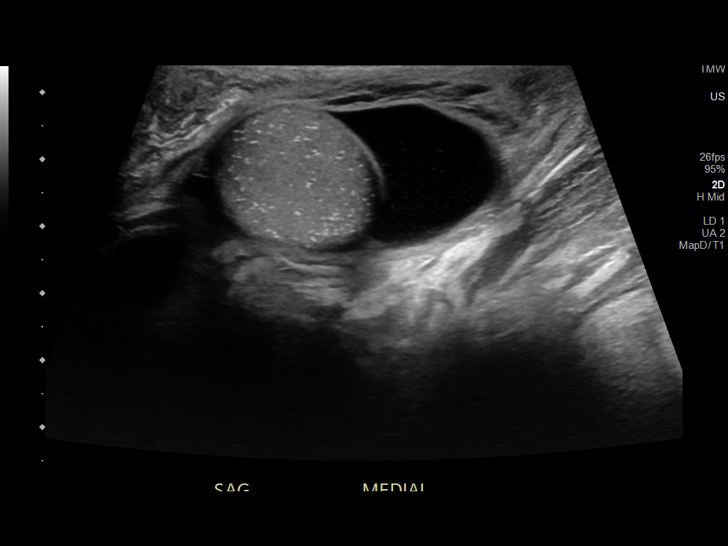
[im 44/48]
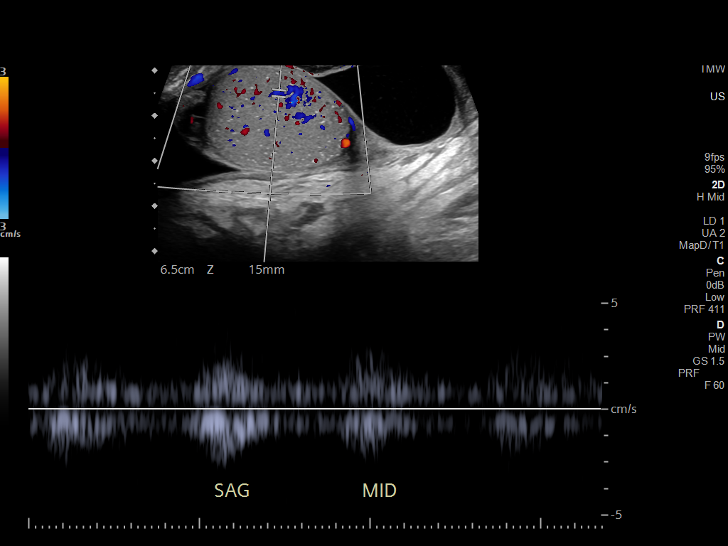
[im 48/48]
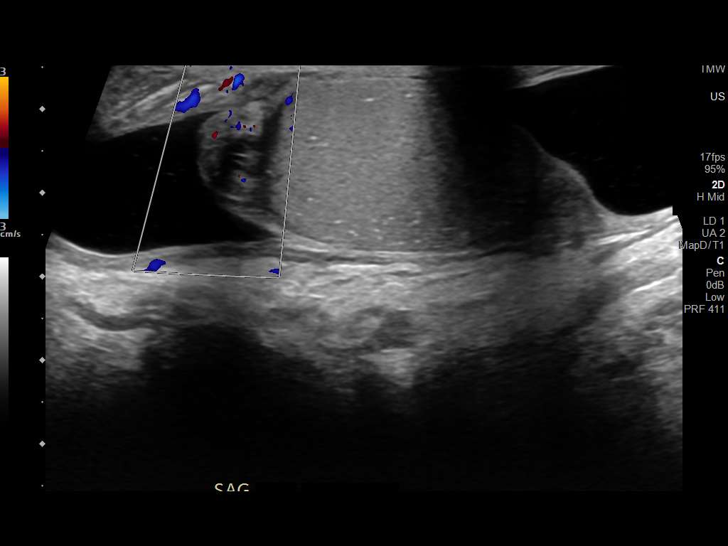

[13 of 25 positions shown; findings below may reference images not displayed]

FINDINGS: Right testicle

Measurements: 3.9 x 2.8 x 2.4 cm. No visible mass, signs of
abundant microlithiasis.

Left testicle

Measurements: 3.3 x 2.5 x 2.5 cm. No visible mass, signs of
abundant microlithiasis.

Right epididymis:  Normal in size and appearance.

Left epididymis:  Normal in size and appearance.

Hydrocele:  Bilateral hydroceles similar to the prior study.

Varicocele:  None visualized.

Pulsed Doppler interrogation of both testes demonstrates normal low
resistance arterial and venous waveforms bilaterally.
IMPRESSION: No signs of testicular torsion or testicular mass. No findings to
indicate epididymitis or orchitis.

Bilateral hydroceles slightly greater on the RIGHT.

Signs of scrotal thickening diffusely is largely similar to the
prior study. Correlate with any signs of edema or cellulitis.

Diffuse bilateral microlithiasis similar to the prior study. Current
literature suggests that testicular microlithiasis is not a
significant independent risk factor for development of testicular
carcinoma, and that follow up imaging is not warranted in the
absence of other risk factors. Monthly testicular self-examination
and annual physical exams are considered appropriate surveillance.
If patient has other risk factors for testicular carcinoma, then
referral to Urology should be considered. (Reference: MEESTER, et
al.: A 5-Year Follow up Study of Asymptomatic Men with Testicular
Microlithiasis. J Urol [7G]; 179:[PHONE_NUMBER].)

## 2021-02-07 MED ORDER — LABETALOL HCL 5 MG/ML IV SOLN
20.0000 mg | Freq: Once | INTRAVENOUS | Status: AC
  Administered 2021-02-07: 20 mg via INTRAVENOUS
  Filled 2021-02-07: qty 4

## 2021-02-07 MED ORDER — CARVEDILOL 12.5 MG PO TABS
12.5000 mg | ORAL_TABLET | Freq: Two times a day (BID) | ORAL | Status: DC
  Administered 2021-02-07 – 2021-02-15 (×14): 12.5 mg via ORAL
  Filled 2021-02-07 (×14): qty 1

## 2021-02-07 MED ORDER — SODIUM CHLORIDE 0.9 % IV SOLN: 10.0000 mL/h | Freq: Once | INTRAVENOUS | Status: DC

## 2021-02-07 MED ORDER — PANTOPRAZOLE SODIUM 40 MG PO TBEC
40.0000 mg | DELAYED_RELEASE_TABLET | Freq: Every day | ORAL | Status: DC
  Administered 2021-02-07 – 2021-02-15 (×9): 40 mg via ORAL
  Filled 2021-02-07 (×9): qty 1

## 2021-02-07 MED ORDER — AMLODIPINE BESYLATE 10 MG PO TABS
10.0000 mg | ORAL_TABLET | Freq: Every day | ORAL | Status: DC
  Administered 2021-02-07 – 2021-02-15 (×9): 10 mg via ORAL
  Filled 2021-02-07: qty 1
  Filled 2021-02-07: qty 2
  Filled 2021-02-07 (×7): qty 1

## 2021-02-07 MED ORDER — SODIUM ZIRCONIUM CYCLOSILICATE 10 G PO PACK
10.0000 g | PACK | Freq: Every day | ORAL | Status: DC
  Administered 2021-02-07 – 2021-02-12 (×3): 10 g via ORAL
  Filled 2021-02-07 (×4): qty 1

## 2021-02-07 MED ORDER — ISOSORBIDE MONONITRATE ER 30 MG PO TB24
30.0000 mg | ORAL_TABLET | Freq: Every day | ORAL | Status: DC
  Administered 2021-02-07 – 2021-02-12 (×6): 30 mg via ORAL
  Filled 2021-02-07 (×6): qty 1

## 2021-02-07 MED ORDER — HYDRALAZINE HCL 20 MG/ML IJ SOLN
10.0000 mg | INTRAMUSCULAR | Status: DC | PRN
  Administered 2021-02-07 – 2021-02-08 (×2): 10 mg via INTRAVENOUS
  Filled 2021-02-07 (×2): qty 1

## 2021-02-07 MED ORDER — FUROSEMIDE 10 MG/ML IJ SOLN
80.0000 mg | Freq: Four times a day (QID) | INTRAMUSCULAR | Status: DC
  Administered 2021-02-08 (×2): 80 mg via INTRAVENOUS
  Filled 2021-02-07 (×3): qty 8

## 2021-02-07 MED ORDER — FENTANYL CITRATE PF 50 MCG/ML IJ SOSY
50.0000 ug | PREFILLED_SYRINGE | Freq: Once | INTRAMUSCULAR | Status: AC
Start: 2021-02-07 — End: 2021-02-07
  Administered 2021-02-07: 50 ug via INTRAVENOUS
  Filled 2021-02-07: qty 1

## 2021-02-07 MED ORDER — FUROSEMIDE 10 MG/ML IJ SOLN: 80.0000 mg | Freq: Once | INTRAMUSCULAR | Status: DC

## 2021-02-07 NOTE — H&P (Addendum)
Date: 02/07/2021               Patient Name:  Jeffrey Zuniga MRN: [id number]  DOB: August 18, 1955 Age / Sex: 65 y.o., male   PCP: Patient, No Pcp Per (Inactive)         Medical Service: Internal Medicine Teaching Service         Attending Physician: Dr. Sid Falcon, MD    First Contact: Dr. Raymondo Band Pager: [phone]  Second Contact: Dr. 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 Patient has been admitted in the past with similar complaints, mostly recently in May of this year, and was discharged on HTN meds and then later lost to follow up. Also had an admission in May of 2022 for severe symptomatic hypertension, as well as October 2021. Patient has not followed up with a PCP after these admissions.   Patient reports he takes his medications, amlodipine, carvedilol, and lasix, intermittently, usually every few days when he remembers to take them. Patient was last prescribed these meds in May and was given a 30 day supply with no refills. He notes that he started noticing scrotal swelling and lower extremity swelling that started about 3 days ago. He also complains of fatigue and dyspnea on exertion, most notably when walking up stairs. Today all of his symptoms worsened and the scrotal swelling got so bad, that the patient was having significant pain in his scrotum and came to the ED. In addition to the above, he endorses blurry vision that he first noticed a week or two ago. The patient notes that this happens whenever his blood pressure gets "really high". Denies any headache, but does also endorse some lightheadedness and feelings that he may pass out.    In the ED, SBP consistently in the 200-220s. Initial troponin 36. BMP with K of 5.3, BUN 68, Cr 6.1 (baseline around 5), and GFR 10. Received 1 dose of Lokelma. BNP elevated <2800. CBC with Hb 5.5, no leukocytosis. Transfused with 2u PRBCs in the ED.   Meds:  Current Meds  Medication Sig   amLODipine (NORVASC) 10 MG tablet Take 1 tablet (10 mg total) by mouth daily.   carvedilol (COREG) 12.5 MG tablet Take 1 tablet (12.5 mg total) by mouth 2 (two) times daily with a meal.   ferrous sulfate 325 (65 FE) MG tablet Take 1 tablet (325 mg total) by mouth daily with breakfast.   furosemide (LASIX) 40 MG tablet Take 1 tablet (40 mg total) by mouth 2 (two) times daily.   isosorbide mononitrate (IMDUR) 30 MG 24 hr tablet Take 1 tablet (30 mg total) by mouth daily.   pantoprazole (PROTONIX) 40 MG tablet Take 1 tablet (40 mg total) by mouth daily.     Allergies: Allergies as of 02/07/2021   (No Known Allergies)   Past Medical History:  Diagnosis Date   Acute on chronic kidney failure ([place])    Anemia 04/04/2020   Blood transfusion without reported diagnosis    Diabetes mellitus without complication (HCC)    GI bleed    H. pylori infection    Hemorrhoids  Hyperplastic colon polyp    Hypertension     Family History: Significant history of diabetes and HTN in his family; no history of CKD  Social History: Lives by himself in [place]; able to complete his ADLs, however, he notes that he has become "more tired" of caring for himself and has spoken with his daughters about this. Daughters intermittently check up on him. Works at [employer], patient is ready to retire Denies any alcohol use Smokes ~0.25 pack of cigarettes/day  Review of Systems: A complete ROS was negative except as per HPI.   Physical Exam: Blood pressure (!) 204/85, pulse 66, temperature 97.7 F (36.5 C), temperature source Oral, resp. rate 18, height '5\' 7"'$  (1.702 m), weight 79.8 kg, SpO2 100 %. General:  Alert, cooperative, well-appearing male laying in bed. No acute distress. Head: Normocephalic. Atraumatic. Eyes: PERRL, patient is blind in his left eye. Vision is stable, although patient endorses blurry vision  CV: RRR. No murmurs, rubs, or gallops. No LE edema Pulmonary: Lungs CTAB. Normal effort. No wheezing or rales. Abdominal: Soft, nontender, nondistended. Normal bowel sounds. Extremities: Extremities are cool and dry with significant color changes noted (worse on the right) likely secondary to ecchymosis vs venous stasis. palpable radial and DP pulses. Normal ROM. 1-2+ pitting edema bilateral lower extremities GU: Significant scrotal edema, significant pain in scrotum Skin: Warm and dry. No obvious rash or lesions. Neuro: A&Ox3. Moves all extremities. Normal sensation. No focal deficit. Psych: Normal mood and affect   EKG: personally reviewed my interpretation is normal sinus rhythm with some T wave inversions noted in leads I, aVL  CXR: personally reviewed my interpretation is cardiomegaly with some pulmonary vascular congestion noted  Assessment & Plan by Problem: Active Problems:   Symptomatic anemia   Hypertensive emergency   Acute kidney injury ([place])   Bilateral lower extremity edema   Acute on chronic combined systolic and diastolic congestive heart failure (HCC)   Acute on chronic renal failure (HCC)  #Severe symptomatic hypertension  Patient reports that he has been taking amlodipine, lasix, and carvedilol every few days when he remembers to take them. He has been admitted with severe symptomatic hypertension multiple times, and has not followed up with a PCP on discharge. Today he is presenting with worsening dyspnea on exertion, blurry vision, and feeling like he is going to pass out. SBP in the 200s-220s in the ED. Did receive 1 dose of labetalol '20mg'$ . CT head negative for any intracranial abnormality.  - Resumed home amlodipine 10, carvedilol 12.5 - Hydralazine '10mg'$   q4h PRN for SBP >180 - Started on Imdur '30mg'$  daily  - Cardiac monitoring   #Acute on chronic diastolic heart failure Echo from 10/2020 showed EF of 65-70% and normal LV function; severe LVH noted and grade II diastolic dysfunction noted. Patient is now presenting with 3 days of worsening scrotal edema and lower extremity edema. Patient is on lasix at home, but reportedly only takes this every few days. Swelling worsened on the day of admission and the patient was having significant pain associated with his scrotal edema. BNP elevated at >2800 (was reported to be >4500 on last admission in May). CXR in the ED showed cardiomegaly with some pulmonary vascular congestion.  - Diuresis with '80mg'$  IV lasix q6h per nephro - Echo ordered - Strict I&Os, daily weights - Fluid restriction  #Scrotal edema Scrotal edema is likely secondary to acute heart failure exacerbation, as he has presented with this on his previous admissions. Patient is now  having significant pain in his scrotum. Denies any urinary complains- no dysuria, hematuria, frequency, or urgency. Had ultrasound of scrotum in May that showed no evidence of epididymo-orchitis or any testicular mass lesion. A small to moderate hydrocele was noted bilaterally and a small epididymal cyst/spermatocele.  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 Was offered vascular access placement for dialysis during his last admission, however, the patient declined. Cr elevated to 6.1 this admission, up from his baseline of ~5. Nephrology was consulted in the ED.  - Nephrology consulted, appreciate recommendations - Consulted IR for [facility] and [facility] for vascular access - Will need outpatient dialysis arrangements - Follow up renal function  - Follow up phos, PTH, vit D  #Symptomatic anemia #Anemia of chronic disease likely secondary to chronic renal  failure Patient has required multiple transfusions during his previous admissions. Hb 5.5 in the ED and was transfused with 2 units of PRBCs. Had normal EGD in 08/2020 and colonoscopy at the time showed 72m polyp that was removed in transverse colon.  - Follow up hemoccult test  - Follow up post transfusion CBC - Will likely need ESA and IV iron  - Protonix '40mg'$  daily   #Hyperkalemia K 5.3 on admission, likely in the setting of CKD. Received 1 dose of Lokelma in the ED. - Started on Lasix '80mg'$  IV q6 per nephro  - Lokelma packet 10g daily  - Follow up BMP  #Venous stasis ulcers vs ecchymosis Bilateral lower extremities are cool to the touch with faint distal pulses. Has areas of significant discoloration of his lower extremities, most notably on his right lower extremity. Could be secondary to venous stasis ulcers vs ecchymosis, although the patient is not on any blood thinners at this time. Patient denies any history of DVTs and has had no recent travel.  - Vascular [place]   Best practices: Diet: Renal with fluid restriction IVF: None VTE: SCDs Code: Full Dispo: Admit patient to Observation with expected length of stay less than 2 midnights.  Signed: ADorethea Clan DO 02/07/2021, 6:20 PM  Pager: '@[email or web address]'$ @ After 5pm on weekdays and 1pm on weekends: On Call pager: [phone]

## 2021-02-07 NOTE — ED Triage Notes (Signed)
Pt here d/t testicle and penis swelling and pain. Swelling noted in lower extremities X3 days. Abdominal swelling noted. Change in vision X1 day. Pt reports intermittent blurred vision. 10/10 pain noted.

## 2021-02-07 NOTE — ED Provider Notes (Signed)
Emergency Medicine Provider Triage Evaluation Note  Jeffrey Zuniga , a 65 y.o. male  was evaluated in triage.  Pt complains of ble edema and scrotal swelling x4 days. Also c/o right eye blurred vision ongoing for 24 hours.  Review of Systems  Positive: Ble edema, scrotal swelling, blurred vision Negative: Weakness, numbness  Physical Exam  BP (!) 200/84 (BP Location: Left Arm)   Pulse 79   Temp 98 F (36.7 C) (Oral)   Resp 16   Ht '5\' 7"'$  (1.702 m)   Wt 79.8 kg   SpO2 100%   BMI 27.57 kg/m  Gen:   Awake, no distress   Resp:  Normal effort  MSK:   Moves extremities without difficulty  Other:  Ble edema, scrotal edema and abdominal edema. No cellulitis to scrotum. No unilateral numbness/weakness or facial droop. perrl  Medical Decision Making  Medically screening exam initiated at 12:39 PM.  Appropriate orders placed.  Jeffrey Zuniga was informed that the remainder of the evaluation will be completed by another provider, this initial triage assessment does not replace that evaluation, and the importance of remaining in the ED until their evaluation is complete.  Pt does not meet criteria for tpa. Van neg.   Bishop Dublin 02/07/21 1241    Truddie Hidden, MD 02/07/21 [phone]

## 2021-02-07 NOTE — ED Provider Notes (Signed)
[facility] EMERGENCY DEPARTMENT Provider Note  CSN: [id number] Arrival date & time: 02/07/21 1158    History Chief Complaint  Patient presents with   Groin Swelling    hypertention    Jeffrey Zuniga is a 65 y.o. male with history of HTN, CKD not on dialysis and anemia presents for evaluation of scrotal and LE edema, general weakness, DOE and blurry vision. He has been admitted for similar in the past and discharged on HTN meds but then lost to follow up. He reports compliance with his medications and no missed doses but last admission was 5/20, given 30 day supply at discharge with no refills and he denies having seen anyone since then. He denies any fever or chest pains.    Past Medical History:  Diagnosis Date   Acute on chronic kidney failure ([place])    Anemia 04/04/2020   Blood transfusion without reported diagnosis    Diabetes mellitus without complication (HCC)    GI bleed    H. pylori infection    Hemorrhoids    Hyperplastic colon polyp    Hypertension     Past Surgical History:  Procedure Laterality Date   BIOPSY  08/31/2020   Procedure: BIOPSY;  Surgeon: Irving Copas., MD;  Location: [facility];  Service: Gastroenterology;;   CARDIAC CATHETERIZATION     COLONOSCOPY WITH PROPOFOL N/A 08/31/2020   Procedure: COLONOSCOPY WITH PROPOFOL;  Surgeon: Irving Copas., MD;  Location: [facility];  Service: Gastroenterology;  Laterality: N/A;   ESOPHAGOGASTRODUODENOSCOPY (EGD) WITH PROPOFOL N/A 08/31/2020   Procedure: ESOPHAGOGASTRODUODENOSCOPY (EGD) WITH PROPOFOL;  Surgeon: Rush Landmark Telford Nab., MD;  Location: [place];  Service: Gastroenterology;  Laterality: N/A;   POLYPECTOMY  08/31/2020   Procedure: POLYPECTOMY;  Surgeon: Rush Landmark Telford Nab., MD;  Location: [place];  Service: Gastroenterology;;    No family history on file.  Social History   Tobacco Use   Smoking status: Every Day    Packs/day: 0.25    Types: Cigarettes   Smokeless  tobacco: Never   Tobacco comments:    2021  " i AM CUTTING BACK :"  Vaping Use   Vaping Use: Never used  Substance Use Topics   Alcohol use: Not Currently   Drug use: Never     Home Medications Prior to Admission medications   Medication Sig Start Date End Date Taking? Authorizing Provider  amLODipine (NORVASC) 10 MG tablet Take 1 tablet (10 mg total) by mouth daily. 10/30/20   Sanjuan Dame, MD  Amoxicill-Rifabutin-Omeprazole (TALICIA) 99991111 MG CPDR Take 4 tablets by mouth every 8 (eight) hours. 09/09/20   Pyrtle, Lajuan Lines, MD  carvedilol (COREG) 12.5 MG tablet Take 1 tablet (12.5 mg total) by mouth 2 (two) times daily with a meal. 10/30/20 11/29/20  Sanjuan Dame, MD  ferrous sulfate 325 (65 FE) MG tablet Take 1 tablet (325 mg total) by mouth daily with breakfast. 10/30/20   Sanjuan Dame, MD  furosemide (LASIX) 40 MG tablet Take 1 tablet (40 mg total) by mouth 2 (two) times daily. 10/30/20 11/29/20  Sanjuan Dame, MD  isosorbide mononitrate (IMDUR) 30 MG 24 hr tablet Take 1 tablet (30 mg total) by mouth daily. 10/30/20   Sanjuan Dame, MD  pantoprazole (PROTONIX) 40 MG tablet Take 1 tablet (40 mg total) by mouth daily. 10/31/20   Sanjuan Dame, MD     Allergies    Patient has no known allergies.   Review of Systems   Review of Systems A comprehensive review of systems was  completed and negative except as noted in HPI.    Physical Exam BP (!) 194/88   Pulse 67   Temp 98.2 F (36.8 C) (Oral)   Resp 18   Ht '5\' 7"'$  (1.702 m)   Wt 79.8 kg   SpO2 100%   BMI 27.57 kg/m   Physical Exam Vitals and nursing note reviewed.  Constitutional:      Appearance: Normal appearance.  HENT:     Head: Normocephalic and atraumatic.     Nose: Nose normal.     Mouth/Throat:     Mouth: Mucous membranes are moist.  Eyes:     Extraocular Movements: Extraocular movements intact.     Conjunctiva/sclera: Conjunctivae normal.  Cardiovascular:     Rate and Rhythm: Normal  rate.  Pulmonary:     Effort: Pulmonary effort is normal.     Breath sounds: Normal breath sounds.  Abdominal:     General: Abdomen is flat.     Palpations: Abdomen is soft.     Tenderness: There is no abdominal tenderness.  Genitourinary:    Comments: Marked scrotal and penile edema, tender diffusely, unable to retract foreskin due to swelling Musculoskeletal:        General: No swelling. Normal range of motion.     Cervical back: Neck supple.     Right lower leg: Edema present.     Left lower leg: Edema present.  Skin:    General: Skin is warm and dry.  Neurological:     General: No focal deficit present.     Mental Status: He is alert.  Psychiatric:        Mood and Affect: Mood normal.     ED Results / Procedures / Treatments   Labs (all labs ordered are listed, but only abnormal results are displayed) Labs Reviewed  BASIC METABOLIC PANEL - Abnormal; Notable for the following components:      Result Value   Potassium 5.3 (*)    Chloride 113 (*)    CO2 16 (*)    Glucose, Bld 115 (*)    BUN 68 (*)    Creatinine, Ser 6.13 (*)    Calcium 7.8 (*)    GFR, Estimated 10 (*)    All other components within normal limits  CBC - Abnormal; Notable for the following components:   RBC 2.12 (*)    Hemoglobin 5.5 (*)    HCT 18.9 (*)    MCH 25.9 (*)    MCHC 29.1 (*)    RDW 18.5 (*)    All other components within normal limits  BRAIN NATRIURETIC PEPTIDE - Abnormal; Notable for the following components:   B Natriuretic Peptide 2,851.9 (*)    All other components within normal limits  TROPONIN I (HIGH SENSITIVITY) - Abnormal; Notable for the following components:   Troponin I (High Sensitivity) 36 (*)    All other components within normal limits  RESP PANEL BY RT-PCR (FLU A&B, COVID) ARPGX2  TYPE AND SCREEN  PREPARE RBC (CROSSMATCH)  TROPONIN I (HIGH SENSITIVITY)    EKG EKG Interpretation  Date/Time:  Tuesday February 07 2021 12:22:55 EDT Ventricular Rate:  84 PR  Interval:  188 QRS Duration: 88 QT Interval:  388 QTC Calculation: 458 R Axis:   38 Text Interpretation: Normal sinus rhythm T wave abnormality, consider lateral ischemia Abnormal ECG No significant change since last tracing Confirmed by Calvert Cantor [phone]) on 02/07/2021 3:39:10 PM  Radiology DG Chest 2 View  Result Date: 02/07/2021 CLINICAL DATA:  History of diabetes and hypertension at age 23 now with bilateral lower extremity edema and leg swelling for 3 days. EXAM: CHEST - 2 VIEW COMPARISON:  Oct 28, 2020. FINDINGS: Findings trachea midline. Heart size remains enlarged. Central pulmonary structures and hilar structures are mildly engorged. No lobar consolidative process or sign of pleural effusion. No pneumothorax. On limited assessment no acute skeletal process. IMPRESSION: Cardiomegaly with mild central pulmonary vascular congestion. Electronically Signed   By: Zetta Bills M.D.   On: 02/07/2021 13:40   CT HEAD WO CONTRAST (5MM)  Result Date: 02/07/2021 CLINICAL DATA:  65 year old male with unilateral blurred vision by report. EXAM: CT HEAD WITHOUT CONTRAST TECHNIQUE: Contiguous axial images were obtained from the base of the skull through the vertex without intravenous contrast. COMPARISON:  None FINDINGS: Brain: No evidence of acute infarction, hemorrhage, hydrocephalus, extra-axial collection or mass lesion/mass effect. Vascular: No hyperdense vessel or unexpected calcification. Skull: Normal. Negative for fracture or focal lesion. Sinuses/Orbits: Visualized paranasal sinuses and orbits are unremarkable. Other: None IMPRESSION: No acute intracranial abnormality. Electronically Signed   By: Zetta Bills M.D.   On: 02/07/2021 14:19    Procedures .Critical Care  Date/Time: 02/07/2021 4:03 PM Performed by: Truddie Hidden, MD Authorized by: Truddie Hidden, MD   Critical care provider statement:    Critical care time (minutes):  45   Critical care time was exclusive of:   Separately billable procedures and treating other patients   Critical care was necessary to treat or prevent imminent or life-threatening deterioration of the following conditions:  Circulatory failure and renal failure   Critical care was time spent personally by me on the following activities:  Discussions with consultants, evaluation of patient's response to treatment, examination of patient, ordering and performing treatments and interventions, ordering and review of laboratory studies, ordering and review of radiographic studies, pulse oximetry, re-evaluation of patient's condition, obtaining history from patient or surrogate and review of old charts   Care discussed with: admitting provider    Medications Ordered in the ED Medications  0.9 %  sodium chloride infusion (has no administration in time range)  labetalol (NORMODYNE) injection 20 mg (20 mg Intravenous Given 02/07/21 1539)  fentaNYL (SUBLIMAZE) injection 50 mcg (50 mcg Intravenous Given 02/07/21 1539)     MDM Rules/Calculators/A&P MDM Patient with exacerbation of his chronic HTN has scrotal edema and worsening of his kidney function. Also anemic with Hgb 5.5 in need of transfusion. Will discuss with Nephrology, has been told in the past he would likely need dialysis. Admit to unassigned.   ED Course  I have reviewed the triage vital signs and the nursing notes.  Pertinent labs & imaging results that were available during my care of the patient were reviewed by me and considered in my medical decision making (see chart for details).  Clinical Course as of 02/07/21 1604  Tue Feb 07, 2021  1551 Spoke with Dr. Marval Regal, Nephrology, who will evaluate the patient.  [CS]  22 Spoke with IM Resident who will evaluate for admission.  [CS]    Clinical Course User Index [CS] Truddie Hidden, MD    Final Clinical Impression(s) / ED Diagnoses Final diagnoses:  Accelerated hypertension  AKI (acute kidney injury) ([place])  Anemia  associated with chronic renal failure  Scrotal edema    Rx / DC Orders ED Discharge Orders     None        Truddie Hidden, MD 02/07/21 [phone]

## 2021-02-07 NOTE — Consult Note (Addendum)
Reason for Consult: AKI/CKD stage V Referring Physician: Daryll Drown, MD  Jeffrey Zuniga is an 65 y.o. 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  He had a similar presentation in May of this year.  He had been referred to our practice, however he never showed up for any of his appointments even after his last hospitalization.  In the ED he was noted to be hypertensive with SBP's in the 190's-200's.  SpO2 was 100% on room air.  Labs were notable for K 5.3, BUN 68, Scr 6.13, Ca 7.8, Hgb 5.5, BNP 2852.  ECG with NSR and possible lateral ischemia.  CXR showed cardiomegaly with mild pulmonary vascular congestion.  CT scan of head was negative for intracranial abnormality.  He is being admitted with hypertensive urgency, and we were consulted to further evaluate and manage his AKI/CKD stage V.  He denies any N/V, dysgeusia, anorexia, or unintentional weight loss.  He also denies any CP, palpitations, SOB, but does have DOE.  He does admit that he has not been taking his medications like he should or seeing doctors.  The trend in his Scr is seen below.  Trend in Creatinine: Creatinine, Ser  Date/Time Value Ref Range Status  02/07/2021 12:50 PM 6.13 (H) 0.61 - 1.24 mg/dL Final  10/30/2020 02:09 AM 5.16 (H) 0.61 - 1.24 mg/dL Final  10/29/2020 02:48 AM 5.40 (H) 0.61 - 1.24 mg/dL Final  10/28/2020 06:19 AM 5.15 (H) 0.61 - 1.24 mg/dL Final  09/01/2020 01:34 AM 5.16 (H) 0.61 - 1.24 mg/dL Final  08/31/2020 01:48 AM 4.87 (H) 0.61 - 1.24 mg/dL Final  08/30/2020 02:01 AM 4.84 (H) 0.61 - 1.24 mg/dL Final  08/29/2020 01:38 PM 4.89 (H) 0.61 - 1.24 mg/dL Final  08/29/2020 07:55 AM 5.01 (H) 0.61 - 1.24 mg/dL Final  04/05/2020 12:40 AM 3.92 (H) 0.61 - 1.24 mg/dL Final  04/04/2020 03:13 AM 3.69  (H) 0.61 - 1.24 mg/dL Final  04/03/2020 09:37 AM 3.67 (H) 0.61 - 1.24 mg/dL Final  04/02/2020 02:30 AM 3.70 (H) 0.61 - 1.24 mg/dL Final  04/01/2020 03:58 PM 4.12 (H) 0.61 - 1.24 mg/dL Final    PMH:   Past Medical History:  Diagnosis Date   Acute on chronic kidney failure ([place])    Anemia 04/04/2020   Blood transfusion without reported diagnosis    Diabetes mellitus without complication (HCC)    GI bleed    H. pylori infection    Hemorrhoids    Hyperplastic colon polyp    Hypertension     PSH:   Past Surgical History:  Procedure Laterality Date   BIOPSY  08/31/2020   Procedure: BIOPSY;  Surgeon: Irving Copas., MD;  Location: [facility] ENDOSCOPY;  Service: Gastroenterology;;   CARDIAC CATHETERIZATION     COLONOSCOPY WITH PROPOFOL N/A 08/31/2020   Procedure: COLONOSCOPY WITH PROPOFOL;  Surgeon: Irving Copas., MD;  Location[facility] ENDOSCOPY;  Service: Gastroenterology;  Laterality: N/A;   ESOPHAGOGASTRODUODENOSCOPY (EGD) WITH PROPOFOL N/A 08/31/2020   Procedure: ESOPHAGOGASTRODUODENOSCOPY (EGD) WITH PROPOFOL;  Surgeon: Rush Landmark Telford Nab., MD;  Location: [place];  Service: Gastroenterology;  Laterality: N/A;   POLYPECTOMY  08/31/2020   Procedure: POLYPECTOMY;  Surgeon: Rush Landmark Telford Nab., MD;  Location[facility] END[facility]COPY;  Service: Gastroenterology;;    Allergies: No Known Allergies  Medications:  Prior to Admission medications   Medication Sig Start Date End Date Taking? Authorizing Provider  amLODipine (NORVASC) 10 MG tablet Take 1 tablet (10 mg total) by mouth daily. 10/30/20  Yes Sanjuan Dame, MD  carvedilol (COREG) 12.5 MG tablet Take 1 tablet (12.5 mg total) by mouth 2 (two) times daily with a meal. 10/30/20 02/07/21 Yes Sanjuan Dame, MD  ferrous sulfate 325 (65 FE) MG tablet Take 1 tablet (325 mg total) by mouth daily with breakfast. 10/30/20  Yes Sanjuan Dame, MD  furosemide (LASIX) 40 MG tablet Take 1 tablet (40 mg total) by mouth 2 (two) times  daily. 10/30/20 02/07/21 Yes Sanjuan Dame, MD  isosorbide mononitrate (IMDUR) 30 MG 24 hr tablet Take 1 tablet (30 mg total) by mouth daily. 10/30/20  Yes Sanjuan Dame, MD  pantoprazole (PROTONIX) 40 MG tablet Take 1 tablet (40 mg total) by mouth daily. 10/31/20  Yes Sanjuan Dame, MD  Amoxicill-Rifabutin-Omeprazole (TALICIA) 99991111 MG CPDR Take 4 tablets by mouth every 8 (eight) hours. Patient not taking: Reported on 02/07/2021 09/09/20   Pyrtle, Lajuan Lines, MD    Inpatient medications:   Discontinued Meds:  There are no discontinued medications.  Social History:  reports that he has been smoking cigarettes. He has been smoking an average of .25 packs per day. He has never used smokeless tobacco. He reports that he does not currently use alcohol. He reports that he does not use drugs.  Family History:  No family history on file.  Pertinent items are noted in HPI. Weight change:  No intake or output data in the 24 hours ending 02/07/21 1654 BP (!) 192/93   Pulse 65   Temp 98.2 F (36.8 C) (Oral)   Resp 17   Ht '5\' 7"'$  (1.702 m)   Wt 79.8 kg   SpO2 100%   BMI 27.57 kg/m  Vitals:   02/07/21 1552 02/07/21 1555 02/07/21 1600 02/07/21 1615  BP: (!) 194/88  (!) 202/98 (!) 192/93  Pulse: 63 67 76 65  Resp:  '18 17 17  '$ Temp:      TempSrc:      SpO2: 100% 100% 99% 100%  Weight:      Height:         General appearance: alert, cooperative, and no distress Head: Normocephalic, without obvious abnormality, atraumatic Eyes: negative findings: lids and lashes normal, conjunctivae and sclerae normal, and corneas clear Resp: clear to auscultation bilaterally Cardio: regular rate and rhythm, S1, S2 normal, no murmur, click, rub or gallop GI: soft, non-tender; bowel sounds normal; no masses,  no organomegaly Extremities: edema 1+ pitting edema bilateral lower extremities and scrotal edema  Labs: Basic Metabolic Panel: Recent Labs  Lab 02/07/21 1250  NA 137  K 5.3*  CL 113*   CO2 16*  GLUCOSE 115*  BUN 68*  CREATININE 6.13*  CALCIUM 7.8*   Liver Function Tests: No results for input(s): AST, ALT, ALKPHOS, BILITOT, PROT, ALBUMIN in the last 168 hours. No results for input(s): LIPASE, AMYLASE in the last 168 hours. No results for input(s): AMMONIA in the last 168 hours. CBC: Recent Labs  Lab 02/07/21 1250  WBC 5.0  HGB 5.5*  HCT 18.9*  MCV 89.2  PLT 156   PT/INR: '@LABRCNTIP'$ (inr:5) Cardiac Enzymes: )No results for input(s): CKTOTAL, CKMB, CKMBINDEX, TROPONINI in the last 168 hours. CBG: No results for input(s): GLUCAP in the last 168 hours.  Iron Studies: No results for input(s): IRON, TIBC, TRANSFERRIN, FERRITIN in the last 168 hours.  Xrays/Other Studies:  DG Chest 2 View  Result Date: 02/07/2021 CLINICAL DATA:  History of diabetes and hypertension at age 29 now with bilateral lower extremity edema and leg swelling for 3 days. EXAM: CHEST - 2 VIEW COMPARISON:  Oct 28, 2020. FINDINGS: Findings trachea midline. Heart size remains enlarged. Central pulmonary structures and hilar structures are mildly engorged. No lobar consolidative process or sign of pleural effusion. No pneumothorax. On limited assessment no acute skeletal process. IMPRESSION: Cardiomegaly with mild central pulmonary vascular congestion. Electronically Signed   By: Zetta Bills M.D.   On: 02/07/2021 13:40   CT HEAD WO CONTRAST (5MM)  Result Date: 02/07/2021 CLINICAL DATA:  65 year old male with unilateral blurred vision by report. EXAM: CT HEAD WITHOUT CONTRAST TECHNIQUE: Contiguous axial images were obtained from the base of the skull through the vertex without intravenous contrast. COMPARISON:  None FINDINGS: Brain: No evidence of acute infarction, hemorrhage, hydrocephalus, extra-axial collection or mass lesion/mass effect. Vascular: No hyperdense vessel or unexpected calcification. Skull: Normal. Negative for fracture or focal lesion. Sinuses/Orbits: Visualized paranasal sinuses  and orbits are unremarkable. Other: None IMPRESSION: No acute intracranial abnormality. Electronically Signed   By: Zetta Bills M.D.   On: 02/07/2021 14:19     Assessment/Plan:  AKI/CKD stage V - presumably due to poorly controlled HTN.  Pt has had poor compliance with follow up and declined vascular access placement at his last admission in May but understands that he is nearing the need to start dialysis.  Will consult IR for [facility] and [facility] for vascular access placement.  Will also start educating pt on dialysis and will need outpatient dialysis arrangements made. Hypertensive urgency-  resume meds and follow response.  Hopefully will respond and not need transfer to ICU. Acute on chronic diastolic chf - will start IV lasix 80 mg q6 and follow response. Symptomatic anemia - hgb down to 5.5.  Need to guaiac stools given his history of GIB in the past.  Transfuse per primary.  Likely has component of CKD and will need ESA and possible IV iron. Metabolic acidosis - due to CKD.  Hold off on bicarb for now given edema and follow with IV lasix Proteinuria - pt noted to have nephrotic range proteinuria in the past, however renal US with echogenic kidneys and given advanced CKD did not feel biopsy would change his trajectory.  Hyperkalemia - will start IV lasix and follow.  Will also give lokelma. CKD-BMD- low calcium, will check phos and iPTH and treat accordingly.  Disposition - we discussed the severity and chronicity of his CKD and need for better compliance with meds and follow up.  I also stressed the importance to be committed in dialysis moving forward.  He acknowledged understanding and admitted that "I have to do better".    Governor Rooks Louvenia Golomb 02/07/2021, 4:54 PM

## 2021-02-07 NOTE — Hospital Course (Addendum)
#AKI on CKD stage V, now ESRD requiring HD CKD likely secondary to poorly controlled HTN. Cr elevated to 6.1 this admission, up from his baseline of ~5. Patient had R sided tunneled dialysis catheter placed on 09/01 and L AV fistula creation on 09/02. HD sessions on 09/02, #2 09/03, #4 09/05, #4 09/07.    #Hypertensive emergency, resolved Patient presented with worsening dyspnea on exertion, blurry vision, and feeling like he is going to pass out. SBP in the 200s-220s in the ED. He has poor adherence to reported regimen of amlodipine, lasix, and carvedilol. CT head negative for any intracranial abnormality. Restarted on home amlodipine 10, carvedilol 12.5, and started on Imdur '30mg'$  daily. Imdur was increased to 60 mg daily on 09/05 due to continued hypertension. He has maintained SBP in 150 through admission.   #Acute on chronic diastolic heart failure Patient presented with 3 days of worsening scrotal edema and lower extremity edema. Patient is on lasix at home, but admits to poor adherence. Swelling worsened on the day of admission and the patient was having significant pain associated with his scrotal edema. BNP elevated at >2800 (was reported to be >4500 on last admission in May). CXR in the ED showed cardiomegaly with some pulmonary vascular congestion. Echo from 10/2020 showed EF of 65-70% and normal LV function; severe LVH noted and grade II diastolic dysfunction noted.Started on diuresis with '80mg'$  IV lasix q6h per nephro on 8/30 although urine output was not recorded. This was increased to 120 mg q6h 08/31. Echo on 8/31 showed EF of 55-60%, severe LVH, and grade II diastolic dysfunction. HD initiated 09/02 at which point lasix was discontinued. Patient continued to be normovolemic throughout stay with HD. Patient is net -6L.    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 4 T's score is 5-intermediate risk- due to platelet count decreased by 30-50% (1 point), clear onset (2 points), no apparent other causes (2 points). Pharmacy was consulted for heparin alternative and recommended no need for current anticoagulation. HIT ab pending.     #Subacute Vision Loss, OD #Reported hx of Glaucoma, OS Patient reported a history of glaucoma in his left eye for which he is prescribed some eye drops (no records in our system). He reports new vision loss in his right eye that has been worsening over the past week and a half. On exam his pupils are equal and reactive bilaterally. He reports some pain in his left eye but there is no conjunctival injection. Visual acuity is light perception only on the left and 20/200 on the right without glasses. This improved to 20/50 on the R with glasses.   #Symptomatic anemia #Anemia of chronic disease likely secondary to chronic renal failure Patient presnted with Hgb 5.5 in the ED and was transfused with 2 units of PRBCs. Had normal EGD in 08/2020 and colonoscopy at the time showed 14m polyp that was removed in transverse colon. He received darbepoetin alfa 40 mcg IV and ferric gluconate 250 mg IV on dialysis days during admission. Hgb remained stable throughout admission, with Hgb on day of discharge of 8.1.   #Hyperkalemia K 5.3 on admission, likely in the setting of CKD. Received 1 dose of Lokelma in the ED and K improved to 4.6 on 8/31. Remained normokalemic through admission not requiring Lokelma.   #Scrotal edema Scrotal edema likely secondary to acute  heart failure exacerbation, as he has presented with this on his previous admissions. Patient presented with the complaint of severe scrotal pain. Denied any urinary complains- no dysuria, hematuria, frequency, or urgency. Had ultrasound of scrotum in May that showed no evidence of epididymo-orchitis or any testicular mass lesion. A small to  moderate hydrocele was noted bilaterally and a small epididymal cyst/spermatocele. US scrotum with doppler on 8/30 with no findings of testicular torsion/mass, but noted bilateral hydroceles slightly greater on the right. Diffuse bilateral microlithiasis also noted to be similar to previous ultrasound.    #Venous stasis ulcers vs ecchymosis Bilateral lower extremities were cool to the touch with faint distal pulses. Has areas of significant discoloration of his lower extremities, most notably on his right lower extremity. Patient denies any history of DVTs and has had no recent travel. ABI 08/31 unremarkable.

## 2021-02-08 ENCOUNTER — Observation Stay (HOSPITAL_COMMUNITY): Payer: 59

## 2021-02-08 ENCOUNTER — Inpatient Hospital Stay (HOSPITAL_COMMUNITY): Payer: 59

## 2021-02-08 DIAGNOSIS — B1920 Unspecified viral hepatitis C without hepatic coma: Secondary | ICD-10-CM | POA: Diagnosis present

## 2021-02-08 DIAGNOSIS — I5030 Unspecified diastolic (congestive) heart failure: Secondary | ICD-10-CM | POA: Diagnosis present

## 2021-02-08 DIAGNOSIS — N185 Chronic kidney disease, stage 5: Secondary | ICD-10-CM | POA: Diagnosis not present

## 2021-02-08 DIAGNOSIS — I5043 Acute on chronic combined systolic (congestive) and diastolic (congestive) heart failure: Secondary | ICD-10-CM | POA: Diagnosis present

## 2021-02-08 DIAGNOSIS — I161 Hypertensive emergency: Secondary | ICD-10-CM | POA: Diagnosis present

## 2021-02-08 DIAGNOSIS — Z23 Encounter for immunization: Secondary | ICD-10-CM | POA: Diagnosis not present

## 2021-02-08 DIAGNOSIS — Z20822 Contact with and (suspected) exposure to covid-19: Secondary | ICD-10-CM | POA: Diagnosis present

## 2021-02-08 DIAGNOSIS — N179 Acute kidney failure, unspecified: Secondary | ICD-10-CM | POA: Diagnosis present

## 2021-02-08 DIAGNOSIS — I878 Other specified disorders of veins: Secondary | ICD-10-CM | POA: Diagnosis present

## 2021-02-08 DIAGNOSIS — N189 Chronic kidney disease, unspecified: Secondary | ICD-10-CM | POA: Diagnosis not present

## 2021-02-08 DIAGNOSIS — D631 Anemia in chronic kidney disease: Secondary | ICD-10-CM | POA: Diagnosis present

## 2021-02-08 DIAGNOSIS — E1122 Type 2 diabetes mellitus with diabetic chronic kidney disease: Secondary | ICD-10-CM | POA: Diagnosis present

## 2021-02-08 DIAGNOSIS — F17210 Nicotine dependence, cigarettes, uncomplicated: Secondary | ICD-10-CM | POA: Diagnosis present

## 2021-02-08 DIAGNOSIS — N186 End stage renal disease: Secondary | ICD-10-CM | POA: Diagnosis present

## 2021-02-08 DIAGNOSIS — I739 Peripheral vascular disease, unspecified: Secondary | ICD-10-CM

## 2021-02-08 DIAGNOSIS — Z8249 Family history of ischemic heart disease and other diseases of the circulatory system: Secondary | ICD-10-CM | POA: Diagnosis not present

## 2021-02-08 DIAGNOSIS — N5089 Other specified disorders of the male genital organs: Secondary | ICD-10-CM | POA: Diagnosis present

## 2021-02-08 DIAGNOSIS — H409 Unspecified glaucoma: Secondary | ICD-10-CM | POA: Diagnosis present

## 2021-02-08 DIAGNOSIS — Z992 Dependence on renal dialysis: Secondary | ICD-10-CM | POA: Diagnosis not present

## 2021-02-08 DIAGNOSIS — I10 Essential (primary) hypertension: Secondary | ICD-10-CM | POA: Diagnosis not present

## 2021-02-08 DIAGNOSIS — Z79899 Other long term (current) drug therapy: Secondary | ICD-10-CM | POA: Diagnosis not present

## 2021-02-08 DIAGNOSIS — L309 Dermatitis, unspecified: Secondary | ICD-10-CM | POA: Diagnosis present

## 2021-02-08 DIAGNOSIS — D696 Thrombocytopenia, unspecified: Secondary | ICD-10-CM | POA: Diagnosis present

## 2021-02-08 DIAGNOSIS — E872 Acidosis: Secondary | ICD-10-CM | POA: Diagnosis present

## 2021-02-08 DIAGNOSIS — Z833 Family history of diabetes mellitus: Secondary | ICD-10-CM | POA: Diagnosis not present

## 2021-02-08 DIAGNOSIS — I132 Hypertensive heart and chronic kidney disease with heart failure and with stage 5 chronic kidney disease, or end stage renal disease: Secondary | ICD-10-CM | POA: Diagnosis present

## 2021-02-08 DIAGNOSIS — N503 Cyst of epididymis: Secondary | ICD-10-CM | POA: Diagnosis present

## 2021-02-08 DIAGNOSIS — N4341 Spermatocele of epididymis, single: Secondary | ICD-10-CM | POA: Diagnosis present

## 2021-02-08 DIAGNOSIS — E875 Hyperkalemia: Secondary | ICD-10-CM | POA: Diagnosis present

## 2021-02-08 DIAGNOSIS — H5461 Unqualified visual loss, right eye, normal vision left eye: Secondary | ICD-10-CM | POA: Diagnosis present

## 2021-02-08 DIAGNOSIS — N433 Hydrocele, unspecified: Secondary | ICD-10-CM | POA: Diagnosis present

## 2021-02-08 LAB — RENAL FUNCTION PANEL
Albumin: 2.6 g/dL — ABNORMAL LOW (ref 3.5–5.0)
Anion gap: 7 (ref 5–15)
BUN: 66 mg/dL — ABNORMAL HIGH (ref 8–23)
CO2: 17 mmol/L — ABNORMAL LOW (ref 22–32)
Calcium: 7.8 mg/dL — ABNORMAL LOW (ref 8.9–10.3)
Chloride: 114 mmol/L — ABNORMAL HIGH (ref 98–111)
Creatinine, Ser: 5.98 mg/dL — ABNORMAL HIGH (ref 0.61–1.24)
GFR, Estimated: 10 mL/min — ABNORMAL LOW (ref >60)
Glucose, Bld: 93 mg/dL (ref 70–99)
Phosphorus: 5.2 mg/dL — ABNORMAL HIGH (ref 2.5–4.6)
Potassium: 4.6 mmol/L (ref 3.5–5.1)
Sodium: 138 mmol/L (ref 135–145)

## 2021-02-08 LAB — VITAMIN D 25 HYDROXY (VIT D DEFICIENCY, FRACTURES): Vit D, 25-Hydroxy: 12.77 ng/mL — ABNORMAL LOW (ref 30–100)

## 2021-02-08 LAB — URINALYSIS, ROUTINE W REFLEX MICROSCOPIC
Bilirubin Urine: NEGATIVE
Glucose, UA: NEGATIVE mg/dL
Ketones, ur: NEGATIVE mg/dL
Leukocytes,Ua: NEGATIVE
Nitrite: NEGATIVE
Protein, ur: 100 mg/dL — AB
Specific Gravity, Urine: 1.012 (ref 1.005–1.030)
pH: 5 (ref 5.0–8.0)

## 2021-02-08 LAB — IRON AND TIBC
Iron: 38 ug/dL — ABNORMAL LOW (ref 45–182)
Saturation Ratios: 13 % — ABNORMAL LOW (ref 17.9–39.5)
TIBC: 298 ug/dL (ref 250–450)
UIBC: 260 ug/dL

## 2021-02-08 LAB — ECHOCARDIOGRAM COMPLETE
Area-P 1/2: 3.89 cm2
Calc EF: 48.9 %
Height: 67 in
S' Lateral: 3.6 cm
Single Plane A2C EF: 46.1 %
Single Plane A4C EF: 52.4 %
Weight: 2941.82 oz

## 2021-02-08 LAB — PREPARE RBC (CROSSMATCH)

## 2021-02-08 LAB — CBC
HCT: 21.5 % — ABNORMAL LOW (ref 39.0–52.0)
Hemoglobin: 6.8 g/dL — CL (ref 13.0–17.0)
MCH: 25.7 pg — ABNORMAL LOW (ref 26.0–34.0)
MCHC: 31.6 g/dL (ref 30.0–36.0)
MCV: 81.1 fL (ref 80.0–100.0)
Platelets: 145 10*3/uL — ABNORMAL LOW (ref 150–400)
RBC: 2.65 MIL/uL — ABNORMAL LOW (ref 4.22–5.81)
RDW: 18.9 % — ABNORMAL HIGH (ref 11.5–15.5)
WBC: 4.7 10*3/uL (ref 4.0–10.5)
nRBC: 0 % (ref 0.0–0.2)

## 2021-02-08 LAB — HEMOGLOBIN AND HEMATOCRIT, BLOOD
HCT: 23.5 % — ABNORMAL LOW (ref 39.0–52.0)
Hemoglobin: 7.5 g/dL — ABNORMAL LOW (ref 13.0–17.0)

## 2021-02-08 LAB — PROTIME-INR
INR: 1.3 — ABNORMAL HIGH (ref 0.8–1.2)
Prothrombin Time: 15.9 seconds — ABNORMAL HIGH (ref 11.4–15.2)

## 2021-02-08 LAB — FERRITIN: Ferritin: 23 ng/mL — ABNORMAL LOW (ref 24–336)

## 2021-02-08 LAB — APTT: aPTT: 34 seconds (ref 24–36)

## 2021-02-08 MED ORDER — SODIUM CHLORIDE 0.9% IV SOLUTION: Freq: Once | INTRAVENOUS | Status: DC

## 2021-02-08 MED ORDER — ACETAMINOPHEN 325 MG PO TABS
650.0000 mg | ORAL_TABLET | Freq: Four times a day (QID) | ORAL | Status: DC | PRN
  Administered 2021-02-08 – 2021-02-14 (×5): 650 mg via ORAL
  Filled 2021-02-08 (×5): qty 2

## 2021-02-08 MED ORDER — FUROSEMIDE 10 MG/ML IJ SOLN
120.0000 mg | Freq: Three times a day (TID) | INTRAVENOUS | Status: DC
  Administered 2021-02-08 – 2021-02-09 (×5): 120 mg via INTRAVENOUS
  Filled 2021-02-08: qty 10
  Filled 2021-02-08: qty 12
  Filled 2021-02-08: qty 10
  Filled 2021-02-08 (×2): qty 12
  Filled 2021-02-08: qty 10
  Filled 2021-02-08 (×3): qty 12

## 2021-02-08 NOTE — Progress Notes (Signed)
Heart Failure Navigator Progress Note  Assessed for Heart & Vascular TOC clinic readiness.  Patient does not meet criteria due to stage V, SCr 5+.   ECHO pending.   Pricilla Holm, MSN, RN Heart Failure Nurse Navigator [phone]

## 2021-02-08 NOTE — Progress Notes (Signed)
  Echocardiogram 2D Echocardiogram has been performed.  Jeffrey Zuniga 02/08/2021, 8:35 AM

## 2021-02-08 NOTE — Progress Notes (Signed)
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  Overnight Events: No acute events overnight.  Interim History: This is hospital day 1 for Jeffrey Zuniga who was seen and evaluated at the bedside this morning. He reports that he is feeling better overall. He does still endorse blurry vision, but denies any headaches, lightheadedness, or dizziness. He urinated more frequently yesterday with his lasix, although urine output was not recorded. Patient denies any dysuria or hematuria. His lower extremity edema and scrotal edema are improved today, and he does not report any further pain.   OBJECTIVE:  Vital Signs: Vitals:   02/08/21 0137 02/08/21 0433 02/08/21 0523 02/08/21 0549  BP: (!) 185/80 139/67 (!) 144/70 (!) 145/73  Pulse: 66 76 71 72  Resp: '18 18 18 18  '$ Temp: 98.2 F (36.8 C) 98.3 F (36.8 C) 98.5 F (36.9 C) 98.6 F (37 C)  TempSrc: Oral  Oral Oral  SpO2: 100% 98% 97% 100%  Weight: 83.4 kg     Height: '5\' 7"'$  (1.702 m)      Supplemental O2: Room Air SpO2: 100 %  Filed Weights   02/07/21 1233 02/08/21 0137  Weight: 79.8 kg 83.4 kg     Intake/Output Summary (Last 24 hours) at 02/08/2021 [id number] Last data filed at 02/08/2021 0600 Gross per 24 hour  Intake 523.33 ml  Output 760 ml  Net -236.67 ml   Net IO Since Admission: -236.67 mL [02/08/21 0739]  Physical Exam: General: Alert, cooperative, well-appearing male laying in bed. No acute distress. Head: Normocephalic. Atraumatic. Eyes: PERRL, patient is blind in his left eye. Vision is stable, although patient endorses blurry vision  CV: RRR. No murmurs, rubs, or gallops. +JVD Pulmonary: Lungs CTAB. Normal effort. No wheezing or rales. Abdominal: Soft, nontender, nondistended. Normal bowel  sounds. Extremities: Extremities are warm and dry with significant color changes noted (worse on the right) likely secondary to ecchymosis vs venous stasis. palpable radial and DP pulses. Normal ROM. 1+ pitting edema bilateral lower extremities GU: Significant scrotal edema, although improved  Skin: Warm and dry. No obvious rash or lesions. Neuro: A&Ox3. Moves all extremities. Normal sensation. No focal deficit. Psych: Normal mood and affect      ASSESSMENT/PLAN:  Assessment: Active Problems:   Symptomatic anemia   Hypertensive emergency   Acute kidney injury ([place])   Bilateral lower extremity edema   Acute on chronic combined systolic and diastolic congestive heart failure (HCC)   Acute on chronic renal failure (HCC)   Plan: #Severe symptomatic hypertension  Patient symptomatically is improved, although he continues to endorse dyspnea on exertion, blurry vision, and feeling like he is going to pass out. SBP in the 200s-220s in the ED. Did receive 1 dose of labetalol '20mg'$ . CT head negative for any intracranial abnormality.  - Resumed home amlodipine 10, carvedilol 12.5 - Hydralazine '10mg'$  q4h PRN for SBP >180 - Continue Imdur '30mg'$  daily  - Cardiac monitoring    #Acute on chronic diastolic heart failure Patient is now presented with 3 days of worsening scrotal edema and lower extremity edema. Patient is on lasix at home, but reportedly only takes this every few days. Swelling worsened on the day of admission and the patient was having significant pain associated with his scrotal edema. Swelling is improved this morning after  receiving IV diuresis. Echo performed today showed EF of 55-60% with no wall motion abnormalities, severe LVH and grade II diastolic dysfunction noted.  - Diuresis with '80mg'$  IV lasix q6h per nephro - Strict I&Os, daily weights - Fluid restriction   #Scrotal edema Scrotal edema is likely secondary to acute heart failure exacerbation, as he has presented with this  on his previous admissions. Patient is now having significant pain in his scrotum. Denies any urinary complains- no dysuria, hematuria, frequency, or urgency. US scrotum with doppler with no findings of testicular torsion/mass, but noted bilateral hydroceles slightly greater on the right. Diffuse bilateral microlithiasis also noted to be similar to previous ultrasound.  - Diuresis as above - Will check urinalysis  - Scrotal sling    #AKI on CKD stage V CKD likely secondary to poorly controlled HTN. Was offered vascular access placement for dialysis during his last admission, however, the patient declined. Cr elevated to 6 this admission, up from his baseline of ~5. - Nephrology consulted, appreciate recommendations - Consulted IR for [facility] and [facility] for vascular access - Will need outpatient dialysis arrangements - Dialysis education  - Follow up renal function  - Follow up phos, PTH, vit D   #Symptomatic anemia #Anemia of chronic disease likely secondary to chronic renal failure Patient has required multiple transfusions during his previous admissions. Hb 5.5 in the ED and was transfused with 2 units of PRBCs. Had normal EGD in 08/2020 and colonoscopy at the time showed 95m polyp that was removed in transverse colon. Hb 6.8 this morning and patient was transfused with another unit of blood/  - Follow up hemoccult test  - Follow up post transfusion CBC - Will likely need ESA and IV iron  - Protonix '40mg'$  daily    #Hyperkalemia K 5.3 on admission, likely in the setting of CKD. [place]ed on [place] K improved to 4.6 this morning.  - Continue Lasix '80mg'$  IV q6 per nephro  - Lokelma packet 10g daily, held today  - Follow up BMP   #Venous stasis ulcers vs ecchymosis Bilateral lower extremities with faint distal pulses. Has areas of significant discoloration of his lower extremities, most notably on his right lower extremity. Could be secondary to venous stasis ulcers vs ecchymosis, although the  patient is not on any blood thinners at this time.  - Vascular [place]  Best Practice: Diet: Renal diet IVF: Fluids: none VTE: SCDs Start: 02/07/21 1650 Code: Full AB: none DISPO: Anticipated discharge to Home pending  clinical improvement  .  Signature: RBuddy Duty D.O.  Internal Medicine Resident, PGY-1 [facility]  Pager: #[phone]:39 AM, 02/08/2021   Please contact the on call pager after 5 pm and on weekends at [phone]

## 2021-02-08 NOTE — Progress Notes (Signed)
ABI's have been completed. Preliminary results can be found in CV Proc through chart review.   02/08/21 1:49 PM Jeffrey Zuniga RVT

## 2021-02-08 NOTE — Progress Notes (Signed)
Patient ID: Jeffrey Zuniga, male   DOB: [birth year], 65 y.o.   MRN: [id number] S:Feels tired this morning but swelling has improved. O:BP (!) 173/85 (BP Location: Left Arm)   Pulse 73   Temp 97.8 F (36.6 C) (Oral)   Resp 18   Ht '5\' 7"'$  (1.702 m)   Wt 83.4 kg   SpO2 100%   BMI 28.80 kg/m   Intake/Output Summary (Last 24 hours) at 02/08/2021 1052 Last data filed at 02/08/2021 1004 Gross per 24 hour  Intake 1153.33 ml  Output 1360 ml  Net -206.67 ml   Intake/Output: I/O last 3 completed shifts: In: 523.3 [Blood:523.3] Out: 760 [Urine:760]  Intake/Output this shift:  Total I/O In: 630 [Blood:630] Out: 600 [Urine:600] Weight change:  Gen:NAD CVS: RRR Resp: CTA Abd: +BS, soft, NT/ND Ext: 1+ pitting edema, BLE and scrotum  Recent Labs  Lab 02/07/21 1250 02/08/21 0349  NA 137 138  K 5.3* 4.6  CL 113* 114*  CO2 16* 17*  GLUCOSE 115* 93  BUN 68* 66*  CREATININE 6.13* 5.98*  ALBUMIN  --  2.6*  CALCIUM 7.8* 7.8*  PHOS  --  5.2*   Liver Function Tests: Recent Labs  Lab 02/08/21 0349  ALBUMIN 2.6*   No results for input(s): LIPASE, AMYLASE in the last 168 hours. No results for input(s): AMMONIA in the last 168 hours. CBC: Recent Labs  Lab 02/07/21 1250 02/08/21 0349  WBC 5.0 4.7  HGB 5.5* 6.8*  HCT 18.9* 21.5*  MCV 89.2 81.1  PLT 156 145*   Cardiac Enzymes: No results for input(s): CKTOTAL, CKMB, CKMBINDEX, TROPONINI in the last 168 hours. CBG: No results for input(s): GLUCAP in the last 168 hours.  Iron Studies: No results for input(s): IRON, TIBC, TRANSFERRIN, FERRITIN in the last 72 hours. Studies/Results: DG Chest 2 View  Result Date: 02/07/2021 CLINICAL DATA:  History of diabetes and hypertension at age 22 now with bilateral lower extremity edema and leg swelling for 3 days. EXAM: CHEST - 2 VIEW COMPARISON:  Oct 28, 2020. FINDINGS: Findings trachea midline. Heart size remains enlarged. Central pulmonary structures and hilar structures are mildly engorged.  No lobar consolidative process or sign of pleural effusion. No pneumothorax. On limited assessment no acute skeletal process. IMPRESSION: Cardiomegaly with mild central pulmonary vascular congestion. Electronically Signed   By: Zetta Bills M.D.   On: 02/07/2021 13:40   CT HEAD WO CONTRAST (5MM)  Result Date: 02/07/2021 CLINICAL DATA:  65 year old male with unilateral blurred vision by report. EXAM: CT HEAD WITHOUT CONTRAST TECHNIQUE: Contiguous axial images were obtained from the base of the skull through the vertex without intravenous contrast. COMPARISON:  None FINDINGS: Brain: No evidence of acute infarction, hemorrhage, hydrocephalus, extra-axial collection or mass lesion/mass effect. Vascular: No hyperdense vessel or unexpected calcification. Skull: Normal. Negative for fracture or focal lesion. Sinuses/Orbits: Visualized paranasal sinuses and orbits are unremarkable. Other: None IMPRESSION: No acute intracranial abnormality. Electronically Signed   By: Zetta Bills M.D.   On: 02/07/2021 14:19   US SCROTUM W/DOPPLER  Result Date: 02/07/2021 CLINICAL DATA:  Testicular pain in a 65 year old male. EXAM: SCROTAL ULTRASOUND DOPPLER ULTRASOUND OF THE TESTICLES TECHNIQUE: Complete ultrasound examination of the testicles, epididymis, and other scrotal structures was performed. Color and spectral Doppler ultrasound were also utilized to evaluate blood flow to the testicles. COMPARISON:  Oct 28, 2020. FINDINGS: Right testicle Measurements: 3.9 x 2.8 x 2.4 cm. No visible mass, signs of abundant microlithiasis. Left testicle Measurements: 3.3 x 2.5 x 2.5  cm. No visible mass, signs of abundant microlithiasis. Right epididymis:  Normal in size and appearance. Left epididymis:  Normal in size and appearance. Hydrocele:  Bilateral hydroceles similar to the prior study. Varicocele:  None visualized. Pulsed Doppler interrogation of both testes demonstrates normal low resistance arterial and venous waveforms  bilaterally. IMPRESSION: No signs of testicular torsion or testicular mass. No findings to indicate epididymitis or orchitis. Bilateral hydroceles slightly greater on the RIGHT. Signs of scrotal thickening diffusely is largely similar to the prior study. Correlate with any signs of edema or cellulitis. 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 Current literature suggests that testicular microlithiasis is not a significant independent risk factor for development of testicular carcinoma, and that follow up imaging is not warranted in the absence of other risk factors. Monthly testicular self-examination and annual physical exams are considered appropriate surveillance. If patient has other risk factors for testicular carcinoma, then referral to Urology should be considered. (Reference: DeCastro, et al.: A 5-Year Follow up Study of Asymptomatic Men with Testicular Microlithiasis. J Urol 2008; [id number].) Electronically Signed   By: Zetta Bills M.D.   On: 02/07/2021 18:44    sodium chloride   Intravenous Once   amLODipine  10 mg Oral Daily   carvedilol  12.5 mg Oral BID WC   furosemide  80 mg Intravenous Q6H   isosorbide mononitrate  30 mg Oral Daily   pantoprazole  40 mg Oral Daily   sodium zirconium cyclosilicate  10 g Oral Daily    BMET    Component Value Date/Time   NA 138 02/08/2021 0349   K 4.6 02/08/2021 0349   CL 114 (H) 02/08/2021 0349   CO2 17 (L) 02/08/2021 0349   GLUCOSE 93 02/08/2021 0349   BUN 66 (H) 02/08/2021 0349   CREATININE 5.98 (H) 02/08/2021 0349   CALCIUM 7.8 (L) 02/08/2021 0349   CALCIUM 7.3 (L) 10/29/2020 1528   GFRNONAA 10 (L) 02/08/2021 0349   CBC    Component Value Date/Time   WBC 4.7 02/08/2021 0349   RBC 2.65 (L) 02/08/2021 0349   HGB 6.8 (LL) 02/08/2021 0349   HCT 21.5 (L) 02/08/2021 0349   PLT 145 (L) 02/08/2021 0349   MCV 81.1 02/08/2021 0349   MCH 25.7 (L) 02/08/2021 0349   MCHC 31.6 02/08/2021 0349   RDW 18.9 (H) 02/08/2021  0349   LYMPHSABS 0.6 (L) 10/29/2020 0248   MONOABS 0.6 10/29/2020 0248   EOSABS 0.2 10/29/2020 0248   BASOSABS 0.0 10/29/2020 0248    Assessment/Plan:  AKI/CKD stage V - presumably due to poorly controlled HTN.  Pt has had poor compliance with follow up and declined vascular access placement at his last admission in May but understands that he is nearing the need to start dialysis.   Will consult IR for [facility] and [facility] for vascular access placement.   Will also start educating pt on dialysis and will need outpatient dialysis arrangements made. Plan to start HD after TDC placed. Hypertensive urgency-  resume meds and follow response.  Acute on chronic diastolic chf - started IV lasix 80 mg q6 but not significant diuresis so will increase to 120 mg and follow response. Symptomatic anemia - hgb down to 5.5.  Need to guaiac stools given his history of GIB in the past.  Transfuse per primary.  Likely has component of CKD and will need ESA and possible IV iron. Metabolic acidosis - due to CKD.  Hold off on bicarb for now given edema and  follow with IV lasix Proteinuria - pt noted to have nephrotic range proteinuria in the past, however renal US with echogenic kidneys and given advanced CKD did not feel biopsy would change his trajectory.  Hyperkalemia - will start IV lasix and follow.  Will also give lokelma. CKD-BMD- low calcium, will check phos and iPTH and treat accordingly.  Disposition - we discussed the severity and chronicity of his CKD and need for better compliance with meds and follow up.  I also stressed the importance to be committed in dialysis moving forward.  He acknowledged understanding and admitted that "I have to do better".   Donetta Potts, MD [employer] [phone]

## 2021-02-09 ENCOUNTER — Inpatient Hospital Stay (HOSPITAL_COMMUNITY): Payer: 59

## 2021-02-09 DIAGNOSIS — N185 Chronic kidney disease, stage 5: Secondary | ICD-10-CM | POA: Diagnosis not present

## 2021-02-09 DIAGNOSIS — N179 Acute kidney failure, unspecified: Principal | ICD-10-CM

## 2021-02-09 DIAGNOSIS — N186 End stage renal disease: Secondary | ICD-10-CM

## 2021-02-09 DIAGNOSIS — I10 Essential (primary) hypertension: Secondary | ICD-10-CM | POA: Diagnosis not present

## 2021-02-09 HISTORY — PX: IR US GUIDE VASC ACCESS RIGHT: IMG2390

## 2021-02-09 HISTORY — PX: IR FLUORO GUIDE CV LINE RIGHT: IMG2283

## 2021-02-09 LAB — CBC
HCT: 23.5 % — ABNORMAL LOW (ref 39.0–52.0)
Hemoglobin: 7.4 g/dL — ABNORMAL LOW (ref 13.0–17.0)
MCH: 26.1 pg (ref 26.0–34.0)
MCHC: 31.5 g/dL (ref 30.0–36.0)
MCV: 82.7 fL (ref 80.0–100.0)
Platelets: 137 10*3/uL — ABNORMAL LOW (ref 150–400)
RBC: 2.84 MIL/uL — ABNORMAL LOW (ref 4.22–5.81)
RDW: 18.6 % — ABNORMAL HIGH (ref 11.5–15.5)
WBC: 5 10*3/uL (ref 4.0–10.5)
nRBC: 0 % (ref 0.0–0.2)

## 2021-02-09 LAB — BASIC METABOLIC PANEL
Anion gap: 9 (ref 5–15)
BUN: 72 mg/dL — ABNORMAL HIGH (ref 8–23)
CO2: 16 mmol/L — ABNORMAL LOW (ref 22–32)
Calcium: 7.5 mg/dL — ABNORMAL LOW (ref 8.9–10.3)
Chloride: 112 mmol/L — ABNORMAL HIGH (ref 98–111)
Creatinine, Ser: 6.55 mg/dL — ABNORMAL HIGH (ref 0.61–1.24)
GFR, Estimated: 9 mL/min — ABNORMAL LOW (ref >60)
Glucose, Bld: 97 mg/dL (ref 70–99)
Potassium: 4.3 mmol/L (ref 3.5–5.1)
Sodium: 137 mmol/L (ref 135–145)

## 2021-02-09 LAB — RENAL FUNCTION PANEL
Albumin: 2.6 g/dL — ABNORMAL LOW (ref 3.5–5.0)
Anion gap: 8 (ref 5–15)
BUN: 71 mg/dL — ABNORMAL HIGH (ref 8–23)
CO2: 16 mmol/L — ABNORMAL LOW (ref 22–32)
Calcium: 7.6 mg/dL — ABNORMAL LOW (ref 8.9–10.3)
Chloride: 113 mmol/L — ABNORMAL HIGH (ref 98–111)
Creatinine, Ser: 6.41 mg/dL — ABNORMAL HIGH (ref 0.61–1.24)
GFR, Estimated: 9 mL/min — ABNORMAL LOW (ref >60)
Glucose, Bld: 95 mg/dL (ref 70–99)
Phosphorus: 5.4 mg/dL — ABNORMAL HIGH (ref 2.5–4.6)
Potassium: 4.4 mmol/L (ref 3.5–5.1)
Sodium: 137 mmol/L (ref 135–145)

## 2021-02-09 LAB — TYPE AND SCREEN
ABO/RH(D): O POS
Antibody Screen: NEGATIVE
Unit division: 0
Unit division: 0
Unit division: 0

## 2021-02-09 LAB — BPAM RBC
Blood Product Expiration Date: 202209202359
Blood Product Expiration Date: 202209272359
Blood Product Expiration Date: 202209302359
ISSUE DATE / TIME: 202208301735
ISSUE DATE / TIME: 202208302110
ISSUE DATE / TIME: 202208310529
Unit Type and Rh: 5100
Unit Type and Rh: 5100
Unit Type and Rh: 5100

## 2021-02-09 LAB — PARATHYROID HORMONE, INTACT (NO CA): PTH: 212 pg/mL — ABNORMAL HIGH (ref 15–65)

## 2021-02-09 IMAGING — XA IR TUNNELED CV CATH W/O PORT/PUMP >5YO
2 series · 3 of 3 positions shown · IV contrast (IODINE)
Comparison: none

INDICATION: 65-year-old male with end-stage renal disease requiring central
venous access for initiation of hemodialysis.

[Series 1: ir (id) (id)/(id)/(id) ir · 1 of 1 slices shown]
[im 1/1]
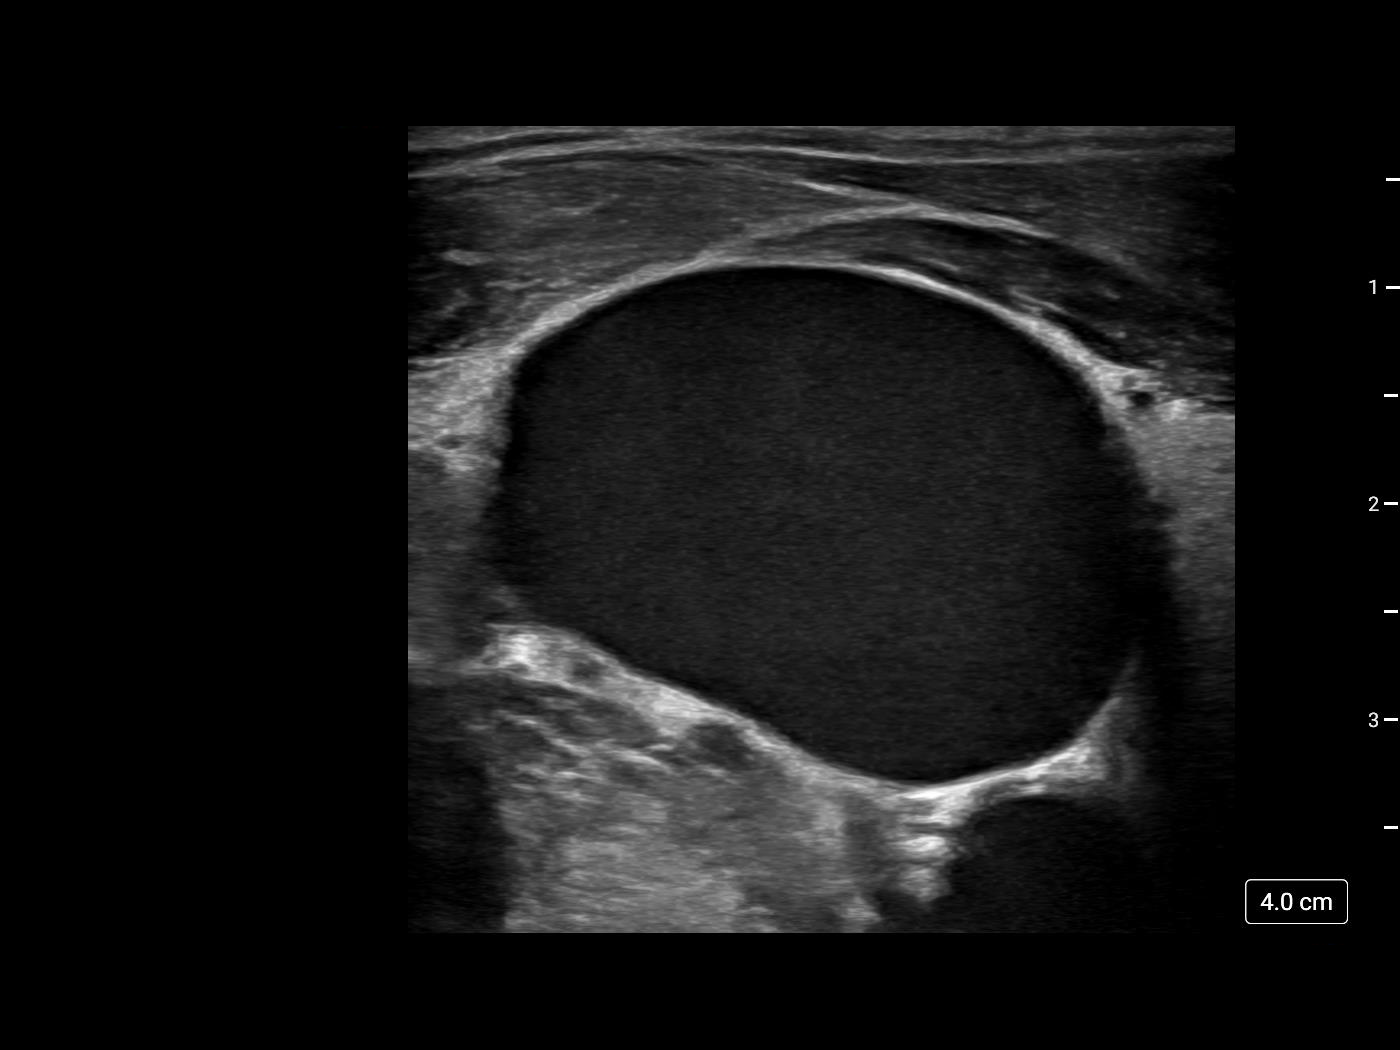

[Series 1: body 3 care · 2 of 2 frames shown]
[frame 1/2]
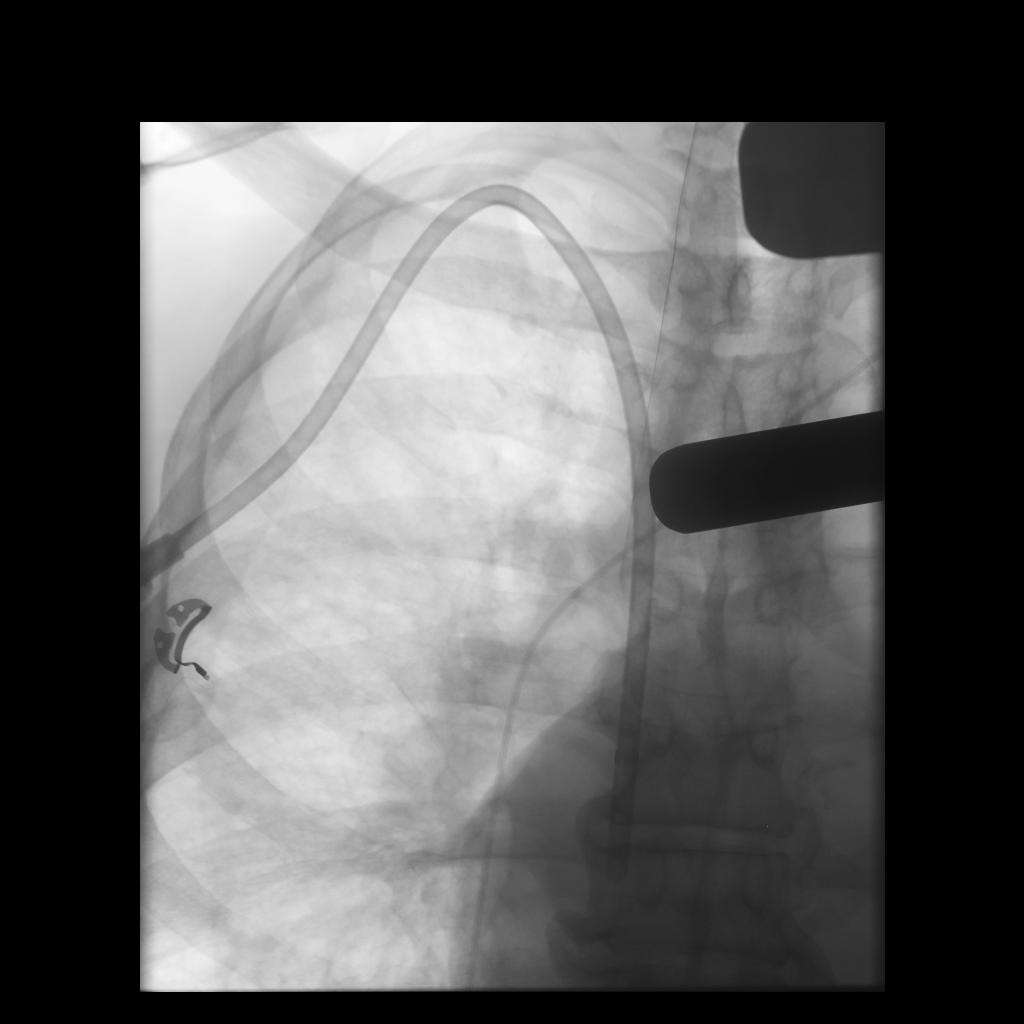
[frame 2/2]
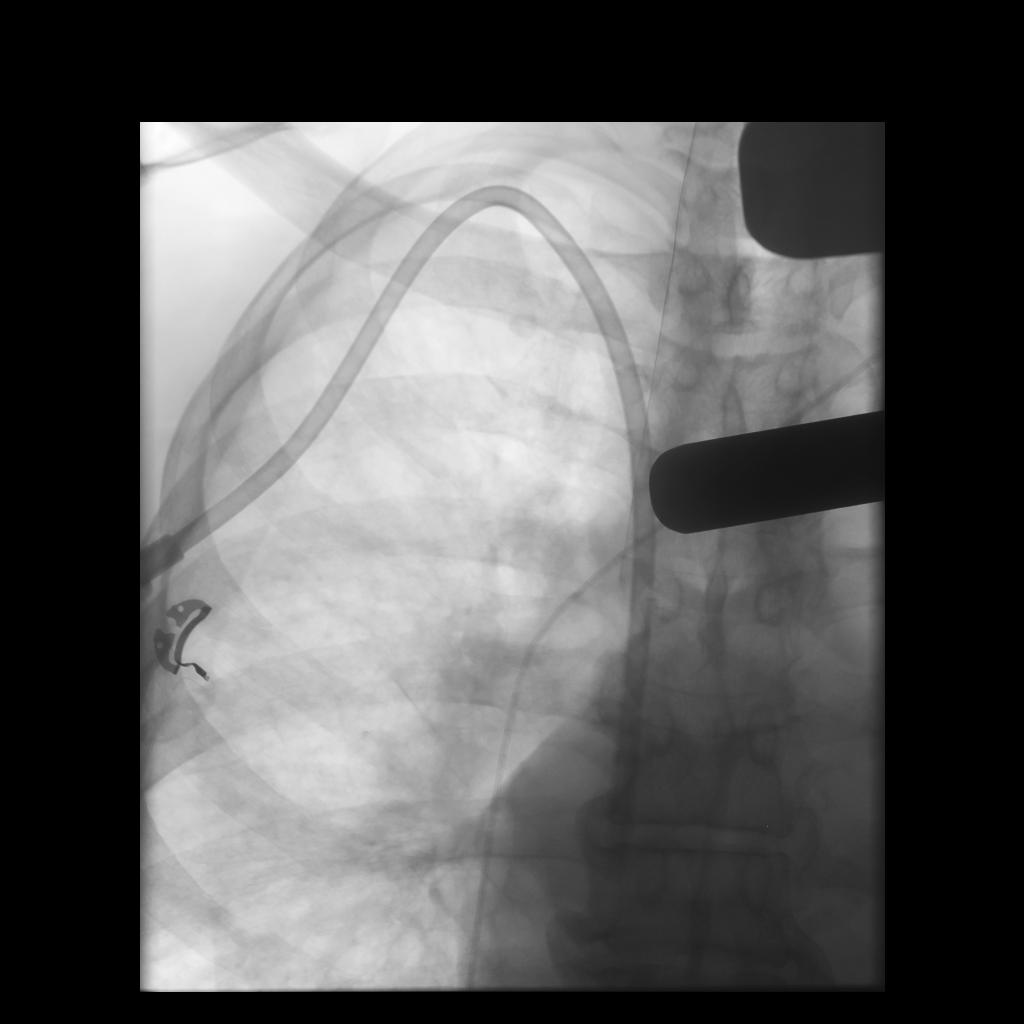

[3 of 3 positions shown; findings below may reference images not displayed]

EXAM:
TUNNELED CENTRAL VENOUS HEMODIALYSIS CATHETER PLACEMENT WITH
ULTRASOUND AND FLUOROSCOPIC GUIDANCE

MEDICATIONS:
Ancef 2 gm IV . The antibiotic was given in an appropriate time
interval prior to skin puncture.

ANESTHESIA/SEDATION:
Moderate (conscious) sedation was employed during this procedure. A
total of Versed 2 mg and Fentanyl 100 mcg was administered
intravenously.

Moderate Sedation Time: 10 minutes. The patient's level of
consciousness and vital signs were monitored continuously by
radiology nursing throughout the procedure under my direct
supervision.

FLUOROSCOPY TIME:  0 minutes 12 seconds (2.2 mGy).

COMPLICATIONS:
None immediate.



After creating a small venotomy incision, a 21 gauge micropuncture
kit was utilized to access the internal jugular vein. Real-time
ultrasound guidance was utilized for vascular access including the
acquisition of a permanent ultrasound image documenting patency of
the accessed vessel.

A Rosen wire was advanced to the level of the IVC and the
micropuncture sheath was exchanged for an 8 Fr dilator. A
French tunneled hemodialysis catheter measuring 23 cm from tip to
cuff was tunneled in a retrograde fashion from the anterior chest
wall to the venotomy incision. Serial dilation was then performed an
a peel-away sheath was placed.

The catheter was then placed through the peel-away sheath with the
catheter tip ultimately positioned within the right atrium. Final
catheter positioning was confirmed and documented with a spot
radiographic image. The catheter aspirates and flushes normally. The
catheter was flushed with appropriate volume heparin dwells.

The catheter exit site was secured with a 0-Silk retention suture.
The venotomy incision was closed with Dermabond. Sterile dressings
were applied. The patient tolerated the procedure well without
immediate post procedural complication.
IMPRESSION: Successful placement of 23 cm tip to cuff tunneled hemodialysis
catheter via the right internal jugular vein with catheter tip
terminating within the right atrium. The catheter is ready for
immediate use.

## 2021-02-09 MED ORDER — CEFAZOLIN SODIUM-DEXTROSE 2-4 GM/100ML-% IV SOLN
INTRAVENOUS | Status: AC
  Administered 2021-02-09: 2 g via INTRAVENOUS
  Filled 2021-02-09: qty 100

## 2021-02-09 MED ORDER — MIDAZOLAM HCL 2 MG/2ML IJ SOLN
INTRAMUSCULAR | Status: AC | PRN
  Administered 2021-02-09 (×2): 1 mg via INTRAVENOUS

## 2021-02-09 MED ORDER — CEFAZOLIN SODIUM-DEXTROSE 2-4 GM/100ML-% IV SOLN
2.0000 g | INTRAVENOUS | Status: AC
  Filled 2021-02-09: qty 100

## 2021-02-09 MED ORDER — MIDAZOLAM HCL 2 MG/2ML IJ SOLN
INTRAMUSCULAR | Status: AC
  Filled 2021-02-09: qty 4

## 2021-02-09 MED ORDER — LIDOCAINE-EPINEPHRINE 1 %-1:100000 IJ SOLN
INTRAMUSCULAR | Status: AC
  Filled 2021-02-09: qty 1

## 2021-02-09 MED ORDER — FENTANYL CITRATE (PF) 100 MCG/2ML IJ SOLN
INTRAMUSCULAR | Status: AC | PRN
  Administered 2021-02-09 (×2): 50 ug via INTRAVENOUS

## 2021-02-09 MED ORDER — FENTANYL CITRATE (PF) 100 MCG/2ML IJ SOLN
INTRAMUSCULAR | Status: AC
  Filled 2021-02-09: qty 2

## 2021-02-09 MED ORDER — CHLORHEXIDINE GLUCONATE CLOTH 2 % EX PADS
6.0000 | MEDICATED_PAD | Freq: Every day | CUTANEOUS | Status: DC
  Administered 2021-02-10 – 2021-02-14 (×5): 6 via TOPICAL

## 2021-02-09 MED ORDER — HEPARIN SODIUM (PORCINE) 1000 UNIT/ML IJ SOLN
INTRAMUSCULAR | Status: AC
  Filled 2021-02-09: qty 1

## 2021-02-09 MED ORDER — LIDOCAINE-EPINEPHRINE 1 %-1:100000 IJ SOLN
INTRAMUSCULAR | Status: AC | PRN
  Administered 2021-02-09: 10 mL

## 2021-02-09 MED ORDER — LIDOCAINE HCL 1 % IJ SOLN
INTRAMUSCULAR | Status: AC
  Filled 2021-02-09: qty 20

## 2021-02-09 MED ORDER — KIDNEY FAILURE BOOK: Freq: Once | Status: AC

## 2021-02-09 NOTE — Plan of Care (Signed)
  Problem: Education: Goal: Knowledge of General Education information will improve Description: Including pain rating scale, medication(s)/side effects and non-pharmacologic comfort measures Outcome: Progressing   Problem: Health Behavior/Discharge Planning: Goal: Ability to manage health-related needs will improve Outcome: Progressing   Problem: Clinical Measurements: Goal: Will remain free from infection Outcome: Progressing Goal: Diagnostic test results will improve Outcome: Progressing Goal: Respiratory complications will improve Outcome: Progressing Goal: Cardiovascular complication will be avoided Outcome: Progressing   Problem: Nutrition: Goal: Adequate nutrition will be maintained Outcome: Progressing

## 2021-02-09 NOTE — Progress Notes (Addendum)
   Subjective: No overnight events. Patient reports a headache and feeling sleepy.   Objective:  Vital signs in last 24 hours: Vitals:   02/09/21 1000 02/09/21 1005 02/09/21 1133 02/09/21 1627  BP: (!) 186/90 (!) 190/86 (!) 167/84 (!) 169/74  Pulse: 73 68 65 65  Resp: '16 14 16 16  '$ Temp:   97.8 F (36.6 C) 97.6 F (36.4 C)  TempSrc:   Oral Oral  SpO2: 100% 100% 100% 100%  Weight:      Height:       Physical Exam General: alert, appears stated age, in no acute distress HEENT: Normocephalic, atraumatic, EOM intact, conjunctiva normal CV: Regular rate and rhythm, no murmurs rubs or gallops Pulm: Clear to auscultation bilaterally, normal work of breathing Abdomen: Soft, nondistended, bowel sounds present, no tenderness to palpation MSK: No lower extremity edema Skin: Warm and dry Neuro: Alert and oriented x3   Assessment/Plan:  Active Problems:   Symptomatic anemia   Hypertensive emergency   Acute kidney injury ([place])   Bilateral lower extremity edema   Acute on chronic combined systolic and diastolic congestive heart failure (HCC)   Acute on chronic renal failure [facility])  Mr. Galdino Twisdale is a 45 yoM with a PMHx of CKD stage 5 due to poorly controlled HTN, anemia of chronic disease who presented with dyspnea on exertion, bilateral lower extremity and scrotal edema admitted for severe symptomatic hypertension and progression of CKD 5 to ESRD requiring HD.   Hypertensive emergency Chronic uncontrolled hypertension  Patient presented with severely elevated blood pressure, progressive renal failure, and anasarca in the setting of non compliance with his blood pressure medications. Blood pressure is currently uncontrolled on amlodipine, carvedilol, furosemide and prn hydralazine. He missed his morning coreg dose due to being off the floor for his vascular access procedure. Plan for HD tomorrow which will ultimately better control volume status and blood pressure. Will continue to  titrate medications as needed. - Continue amlodipine 10 mg  - Increase carvedilol to 25 mg BID  - Continue Imdur 30 mg, can up titrate to 60 mg if needed - PRN IV hydralazine 10 mg q4h for SBP > 180  AKI on CKD stage V, now ESRD  Thought to be due to uncontrolled HTN. Right sided tunneled dialysis catheter placed this afternoon with plans to start HD late tonight vs early tomorrow. Plan for LUE AV fistula vs AV graft tomorrow.  - Nephrology and vascular on board, appreciate recommendations - CLIP process started   Acute on Chronic diastolic heart failure Improvement of LE edema and scrotal swelling on IV furosemide. Continue diuresis. - Continue IV furosemide 80 mg q6h  - Strict ins and outs - Daily weights   Acute on chronic anemia of chronic disease  Hgb stable s/p 2 units PRBC. Iron studies consistent with IDA. Although likely in the setting of chronic kidney disease.  - Trend CBC - Transfuse for hgb <7 - Will need ESA and IV iron  Hyperkalemia, resolved - Trend BMP  Venous stasis ulcers vs ecchymosis - ABI's unremarkable. Discoloration of LE likely due to venous stasis.   Prior to Admission Living Arrangement: Anticipated Discharge Location: Barriers to Discharge: Dispo: Anticipated discharge pending clinical improvement  Syrita Dovel, Charlsie Quest, DO 02/09/2021, 5:13 PM Pager: [phone] After 5pm on weekdays and 1pm on weekends: On Call pager [phone]

## 2021-02-09 NOTE — Progress Notes (Signed)
Oozing blood seen around the R IJ cath, notified MD Suttle, gave orders to position pt 90 degrees, place pressure at neck and change the dressing.  placed an order for IV team to change the dressing.  Will continue to monitor.

## 2021-02-09 NOTE — Progress Notes (Signed)
Pt dressing changed due to saturated dressing. No active bleeding. Line placed today. Dressing changed per Dr. Serafina Royals to Ulice Dash, RN in progress note.

## 2021-02-09 NOTE — Progress Notes (Signed)
VASCULAR LAB    Bilateral upper extremity vein mapping has been performed.  See CV proc for preliminary results.   Romen Yutzy, RVT 02/09/2021, 4:03 PM

## 2021-02-09 NOTE — Procedures (Signed)
Interventional Radiology Procedure Note  Procedure: Tunneled hemodialysis catheter placement  Findings: Please refer to procedural dictation for full description. 23 cm, 14.5 Fr, dual lumen Palindrome placed in right IJ with tip in right atrium.  Complications: None immediate  Estimated Blood Loss: < 5 mL  Recommendations: Catheter ready for immediate use.   Ruthann Cancer, MD Pager: [phone]

## 2021-02-09 NOTE — H&P (View-Only) (Signed)
Hospital Consult    Reason for Consult:  permanent dialysis access Requesting Physician:  Dr. Marval Regal MRN #:  [id number]  History of Present Illness: This is a 65 y.o. male admitted to the hospital with renal failure likely due to poorly controlled hypertension.  He is being seen in consultation for evaluation of permanent dialysis access placement.  Interventional radiology placed a right-sided tunneled dialysis catheter this afternoon.  Patient is right-hand dominant and would prefer access placement in his left arm.  He has not had any prior surgery on his left arm or left chest including pacemaker or port.  He is not taking any blood thinners.  During hospital stay patient has also had a ABI performed which demonstrates PAD especially of right lower extremity with ABI of 0.6.  He denies claudication, rest pain, or nonhealing wounds of his feet.  He states he does have "eczema flareups" which is what he believes to the pretibial wounds are related to.  He is an every day smoker.  Past Medical History:  Diagnosis Date   Acute on chronic kidney failure ([place])    Anemia 04/04/2020   Blood transfusion without reported diagnosis    Diabetes mellitus without complication (HCC)    GI bleed    H. pylori infection    Hemorrhoids    Hyperplastic colon polyp    Hypertension     Past Surgical History:  Procedure Laterality Date   BIOPSY  08/31/2020   Procedure: BIOPSY;  Surgeon: Irving Copas., MD;  Location: [facility];  Service: Gastroenterology;;   CARDIAC CATHETERIZATION     COLONOSCOPY WITH PROPOFOL N/A 08/31/2020   Procedure: COLONOSCOPY WITH PROPOFOL;  Surgeon: Irving Copas., MD;  Location: [place];  Service: Gastroenterology;  Laterality: N/A;   ESOPHAGOGASTRODUODENOSCOPY (EGD) WITH PROPOFOL N/A 08/31/2020   Procedure: ESOPHAGOGASTRODUODENOSCOPY (EGD) WITH PROPOFOL;  Surgeon: Rush Landmark Telford Nab., MD;  Location: [facility];  Service: Gastroenterology;   Laterality: N/A;   IR FLUORO GUIDE CV LINE RIGHT  02/09/2021   IR US GUIDE VASC ACCESS RIGHT  02/09/2021   POLYPECTOMY  08/31/2020   Procedure: POLYPECTOMY;  Surgeon: Mansouraty, Telford Nab., MD;  Location: [place];  Service: Gastroenterology;;    No Known Allergies  Prior to Admission medications   Medication Sig Start Date End Date Taking? Authorizing Provider  amLODipine (NORVASC) 10 MG tablet Take 1 tablet (10 mg total) by mouth daily. 10/30/20  Yes Sanjuan Dame, MD  carvedilol (COREG) 12.5 MG tablet Take 1 tablet (12.5 mg total) by mouth 2 (two) times daily with a meal. 10/30/20 02/07/21 Yes Sanjuan Dame, MD  ferrous sulfate 325 (65 FE) MG tablet Take 1 tablet (325 mg total) by mouth daily with breakfast. 10/30/20  Yes Sanjuan Dame, MD  furosemide (LASIX) 40 MG tablet Take 1 tablet (40 mg total) by mouth 2 (two) times daily. 10/30/20 02/07/21 Yes Sanjuan Dame, MD  isosorbide mononitrate (IMDUR) 30 MG 24 hr tablet Take 1 tablet (30 mg total) by mouth daily. 10/30/20  Yes Sanjuan Dame, MD  pantoprazole (PROTONIX) 40 MG tablet Take 1 tablet (40 mg total) by mouth daily. 10/31/20  Yes Sanjuan Dame, MD  Amoxicill-Rifabutin-Omeprazole (TALICIA) 99991111 MG CPDR Take 4 tablets by mouth every 8 (eight) hours. Patient not taking: Reported on 02/07/2021 09/09/20   Pyrtle, Lajuan Lines, MD    Social History   Socioeconomic History   Marital status: Single    Spouse name: Not on file   Number of children: Not on file   Years  of education: Not on file   Highest education level: Not on file  Occupational History   Not on file  Tobacco Use   Smoking status: Every Day    Packs/day: 0.25    Types: Cigarettes   Smokeless tobacco: Never   Tobacco comments:    2021  " i AM CUTTING BACK :"  Vaping Use   Vaping Use: Never used  Substance and Sexual Activity   Alcohol use: Not Currently   Drug use: Never   Sexual activity: Not on file  Other Topics Concern   Not on file   Social History Narrative   Not on file   Social Determinants of Health   Financial Resource Strain: Not on file  Food Insecurity: Not on file  Transportation Needs: Not on file  Physical Activity: Not on file  Stress: Not on file  Social Connections: Not on file  Intimate Partner Violence: Not on file     No family history on file.  ROS: Otherwise negative unless mentioned in HPI  Physical Examination  Vitals:   02/09/21 1005 02/09/21 1133  BP: (!) 190/86 (!) 167/84  Pulse: 68 65  Resp: 14 16  Temp:  97.8 F (36.6 C)  SpO2: 100% 100%   Body mass index is 27.42 kg/m.  General:  WDWN in NAD Gait: Not observed HENT: WNL, normocephalic Pulmonary: normal non-labored breathing, without Rales, rhonchi,  wheezing Cardiac: regular Abdomen:  soft, NT/ND, no masses Skin: without rashes Vascular Exam/Pulses: Symmetrical radial pulses Extremities: Visible left basilic vein near the AC fossa; feet are warm and well-perfused without nonhealing wounds; superficial wounds of right shin without sign of infection or drainage Musculoskeletal: no muscle wasting or atrophy  Neurologic: A&O X 3;  No focal weakness or paresthesias are detected; speech is fluent/normal Psychiatric:  The pt has Normal affect. Lymph:  Unremarkable  CBC    Component Value Date/Time   WBC 5.0 02/09/2021 0423   RBC 2.84 (L) 02/09/2021 0423   HGB 7.4 (L) 02/09/2021 0423   HCT 23.5 (L) 02/09/2021 0423   PLT 137 (L) 02/09/2021 0423   MCV 82.7 02/09/2021 0423   MCH 26.1 02/09/2021 0423   MCHC 31.5 02/09/2021 0423   RDW 18.6 (H) 02/09/2021 0423   LYMPHSABS 0.6 (L) 10/29/2020 0248   MONOABS 0.6 10/29/2020 0248   EOSABS 0.2 10/29/2020 0248   BASOSABS 0.0 10/29/2020 0248    BMET    Component Value Date/Time   NA 137 02/09/2021 0423   NA 137 02/09/2021 0423   K 4.4 02/09/2021 0423   K 4.3 02/09/2021 0423   CL 113 (H) 02/09/2021 0423   CL 112 (H) 02/09/2021 0423   CO2 16 (L) 02/09/2021 0423   CO2  16 (L) 02/09/2021 0423   GLUCOSE 95 02/09/2021 0423   GLUCOSE 97 02/09/2021 0423   BUN 71 (H) 02/09/2021 0423   BUN 72 (H) 02/09/2021 0423   CREATININE 6.41 (H) 02/09/2021 0423   CREATININE 6.55 (H) 02/09/2021 0423   CALCIUM 7.6 (L) 02/09/2021 0423   CALCIUM 7.5 (L) 02/09/2021 0423   CALCIUM 7.3 (L) 10/29/2020 1528   GFRNONAA 9 (L) 02/09/2021 0423   GFRNONAA 9 (L) 02/09/2021 0423    COAGS: Lab Results  Component Value Date   INR 1.3 (H) 02/08/2021   INR 1.1 08/29/2020   INR 1.2 04/04/2020     Non-Invasive Vascular Imaging:    Vein mapping pending   ABI/TBIToday's ABIToday's TBIPrevious ABIPrevious TBI  +-------+-----------+-----------+------------+------------+  Right  0.64       0.16                                 +-------+-----------+-----------+------------+------------+  Left   0.81       0.27                                 +-------+-----------+-----------+------------+------------+   ASSESSMENT/PLAN: This is a 65 y.o. male seen in consultation for evaluation for permanent dialysis access.  Patient also had ABIs performed during hospitalization consistent with bilateral lower extremity PAD  -Patient is right arm dominant and would prefer access placement in his left arm; move left arm IV and restrict extremity -Vein mapping pending -Plan is for left arm AV fistula creation versus graft placement in the OR tomorrow with Dr. Virl Cagey -Risks of the procedure were discussed with the patient and he agrees to proceed -N.p.o, consent ordered  -Patient also has ABI and TBI consistent with PAD -Patient states he has episodic flareups of eczema which is what he is experiencing on his right leg currently -He does not have rest pain or nonhealing wounds of bilateral lower extremities -No indication for intervention for PAD currently.  This can be followed as an outpatient   Dagoberto Ligas PA-C [facility] [phone]  VASCULAR STAFF  ADDENDUM: I have independently interviewed and examined the patient. I agree with the above.  Plan LUE AVF vs. AVG tomorrow in OR with Dr. Virl Cagey Vein mapping pending.  Yevonne Aline. Stanford Breed, MD [facility] Phone Number: [phone] 02/09/2021 3:09 PM

## 2021-02-09 NOTE — Progress Notes (Signed)
Patient ID: Jeffrey Zuniga, male   DOB: 1956-02-14, 65 y.o.   MRN: [id number] S: Feels better, just got back from [facility] placement in IR. O:BP (!) 167/84 (BP Location: Left Arm)   Pulse 65   Temp 97.8 F (36.6 C) (Oral)   Resp 16   Ht $R'5\' 7"'dp$  (1.702 m)   Wt 79.4 kg   SpO2 100%   BMI 27.42 kg/m   Intake/Output Summary (Last 24 hours) at 02/09/2021 1401 Last data filed at 02/09/2021 1400 Gross per 24 hour  Intake 275 ml  Output 1400 ml  Net -1125 ml   Intake/Output: I/O last 3 completed shifts: In: 1153.3 [Blood:1153.3] Out: 2160 [Urine:2160]  Intake/Output this shift:  Total I/O In: 275 [P.O.:275] Out: 600 [Urine:600] Weight change: -0.433 kg Gen:NAD CVS: RRR Resp: CTA Abd: +BS, soft, NT/ND Ext: trace edema BLE  Recent Labs  Lab 02/07/21 1250 02/08/21 0349 02/09/21 0423  NA 137 138 137  137  K 5.3* 4.6 4.4  4.3  CL 113* 114* 113*  112*  CO2 16* 17* 16*  16*  GLUCOSE 115* 93 95  97  BUN 68* 66* 71*  72*  CREATININE 6.13* 5.98* 6.41*  6.55*  ALBUMIN  --  2.6* 2.6*  CALCIUM 7.8* 7.8* 7.6*  7.5*  PHOS  --  5.2* 5.4*   Liver Function Tests: Recent Labs  Lab 02/08/21 0349 02/09/21 0423  ALBUMIN 2.6* 2.6*   No results for input(s): LIPASE, AMYLASE in the last 168 hours. No results for input(s): AMMONIA in the last 168 hours. CBC: Recent Labs  Lab 02/07/21 1250 02/08/21 0349 02/08/21 1111 02/09/21 0423  WBC 5.0 4.7  --  5.0  HGB 5.5* 6.8* 7.5* 7.4*  HCT 18.9* 21.5* 23.5* 23.5*  MCV 89.2 81.1  --  82.7  PLT 156 145*  --  137*   Cardiac Enzymes: No results for input(s): CKTOTAL, CKMB, CKMBINDEX, TROPONINI in the last 168 hours. CBG: No results for input(s): GLUCAP in the last 168 hours.  Iron Studies:  Recent Labs    02/08/21 1111  IRON 38*  TIBC 298  FERRITIN 23*   Studies/Results: IR US Guide Vasc Access Right  Result Date: 02/09/2021 INDICATION: 65 year old male with end-stage renal disease requiring central venous access for initiation of  hemodialysis. EXAM: TUNNELED CENTRAL VENOUS HEMODIALYSIS CATHETER PLACEMENT WITH ULTRASOUND AND FLUOROSCOPIC GUIDANCE MEDICATIONS: Ancef 2 gm IV . The antibiotic was given in an appropriate time interval prior to skin puncture. ANESTHESIA/SEDATION: Moderate (conscious) sedation was employed during this procedure. A total of Versed 2 mg and Fentanyl 100 mcg was administered intravenously. Moderate Sedation Time: 10 minutes. The patient's level of consciousness and vital signs were monitored continuously by radiology nursing throughout the procedure under my direct supervision. FLUOROSCOPY TIME:  0 minutes 12 seconds (2.2 mGy). COMPLICATIONS: None immediate. PROCEDURE: Informed written consent was obtained from the patient after a discussion of the risks, benefits, and alternatives to treatment. Questions regarding the procedure were encouraged and answered. The right neck and chest were prepped with chlorhexidine in a sterile fashion, and a sterile drape was applied covering the operative field. Maximum barrier sterile technique with sterile gowns and gloves were used for the procedure. A timeout was performed prior to the initiation of the procedure. After creating a small venotomy incision, a 21 gauge micropuncture kit was utilized to access the internal jugular vein. Real-time ultrasound guidance was utilized for vascular access including the acquisition of a permanent ultrasound image documenting patency of the accessed  vessel. A Rosen wire was advanced to the level of the IVC and the micropuncture sheath was exchanged for an 8 Fr dilator. A 14.5 French tunneled hemodialysis catheter measuring 23 cm from tip to cuff was tunneled in a retrograde fashion from the anterior chest wall to the venotomy incision. Serial dilation was then performed an a peel-away sheath was placed. The catheter was then placed through the peel-away sheath with the catheter tip ultimately positioned within the right atrium. Final catheter  positioning was confirmed and documented with a spot radiographic image. The catheter aspirates and flushes normally. The catheter was flushed with appropriate volume heparin dwells. The catheter exit site was secured with a 0-Silk retention suture. The venotomy incision was closed with Dermabond. Sterile dressings were applied. The patient tolerated the procedure well without immediate post procedural complication. IMPRESSION: Successful placement of 23 cm tip to cuff tunneled hemodialysis catheter via the right internal jugular vein with catheter tip terminating within the right atrium. The catheter is ready for immediate use. Jeffrey Cancer, MD [facility] Electronically Signed   By: Jeffrey Zuniga M.D.   On: 02/09/2021 10:30   VAS [place] ABI WITH/WO TBI  Result Date: 02/08/2021  LOWER EXTREMITY DOPPLER STUDY Patient Name:  Jeffrey Zuniga  Date of Exam:   02/08/2021 Medical Rec #: [id number]     Accession #:    [id number] Date of Birth: 1956-04-16     Patient Gender: M Patient Age:   67 years Exam Location:  [facility] Procedure:      VAS [place] ABI WITH/WO TBI Referring Phys: EMILY MULLEN --------------------------------------------------------------------------------  Indications: Peripheral artery disease. High Risk Factors: Hypertension, Diabetes.  Comparison Study: No prior studies. Performing Technologist: Carlos Levering RVT  Examination Guidelines: A complete evaluation includes at minimum, Doppler waveform signals and systolic blood pressure reading at the level of bilateral brachial, anterior tibial, and posterior tibial arteries, when vessel segments are accessible. Bilateral testing is considered an integral part of a complete examination. Photoelectric Plethysmograph (PPG) waveforms and toe systolic pressure readings are included as required and additional duplex testing as needed. Limited examinations for reoccurring indications may be performed  as noted.  ABI Findings: +---------+------------------+-----+----------+--------+ Right    Rt Pressure (mmHg)IndexWaveform  Comment  +---------+------------------+-----+----------+--------+ Brachial 165                    triphasic          +---------+------------------+-----+----------+--------+ PTA      105               0.64 monophasic         +---------+------------------+-----+----------+--------+ DP       106               0.64 monophasic         +---------+------------------+-----+----------+--------+ Great Toe26                0.16                    +---------+------------------+-----+----------+--------+ +---------+------------------+-----+---------+-------+ Left     Lt Pressure (mmHg)IndexWaveform Comment +---------+------------------+-----+---------+-------+ Brachial 164                    triphasic        +---------+------------------+-----+---------+-------+ PTA      105               0.64 biphasic         +---------+------------------+-----+---------+-------+ DP  134               0.81 biphasic         +---------+------------------+-----+---------+-------+ Great Toe44                0.27                  +---------+------------------+-----+---------+-------+ +-------+-----------+-----------+------------+------------+ ABI/TBIToday's ABIToday's TBIPrevious ABIPrevious TBI +-------+-----------+-----------+------------+------------+ Right  0.64       0.16                                +-------+-----------+-----------+------------+------------+ Left   0.81       0.27                                +-------+-----------+-----------+------------+------------+  Summary: Right: Resting right ankle-brachial index indicates moderate right lower extremity arterial disease. The right toe-brachial index is abnormal. Left: Resting left ankle-brachial index indicates mild left lower extremity arterial disease. The left toe-brachial  index is abnormal.  *See table(s) above for measurements and observations.  Electronically signed by Harold Barban MD on 02/08/2021 at 6:27:45 PM.    Final    ECHOCARDIOGRAM COMPLETE  Result Date: 02/08/2021    ECHOCARDIOGRAM REPORT   Patient Name:   DAREION KNEECE Date of Exam: 02/08/2021 Medical Rec #:  [id number]    Height:       67.0 in Accession #:    [id number]   Weight:       183.9 lb Date of Birth:  [birth year]    BSA:          1.951 m Patient Age:    89 years     BP:           145/73 mmHg Patient Gender: M            HR:           68 bpm. Exam Location:  Inpatient Procedure: 2D Echo, Color Doppler and Cardiac Doppler Indications:    Congestive Heart Failure I50.9  History:        Patient has prior history of Echocardiogram examinations, most                 recent 10/29/2020. Risk Factors:Diabetes and Hypertension.  Sonographer:    Bernadene Person RDCS Referring Phys: [place]  1. Left ventricular ejection fraction, by estimation, is 55 to 60%. The left ventricle has normal function. The left ventricle has no regional wall motion abnormalities. There is severe concentric left ventricular hypertrophy. Left ventricular diastolic  parameters are consistent with Grade II diastolic dysfunction (pseudonormalization). Elevated left ventricular end-diastolic pressure.  2. Right ventricular systolic function is normal. The right ventricular size is normal. There is severely elevated pulmonary artery systolic pressure. The estimated right ventricular systolic pressure is 36.6 mmHg.  3. Left atrial size was moderately dilated.  4. Right atrial size was moderately dilated.  5. A small pericardial effusion is present. The pericardial effusion is circumferential. There is no evidence of cardiac tamponade.  6. The mitral valve is grossly normal. Mild mitral valve regurgitation. No evidence of mitral stenosis.  7. Tricuspid valve regurgitation is mild to moderate.  8. The aortic valve is grossly normal.  Aortic valve regurgitation is trivial. Mild aortic valve sclerosis is present, with no evidence of aortic valve stenosis.  9. The inferior vena cava  is dilated in size with <50% respiratory variability, suggesting right atrial pressure of 15 mmHg. Comparison(s): Prior images reviewed side by side. Conclusion(s)/Recommendation(s): Severe concentric LVH, myocardium appearance concerning for infiltrative cardiomyopathy. Consider workup including PYP scan. FINDINGS  Left Ventricle: Left ventricular ejection fraction, by estimation, is 55 to 60%. The left ventricle has normal function. The left ventricle has no regional wall motion abnormalities. The left ventricular internal cavity size was normal in size. There is  severe concentric left ventricular hypertrophy. Left ventricular diastolic parameters are consistent with Grade II diastolic dysfunction (pseudonormalization). Elevated left ventricular end-diastolic pressure. Right Ventricle: The right ventricular size is normal. No increase in right ventricular wall thickness. Right ventricular systolic function is normal. There is severely elevated pulmonary artery systolic pressure. The tricuspid regurgitant velocity is 3.37 m/s, and with an assumed right atrial pressure of 15 mmHg, the estimated right ventricular systolic pressure is 85.8 mmHg. Left Atrium: Left atrial size was moderately dilated. Right Atrium: Right atrial size was moderately dilated. Pericardium: A small pericardial effusion is present. The pericardial effusion is circumferential. There is no evidence of cardiac tamponade. Mitral Valve: The mitral valve is grossly normal. There is mild thickening of the mitral valve leaflet(s). Mild mitral valve regurgitation. No evidence of mitral valve stenosis. Tricuspid Valve: The tricuspid valve is normal in structure. Tricuspid valve regurgitation is mild to moderate. No evidence of tricuspid stenosis. Aortic Valve: The aortic valve is grossly normal. Aortic  valve regurgitation is trivial. Mild aortic valve sclerosis is present, with no evidence of aortic valve stenosis. Pulmonic Valve: The pulmonic valve was grossly normal. Pulmonic valve regurgitation is mild. No evidence of pulmonic stenosis. Aorta: The aortic root, ascending aorta, aortic arch and descending aorta are all structurally normal, with no evidence of dilitation or obstruction. Venous: The inferior vena cava is dilated in size with less than 50% respiratory variability, suggesting right atrial pressure of 15 mmHg. IAS/Shunts: The atrial septum is grossly normal.  LEFT VENTRICLE PLAX 2D LVIDd:         5.30 cm      Diastology LVIDs:         3.60 cm      LV e' medial:    4.11 cm/s LV PW:         1.40 cm      LV E/e' medial:  29.4 LV IVS:        1.10 cm      LV e' lateral:   5.35 cm/s LVOT diam:     2.00 cm      LV E/e' lateral: 22.6 LV SV:         79 LV SV Index:   40 LVOT Area:     3.14 cm  LV Volumes (MOD) LV vol d, MOD A2C: 168.0 ml LV vol d, MOD A4C: 174.0 ml LV vol s, MOD A2C: 90.5 ml LV vol s, MOD A4C: 82.9 ml LV SV MOD A2C:     77.5 ml LV SV MOD A4C:     174.0 ml LV SV MOD BP:      84.0 ml RIGHT VENTRICLE RV S prime:     14.10 cm/s TAPSE (M-mode): 2.3 cm LEFT ATRIUM             Index       RIGHT ATRIUM           Index LA diam:        4.10 cm 2.10 cm/m  RA Area:  20.30 cm LA Vol (A2C):   61.5 ml 31.52 ml/m RA Volume:   58.80 ml  30.13 ml/m LA Vol (A4C):   57.6 ml 29.52 ml/m LA Biplane Vol: 64.0 ml 32.80 ml/m  AORTIC VALVE LVOT Vmax:   124.00 cm/s LVOT Vmean:  79.600 cm/s LVOT VTI:    0.251 m  AORTA Ao Root diam: 3.00 cm Ao Asc diam:  3.40 cm MITRAL VALVE                TRICUSPID VALVE MV Area (PHT): 3.89 cm     TR Peak grad:   45.4 mmHg MV Decel Time: 195 msec     TR Vmax:        337.00 cm/s MV E velocity: 121.00 cm/s MV A velocity: 77.70 cm/s   SHUNTS MV E/A ratio:  1.56         Systemic VTI:  0.25 m                             Systemic Diam: 2.00 cm Buford Dresser MD Electronically  signed by Buford Dresser MD Signature Date/Time: 02/08/2021/11:20:09 AM    Final    US SCROTUM W/DOPPLER  Result Date: 02/07/2021 CLINICAL DATA:  Testicular pain in a 65 year old male. EXAM: SCROTAL ULTRASOUND DOPPLER ULTRASOUND OF THE TESTICLES TECHNIQUE: Complete ultrasound examination of the testicles, epididymis, and other scrotal structures was performed. Color and spectral Doppler ultrasound were also utilized to evaluate blood flow to the testicles. COMPARISON:  Oct 28, 2020. FINDINGS: Right testicle Measurements: 3.9 x 2.8 x 2.4 cm. No visible mass, signs of abundant microlithiasis. Left testicle Measurements: 3.3 x 2.5 x 2.5 cm. No visible mass, signs of abundant microlithiasis. Right epididymis:  Normal in size and appearance. Left epididymis:  Normal in size and appearance. Hydrocele:  Bilateral hydroceles similar to the prior study. Varicocele:  None visualized. Pulsed Doppler interrogation of both testes demonstrates normal low resistance arterial and venous waveforms bilaterally. IMPRESSION: No signs of testicular torsion or testicular mass. No findings to indicate epididymitis or orchitis. Bilateral hydroceles slightly greater on the RIGHT. Signs of scrotal thickening diffusely is largely similar to the prior study. Correlate with any signs of edema or cellulitis. 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 Current literature suggests that testicular microlithiasis is not a significant independent risk factor for development of testicular carcinoma, and that follow up imaging is not warranted in the absence of other risk factors. Monthly testicular self-examination and annual physical exams are considered appropriate surveillance. If patient has other risk factors for testicular carcinoma, then referral to Urology should be considered. (Reference: DeCastro, et al.: A 5-Year Follow up Study of Asymptomatic Men with Testicular Microlithiasis. J Urol 2008; [id number].)  Electronically Signed   By: Zetta Bills M.D.   On: 02/07/2021 18:44    sodium chloride   Intravenous Once   amLODipine  10 mg Oral Daily   carvedilol  12.5 mg Oral BID WC   heparin sodium (porcine)       isosorbide mononitrate  30 mg Oral Daily   kidney failure book   Does not apply Once   lidocaine-EPINEPHrine       pantoprazole  40 mg Oral Daily   sodium zirconium cyclosilicate  10 g Oral Daily    BMET    Component Value Date/Time   NA 137 02/09/2021 0423   NA 137 02/09/2021 0423   K 4.4 02/09/2021 0423   K  4.3 02/09/2021 0423   CL 113 (H) 02/09/2021 0423   CL 112 (H) 02/09/2021 0423   CO2 16 (L) 02/09/2021 0423   CO2 16 (L) 02/09/2021 0423   GLUCOSE 95 02/09/2021 0423   GLUCOSE 97 02/09/2021 0423   BUN 71 (H) 02/09/2021 0423   BUN 72 (H) 02/09/2021 0423   CREATININE 6.41 (H) 02/09/2021 0423   CREATININE 6.55 (H) 02/09/2021 0423   CALCIUM 7.6 (L) 02/09/2021 0423   CALCIUM 7.5 (L) 02/09/2021 0423   CALCIUM 7.3 (L) 10/29/2020 1528   GFRNONAA 9 (L) 02/09/2021 0423   GFRNONAA 9 (L) 02/09/2021 0423   CBC    Component Value Date/Time   WBC 5.0 02/09/2021 0423   RBC 2.84 (L) 02/09/2021 0423   HGB 7.4 (L) 02/09/2021 0423   HCT 23.5 (L) 02/09/2021 0423   PLT 137 (L) 02/09/2021 0423   MCV 82.7 02/09/2021 0423   MCH 26.1 02/09/2021 0423   MCHC 31.5 02/09/2021 0423   RDW 18.6 (H) 02/09/2021 0423   LYMPHSABS 0.6 (L) 10/29/2020 0248   MONOABS 0.6 10/29/2020 0248   EOSABS 0.2 10/29/2020 0248   BASOSABS 0.0 10/29/2020 0248    Assessment/Plan:  AKI/CKD stage V - presumably due to poorly controlled HTN.  Pt has had poor compliance with follow up and declined vascular access placement at his last admission in May but understands that he is nearing the need to start dialysis.   S/p RIJ TDC by IR on 02/09/21 Consulted [facility] for vascular access placement.   Will also start educating pt on dialysis and will need outpatient dialysis arrangements made. Plan to start HD  tomorrow. Will need outpatient arrangements for dialysis to be made. Hypertensive urgency-  resume meds and follow response.  Acute on chronic diastolic chf - started IV lasix 80 mg q6 but not significant diuresis so will increase to 120 mg and follow response. Symptomatic anemia - hgb down to 5.5.  Need to guaiac stools given his history of GIB in the past.  Transfuse per primary.  Likely has component of CKD and will need ESA and possible IV iron. Metabolic acidosis - due to CKD.  Hold off on bicarb for now given edema and follow with IV lasix Proteinuria - pt noted to have nephrotic range proteinuria in the past, however renal US with echogenic kidneys and given advanced CKD did not feel biopsy would change his trajectory.  Hyperkalemia - will start IV lasix and follow.  Will also give lokelma. CKD-BMD- low calcium, elevated phos, and awaiting iPTH.  Will hold off on binders for now and await iPTH results and treat accordingly.  Disposition - we discussed the severity and chronicity of his CKD and need for better compliance with meds and follow up.  I also stressed the importance to be committed in dialysis moving forward.  He acknowledged understanding and admitted that "I have to do better".   Donetta Potts, MD [employer] [phone]

## 2021-02-09 NOTE — Consult Note (Addendum)
Hospital Consult    Reason for Consult:  permanent dialysis access Requesting Physician:  Dr. Marval Regal MRN #:  [id number]  History of Present Illness: This is a 65 y.o. male admitted to the hospital with renal failure likely due to poorly controlled hypertension.  He is being seen in consultation for evaluation of permanent dialysis access placement.  Interventional radiology placed a right-sided tunneled dialysis catheter this afternoon.  Patient is right-hand dominant and would prefer access placement in his left arm.  He has not had any prior surgery on his left arm or left chest including pacemaker or port.  He is not taking any blood thinners.  During hospital stay patient has also had a ABI performed which demonstrates PAD especially of right lower extremity with ABI of 0.6.  He denies claudication, rest pain, or nonhealing wounds of his feet.  He states he does have "eczema flareups" which is what he believes to the pretibial wounds are related to.  He is an every day smoker.  Past Medical History:  Diagnosis Date   Acute on chronic kidney failure ([place])    Anemia 04/04/2020   Blood transfusion without reported diagnosis    Diabetes mellitus without complication (HCC)    GI bleed    H. pylori infection    Hemorrhoids    Hyperplastic colon polyp    Hypertension     Past Surgical History:  Procedure Laterality Date   BIOPSY  08/31/2020   Procedure: BIOPSY;  Surgeon: Irving Copas., MD;  Location: [facility];  Service: Gastroenterology;;   CARDIAC CATHETERIZATION     COLONOSCOPY WITH PROPOFOL N/A 08/31/2020   Procedure: COLONOSCOPY WITH PROPOFOL;  Surgeon: Irving Copas., MD;  Location: [facility];  Service: Gastroenterology;  Laterality: N/A;   ESOPHAGOGASTRODUODENOSCOPY (EGD) WITH PROPOFOL N/A 08/31/2020   Procedure: ESOPHAGOGASTRODUODENOSCOPY (EGD) WITH PROPOFOL;  Surgeon: Rush Landmark Telford Nab., MD;  Location: [facility];  Service: Gastroenterology;   Laterality: N/A;   IR FLUORO GUIDE CV LINE RIGHT  02/09/2021   IR US GUIDE VASC ACCESS RIGHT  02/09/2021   POLYPECTOMY  08/31/2020   Procedure: POLYPECTOMY;  Surgeon: Mansouraty, Telford Nab., MD;  Location: [facility];  Service: Gastroenterology;;    No Known Allergies  Prior to Admission medications   Medication Sig Start Date End Date Taking? Authorizing Provider  amLODipine (NORVASC) 10 MG tablet Take 1 tablet (10 mg total) by mouth daily. 10/30/20  Yes Sanjuan Dame, MD  carvedilol (COREG) 12.5 MG tablet Take 1 tablet (12.5 mg total) by mouth 2 (two) times daily with a meal. 10/30/20 02/07/21 Yes Sanjuan Dame, MD  ferrous sulfate 325 (65 FE) MG tablet Take 1 tablet (325 mg total) by mouth daily with breakfast. 10/30/20  Yes Sanjuan Dame, MD  furosemide (LASIX) 40 MG tablet Take 1 tablet (40 mg total) by mouth 2 (two) times daily. 10/30/20 02/07/21 Yes Sanjuan Dame, MD  isosorbide mononitrate (IMDUR) 30 MG 24 hr tablet Take 1 tablet (30 mg total) by mouth daily. 10/30/20  Yes Sanjuan Dame, MD  pantoprazole (PROTONIX) 40 MG tablet Take 1 tablet (40 mg total) by mouth daily. 10/31/20  Yes Sanjuan Dame, MD  Amoxicill-Rifabutin-Omeprazole (TALICIA) 99991111 MG CPDR Take 4 tablets by mouth every 8 (eight) hours. Patient not taking: Reported on 02/07/2021 09/09/20   Pyrtle, Lajuan Lines, MD    Social History   Socioeconomic History   Marital status: Single    Spouse name: Not on file   Number of children: Not on file   Years  of education: Not on file   Highest education level: Not on file  Occupational History   Not on file  Tobacco Use   Smoking status: Every Day    Packs/day: 0.25    Types: Cigarettes   Smokeless tobacco: Never   Tobacco comments:    2021  " i AM CUTTING BACK :"  Vaping Use   Vaping Use: Never used  Substance and Sexual Activity   Alcohol use: Not Currently   Drug use: Never   Sexual activity: Not on file  Other Topics Concern   Not on file   Social History Narrative   Not on file   Social Determinants of Health   Financial Resource Strain: Not on file  Food Insecurity: Not on file  Transportation Needs: Not on file  Physical Activity: Not on file  Stress: Not on file  Social Connections: Not on file  Intimate Partner Violence: Not on file     No family history on file.  ROS: Otherwise negative unless mentioned in HPI  Physical Examination  Vitals:   02/09/21 1005 02/09/21 1133  BP: (!) 190/86 (!) 167/84  Pulse: 68 65  Resp: 14 16  Temp:  97.8 F (36.6 C)  SpO2: 100% 100%   Body mass index is 27.42 kg/m.  General:  WDWN in NAD Gait: Not observed HENT: WNL, normocephalic Pulmonary: normal non-labored breathing, without Rales, rhonchi,  wheezing Cardiac: regular Abdomen:  soft, NT/ND, no masses Skin: without rashes Vascular Exam/Pulses: Symmetrical radial pulses Extremities: Visible left basilic vein near the AC fossa; feet are warm and well-perfused without nonhealing wounds; superficial wounds of right shin without sign of infection or drainage Musculoskeletal: no muscle wasting or atrophy  Neurologic: A&O X 3;  No focal weakness or paresthesias are detected; speech is fluent/normal Psychiatric:  The pt has Normal affect. Lymph:  Unremarkable  CBC    Component Value Date/Time   WBC 5.0 02/09/2021 0423   RBC 2.84 (L) 02/09/2021 0423   HGB 7.4 (L) 02/09/2021 0423   HCT 23.5 (L) 02/09/2021 0423   PLT 137 (L) 02/09/2021 0423   MCV 82.7 02/09/2021 0423   MCH 26.1 02/09/2021 0423   MCHC 31.5 02/09/2021 0423   RDW 18.6 (H) 02/09/2021 0423   LYMPHSABS 0.6 (L) 10/29/2020 0248   MONOABS 0.6 10/29/2020 0248   EOSABS 0.2 10/29/2020 0248   BASOSABS 0.0 10/29/2020 0248    BMET    Component Value Date/Time   NA 137 02/09/2021 0423   NA 137 02/09/2021 0423   K 4.4 02/09/2021 0423   K 4.3 02/09/2021 0423   CL 113 (H) 02/09/2021 0423   CL 112 (H) 02/09/2021 0423   CO2 16 (L) 02/09/2021 0423   CO2  16 (L) 02/09/2021 0423   GLUCOSE 95 02/09/2021 0423   GLUCOSE 97 02/09/2021 0423   BUN 71 (H) 02/09/2021 0423   BUN 72 (H) 02/09/2021 0423   CREATININE 6.41 (H) 02/09/2021 0423   CREATININE 6.55 (H) 02/09/2021 0423   CALCIUM 7.6 (L) 02/09/2021 0423   CALCIUM 7.5 (L) 02/09/2021 0423   CALCIUM 7.3 (L) 10/29/2020 1528   GFRNONAA 9 (L) 02/09/2021 0423   GFRNONAA 9 (L) 02/09/2021 0423    COAGS: Lab Results  Component Value Date   INR 1.3 (H) 02/08/2021   INR 1.1 08/29/2020   INR 1.2 04/04/2020     Non-Invasive Vascular Imaging:    Vein mapping pending   ABI/TBIToday's ABIToday's TBIPrevious ABIPrevious TBI  +-------+-----------+-----------+------------+------------+  Right  0.64       0.16                                 +-------+-----------+-----------+------------+------------+  Left   0.81       0.27                                 +-------+-----------+-----------+------------+------------+   ASSESSMENT/PLAN: This is a 65 y.o. male seen in consultation for evaluation for permanent dialysis access.  Patient also had ABIs performed during hospitalization consistent with bilateral lower extremity PAD  -Patient is right arm dominant and would prefer access placement in his left arm; move left arm IV and restrict extremity -Vein mapping pending -Plan is for left arm AV fistula creation versus graft placement in the OR tomorrow with Dr. Virl Cagey -Risks of the procedure were discussed with the patient and he agrees to proceed -N.p.o, consent ordered  -Patient also has ABI and TBI consistent with PAD -Patient states he has episodic flareups of eczema which is what he is experiencing on his right leg currently -He does not have rest pain or nonhealing wounds of bilateral lower extremities -No indication for intervention for PAD currently.  This can be followed as an outpatient   Dagoberto Ligas PA-C [facility] [phone]  VASCULAR STAFF  ADDENDUM: I have independently interviewed and examined the patient. I agree with the above.  Plan LUE AVF vs. AVG tomorrow in OR with Dr. Virl Cagey Vein mapping pending.  Yevonne Aline. Stanford Breed, MD [facility] Phone Number: [phone] 02/09/2021 3:09 PM

## 2021-02-09 NOTE — Progress Notes (Signed)
Round on patient this morning as he is ready for conversation regarding necessity of starting HD. Patient with plan for tunneled HD catheter placement this morning by interventional radiology. Patient reports that he knows he will have to start HD here, but states that he will have to move back to [place] in the upcoming months. He reports that he has no family here to help care for him and he is ready for retirement. Patient also reports that he has some familiarity with HD as he has known people on dialysis in the past. Plan to watch HD ipad, but IR has come to pick up the patient. Will folllow-up round this afternoon with patient.  Dorthey Sawyer, RN  Dialysis Nurse Coordinator [phone]

## 2021-02-09 NOTE — Consult Note (Signed)
Chief Complaint: Patient was seen in consultation today for tunneled dialysis catheter placement Chief Complaint  Patient presents with   Groin Swelling    hypertention   at the request of Dr Marval Regal   Supervising Physician: Daryll Brod  Patient Status: [facility] - In-pt  History of Present Illness: Jeffrey Zuniga is a 65 y.o. male   HTN urgency AKI/CKD stage 5 Poorly controlled HTN Worsening creatinine Needs to initiate dialysis per Nephrology  Request for tunneled dialysis catheter placement Pt also scheduled for Vasc access sson  Past Medical History:  Diagnosis Date   Acute on chronic kidney failure ([place])    Anemia 04/04/2020   Blood transfusion without reported diagnosis    Diabetes mellitus without complication ([place])    GI bleed    H. pylori infection    Hemorrhoids    Hyperplastic colon polyp    Hypertension     Past Surgical History:  Procedure Laterality Date   BIOPSY  08/31/2020   Procedure: BIOPSY;  Surgeon: Irving Copas., MD;  Location: [facility] ENDOSCOPY;  Service: Gastroenterology;;   CARDIAC CATHETERIZATION     COLONOSCOPY WITH PROPOFOL N/A 08/31/2020   Procedure: COLONOSCOPY WITH PROPOFOL;  Surgeon: Irving Copas., MD;  Location: [facility] ENDOSCOPY;  Service: Gastroenterology;  Laterality: N/A;   ESOPHAGOGASTRODUODENOSCOPY (EGD) WITH PROPOFOL N/A 08/31/2020   Procedure: ESOPHAGOGASTRODUODENOSCOPY (EGD) WITH PROPOFOL;  Surgeon: Rush Landmark Telford Nab., MD;  Location: [place];  Service: Gastroenterology;  Laterality: N/A;   POLYPECTOMY  08/31/2020   Procedure: POLYPECTOMY;  Surgeon: Rush Landmark Telford Nab., MD;  Location: [place];  Service: Gastroenterology;;    Allergies: Patient has no known allergies.  Medications: Prior to Admission medications   Medication Sig Start Date End Date Taking? Authorizing Provider  amLODipine (NORVASC) 10 MG tablet Take 1 tablet (10 mg total) by mouth daily. 10/30/20  Yes Sanjuan Dame, MD   carvedilol (COREG) 12.5 MG tablet Take 1 tablet (12.5 mg total) by mouth 2 (two) times daily with a meal. 10/30/20 02/07/21 Yes Sanjuan Dame, MD  ferrous sulfate 325 (65 FE) MG tablet Take 1 tablet (325 mg total) by mouth daily with breakfast. 10/30/20  Yes Sanjuan Dame, MD  furosemide (LASIX) 40 MG tablet Take 1 tablet (40 mg total) by mouth 2 (two) times daily. 10/30/20 02/07/21 Yes Sanjuan Dame, MD  isosorbide mononitrate (IMDUR) 30 MG 24 hr tablet Take 1 tablet (30 mg total) by mouth daily. 10/30/20  Yes Sanjuan Dame, MD  pantoprazole (PROTONIX) 40 MG tablet Take 1 tablet (40 mg total) by mouth daily. 10/31/20  Yes Sanjuan Dame, MD  Amoxicill-Rifabutin-Omeprazole (TALICIA) 99991111 MG CPDR Take 4 tablets by mouth every 8 (eight) hours. Patient not taking: Reported on 02/07/2021 09/09/20   Pyrtle, Lajuan Lines, MD     No family history on file.  Social History   Socioeconomic History   Marital status: Single    Spouse name: Not on file   Number of children: Not on file   Years of education: Not on file   Highest education level: Not on file  Occupational History   Not on file  Tobacco Use   Smoking status: Every Day    Packs/day: 0.25    Types: Cigarettes   Smokeless tobacco: Never   Tobacco comments:    2021  " i AM CUTTING BACK :"  Vaping Use   Vaping Use: Never used  Substance and Sexual Activity   Alcohol use: Not Currently   Drug use: Never   Sexual activity: Not  on file  Other Topics Concern   Not on file  Social History Narrative   Not on file   Social Determinants of Health   Financial Resource Strain: Not on file  Food Insecurity: Not on file  Transportation Needs: Not on file  Physical Activity: Not on file  Stress: Not on file  Social Connections: Not on file    Review of Systems: A 12 point ROS discussed and pertinent positives are indicated in the HPI above.  All other systems are negative.  Review of Systems  Constitutional:  Positive  for fatigue. Negative for activity change and fever.  Respiratory:  Negative for cough and shortness of breath.   Cardiovascular:  Negative for chest pain.  Gastrointestinal:  Negative for abdominal pain.  Psychiatric/Behavioral:  Negative for behavioral problems and confusion.    Vital Signs: BP (!) 149/60 (BP Location: Right Arm)   Pulse 68   Temp 98.1 F (36.7 C) (Oral)   Resp 19   Ht '5\' 7"'$  (1.702 m)   Wt 175 lb 0.7 oz (79.4 kg)   SpO2 99%   BMI 27.42 kg/m   Physical Exam Vitals reviewed.  HENT:     Mouth/Throat:     Mouth: Mucous membranes are moist.  Cardiovascular:     Rate and Rhythm: Normal rate and regular rhythm.     Heart sounds: Normal heart sounds.  Pulmonary:     Effort: Pulmonary effort is normal.     Breath sounds: Normal breath sounds.  Abdominal:     Palpations: Abdomen is soft.  Musculoskeletal:        General: Normal range of motion.  Skin:    General: Skin is warm.  Neurological:     Mental Status: He is alert and oriented to person, place, and time.  Psychiatric:        Behavior: Behavior normal.    Imaging: DG Chest 2 View  Result Date: 02/07/2021 CLINICAL DATA:  History of diabetes and hypertension at age 37 now with bilateral lower extremity edema and leg swelling for 3 days. EXAM: CHEST - 2 VIEW COMPARISON:  Oct 28, 2020. FINDINGS: Findings trachea midline. Heart size remains enlarged. Central pulmonary structures and hilar structures are mildly engorged. No lobar consolidative process or sign of pleural effusion. No pneumothorax. On limited assessment no acute skeletal process. IMPRESSION: Cardiomegaly with mild central pulmonary vascular congestion. Electronically Signed   By: Zetta Bills M.D.   On: 02/07/2021 13:40   CT HEAD WO CONTRAST (5MM)  Result Date: 02/07/2021 CLINICAL DATA:  65 year old male with unilateral blurred vision by report. EXAM: CT HEAD WITHOUT CONTRAST TECHNIQUE: Contiguous axial images were obtained from the base of  the skull through the vertex without intravenous contrast. COMPARISON:  None FINDINGS: Brain: No evidence of acute infarction, hemorrhage, hydrocephalus, extra-axial collection or mass lesion/mass effect. Vascular: No hyperdense vessel or unexpected calcification. Skull: Normal. Negative for fracture or focal lesion. Sinuses/Orbits: Visualized paranasal sinuses and orbits are unremarkable. Other: None IMPRESSION: No acute intracranial abnormality. Electronically Signed   By: Zetta Bills M.D.   On: 02/07/2021 14:19   VAS [place] ABI WITH/WO TBI  Result Date: 02/08/2021  LOWER EXTREMITY DOPPLER STUDY Patient Name:  DAIR NECESSARY  Date of Exam:   02/08/2021 Medical Rec #: [id number]     Accession #:    [id number] Date of Birth: 1956-04-02     Patient Gender: M Patient Age:   76 years Exam Location:  [facility] Procedure:  VAS [place] ABI WITH/WO TBI Referring Phys: EMILY MULLEN --------------------------------------------------------------------------------  Indications: Peripheral artery disease. High Risk Factors: Hypertension, Diabetes.  Comparison Study: No prior studies. Performing Technologist: Carlos Levering RVT  Examination Guidelines: A complete evaluation includes at minimum, Doppler waveform signals and systolic blood pressure reading at the level of bilateral brachial, anterior tibial, and posterior tibial arteries, when vessel segments are accessible. Bilateral testing is considered an integral part of a complete examination. Photoelectric Plethysmograph (PPG) waveforms and toe systolic pressure readings are included as required and additional duplex testing as needed. Limited examinations for reoccurring indications may be performed as noted.  ABI Findings: +---------+------------------+-----+----------+--------+ Right    Rt Pressure (mmHg)IndexWaveform  Comment  +---------+------------------+-----+----------+--------+ Brachial 165                    triphasic           +---------+------------------+-----+----------+--------+ PTA      105               0.64 monophasic         +---------+------------------+-----+----------+--------+ DP       106               0.64 monophasic         +---------+------------------+-----+----------+--------+ Great Toe26                0.16                    +---------+------------------+-----+----------+--------+ +---------+------------------+-----+---------+-------+ Left     Lt Pressure (mmHg)IndexWaveform Comment +---------+------------------+-----+---------+-------+ Brachial 164                    triphasic        +---------+------------------+-----+---------+-------+ PTA      105               0.64 biphasic         +---------+------------------+-----+---------+-------+ DP       134               0.81 biphasic         +---------+------------------+-----+---------+-------+ Great Toe44                0.27                  +---------+------------------+-----+---------+-------+ +-------+-----------+-----------+------------+------------+ ABI/TBIToday's ABIToday's TBIPrevious ABIPrevious TBI +-------+-----------+-----------+------------+------------+ Right  0.64       0.16                                +-------+-----------+-----------+------------+------------+ Left   0.81       0.27                                +-------+-----------+-----------+------------+------------+  Summary: Right: Resting right ankle-brachial index indicates moderate right lower extremity arterial disease. The right toe-brachial index is abnormal. Left: Resting left ankle-brachial index indicates mild left lower extremity arterial disease. The left toe-brachial index is abnormal.  *See table(s) above for measurements and observations.  Electronically signed by Harold Barban MD on 02/08/2021 at 6:27:45 PM.    Final    ECHOCARDIOGRAM COMPLETE  Result Date: 02/08/2021    ECHOCARDIOGRAM REPORT   Patient Name:    BETTYE NIECE Date of Exam: 02/08/2021 Medical Rec #:  [id number]    Height:       67.0 in Accession #:  [id number]   Weight:       183.9 lb Date of Birth:  03/15/1956    BSA:          1.951 m Patient Age:    10 years     BP:           145/73 mmHg Patient Gender: M            HR:           68 bpm. Exam Location:  Inpatient Procedure: 2D Echo, Color Doppler and Cardiac Doppler Indications:    Congestive Heart Failure I50.9  History:        Patient has prior history of Echocardiogram examinations, most                 recent 10/29/2020. Risk Factors:Diabetes and Hypertension.  Sonographer:    Bernadene Person RDCS Referring Phys: [place]  1. Left ventricular ejection fraction, by estimation, is 55 to 60%. The left ventricle has normal function. The left ventricle has no regional wall motion abnormalities. There is severe concentric left ventricular hypertrophy. Left ventricular diastolic  parameters are consistent with Grade II diastolic dysfunction (pseudonormalization). Elevated left ventricular end-diastolic pressure.  2. Right ventricular systolic function is normal. The right ventricular size is normal. There is severely elevated pulmonary artery systolic pressure. The estimated right ventricular systolic pressure is [id number] mmHg.  3. Left atrial size was moderately dilated.  4. Right atrial size was moderately dilated.  5. A small pericardial effusion is present. The pericardial effusion is circumferential. There is no evidence of cardiac tamponade.  6. The mitral valve is grossly normal. Mild mitral valve regurgitation. No evidence of mitral stenosis.  7. Tricuspid valve regurgitation is mild to moderate.  8. The aortic valve is grossly normal. Aortic valve regurgitation is trivial. Mild aortic valve sclerosis is present, with no evidence of aortic valve stenosis.  9. The inferior vena cava is dilated in size with <50% respiratory variability, suggesting right atrial pressure of 15 mmHg.  Comparison(s): Prior images reviewed side by side. Conclusion(s)/Recommendation(s): Severe concentric LVH, myocardium appearance concerning for infiltrative cardiomyopathy. Consider workup including PYP scan. FINDINGS  Left Ventricle: Left ventricular ejection fraction, by estimation, is 55 to 60%. The left ventricle has normal function. The left ventricle has no regional wall motion abnormalities. The left ventricular internal cavity size was normal in size. There is  severe concentric left ventricular hypertrophy. Left ventricular diastolic parameters are consistent with Grade II diastolic dysfunction (pseudonormalization). Elevated left ventricular end-diastolic pressure. Right Ventricle: The right ventricular size is normal. No increase in right ventricular wall thickness. Right ventricular systolic function is normal. There is severely elevated pulmonary artery systolic pressure. The tricuspid regurgitant velocity is 3.37 m/s, and with an assumed right atrial pressure of 15 mmHg, the estimated right ventricular systolic pressure is [id number] mmHg. Left Atrium: Left atrial size was moderately dilated. Right Atrium: Right atrial size was moderately dilated. Pericardium: A small pericardial effusion is present. The pericardial effusion is circumferential. There is no evidence of cardiac tamponade. Mitral Valve: The mitral valve is grossly normal. There is mild thickening of the mitral valve leaflet(s). Mild mitral valve regurgitation. No evidence of mitral valve stenosis. Tricuspid Valve: The tricuspid valve is normal in structure. Tricuspid valve regurgitation is mild to moderate. No evidence of tricuspid stenosis. Aortic Valve: The aortic valve is grossly normal. Aortic valve regurgitation is trivial. Mild aortic valve sclerosis is present, with no evidence of aortic valve stenosis.  Pulmonic Valve: The pulmonic valve was grossly normal. Pulmonic valve regurgitation is mild. No evidence of pulmonic stenosis. Aorta:  The aortic root, ascending aorta, aortic arch and descending aorta are all structurally normal, with no evidence of dilitation or obstruction. Venous: The inferior vena cava is dilated in size with less than 50% respiratory variability, suggesting right atrial pressure of 15 mmHg. IAS/Shunts: The atrial septum is grossly normal.  LEFT VENTRICLE PLAX 2D LVIDd:         5.30 cm      Diastology LVIDs:         3.60 cm      LV e' medial:    4.11 cm/s LV PW:         1.40 cm      LV E/e' medial:  29.4 LV IVS:        1.10 cm      LV e' lateral:   5.35 cm/s LVOT diam:     2.00 cm      LV E/e' lateral: 22.6 LV SV:         79 LV SV Index:   40 LVOT Area:     3.14 cm  LV Volumes (MOD) LV vol d, MOD A2C: 168.0 ml LV vol d, MOD A4C: 174.0 ml LV vol s, MOD A2C: 90.5 ml LV vol s, MOD A4C: 82.9 ml LV SV MOD A2C:     77.5 ml LV SV MOD A4C:     174.0 ml LV SV MOD BP:      84.0 ml RIGHT VENTRICLE RV S prime:     14.10 cm/s TAPSE (M-mode): 2.3 cm LEFT ATRIUM             Index       RIGHT ATRIUM           Index LA diam:        4.10 cm 2.10 cm/m  RA Area:     20.30 cm LA Vol (A2C):   61.5 ml 31.52 ml/m RA Volume:   58.80 ml  30.13 ml/m LA Vol (A4C):   57.6 ml 29.52 ml/m LA Biplane Vol: 64.0 ml 32.80 ml/m  AORTIC VALVE LVOT Vmax:   124.00 cm/s LVOT Vmean:  79.600 cm/s LVOT VTI:    0.251 m  AORTA Ao Root diam: 3.00 cm Ao Asc diam:  3.40 cm MITRAL VALVE                TRICUSPID VALVE MV Area (PHT): 3.89 cm     TR Peak grad:   45.4 mmHg MV Decel Time: 195 msec     TR Vmax:        337.00 cm/s MV E velocity: 121.00 cm/s MV A velocity: 77.70 cm/s   SHUNTS MV E/A ratio:  1.56         Systemic VTI:  0.25 m                             Systemic Diam: 2.00 cm Buford Dresser MD Electronically signed by Buford Dresser MD Signature Date/Time: 02/08/2021/11:20:09 AM    Final    US SCROTUM W/DOPPLER  Result Date: 02/07/2021 CLINICAL DATA:  Testicular pain in a 65 year old male. EXAM: SCROTAL ULTRASOUND DOPPLER ULTRASOUND OF THE  TESTICLES TECHNIQUE: Complete ultrasound examination of the testicles, epididymis, and other scrotal structures was performed. Color and spectral Doppler ultrasound were also utilized to evaluate blood flow to the testicles. COMPARISON:  Oct 28, 2020. FINDINGS: Right testicle Measurements: 3.9 x 2.8 x 2.4 cm. No visible mass, signs of abundant microlithiasis. Left testicle Measurements: 3.3 x 2.5 x 2.5 cm. No visible mass, signs of abundant microlithiasis. Right epididymis:  Normal in size and appearance. Left epididymis:  Normal in size and appearance. Hydrocele:  Bilateral hydroceles similar to the prior study. Varicocele:  None visualized. Pulsed Doppler interrogation of both testes demonstrates normal low resistance arterial and venous waveforms bilaterally. IMPRESSION: No signs of testicular torsion or testicular mass. No findings to indicate epididymitis or orchitis. Bilateral hydroceles slightly greater on the RIGHT. Signs of scrotal thickening diffusely is largely similar to the prior study. Correlate with any signs of edema or cellulitis. 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 Current literature suggests that testicular microlithiasis is not a significant independent risk factor for development of testicular carcinoma, and that follow up imaging is not warranted in the absence of other risk factors. Monthly testicular self-examination and annual physical exams are considered appropriate surveillance. If patient has other risk factors for testicular carcinoma, then referral to Urology should be considered. (Reference: DeCastro, et al.: A 5-Year Follow up Study of Asymptomatic Men with Testicular Microlithiasis. J Urol 2008; [id number].) Electronically Signed   By: Zetta Bills M.D.   On: 02/07/2021 18:44    Labs:  CBC: Recent Labs    10/30/20 0209 02/07/21 1250 02/08/21 0349 02/08/21 1111 02/09/21 0423  WBC 5.8 5.0 4.7  --  5.0  HGB 7.4* 5.5* 6.8* 7.5* 7.4*  HCT 23.7*  18.9* 21.5* 23.5* 23.5*  PLT 164 156 145*  --  137*    COAGS: Recent Labs    04/04/20 1534 08/29/20 1338 02/08/21 0349  INR 1.2 1.1 1.3*  APTT  --   --  34    BMP: Recent Labs    10/30/20 0209 02/07/21 1250 02/08/21 0349 02/09/21 0423  NA 137 137 138 137  137  K 4.2 5.3* 4.6 4.4  4.3  CL 111 113* 114* 113*  112*  CO2 19* 16* 17* 16*  16*  GLUCOSE 112* 115* 93 95  97  BUN 48* 68* 66* 71*  72*  CALCIUM 7.6* 7.8* 7.8* 7.6*  7.5*  CREATININE 5.16* 6.13* 5.98* 6.41*  6.55*  GFRNONAA 12* 10* 10* 9*  9*    LIVER FUNCTION TESTS: Recent Labs    08/29/20 0755 08/30/20 0201 10/28/20 1012 10/29/20 0248 02/08/21 0349 02/09/21 0423  BILITOT 0.8 1.2 0.4 0.4  --   --   AST 46* '31 30 21  '$ --   --   ALT 33 '28 21 17  '$ --   --   ALKPHOS 83 70 84 66  --   --   PROT 6.9 6.3* 6.9 6.0*  --   --   ALBUMIN 2.6* 2.3* 2.4* 2.0* 2.6* 2.6*    TUMOR MARKERS: No results for input(s): AFPTM, CEA, CA199, CHROMGRNA in the last 8760 hours.  Assessment and Plan:  AKI/CKD stage 5 For initiation of dialysis Scheduled for tunneled dialysis catheter placement in IR today Risks and benefits discussed with the patient including, but not limited to bleeding, infection, vascular injury, pneumothorax which may require chest tube placement, air embolism or even death  All of the patient's questions were answered, patient is agreeable to proceed. Consent signed and in chart.   Thank you for this interesting consult.  I greatly enjoyed meeting Lowry Benthall and look forward to participating in their care.  A copy of this report  was sent to the requesting provider on this date.  Electronically Signed: Lavonia Drafts, PA-C 02/09/2021, 8:36 AM   I spent a total of 20 Minutes    in face to face in clinical consultation, greater than 50% of which was counseling/coordinating care for tunneled dialysis catheter placement

## 2021-02-10 ENCOUNTER — Encounter (HOSPITAL_COMMUNITY)
Admission: EM | Disposition: A | Discharge status: Home / Self Care | Payer: Self-pay | Source: Home / Self Care | Attending: Internal Medicine

## 2021-02-10 ENCOUNTER — Inpatient Hospital Stay (HOSPITAL_COMMUNITY): Payer: 59 | Admitting: Certified Registered"

## 2021-02-10 ENCOUNTER — Encounter (HOSPITAL_COMMUNITY): Payer: Self-pay | Admitting: Internal Medicine

## 2021-02-10 DIAGNOSIS — Z992 Dependence on renal dialysis: Secondary | ICD-10-CM

## 2021-02-10 DIAGNOSIS — N186 End stage renal disease: Secondary | ICD-10-CM

## 2021-02-10 DIAGNOSIS — N185 Chronic kidney disease, stage 5: Secondary | ICD-10-CM | POA: Diagnosis not present

## 2021-02-10 DIAGNOSIS — N179 Acute kidney failure, unspecified: Secondary | ICD-10-CM | POA: Diagnosis not present

## 2021-02-10 HISTORY — PX: AV FISTULA PLACEMENT: SHX1204

## 2021-02-10 LAB — RENAL FUNCTION PANEL
Albumin: 2.7 g/dL — ABNORMAL LOW (ref 3.5–5.0)
Anion gap: 8 (ref 5–15)
BUN: 74 mg/dL — ABNORMAL HIGH (ref 8–23)
CO2: 19 mmol/L — ABNORMAL LOW (ref 22–32)
Calcium: 7.5 mg/dL — ABNORMAL LOW (ref 8.9–10.3)
Chloride: 108 mmol/L (ref 98–111)
Creatinine, Ser: 6.82 mg/dL — ABNORMAL HIGH (ref 0.61–1.24)
GFR, Estimated: 8 mL/min — ABNORMAL LOW (ref >60)
Glucose, Bld: 103 mg/dL — ABNORMAL HIGH (ref 70–99)
Phosphorus: 5 mg/dL — ABNORMAL HIGH (ref 2.5–4.6)
Potassium: 4.4 mmol/L (ref 3.5–5.1)
Sodium: 135 mmol/L (ref 135–145)

## 2021-02-10 LAB — CBC
HCT: 25.7 % — ABNORMAL LOW (ref 39.0–52.0)
Hemoglobin: 8.1 g/dL — ABNORMAL LOW (ref 13.0–17.0)
MCH: 25.8 pg — ABNORMAL LOW (ref 26.0–34.0)
MCHC: 31.5 g/dL (ref 30.0–36.0)
MCV: 81.8 fL (ref 80.0–100.0)
Platelets: 167 10*3/uL (ref 150–400)
RBC: 3.14 MIL/uL — ABNORMAL LOW (ref 4.22–5.81)
RDW: 18.5 % — ABNORMAL HIGH (ref 11.5–15.5)
WBC: 6.1 10*3/uL (ref 4.0–10.5)
nRBC: 0 % (ref 0.0–0.2)

## 2021-02-10 LAB — POCT I-STAT, CHEM 8
BUN: 44 mg/dL — ABNORMAL HIGH (ref 8–23)
Calcium, Ion: 1.02 mmol/L — ABNORMAL LOW (ref 1.15–1.40)
Chloride: 104 mmol/L (ref 98–111)
Creatinine, Ser: 4.7 mg/dL — ABNORMAL HIGH (ref 0.61–1.24)
Glucose, Bld: 99 mg/dL (ref 70–99)
HCT: 29 % — ABNORMAL LOW (ref 39.0–52.0)
Hemoglobin: 9.9 g/dL — ABNORMAL LOW (ref 13.0–17.0)
Potassium: 3.7 mmol/L (ref 3.5–5.1)
Sodium: 140 mmol/L (ref 135–145)
TCO2: 23 mmol/L (ref 22–32)

## 2021-02-10 LAB — GLUCOSE, CAPILLARY
Glucose-Capillary: 102 mg/dL — ABNORMAL HIGH (ref 70–99)
Glucose-Capillary: 98 mg/dL (ref 70–99)
Glucose-Capillary: 89 mg/dL (ref 70–99)

## 2021-02-10 LAB — HEPATITIS B SURFACE ANTIGEN: Hepatitis B Surface Ag: NONREACTIVE

## 2021-02-10 LAB — HEPATITIS B SURFACE ANTIBODY,QUALITATIVE: Hep B S Ab: NONREACTIVE

## 2021-02-10 LAB — HEPATITIS B CORE ANTIBODY, TOTAL: Hep B Core Total Ab: NONREACTIVE

## 2021-02-10 SURGERY — ARTERIOVENOUS (AV) FISTULA CREATION
Anesthesia: General | Laterality: Left

## 2021-02-10 MED ORDER — FENTANYL CITRATE (PF) 250 MCG/5ML IJ SOLN
INTRAMUSCULAR | Status: AC
  Filled 2021-02-10: qty 5

## 2021-02-10 MED ORDER — CEFAZOLIN SODIUM-DEXTROSE 2-4 GM/100ML-% IV SOLN
2.0000 g | Freq: Once | INTRAVENOUS | Status: DC
  Filled 2021-02-10: qty 100

## 2021-02-10 MED ORDER — CHLORHEXIDINE GLUCONATE 0.12 % MT SOLN
OROMUCOSAL | Status: AC
  Administered 2021-02-10: 15 mL via OROMUCOSAL
  Filled 2021-02-10: qty 15

## 2021-02-10 MED ORDER — DEXAMETHASONE SODIUM PHOSPHATE 10 MG/ML IJ SOLN
INTRAMUSCULAR | Status: DC | PRN
Start: 2021-02-10 — End: 2021-02-10
  Administered 2021-02-10: 4 mg via INTRAVENOUS

## 2021-02-10 MED ORDER — ONDANSETRON HCL 4 MG/2ML IJ SOLN: 4.0000 mg | Freq: Once | INTRAMUSCULAR | Status: DC | PRN

## 2021-02-10 MED ORDER — MIDAZOLAM HCL 5 MG/5ML IJ SOLN
INTRAMUSCULAR | Status: DC | PRN
  Administered 2021-02-10: 2 mg via INTRAVENOUS

## 2021-02-10 MED ORDER — LACTATED RINGERS IV SOLN: INTRAVENOUS | Status: DC

## 2021-02-10 MED ORDER — NEPRO/CARBSTEADY PO LIQD
237.0000 mL | ORAL | Status: DC
  Administered 2021-02-11 – 2021-02-15 (×3): 237 mL via ORAL

## 2021-02-10 MED ORDER — MIDAZOLAM HCL 2 MG/2ML IJ SOLN
INTRAMUSCULAR | Status: AC
  Filled 2021-02-10: qty 2

## 2021-02-10 MED ORDER — ORAL CARE MOUTH RINSE: 15.0000 mL | Freq: Once | OROMUCOSAL | Status: AC

## 2021-02-10 MED ORDER — 0.9 % SODIUM CHLORIDE (POUR BTL) OPTIME
TOPICAL | Status: DC | PRN
  Administered 2021-02-10: 1000 mL

## 2021-02-10 MED ORDER — CEFAZOLIN SODIUM-DEXTROSE 2-4 GM/100ML-% IV SOLN
INTRAVENOUS | Status: AC
  Filled 2021-02-10: qty 100

## 2021-02-10 MED ORDER — HEPARIN 6000 UNIT IRRIGATION SOLUTION
Status: DC | PRN
  Administered 2021-02-10: 1

## 2021-02-10 MED ORDER — STERILE WATER FOR IRRIGATION IR SOLN
Status: DC | PRN
  Administered 2021-02-10: 1000 mL

## 2021-02-10 MED ORDER — PROPOFOL 10 MG/ML IV BOLUS
INTRAVENOUS | Status: AC
  Filled 2021-02-10: qty 20

## 2021-02-10 MED ORDER — PROPOFOL 10 MG/ML IV BOLUS
INTRAVENOUS | Status: DC | PRN
  Administered 2021-02-10: 140 mg via INTRAVENOUS

## 2021-02-10 MED ORDER — FENTANYL CITRATE (PF) 100 MCG/2ML IJ SOLN: 25.0000 ug | INTRAMUSCULAR | Status: DC | PRN

## 2021-02-10 MED ORDER — HEPARIN 6000 UNIT IRRIGATION SOLUTION
Status: AC
  Filled 2021-02-10: qty 500

## 2021-02-10 MED ORDER — LIDOCAINE 2% (20 MG/ML) 5 ML SYRINGE
INTRAMUSCULAR | Status: DC | PRN
  Administered 2021-02-10: 100 mg via INTRAVENOUS

## 2021-02-10 MED ORDER — HEPARIN SODIUM (PORCINE) 1000 UNIT/ML IJ SOLN
INTRAMUSCULAR | Status: AC
  Filled 2021-02-10: qty 4

## 2021-02-10 MED ORDER — LIDOCAINE-EPINEPHRINE (PF) 1 %-1:200000 IJ SOLN
INTRAMUSCULAR | Status: AC
  Filled 2021-02-10: qty 30

## 2021-02-10 MED ORDER — CHLORHEXIDINE GLUCONATE 0.12 % MT SOLN: 15.0000 mL | Freq: Once | OROMUCOSAL | Status: AC

## 2021-02-10 MED ORDER — FENTANYL CITRATE (PF) 250 MCG/5ML IJ SOLN
INTRAMUSCULAR | Status: DC | PRN
  Administered 2021-02-10: 50 ug via INTRAVENOUS

## 2021-02-10 MED ORDER — CEFAZOLIN SODIUM-DEXTROSE 2-3 GM-%(50ML) IV SOLR
INTRAVENOUS | Status: DC | PRN
  Administered 2021-02-10: 2 g via INTRAVENOUS

## 2021-02-10 MED ORDER — ACETAMINOPHEN 10 MG/ML IV SOLN: 1000.0000 mg | Freq: Once | INTRAVENOUS | Status: DC | PRN

## 2021-02-10 MED ORDER — PAPAVERINE HCL 30 MG/ML IJ SOLN
INTRAMUSCULAR | Status: AC
  Filled 2021-02-10: qty 2

## 2021-02-10 MED ORDER — RENA-VITE PO TABS
1.0000 | ORAL_TABLET | Freq: Every day | ORAL | Status: DC
  Administered 2021-02-10 – 2021-02-14 (×5): 1 via ORAL
  Filled 2021-02-10 (×5): qty 1

## 2021-02-10 MED ORDER — PHENYLEPHRINE HCL-NACL 20-0.9 MG/250ML-% IV SOLN
INTRAVENOUS | Status: DC | PRN
  Administered 2021-02-10: 30 ug/min via INTRAVENOUS

## 2021-02-10 MED ORDER — SODIUM CHLORIDE 0.9 % IV SOLN: INTRAVENOUS | Status: DC

## 2021-02-10 SURGICAL SUPPLY — 36 items
ARMBAND PINK RESTRICT EXTREMIT (MISCELLANEOUS) ×2 IMPLANT
BAG COUNTER SPONGE SURGICOUNT (BAG) ×2 IMPLANT
CANISTER SUCT 3000ML PPV (MISCELLANEOUS) ×2 IMPLANT
CLIP LIGATING EXTRA MED SLVR (CLIP) ×2 IMPLANT
CLIP LIGATING EXTRA SM BLUE (MISCELLANEOUS) ×2 IMPLANT
CLIP VESOCCLUDE MED 6/CT (CLIP) ×1 IMPLANT
CLIP VESOCCLUDE SM WIDE 6/CT (CLIP) ×1 IMPLANT
COVER PROBE W GEL 5X96 (DRAPES) ×1 IMPLANT
DERMABOND ADVANCED (GAUZE/BANDAGES/DRESSINGS) ×1
DERMABOND ADVANCED .7 DNX12 (GAUZE/BANDAGES/DRESSINGS) ×1 IMPLANT
ELECT REM PT RETURN 9FT ADLT (ELECTROSURGICAL) ×2
ELECTRODE REM PT RTRN 9FT ADLT (ELECTROSURGICAL) ×1 IMPLANT
GAUZE 4X4 16PLY ~~LOC~~+RFID DBL (SPONGE) ×1 IMPLANT
GLOVE SRG 8 PF TXTR STRL LF DI (GLOVE) IMPLANT
GLOVE SURG ENC MOIS LTX SZ7.5 (GLOVE) ×2 IMPLANT
GLOVE SURG UNDER POLY LF SZ6 (GLOVE) ×1 IMPLANT
GLOVE SURG UNDER POLY LF SZ8 (GLOVE) ×1
GOWN STRL REUS W/ TWL LRG LVL3 (GOWN DISPOSABLE) ×2 IMPLANT
GOWN STRL REUS W/ TWL XL LVL3 (GOWN DISPOSABLE) ×1 IMPLANT
GOWN STRL REUS W/TWL 2XL LVL3 (GOWN DISPOSABLE) ×2 IMPLANT
GOWN STRL REUS W/TWL LRG LVL3 (GOWN DISPOSABLE) ×2
GOWN STRL REUS W/TWL XL LVL3 (GOWN DISPOSABLE) ×1
INSERT FOGARTY SM (MISCELLANEOUS) IMPLANT
KIT BASIN OR (CUSTOM PROCEDURE TRAY) ×2 IMPLANT
KIT TURNOVER KIT B (KITS) ×2 IMPLANT
NS IRRIG 1000ML POUR BTL (IV SOLUTION) ×2 IMPLANT
PACK CV ACCESS (CUSTOM PROCEDURE TRAY) ×2 IMPLANT
PAD ARMBOARD 7.5X6 YLW CONV (MISCELLANEOUS) ×4 IMPLANT
SPONGE T-LAP 18X18 ~~LOC~~+RFID (SPONGE) ×1 IMPLANT
SUT MNCRL AB 4-0 PS2 18 (SUTURE) ×2 IMPLANT
SUT PROLENE 6 0 BV (SUTURE) ×4 IMPLANT
SUT VIC AB 3-0 SH 27 (SUTURE) ×1
SUT VIC AB 3-0 SH 27X BRD (SUTURE) ×1 IMPLANT
TOWEL GREEN STERILE (TOWEL DISPOSABLE) ×2 IMPLANT
UNDERPAD 30X36 HEAVY ABSORB (UNDERPADS AND DIAPERS) ×2 IMPLANT
WATER STERILE IRR 1000ML POUR (IV SOLUTION) ×2 IMPLANT

## 2021-02-10 NOTE — Anesthesia Preprocedure Evaluation (Signed)
Anesthesia Evaluation  Patient identified by MRN, date of birth, ID band Patient awake    Reviewed: Allergy & Precautions, NPO status , Patient's Chart, lab work & pertinent test results  Airway Mallampati: II  TM Distance: >3 FB Neck ROM: Full    Dental no notable dental hx.    Pulmonary Current Smoker,    Pulmonary exam normal breath sounds clear to auscultation       Cardiovascular hypertension, Normal cardiovascular exam Rhythm:Regular Rate:Normal  Left ventricular ejection fraction, by estimation, is 55 to 60%. The  left ventricle has normal function. The left ventricle has no regional  wall motion abnormalities. There is severe concentric left ventricular  hypertrophy. Left ventricular diastolic  parameters are consistent with Grade II diastolic dysfunction  (pseudonormalization). Elevated left ventricular end-diastolic pressure.  2. Right ventricular systolic function is normal. The right ventricular  size is normal. There is severely elevated pulmonary artery systolic  pressure. The estimated right ventricular systolic pressure is [id number] mmHg.  3. Left atrial size was moderately dilated.  4. Right atrial size was moderately dilated.  5. A small pericardial effusion is present. The pericardial effusion is  circumferential. There is no evidence of cardiac tamponade.  6. The mitral valve is grossly normal. Mild mitral valve regurgitation.  No evidence of mitral stenosis.  7. Tricuspid valve regurgitation is mild to moderate.  8. The aortic valve is grossly normal. Aortic valve regurgitation is  trivial. Mild aortic valve sclerosis is present, with no evidence of  aortic valve stenosis.  9. The inferior vena cava is dilated in size with <50% respiratory    Neuro/Psych negative neurological ROS  negative psych ROS   GI/Hepatic negative GI ROS, Neg liver ROS,   Endo/Other  diabetes  Renal/GU DialysisRenal  disease  negative genitourinary   Musculoskeletal negative musculoskeletal ROS (+)   Abdominal   Peds negative pediatric ROS (+)  Hematology  (+) anemia ,   Anesthesia Other Findings   Reproductive/Obstetrics negative OB ROS                             Anesthesia Physical Anesthesia Plan  ASA: 3  Anesthesia Plan: General   Post-op Pain Management:    Induction: Intravenous  PONV Risk Score and Plan: 1 and Ondansetron, Dexamethasone and Treatment may vary due to age or medical condition  Airway Management Planned: LMA  Additional Equipment:   Intra-op Plan:   Post-operative Plan: Extubation in OR  Informed Consent: I have reviewed the patients History and Physical, chart, labs and discussed the procedure including the risks, benefits and alternatives for the proposed anesthesia with the patient or authorized representative who has indicated his/her understanding and acceptance.     Dental advisory given  Plan Discussed with: CRNA and Surgeon  Anesthesia Plan Comments:         Anesthesia Quick Evaluation

## 2021-02-10 NOTE — Transfer of Care (Signed)
Immediate Anesthesia Transfer of Care Note  Patient: Jeffrey Zuniga  Procedure(s) Performed: LEFT UPPER EXTREMITY ARTERIOVENOUS (AV) FISTULA  CREATION (Left)  Patient Location: PACU  Anesthesia Type:General  Level of Consciousness: drowsy and patient cooperative  Airway & Oxygen Therapy: Patient Spontanous Breathing  Post-op Assessment: Report given to RN and Post -op Vital signs reviewed and stable  Post vital signs: Reviewed and stable  Last Vitals:  Vitals Value Taken Time  BP 152/75 02/10/21 1451  Temp 36.6 C 02/10/21 1450  Pulse 63 02/10/21 1502  Resp 15 02/10/21 1502  SpO2 98 % 02/10/21 1502  Vitals shown include unvalidated device data.  Last Pain:  Vitals:   02/10/21 1450  TempSrc:   PainSc: Asleep         Complications: No notable events documented.

## 2021-02-10 NOTE — Progress Notes (Signed)
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 Net UF 2L. Attached renal diet education to discharge instructions and will follow-up as able for education needs.   Pt admitted with edema. Per RN edema assessment, pt with moderate pitting edema to BLE and deep pitting edema to perineal region. Pre-HD weight was 76.2 kg and post-HD weight today was 70.2 kg. Will use this weight to estimate needs and will continue monitoring I&Os and weight trends to determine pt's dry weight.   PO Intake: 100% x 1 recorded meal  Medications: protonix, lokelma, IV lasix Labs:  Recent Labs  Lab 02/07/21 1550 02/08/21 0349 02/09/21 0423 02/10/21 0503 02/10/21 1145  NA  --  138 137  137 135 140  K  --  4.6 4.4  4.3 4.4 3.7  CL  --  114* 113*  112* 108 104  CO2  --  17* 16*  16* 19*  --   BUN  --  66* 71*  72* 74* 44*  CREATININE  --  5.98* 6.41*  6.55* 6.82* 4.70*  CALCIUM  --  7.8* 7.6*  7.5* 7.5*  --   MG 1.9  --   --   --   --   PHOS  --  5.2* 5.4* 5.0*  --   GLUCOSE  --  93 95  97 103* 99  Ionized calcium 1.02 (L) CBGs 98-102 x 24  hours  UOP: 2.1L x24 hours I/O: -4.5L since admit  NUTRITION - FOCUSED PHYSICAL EXAM: Unable to perform at this time. Will attempt at follow-up.  Diet Order:   Diet Order             Diet NPO time specified  Diet effective midnight                  EDUCATION NEEDS:  Education needs have been addressed  Skin:  Skin Assessment: Reviewed RN Assessment  Last BM:  PTA  Height:  Ht Readings from Last 1 Encounters:  02/08/21 '5\' 7"'$  (1.702 m)   Weight:  Wt Readings from Last 1 Encounters:  02/10/21 70.2 kg   BMI:  Body mass index is 24.24 kg/m.  Estimated Nutritional Needs:  Kcal:  2100-2300 Protein:  105-115 grams Fluid:  1L+UOP    Larkin Ina, MS, RD, LDN (she/her/hers) RD pager number and weekend/on-call pager number located in [place].

## 2021-02-10 NOTE — Progress Notes (Addendum)
HD#2 SUBJECTIVE:  Patient Summary: Jeffrey Zuniga is a 65 y.o. 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   Overnight Events: Dressing surrounding tunneled dialysis catheter saturated with blood, was replaced.  Interim History: Mr. Bomgardner was seen at bedside while receiving HD. He reports feeling well overall.  OBJECTIVE:  Vital Signs: Vitals:   02/10/21 1024 02/10/21 1048 02/10/21 1128 02/10/21 1150  BP: (!) 195/91   (!) 218/81  Pulse: 75 74 74   Resp: '19 18 17   '$ Temp: 98.2 F (36.8 C) 97.8 F (36.6 C) 98.1 F (36.7 C)   TempSrc: Oral Oral    SpO2: 98% 100% 97%   Weight: 70.2 kg     Height:       Supplemental O2: Room Air SpO2: 97 % O2 Flow Rate (L/min): 2 L/min  Filed Weights   02/09/21 0448 02/10/21 0500 02/10/21 1024  Weight: 79.4 kg 76.2 kg 70.2 kg     Intake/Output Summary (Last 24 hours) at 02/10/2021 1348 Last data filed at 02/10/2021 1142 Gross per 24 hour  Intake 685 ml  Output 3650 ml  Net -2965 ml   Net IO Since Admission: -4,571.67 mL [02/10/21 1348]  Physical Exam: Constitutional: Patient laying comfortably in bed receiving hemodialysis. Reports needing to urinate on completion of exam. No acute distress Cardio: Regular rate and rhythm, no murmurs/rubs/gallops Pulm: Clear to auscultation bilaterally, normal work of breathing Chest: Hemodialysis port with clean dressing over R upper aspect of chest. Abdomen: Soft, non-distended, non-tender MSK: No lower extremity edema Skin: Warm and dry. Venous stasis discoloration over bilateral lower legs. Neuro: No focal deficit noted. Alert and oriented x 3.  Patient Lines/Drains/Airways Status     Active Line/Drains/Airways     Name Placement date Placement time Site Days    Peripheral IV 02/10/21 18 G Posterior;Proximal;Right Forearm 02/10/21  1140  Forearm  less than 1   Hemodialysis Catheter Right Internal jugular Double lumen Permanent (Tunneled) 02/09/21  0958  Internal jugular  1             ASSESSMENT/PLAN:  Assessment: Active Problems:   Symptomatic anemia   Accelerated hypertension   Acute kidney injury ([place])   Bilateral lower extremity edema   Acute on chronic combined systolic and diastolic congestive heart failure (HCC)   Acute on chronic renal failure ([place])   Plan: #Hypertensive emergency #Chronic uncontrolled hypertension BP remains consistently elevated with AM recording of 164/82. Though this is an improvement, he still lacks adequate BP control. HD was performed this morning which will hopefully allow better volume and BP control. - Continue amlodipine 10 mg - Continue carvedilol 25 mg BID - Continue Imdur 30 mg daily; consider increasing to 60 mg if BP continues to be persistently elevated after HD - PRN hydralazine 10 mg IV q4h for SBP >180  #AKI on CKD stage V, now ESRD Right sided tunneled dialysis catheter placed 02/09/2021 with initiation of HD today. Patient undergoing procedure for L AV fistula vs. L AV graft today. - Nephrology and vascular on board, appreciate recommendations - Discontinued Lasix 120 mg - CLIP process started  #Acute on Chronic diastolic heart failure LE edema continues to improve. HD initiated today. Patient is net -4.571 L since admission, down 3.6 kg since admission. - Discontinued Lasix 120 mg -  Strict I/Os and daily weights  #Hyperkalemia, resolved - Follow BMP  #Venous stasis ulcers vs. Ecchymosis ABI's 02/08/2021 unremarkable. Discoloration is likely related to venous stasis.   Prior to Admission Living Arrangement: Anticipated Discharge Location: Barriers to Discharge: Dispo: Anticipated discharge pending clinical improvement.   Signature: Farrel Gordon, D.O.  Internal Medicine  Resident, PGY-1 [facility]  Pager: [phone] 1:48 PM, 02/10/2021   Please contact the on call pager after 5 pm and on weekends at [phone].

## 2021-02-10 NOTE — Anesthesia Procedure Notes (Signed)
Date/Time: 02/10/2021 2:00 PM Performed by: Griffin Dakin, CRNA Pre-anesthesia Checklist: Patient identified, Emergency Drugs available, Suction available, Patient being monitored and Timeout performed Patient Re-evaluated:Patient Re-evaluated prior to induction Oxygen Delivery Method: Circle system utilized Preoxygenation: Pre-oxygenation with 100% oxygen Induction Type: IV induction Ventilation: Mask ventilation without difficulty LMA: LMA inserted LMA Size: 5.0 Placement Confirmation: positive ETCO2 and breath sounds checked- equal and bilateral Tube secured with: Tape Dental Injury: Teeth and Oropharynx as per pre-operative assessment

## 2021-02-10 NOTE — Progress Notes (Signed)
Requested to see pt for new out-pt arrangements. Unable to meet with pt at bedside due to pt being unavailable. Due to pt's insurance, referral made to [facility] which is the closest clinic to pt's home address that is in-network with pt's insurance. Records sent to [facility] for review.  Melven Sartorius  Renal Navigator [phone]

## 2021-02-10 NOTE — Interval H&P Note (Signed)
History and Physical Interval Note:  02/10/2021 1:15 PM  Jeffrey Zuniga  has presented today for surgery, with the diagnosis of CKD 5.  The various methods of treatment have been discussed with the patient and family. After consideration of risks, benefits and other options for treatment, the patient has consented to  Procedure(s): LEFT UPPER EXTREMITY ARTERIOVENOUS (AV) FISTULA VERSUS ARTERIOVENOUS GRAFT CREATION (Left) as a surgical intervention.  The patient's history has been reviewed, patient examined, no change in status, stable for surgery.  I have reviewed the patient's chart and labs.  Questions were answered to the patient's satisfaction.     Servando Snare

## 2021-02-10 NOTE — Anesthesia Postprocedure Evaluation (Signed)
Anesthesia Post Note  Patient: Jeffrey Zuniga  Procedure(s) Performed: LEFT UPPER EXTREMITY ARTERIOVENOUS (AV) FISTULA  CREATION (Left)     Patient location during evaluation: PACU Anesthesia Type: General Level of consciousness: awake and alert Pain management: pain level controlled Vital Signs Assessment: post-procedure vital signs reviewed and stable Respiratory status: spontaneous breathing, nonlabored ventilation, respiratory function stable and patient connected to nasal cannula oxygen Cardiovascular status: blood pressure returned to baseline and stable Postop Assessment: no apparent nausea or vomiting Anesthetic complications: no   No notable events documented.  Last Vitals:  Vitals:   02/10/21 1520 02/10/21 1548  BP: (!) 181/81 (!) 192/83  Pulse: 66 63  Resp: 20 18  Temp: 36.8 C 36.8 C  SpO2: 100% 100%    Last Pain:  Vitals:   02/10/21 1520  TempSrc:   PainSc: 0-No pain                 Rayshaun Needle S

## 2021-02-10 NOTE — Op Note (Signed)
    Patient name: Jeffrey Zuniga MRN: [id number] DOB: [birth year] Sex: male  02/10/2021 Pre-operative Diagnosis: esrd Post-operative diagnosis:  Same Surgeon:  Erlene Quan C. Donzetta Matters, MD Assistant: Melene Muller, MD Procedure Performed:  Left arm brachial artery to cephalic vein AV fistula creation  Indications: 65 year old male now on dialysis via right IJ tunneled dialysis catheter.  He is right-hand dominant.  We are planning for left arm AV access.  We have discussed the risk benefits and alternatives and he demonstrates good understanding and is willing to proceed.  An assistant was necessary to facilitate exposure and expedite the case.  Findings: There is a large cephalic vein easily dilated to 4-1/2 mm at the antecubitum.  He appeared to have vein at the wrist this only measured 2 mm by duplex.  We proceeded with brachial artery cephalic vein AV fistula on completion there is a palpable radial artery pulse the wrist and a very strong thrill throughout the cephalic vein in the upper arm.   Procedure:  The patient was identified in the holding area and taken to the operating was put supine on upper table and LMA anesthesia was induced.  He was sterilely prepped and draped in the left upper extremity usual fashion, antibiotics were ministered a timeout was called the ultrasound was used to identify first the cephalic vein at the wrist this appeared to diminutive in the cephalic vein and basilic vein at the antecubitum.  Both of these appear to be suitable by duplex.  Transverse incision was made just below the antecubitum.  We first dissected out the cephalic vein.  There was a median cubital branch.  We marked the vein for orientation.  We dissected through the deep fascia of the brachial artery placed a vessel loop around this.  The vein was then marked for orientation and transected distally serially dilated to 4-1/2 mm flushed heparinized saline and clamped.  The arteries clamped distally proximally opened  longitudinally flushed heparinized saline in both directions.  Vein was spatulated sewn into side with 6-0 Prolene suture.  Prior completion without flushing in the usual fashion.  Upon completion there was 1 area we placed the repair stitch.  There was a good thrill throughout the upper arm and a palpable radial artery pulse the wrist both of these were confirmed with Doppler.  Satisfied with this we irrigated the wound and obtain hemostasis.  We closed in layers of Vicryl and Monocryl.  Dermabond was placed at the level of the skin.  He was then awakened from anesthesia having tolerated procedure without any complication.  Counts were correct at completion.  EBL: 50cc   Tayana Shankle C. Donzetta Matters, MD [facility] Office: [phone] Pager: [phone]

## 2021-02-11 ENCOUNTER — Encounter (HOSPITAL_COMMUNITY): Payer: Self-pay | Admitting: Vascular Surgery

## 2021-02-11 DIAGNOSIS — N179 Acute kidney failure, unspecified: Secondary | ICD-10-CM | POA: Diagnosis not present

## 2021-02-11 DIAGNOSIS — N185 Chronic kidney disease, stage 5: Secondary | ICD-10-CM | POA: Diagnosis not present

## 2021-02-11 LAB — RENAL FUNCTION PANEL
Albumin: 2.4 g/dL — ABNORMAL LOW (ref 3.5–5.0)
Anion gap: 8 (ref 5–15)
BUN: 55 mg/dL — ABNORMAL HIGH (ref 8–23)
CO2: 22 mmol/L (ref 22–32)
Calcium: 7.3 mg/dL — ABNORMAL LOW (ref 8.9–10.3)
Chloride: 105 mmol/L (ref 98–111)
Creatinine, Ser: 5.47 mg/dL — ABNORMAL HIGH (ref 0.61–1.24)
GFR, Estimated: 11 mL/min — ABNORMAL LOW (ref >60)
Glucose, Bld: 142 mg/dL — ABNORMAL HIGH (ref 70–99)
Phosphorus: 3.9 mg/dL (ref 2.5–4.6)
Potassium: 4.4 mmol/L (ref 3.5–5.1)
Sodium: 135 mmol/L (ref 135–145)

## 2021-02-11 MED ORDER — KIDNEY FAILURE BOOK: Freq: Once | Status: AC

## 2021-02-11 MED ORDER — SODIUM CHLORIDE 0.9 % IV SOLN
250.0000 mg | INTRAVENOUS | Status: DC
  Administered 2021-02-13 – 2021-02-15 (×2): 250 mg via INTRAVENOUS
  Filled 2021-02-11 (×3): qty 20

## 2021-02-11 MED ORDER — HEPARIN SODIUM (PORCINE) 1000 UNIT/ML IJ SOLN
INTRAMUSCULAR | Status: AC
  Filled 2021-02-11: qty 1

## 2021-02-11 NOTE — Plan of Care (Signed)
  Problem: Education: Goal: Knowledge of disease and its progression will improve Outcome: Progressing   Problem: Health Behavior/Discharge Planning: Goal: Ability to manage health-related needs will improve Outcome: Progressing   Problem: Nutritional: Goal: Ability to make healthy dietary choices will improve Outcome: Progressing

## 2021-02-11 NOTE — Progress Notes (Signed)
   VASCULAR SURGERY ASSESSMENT & PLAN:   POD 1 LEFT BRACHIOCEPHALIC AV FISTULA: His fistula has an excellent thrill.  He has a palpable left radial pulse.  His incision looks fine.  The fistula can be used in 12 weeks.  Vascular surgery will be available as needed.  We have already arranged follow-up.  SUBJECTIVE:   No complaints  PHYSICAL EXAM:   Vitals:   02/10/21 1520 02/10/21 1548 02/11/21 0500 02/11/21 0642  BP: (!) 181/81 (!) 192/83  (!) 157/77  Pulse: 66 63  68  Resp: '20 18  18  '$ Temp: 98.2 F (36.8 C) 98.2 F (36.8 C)  98.3 F (36.8 C)  TempSrc:    Oral  SpO2: 100% 100%  100%  Weight:   71.6 kg   Height:       His incision looks fine. He has a good thrill in his fistula. He has a palpable radial pulse.  LABS:   Lab Results  Component Value Date   WBC 6.1 02/10/2021   HGB 9.9 (L) 02/10/2021   HCT 29.0 (L) 02/10/2021   MCV 81.8 02/10/2021   PLT 167 02/10/2021   Lab Results  Component Value Date   CREATININE 5.47 (H) 02/11/2021   Lab Results  Component Value Date   INR 1.3 (H) 02/08/2021   CBG (last 3)  Recent Labs    02/10/21 1111 02/10/21 1144 02/10/21 1452  GLUCAP 102* 98 89    PROBLEM LIST:    Principal Problem:   Acute on chronic renal failure (HCC) Active Problems:   Symptomatic anemia   Accelerated hypertension   Acute kidney injury ([place])   Bilateral lower extremity edema   Acute on chronic combined systolic and diastolic congestive heart failure (HCC)   CURRENT MEDS:    sodium chloride   Intravenous Once   amLODipine  10 mg Oral Daily   carvedilol  12.5 mg Oral BID WC   Chlorhexidine Gluconate Cloth  6 each Topical Q0600   feeding supplement (NEPRO CARB STEADY)  237 mL Oral Q24H   isosorbide mononitrate  30 mg Oral Daily   multivitamin  1 tablet Oral QHS   pantoprazole  40 mg Oral Daily   sodium zirconium cyclosilicate  10 g Oral Daily    Deitra Mayo Office: [phone] 02/11/2021

## 2021-02-11 NOTE — Progress Notes (Signed)
Patient ID: Jeffrey Zuniga, male   DOB: July 31, 1955, 65 y.o.   MRN: [id number] S: was not able to be seen yesterday-was in OR. Seen and examined on HD today. S/p LUE AVF w/ Dr. Donzetta Matters yesterday. Tolerating HD today, no complaints (today is HD#2).  I was present at this dialysis session. I have reviewed the session itself and made appropriate changes.  O:BP (!) 147/65 (BP Location: Right Arm)   Pulse 66   Temp 97.9 F (36.6 C) (Oral)   Resp 19   Ht _0  (1.702 m)   Wt 74.1 kg   SpO2 99%   BMI 25.59 kg/m   Intake/Output Summary (Last 24 hours) at 02/11/2021 0918 Last data filed at 02/10/2021 2200 Gross per 24 hour  Intake 480 ml  Output 2975 ml  Net -2495 ml   Intake/Output: I/O last 3 completed shifts: In: 90 [P.O.:840; IV Piggyback:50] Out: 0086 [PYPPJ:0932; Other:2000]  Intake/Output this shift:  No intake/output data recorded. Weight change: -6 kg Gen:NAD CVS: RRR Resp: CTA Abd: +BS, soft, NT/ND Ext: no edema BL LE HD access: RIJ TDC c/d/I, +LUE AVF  Recent Labs  Lab 02/07/21 1250 02/08/21 0349 02/09/21 0423 02/10/21 0503 02/10/21 1145 02/11/21 0246  NA 137 138 137  137 135 140 135  K 5.3* 4.6 4.4  4.3 4.4 3.7 4.4  CL 113* 114* 113*  112* 108 104 105  CO2 16* 17* 16*  16* 19*  --  22  GLUCOSE 115* 93 95  97 103* 99 142*  BUN 68* 66* 71*  72* 74* 44* 55*  CREATININE 6.13* 5.98* 6.41*  6.55* 6.82* 4.70* 5.47*  ALBUMIN  --  2.6* 2.6* 2.7*  --  2.4*  CALCIUM 7.8* 7.8* 7.6*  7.5* 7.5*  --  7.3*  PHOS  --  5.2* 5.4* 5.0*  --  3.9   Liver Function Tests: Recent Labs  Lab 02/09/21 0423 02/10/21 0503 02/11/21 0246  ALBUMIN 2.6* 2.7* 2.4*   No results for input(s): LIPASE, AMYLASE in the last 168 hours. No results for input(s): AMMONIA in the last 168 hours. CBC: Recent Labs  Lab 02/07/21 1250 02/08/21 0349 02/08/21 1111 02/09/21 0423 02/10/21 0503 02/10/21 1145  WBC 5.0 4.7  --  5.0 6.1  --   HGB 5.5* 6.8*   < > 7.4* 8.1* 9.9*  HCT 18.9* 21.5*   < >  23.5* 25.7* 29.0*  MCV 89.2 81.1  --  82.7 81.8  --   PLT 156 145*  --  137* 167  --    < > = values in this interval not displayed.   Cardiac Enzymes: No results for input(s): CKTOTAL, CKMB, CKMBINDEX, TROPONINI in the last 168 hours. CBG: Recent Labs  Lab 02/10/21 1111 02/10/21 1144 02/10/21 1452  GLUCAP 102* 98 89    Iron Studies:  Recent Labs    02/08/21 1111  IRON 38*  TIBC 298  FERRITIN 23*   Studies/Results: IR US Guide Vasc Access Right  Result Date: 02/09/2021 INDICATION: 65 year old male with end-stage renal disease requiring central venous access for initiation of hemodialysis. EXAM: TUNNELED CENTRAL VENOUS HEMODIALYSIS CATHETER PLACEMENT WITH ULTRASOUND AND FLUOROSCOPIC GUIDANCE MEDICATIONS: Ancef 2 gm IV . The antibiotic was given in an appropriate time interval prior to skin puncture. ANESTHESIA/SEDATION: Moderate (conscious) sedation was employed during this procedure. A total of Versed 2 mg and Fentanyl 100 mcg was administered intravenously. Moderate Sedation Time: 10 minutes. The patient's level of consciousness and vital signs were monitored continuously by  radiology nursing throughout the procedure under my direct supervision. FLUOROSCOPY TIME:  0 minutes 12 seconds (2.2 mGy). COMPLICATIONS: None immediate. PROCEDURE: Informed written consent was obtained from the patient after a discussion of the risks, benefits, and alternatives to treatment. Questions regarding the procedure were encouraged and answered. The right neck and chest were prepped with chlorhexidine in a sterile fashion, and a sterile drape was applied covering the operative field. Maximum barrier sterile technique with sterile gowns and gloves were used for the procedure. A timeout was performed prior to the initiation of the procedure. After creating a small venotomy incision, a 21 gauge micropuncture kit was utilized to access the internal jugular vein. Real-time ultrasound guidance was utilized for  vascular access including the acquisition of a permanent ultrasound image documenting patency of the accessed vessel. A Rosen wire was advanced to the level of the IVC and the micropuncture sheath was exchanged for an 8 Fr dilator. A 14.5 French tunneled hemodialysis catheter measuring 23 cm from tip to cuff was tunneled in a retrograde fashion from the anterior chest wall to the venotomy incision. Serial dilation was then performed an a peel-away sheath was placed. The catheter was then placed through the peel-away sheath with the catheter tip ultimately positioned within the right atrium. Final catheter positioning was confirmed and documented with a spot radiographic image. The catheter aspirates and flushes normally. The catheter was flushed with appropriate volume heparin dwells. The catheter exit site was secured with a 0-Silk retention suture. The venotomy incision was closed with Dermabond. Sterile dressings were applied. The patient tolerated the procedure well without immediate post procedural complication. IMPRESSION: Successful placement of 23 cm tip to cuff tunneled hemodialysis catheter via the right internal jugular vein with catheter tip terminating within the right atrium. The catheter is ready for immediate use. Ruthann Cancer, MD [facility] Radiology Electronically Signed   By: Ruthann Cancer M.D.   On: 02/09/2021 10:30   VAS [place] UPPER EXT VEIN MAPPING (PRE-OP AVF)  Result Date: 02/10/2021 UPPER EXTREMITY VEIN MAPPING Patient Name:  Jeffrey Zuniga  Date of Exam:   02/09/2021 Medical Rec #: [id number]     Accession #:    [id number] Date of Birth: 03-28-1956     Patient Gender: M Patient Age:   65 years Exam Location:  [facility] Procedure:      VAS [place] UPPER EXT VEIN MAPPING (PRE-OP AVF) Referring Phys: Dagoberto Ligas --------------------------------------------------------------------------------  Indications: Pre-access. History: Poorly  controlled hypertension, Acute on chronic kidney failure.  Comparison Study: No prior study Performing Technologist: Sharion Dove RVS  Examination Guidelines: A complete evaluation includes B-mode imaging, spectral Doppler, color Doppler, and power Doppler as needed of all accessible portions of each vessel. Bilateral testing is considered an integral part of a complete examination. Limited examinations for reoccurring indications may be performed as noted. +-----------------+-------------+----------+---------+ Right Cephalic   Diameter (cm)Depth (cm)Findings  +-----------------+-------------+----------+---------+ Prox upper arm       0.23        0.20             +-----------------+-------------+----------+---------+ Mid upper arm        0.24        0.25   branching +-----------------+-------------+----------+---------+ Dist upper arm       0.25        0.35   branching +-----------------+-------------+----------+---------+ Antecubital fossa    0.32        0.20             +-----------------+-------------+----------+---------+  Prox forearm         0.34        0.21   branching +-----------------+-------------+----------+---------+ Mid forearm          0.35        0.35   branching +-----------------+-------------+----------+---------+ Dist forearm         0.26        0.32   branching +-----------------+-------------+----------+---------+ Wrist                0.15        0.13             +-----------------+-------------+----------+---------+ +-----------------+-------------+----------+---------+ Left Cephalic    Diameter (cm)Depth (cm)Findings  +-----------------+-------------+----------+---------+ Prox upper arm       0.20        0.27             +-----------------+-------------+----------+---------+ Mid upper arm        0.18        0.22             +-----------------+-------------+----------+---------+ Dist upper arm       0.30        0.22   branching  +-----------------+-------------+----------+---------+ Antecubital fossa    0.30        0.16             +-----------------+-------------+----------+---------+ Prox forearm         0.29        0.38   branching +-----------------+-------------+----------+---------+ Mid forearm          0.22        0.29   branching +-----------------+-------------+----------+---------+ Dist forearm         0.23        0.48   branching +-----------------+-------------+----------+---------+ Wrist                0.13        0.22             +-----------------+-------------+----------+---------+ +-----------------+-------------+----------+---------+ Left Basilic     Diameter (cm)Depth (cm)Findings  +-----------------+-------------+----------+---------+ Prox upper arm       0.35        0.45             +-----------------+-------------+----------+---------+ Mid upper arm        0.32        0.51             +-----------------+-------------+----------+---------+ Dist upper arm       0.34        0.52             +-----------------+-------------+----------+---------+ Antecubital fossa    0.37        0.47             +-----------------+-------------+----------+---------+ Prox forearm         0.23        0.16             +-----------------+-------------+----------+---------+ Mid forearm          0.19        0.17   branching +-----------------+-------------+----------+---------+ Distal forearm       0.10        0.23             +-----------------+-------------+----------+---------+ Wrist                0.16        0.10             +-----------------+-------------+----------+---------+ *See table(s) above  for measurements and observations.  Diagnosing physician: Jamelle Haring Electronically signed by Jamelle Haring on 02/10/2021 at 7:49:50 AM.    Final     sodium chloride   Intravenous Once   amLODipine  10 mg Oral Daily   carvedilol  12.5 mg Oral BID WC   Chlorhexidine  Gluconate Cloth  6 each Topical Q0600   feeding supplement (NEPRO CARB STEADY)  237 mL Oral Q24H   isosorbide mononitrate  30 mg Oral Daily   multivitamin  1 tablet Oral QHS   pantoprazole  40 mg Oral Daily   sodium zirconium cyclosilicate  10 g Oral Daily    BMET    Component Value Date/Time   NA 135 02/11/2021 0246   K 4.4 02/11/2021 0246   CL 105 02/11/2021 0246   CO2 22 02/11/2021 0246   GLUCOSE 142 (H) 02/11/2021 0246   BUN 55 (H) 02/11/2021 0246   CREATININE 5.47 (H) 02/11/2021 0246   CALCIUM 7.3 (L) 02/11/2021 0246   CALCIUM 7.3 (L) 10/29/2020 1528   GFRNONAA 11 (L) 02/11/2021 0246   CBC    Component Value Date/Time   WBC 6.1 02/10/2021 0503   RBC 3.14 (L) 02/10/2021 0503   HGB 9.9 (L) 02/10/2021 1145   HCT 29.0 (L) 02/10/2021 1145   PLT 167 02/10/2021 0503   MCV 81.8 02/10/2021 0503   MCH 25.8 (L) 02/10/2021 0503   MCHC 31.5 02/10/2021 0503   RDW 18.5 (H) 02/10/2021 0503   LYMPHSABS 0.6 (L) 10/29/2020 0248   MONOABS 0.6 10/29/2020 0248   EOSABS 0.2 10/29/2020 0248   BASOSABS 0.0 10/29/2020 0248    Assessment/Plan:  AKI/CKD stage V - presumably due to poorly controlled HTN.  Pt has had poor compliance with follow up and declined vascular access placement at his last admission in May. Started HD 9/2. S/p RIJ TDC by IR on 02/09/21, s/p LUE BC AVF 9/2 w/ VVS  Will also start educating pt on dialysis and will need outpatient dialysis arrangements made. #2 treatment today, plan #3 treatment on Monday. Hypertensive urgency-  resume meds and follow response. UF as tolerated Acute on chronic diastolic chf -off lasix now, volume status reasonable, UF as tolerated Symptomatic anemia - hgb down to 5.5, now improving.  Need to guaiac stools given his history of GIB in the past.  Transfuse per primary. Hgb stable, iron deficient, will start fe load Metabolic acidosis - due to CKD.  improved with HD Proteinuria - pt noted to have nephrotic range proteinuria in the past,  however renal US with echogenic kidneys and given advanced CKD did not feel biopsy would change his trajectory.  Hyperkalemia - improved with HD CKD-BMD- phos ok now, pth pending, not on binders Disposition - clip for outpatient hd placement  Gean Quint, MD [place]

## 2021-02-11 NOTE — Progress Notes (Addendum)
HD#3 SUBJECTIVE:  Patient Summary: Jeffrey Zuniga is a 65 y.o. with a pertinent PMH of CKD stage V now ESRD, uncontrolled hypertension, anemia of chronic disease, who presented with dyspnea on exertion and admitted for severe symptomatic hypertension and progression of CKD stave V to ESRD requiring HD.   Overnight Events: None  Interim History: Jeffrey Zuniga is feeling well today and has only a complaint of feeling tired. He reports a mild headache overnight but it has since resolved. He was evaluated at bedside during HD session #2.   OBJECTIVE:  Vital Signs: Vitals:   02/11/21 0900 02/11/21 0930 02/11/21 1000 02/11/21 1030  BP: (!) 143/71 (!) 141/70 (!) 144/67 (!) 146/65  Pulse: 65 67 66 66  Resp:      Temp:      TempSrc:      SpO2:      Weight:      Height:       Supplemental O2: Room Air SpO2: 99 % O2 Flow Rate (L/min): 2 L/min  Filed Weights   02/10/21 1024 02/11/21 0500 02/11/21 0745  Weight: 70.2 kg 71.6 kg 74.1 kg     Intake/Output Summary (Last 24 hours) at 02/11/2021 1137 Last data filed at 02/10/2021 2200 Gross per 24 hour  Intake 480 ml  Output 975 ml  Net -495 ml   Net IO Since Admission: -4,916.67 mL [02/11/21 1137]  Physical Exam: Constitutional: Laying comfortably in bed receiving hemodialysis. No acute distress. Cardio: Regular rate and rhythm, no murmurs/rubs/gallops Pulm: Clear to auscultation bilaterally, normal work of breathing Chest: Hemodialysis port with clean dressing over R upper chest Abdomen: Soft, non-distended, non-tender MSK: Closed surgical incision over L antecubital area. Bruit appreciated over AV fistula. Skin: Warm and dry. Venous stasis discoloration present over bilateral lower legs. Neuro: No focal deficit noted. Alert and oriented x 3.  Patient Lines/Drains/Airways Status     Active Line/Drains/Airways     Name Placement date Placement time Site Days   Peripheral IV 02/10/21 18 G Posterior;Proximal;Right Forearm 02/10/21  1140   Forearm  1   Fistula / Graft Left Upper arm Arteriovenous fistula 02/10/21  1438  Upper arm  1   Hemodialysis Catheter Right Internal jugular Double lumen Permanent (Tunneled) 02/09/21  0958  Internal jugular  2   Incision (Closed) 02/10/21 Arm Left 02/10/21  1440  -- 1             ASSESSMENT/PLAN:  Assessment: Principal Problem:   Acute on chronic renal failure (HCC) Active Problems:   Symptomatic anemia   Accelerated hypertension   Acute kidney injury ([place])   Bilateral lower extremity edema   Acute on chronic combined systolic and diastolic congestive heart failure ([place])   Plan: #Hypertensive emergency #Chronic uncontrolled hypertension Patient remains hypertensive after initiation of HD however is improved at 157/77. Undergoing HD #2 today with HD #3 set for Monday. - Continue amlodipine 10 mg - Continue carvedilol 25 mg BID - Continue Imdur 30 mg daily - Hydralazine 10 mg IV q4h PRN SBP >180  #AKI on CKD V, now ESRD Right sided tunneled dialysis catheter placed [id number] with no complications. L AV fistula created yesterday yesterday [id number] without complication.  - Nephrology and vascular on board, appreciate recommendations - CLIP process started  #Acute on chronic diastolic heart failure LE edema marginal. HD session #2 today. Patient is net -4.916L since admission, net -5.1 kg since admission.  #Hyperkalemia, resolved - Follow BMP  #Venous stasis ulcers vs. Ecchymosis ABI's 08/31 unremarkable.  Discoloration likely related to venous stasis. Unchanged.  Best Practice: Diet: Renal diet and fluid restriction of 1200 mL IVF: Fluids: 0.9NS, Rate:  10 mL/hr VTE: SCDs Start: 02/07/21 1650 Code: Full AB: None DISPO: Anticipated discharge pending clinical improvement.  Signature: Farrel Gordon, D.O.  Internal Medicine Resident, PGY-1 [facility]  Pager: [phone] 11:37 AM, 02/11/2021   Please contact the on call pager after 5 pm and on  weekends at [phone].

## 2021-02-12 DIAGNOSIS — N179 Acute kidney failure, unspecified: Secondary | ICD-10-CM | POA: Diagnosis not present

## 2021-02-12 DIAGNOSIS — N185 Chronic kidney disease, stage 5: Secondary | ICD-10-CM | POA: Diagnosis not present

## 2021-02-12 LAB — CBC WITH DIFFERENTIAL/PLATELET
Abs Immature Granulocytes: 0.03 10*3/uL (ref 0.00–0.07)
Basophils Absolute: 0 10*3/uL (ref 0.0–0.1)
Basophils Relative: 0 %
Eosinophils Absolute: 0.1 10*3/uL (ref 0.0–0.5)
Eosinophils Relative: 1 %
HCT: 24.4 % — ABNORMAL LOW (ref 39.0–52.0)
Hemoglobin: 7.9 g/dL — ABNORMAL LOW (ref 13.0–17.0)
Immature Granulocytes: 1 %
Lymphocytes Relative: 14 %
Lymphs Abs: 0.9 10*3/uL (ref 0.7–4.0)
MCH: 26.4 pg (ref 26.0–34.0)
MCHC: 32.4 g/dL (ref 30.0–36.0)
MCV: 81.6 fL (ref 80.0–100.0)
Monocytes Absolute: 0.8 10*3/uL (ref 0.1–1.0)
Monocytes Relative: 13 %
Neutro Abs: 4.6 10*3/uL (ref 1.7–7.7)
Neutrophils Relative %: 71 %
Platelets: 79 10*3/uL — ABNORMAL LOW (ref 150–400)
RBC: 2.99 MIL/uL — ABNORMAL LOW (ref 4.22–5.81)
RDW: 17.9 % — ABNORMAL HIGH (ref 11.5–15.5)
WBC: 6.5 10*3/uL (ref 4.0–10.5)
nRBC: 0 % (ref 0.0–0.2)

## 2021-02-12 LAB — RENAL FUNCTION PANEL
Albumin: 2.4 g/dL — ABNORMAL LOW (ref 3.5–5.0)
Anion gap: 6 (ref 5–15)
BUN: 39 mg/dL — ABNORMAL HIGH (ref 8–23)
CO2: 27 mmol/L (ref 22–32)
Calcium: 7.4 mg/dL — ABNORMAL LOW (ref 8.9–10.3)
Chloride: 101 mmol/L (ref 98–111)
Creatinine, Ser: 4.35 mg/dL — ABNORMAL HIGH (ref 0.61–1.24)
GFR, Estimated: 14 mL/min — ABNORMAL LOW (ref >60)
Glucose, Bld: 108 mg/dL — ABNORMAL HIGH (ref 70–99)
Phosphorus: 3.3 mg/dL (ref 2.5–4.6)
Potassium: 3.4 mmol/L — ABNORMAL LOW (ref 3.5–5.1)
Sodium: 134 mmol/L — ABNORMAL LOW (ref 135–145)

## 2021-02-12 NOTE — Progress Notes (Addendum)
Patient ID: Jeffrey Zuniga, male   DOB: Jan 17, 1956, 65 y.o.   MRN: [id number] S: no acute events, no complaints.   O:BP (!) 154/63 (BP Location: Right Arm)   Pulse 72   Temp 98.5 F (36.9 C) (Oral)   Resp 18   Ht '5\' 7"'$  (1.702 m)   Wt 73.9 kg   SpO2 100%   BMI 25.51 kg/m   Intake/Output Summary (Last 24 hours) at 02/12/2021 1143 Last data filed at 02/12/2021 1000 Gross per 24 hour  Intake 940 ml  Output 0 ml  Net 940 ml   Intake/Output: I/O last 3 completed shifts: In: 1300 [P.O.:1017; I.V.:283] Out: 2600 [Urine:600; Other:2000]  Intake/Output this shift:  Total I/O In: 120 [P.O.:120] Out: -  Weight change: 3.9 kg Gen:NAD CVS: RRR Resp: CTA Abd: +BS, soft, NT/ND Ext: no edema BL LE HD access: RIJ TDC c/d/I, +LUE AVF  Recent Labs  Lab 02/07/21 1250 02/08/21 0349 02/09/21 0423 02/10/21 0503 02/10/21 1145 02/11/21 0246 02/12/21 0045  NA 137 138 137  137 135 140 135 134*  K 5.3* 4.6 4.4  4.3 4.4 3.7 4.4 3.4*  CL 113* 114* 113*  112* 108 104 105 101  CO2 16* 17* 16*  16* 19*  --  22 27  GLUCOSE 115* 93 95  97 103* 99 142* 108*  BUN 68* 66* 71*  72* 74* 44* 55* 39*  CREATININE 6.13* 5.98* 6.41*  6.55* 6.82* 4.70* 5.47* 4.35*  ALBUMIN  --  2.6* 2.6* 2.7*  --  2.4* 2.4*  CALCIUM 7.8* 7.8* 7.6*  7.5* 7.5*  --  7.3* 7.4*  PHOS  --  5.2* 5.4* 5.0*  --  3.9 3.3   Liver Function Tests: Recent Labs  Lab 02/10/21 0503 02/11/21 0246 02/12/21 0045  ALBUMIN 2.7* 2.4* 2.4*   No results for input(s): LIPASE, AMYLASE in the last 168 hours. No results for input(s): AMMONIA in the last 168 hours. CBC: Recent Labs  Lab 02/07/21 1250 02/08/21 0349 02/08/21 1111 02/09/21 0423 02/10/21 0503 02/10/21 1145 02/12/21 0045  WBC 5.0 4.7  --  5.0 6.1  --  6.5  NEUTROABS  --   --   --   --   --   --  4.6  HGB 5.5* 6.8*   < > 7.4* 8.1* 9.9* 7.9*  HCT 18.9* 21.5*   < > 23.5* 25.7* 29.0* 24.4*  MCV 89.2 81.1  --  82.7 81.8  --  81.6  PLT 156 145*  --  137* 167  --  79*   <  > = values in this interval not displayed.   Cardiac Enzymes: No results for input(s): CKTOTAL, CKMB, CKMBINDEX, TROPONINI in the last 168 hours. CBG: Recent Labs  Lab 02/10/21 1111 02/10/21 1144 02/10/21 1452  GLUCAP 102* 98 89    Iron Studies:  No results for input(s): IRON, TIBC, TRANSFERRIN, FERRITIN in the last 72 hours.  Studies/Results: No results found.  sodium chloride   Intravenous Once   amLODipine  10 mg Oral Daily   carvedilol  12.5 mg Oral BID WC   Chlorhexidine Gluconate Cloth  6 each Topical Q0600   feeding supplement (NEPRO CARB STEADY)  237 mL Oral Q24H   isosorbide mononitrate  30 mg Oral Daily   multivitamin  1 tablet Oral QHS   pantoprazole  40 mg Oral Daily   sodium zirconium cyclosilicate  10 g Oral Daily    BMET    Component Value Date/Time   NA 134 (  L) 02/12/2021 0045   K 3.4 (L) 02/12/2021 0045   CL 101 02/12/2021 0045   CO2 27 02/12/2021 0045   GLUCOSE 108 (H) 02/12/2021 0045   BUN 39 (H) 02/12/2021 0045   CREATININE 4.35 (H) 02/12/2021 0045   CALCIUM 7.4 (L) 02/12/2021 0045   CALCIUM 7.3 (L) 10/29/2020 1528   GFRNONAA 14 (L) 02/12/2021 0045   CBC    Component Value Date/Time   WBC 6.5 02/12/2021 0045   RBC 2.99 (L) 02/12/2021 0045   HGB 7.9 (L) 02/12/2021 0045   HCT 24.4 (L) 02/12/2021 0045   PLT 79 (L) 02/12/2021 0045   MCV 81.6 02/12/2021 0045   MCH 26.4 02/12/2021 0045   MCHC 32.4 02/12/2021 0045   RDW 17.9 (H) 02/12/2021 0045   LYMPHSABS 0.9 02/12/2021 0045   MONOABS 0.8 02/12/2021 0045   EOSABS 0.1 02/12/2021 0045   BASOSABS 0.0 02/12/2021 0045    Assessment/Plan:  AKI/CKD stage V, now ESRD - presumably due to poorly controlled HTN.  Pt has had poor compliance with follow up and declined vascular access placement at his last admission in May. Started HD 9/2. S/p RIJ TDC by IR on 02/09/21, s/p LUE BC AVF 9/2 w/ VVS  will need outpatient dialysis arrangements made. #3 treatment tomorrow Hypertensive urgency-  resume meds  and follow response. UF as tolerated Acute on chronic diastolic chf -off lasix now, volume status reasonable, UF as tolerated Symptomatic anemia - hgb down to 5.5, now improving.  Need to guaiac stools given his history of GIB in the past.  Transfuse per primary. Hgb stable, iron deficient, started fe load Metabolic acidosis - due to CKD.  improved with HD Proteinuria - pt noted to have nephrotic range proteinuria in the past, however renal US with echogenic kidneys and given advanced CKD did not feel biopsy would change his trajectory.  Hyperkalemia - improved with HD CKD-BMD- phos ok now, pth pending, not on binders Disposition - clip for outpatient hd placement  Gean Quint, MD [place]

## 2021-02-12 NOTE — Progress Notes (Addendum)
HD#4 SUBJECTIVE:  Patient Summary: Jeffrey Zuniga is a 65 y.o. with a pertinent PMH of CKD stage V now ESRD, uncontrolled hypertension, anemia of chronic disease, who presented with dyspnea on exertion and admitted for severe symptomatic hypertension and progression of CKD stave V to ESRD requiring HD.   Overnight Events: None.  Interim History: Mr. Jeffrey Zuniga states that he is still tired feeling today but overall well. He endorses mild pain where the incision was made to create L AV fistula. He denies any headache, chest pain.   OBJECTIVE:  Vital Signs: Vitals:   02/11/21 1815 02/11/21 2017 02/12/21 0408 02/12/21 0826  BP: (!) 150/61 (!) 161/75 (!) 158/71 (!) 154/63  Pulse: 72 76 65 72  Resp: '18 20 18 18  '$ Temp: 98.6 F (37 C) 98.6 F (37 C) 98.6 F (37 C) 98.5 F (36.9 C)  TempSrc: Oral Oral Oral Oral  SpO2: 96% 99% 100% 100%  Weight:  73.9 kg    Height:       Supplemental O2: Room Air SpO2: 100 % O2 Flow Rate (L/min): 2 L/min  Filed Weights   02/11/21 0745 02/11/21 1100 02/11/21 2017  Weight: 74.1 kg 71.8 kg 73.9 kg     Intake/Output Summary (Last 24 hours) at 02/12/2021 1002 Last data filed at 02/12/2021 0600 Gross per 24 hour  Intake 820 ml  Output 2000 ml  Net -1180 ml   Net IO Since Admission: -6,096.67 mL [02/12/21 1002]  Physical Exam: Constitutional: Patient resting comfortably in bed. No acute distress. Cardio: Regular rate and rhythm. No acute distress. Pulm: Clear to auscultation bilaterally. Normal effort of breathing. Chest: Hemodialysis port with clean dressing over R chest. Abdomen: Soft, non-distended, non-tender. MSK: Closed surgical incision over L antecubital area with bruit appreciated. Skin: Skin is thickened over bilateral LE with venous stasis discoloration of lower legs. Neuro: No focal deficit. Alert and oriented x 3. Psych: Appropriate mood and affect.   Patient Lines/Drains/Airways Status     Active Line/Drains/Airways     Name  Placement date Placement time Site Days   Peripheral IV 02/10/21 18 G Posterior;Proximal;Right Forearm 02/10/21  1140  Forearm  2   Fistula / Graft Left Upper arm Arteriovenous fistula 02/10/21  1438  Upper arm  2   Hemodialysis Catheter Right Internal jugular Double lumen Permanent (Tunneled) 02/09/21  0958  Internal jugular  3   Incision (Closed) 02/10/21 Arm Left 02/10/21  1440  -- 2             ASSESSMENT/PLAN:  Assessment: Principal Problem:   Acute on chronic renal failure (HCC) Active Problems:   Symptomatic anemia   Accelerated hypertension   Acute kidney injury ([place])   Bilateral lower extremity edema   Acute on chronic combined systolic and diastolic congestive heart failure ([place])   Plan: #Hypertensive emergency #Chronic uncontrolled hypertension Patient is hypertensive at 158/71 which is unchanged from yesterday. He is s/p HD #2 with HD #3 set for tomorrow 02/13/2021. He is net -6.1L.  - Continue amlodipine 10 mg - Continue carvedilol 25 mg BID - Continue Imdur 30 mg daily - Hydralazine 10 mg IV q4h PRN SBP>180  #AKI on CKD V, now ESRD R sided tunneled dialysis catheter placed [id number] without complications. Patient endorses mild pain at site of L AV fistula creation but is tolerable. - Nephrology and vascular on board, appreciate their recommendations - CLIP process started  #Hyperkalemia, resolved - Follow BMP  Best Practice: Diet: Renal diet and fluid restriction of  1200 mL IVF: Fluids: 0.9NS, Rate:  10 cc/hr VTE: SCDs Start: 02/07/21 1650 Code: Full AB: None DISPO: Anticipated discharge pending clinical improvement.  Signature: Farrel Gordon, D.O.  Internal Medicine Resident, PGY-1 [facility]  Pager: [phone] 10:02 AM, 02/12/2021   Please contact the on call pager after 5 pm and on weekends at [phone].

## 2021-02-12 NOTE — Plan of Care (Signed)
  Problem: Education: Goal: Knowledge of General Education information will improve Description: Including pain rating scale, medication(s)/side effects and non-pharmacologic comfort measures Outcome: Completed/Met   Problem: Clinical Measurements: Goal: Diagnostic test results will improve Outcome: Completed/Met Goal: Respiratory complications will improve Outcome: Completed/Met Goal: Cardiovascular complication will be avoided Outcome: Completed/Met   

## 2021-02-13 DIAGNOSIS — Z992 Dependence on renal dialysis: Secondary | ICD-10-CM

## 2021-02-13 DIAGNOSIS — N189 Chronic kidney disease, unspecified: Secondary | ICD-10-CM | POA: Diagnosis not present

## 2021-02-13 DIAGNOSIS — N179 Acute kidney failure, unspecified: Secondary | ICD-10-CM | POA: Diagnosis not present

## 2021-02-13 DIAGNOSIS — I10 Essential (primary) hypertension: Secondary | ICD-10-CM | POA: Diagnosis not present

## 2021-02-13 LAB — CBC WITH DIFFERENTIAL/PLATELET
Abs Immature Granulocytes: 0.02 10*3/uL (ref 0.00–0.07)
Basophils Absolute: 0 10*3/uL (ref 0.0–0.1)
Basophils Relative: 0 %
Eosinophils Absolute: 0.1 10*3/uL (ref 0.0–0.5)
Eosinophils Relative: 1 %
HCT: 26.9 % — ABNORMAL LOW (ref 39.0–52.0)
Hemoglobin: 8.4 g/dL — ABNORMAL LOW (ref 13.0–17.0)
Immature Granulocytes: 0 %
Lymphocytes Relative: 8 %
Lymphs Abs: 0.6 10*3/uL — ABNORMAL LOW (ref 0.7–4.0)
MCH: 25.8 pg — ABNORMAL LOW (ref 26.0–34.0)
MCHC: 31.2 g/dL (ref 30.0–36.0)
MCV: 82.5 fL (ref 80.0–100.0)
Monocytes Absolute: 1.1 10*3/uL — ABNORMAL HIGH (ref 0.1–1.0)
Monocytes Relative: 14 %
Neutro Abs: 5.7 10*3/uL (ref 1.7–7.7)
Neutrophils Relative %: 77 %
Platelets: 80 10*3/uL — ABNORMAL LOW (ref 150–400)
RBC: 3.26 MIL/uL — ABNORMAL LOW (ref 4.22–5.81)
RDW: 17.2 % — ABNORMAL HIGH (ref 11.5–15.5)
WBC: 7.4 10*3/uL (ref 4.0–10.5)
nRBC: 0 % (ref 0.0–0.2)

## 2021-02-13 LAB — RENAL FUNCTION PANEL
Albumin: 2.3 g/dL — ABNORMAL LOW (ref 3.5–5.0)
Anion gap: 6 (ref 5–15)
BUN: 51 mg/dL — ABNORMAL HIGH (ref 8–23)
CO2: 27 mmol/L (ref 22–32)
Calcium: 7.5 mg/dL — ABNORMAL LOW (ref 8.9–10.3)
Chloride: 103 mmol/L (ref 98–111)
Creatinine, Ser: 5.11 mg/dL — ABNORMAL HIGH (ref 0.61–1.24)
GFR, Estimated: 12 mL/min — ABNORMAL LOW (ref >60)
Glucose, Bld: 83 mg/dL (ref 70–99)
Phosphorus: 4.2 mg/dL (ref 2.5–4.6)
Potassium: 3.4 mmol/L — ABNORMAL LOW (ref 3.5–5.1)
Sodium: 136 mmol/L (ref 135–145)

## 2021-02-13 MED ORDER — ISOSORBIDE MONONITRATE ER 60 MG PO TB24
60.0000 mg | ORAL_TABLET | Freq: Every day | ORAL | Status: DC
  Administered 2021-02-13 – 2021-02-15 (×3): 60 mg via ORAL
  Filled 2021-02-13 (×3): qty 1

## 2021-02-13 MED ORDER — POLYETHYLENE GLYCOL 3350 17 G PO PACK: 17.0000 g | PACK | Freq: Once | ORAL | Status: DC

## 2021-02-13 NOTE — Progress Notes (Signed)
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  Status post a right IJ TDC by IR on 9/1, s/p LUE BC AVF 9/2 w/ VVS.  Started dialysis on 9/2 and has been tolerating well.  Receiving third treatment today.  He will need outpatient HD arrangement on discharge.  #Hypertensive urgency: Continue home medication and UF as tolerated.  # Anemia: Drop in hemoglobin, need to rule out bleeding.  Receiving IV iron.  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  Monitor calcium level.  #Acute on chronic diastolic CHF: Initially tried diuretics which is off now.  UF as tolerated.  Looks compensated today.  Subjective: Seen and examined.  He is receiving dialysis  and tolerating well.  Denies nausea, vomiting, chest pain, shortness of breath. Objective Vital signs in last 24 hours: Vitals:   02/13/21 0859 02/13/21 0930 02/13/21 1000 02/13/21 1030  BP: (!) (P) 172/79 (!) (P) 174/81 (!) (P) 174/82 (!) (P) 174/80  Pulse: (P) 66 (P) 66 (P) 64 (P) 64  Resp: (P) 16 (P) 16 (P) 15 (P) 15  Temp:      TempSrc:      SpO2:      Weight:      Height:       Weight change: 0.4 kg  Intake/Output Summary (Last 24 hours) at 02/13/2021 1204 Last data filed at 02/13/2021 0828 Gross per 24 hour  Intake 774 ml  Output 0 ml  Net 774 ml       Labs: Basic Metabolic Panel: Recent Labs  Lab 02/11/21 0246 02/12/21 0045 02/13/21 0224  NA 135 134* 136  K 4.4 3.4* 3.4*  CL 105 101 103  CO2 '22 27 27  '$ GLUCOSE 142* 108* 83  BUN 55* 39* 51*  CREATININE 5.47* 4.35* 5.11*  CALCIUM 7.3* 7.4* 7.5*  PHOS 3.9 3.3 4.2   Liver Function Tests: Recent Labs  Lab 02/11/21 0246 02/12/21 0045 02/13/21 0224  ALBUMIN 2.4* 2.4* 2.3*   No results for input(s): LIPASE, AMYLASE in the last 168 hours. No results for input(s): AMMONIA in the last 168 hours. CBC: Recent Labs  Lab 02/07/21 1250  02/08/21 0349 02/08/21 1111 02/09/21 0423 02/10/21 0503 02/10/21 1145 02/12/21 0045  WBC 5.0 4.7  --  5.0 6.1  --  6.5  NEUTROABS  --   --   --   --   --   --  4.6  HGB 5.5* 6.8*   < > 7.4* 8.1* 9.9* 7.9*  HCT 18.9* 21.5*   < > 23.5* 25.7* 29.0* 24.4*  MCV 89.2 81.1  --  82.7 81.8  --  81.6  PLT 156 145*  --  137* 167  --  79*   < > = values in this interval not displayed.   Cardiac Enzymes: No results for input(s): CKTOTAL, CKMB, CKMBINDEX, TROPONINI in the last 168 hours. CBG: Recent Labs  Lab 02/10/21 1111 02/10/21 1144 02/10/21 1452  GLUCAP 102* 98 89    Iron Studies: No results for input(s): IRON, TIBC, TRANSFERRIN, FERRITIN in the last 72 hours. Studies/Results: No results found.  Medications: Infusions:  sodium chloride 10 mL/hr at 02/10/21 1346   ferric gluconate (FERRLECIT) IVPB 250 mg (02/13/21 1147)    Scheduled Medications:  amLODipine  10 mg Oral Daily   carvedilol  12.5 mg Oral BID WC   Chlorhexidine Gluconate Cloth  6 each Topical Q0600   feeding supplement (NEPRO  CARB STEADY)  237 mL Oral Q24H   isosorbide mononitrate  60 mg Oral Daily   multivitamin  1 tablet Oral QHS   pantoprazole  40 mg Oral Daily    have reviewed scheduled and prn medications.  Physical Exam: General:NAD, comfortable Heart:RRR, s1s2 nl Lungs:clear b/l, no crackle Abdomen:soft, Non-tender, non-distended Extremities:No edema Dialysis Access: RIJ TDC c/d/I, +LUE AVF   Ardice Boyan Tanna Furry 02/13/2021,12:04 PM  LOS: 5 days

## 2021-02-13 NOTE — Plan of Care (Signed)
  Problem: Health Behavior/Discharge Planning: Goal: Ability to manage health-related needs will improve Outcome: Progressing   Problem: Clinical Measurements: Goal: Ability to maintain clinical measurements within normal limits will improve Outcome: Progressing   Problem: Nutrition: Goal: Adequate nutrition will be maintained Outcome: Progressing   Problem: Coping: Goal: Level of anxiety will decrease Outcome: Progressing   Problem: Clinical Measurements: Goal: Complications related to the disease process or treatment will be avoided or minimized Outcome: Progressing

## 2021-02-13 NOTE — Progress Notes (Addendum)
HD#5 SUBJECTIVE:  Patient Summary: Jeffrey Zuniga is a 65 y.o. with a pertinent PMH of CKD stage V now ESRD, uncontrolled hypertension, anemia of chronic disease, who presented with dyspnea on exertion and admitted for severe symptomatic hypertension and progression of CKD stage V to ESRD requiring HD.   Overnight Events: None  Interim History: Jeffrey Zuniga seen and evaluated at bedside while undergoing HD #3. He reports no pain at this time. He is concerned about being able to submit rent payment while in the hospital and is requesting assistance taking care of that.  OBJECTIVE:  Vital Signs: Vitals:   02/12/21 0826 02/12/21 1835 02/12/21 2028 02/13/21 0459  BP: (!) 154/63 (!) 135/57 (!) 143/61 (!) 168/76  Pulse: 72 68 73 73  Resp: '18 16 18 20  '$ Temp: 98.5 F (36.9 C) 98 F (36.7 C) 98.2 F (36.8 C) 98.5 F (36.9 C)  TempSrc: Oral Oral Oral Oral  SpO2: 100% 99% 99% 98%  Weight:   74.5 kg   Height:       Supplemental O2: Room Air SpO2: 98 % O2 Flow Rate (L/min): 2 L/min  Filed Weights   02/11/21 1100 02/11/21 2017 02/12/21 2028  Weight: 71.8 kg 73.9 kg 74.5 kg     Intake/Output Summary (Last 24 hours) at 02/13/2021 0941 Last data filed at 02/13/2021 [id number] Gross per 24 hour  Intake 894 ml  Output 0 ml  Net 894 ml   Net IO Since Admission: -4,982.67 mL [02/13/21 0941]  Physical Exam: Constitutional: Patient resting comfortably in HD bed. No acute distress. Cardio: Regular rate and rhythm. No murmurs/rubs/gallops. Pulm: Clear to auscultation bilaterally. Normal effort of breathing. Chest: Hemodialysis port with clean dressing over R chest. Abdomen: Soft, non-tender, non-distended. MSK: Closed surgical incision over L antecubital area with bruit appreciated over fistula.  Skin: Thickened skin over bilateral LE with venous stasis discoloration of lower legs. Neuro: No focal deficit. Alert and oriented x 3. Psych: Appropriate mood and affect.   Patient Lines/Drains/Airways  Status     Active Line/Drains/Airways     Name Placement date Placement time Site Days   Peripheral IV 02/10/21 18 G Posterior;Proximal;Right Forearm 02/10/21  1140  Forearm  3   Fistula / Graft Left Upper arm Arteriovenous fistula 02/10/21  1438  Upper arm  3   Hemodialysis Catheter Right Internal jugular Double lumen Permanent (Tunneled) 02/09/21  0958  Internal jugular  4   Incision (Closed) 02/10/21 Arm Left 02/10/21  1440  -- 3             ASSESSMENT/PLAN:  Assessment: Principal Problem:   Acute on chronic renal failure (HCC) Active Problems:   Symptomatic anemia   Accelerated hypertension   Acute kidney injury ([place])   Bilateral lower extremity edema   Acute on chronic combined systolic and diastolic congestive heart failure ([place])   Plan:   #AKI on CKD V, now ESRD on HD Patient undergoing HD#3 at this time. Patient reports no pain around tunneled dialysis catheter site with mild pain around site of L AV fistula. - Nephrology and vascular on board, appreciate their recommendations - CLIP process started  #Hypertensive emergency, resolved #Chronic uncontrolled hypertension Patient remains hypertensive at 168/76. He is currently undergoing HD#3. He is net -4.982L since admission. Increasing imdur today. Consider transitioning to BiDil if he remains above goal.  - Continue amlodipine 10 mg - Continue carvedilol 12.5 mg BID - Increase Imdur to 60 mg daily - Hydralazine 10 mg IV q4h PRN  SBP>180  #Hyperkalemia, resolved - Hold lokelma - Trend BMP  Best Practice: Diet: Renal diet and fluid restriction of 1200 mL IVF: Fluids: 0.9NS, Rate:  10 mL/hr VTE: SCDs Start: 02/07/21 1650 Code: Full AB: None DISPO: Anticipated discharge pending  clinical improvement and outpatient dialysis arrangements .  Signature: Farrel Gordon, D.O.  Internal Medicine Resident, PGY-1 [facility]  Pager: [phone] 9:41 AM, 02/13/2021   Please contact the  on call pager after 5 pm and on weekends at [phone].

## 2021-02-13 NOTE — TOC Initial Note (Signed)
Transition of Care [facility]) - Initial/Assessment Note    Patient Details  Name: Jeffrey Zuniga MRN: [id number] Date of Birth: 1955-10-12  Transition of [facility]) CM/SW Contact:    Tom-Johnson, Renea Ee, RN Phone Number: 02/13/2021, 1:50 PM  Clinical Narrative:                 CM consulted for Financial help to pay bills. Spoke with patient at bedside. States he lives alone at home and daughter checks up on him frequently. Currently working as a Retail buyer at [employer] but will not be going back due to his medical condition. Usually drives self but will be needing [place] Popper ride as he does not feel safe to drive after this hospitalization. Does not have a PCP at this time. Does not have DME at home because he did not need it. States he needs help with getting his rent paid. CM asked him if he needs financial assist and patient states he has the money but does not know how to get it from the bank. States he was hoping his daughter will come visit him and he will have her get the money. CM explained to him that banks are closed today for the holiday and will resume tomorrow. CM asked patient if his [place] knows he is in the hospital and he stated yes. CM told patient since the banks are closed today, a follow up will be done tomorrow to see how his rent gets paid. Secure chat sent to Financial counselors, awaiting response. TOC will continue to follow.  Expected Discharge Plan: Home/Self Care Barriers to Discharge: Continued Medical Work up   Patient Goals and CMS Choice Patient states their goals for this hospitalization and ongoing recovery are:: To o home CMS Medicare.gov Compare Post Acute Care list provided to:: Patient Choice offered to / list presented to : Patient  Expected Discharge Plan and Services Expected Discharge Plan: Home/Self Care   Discharge Planning Services: CM Consult   Living arrangements for the past 2 months: Apartment                                       Prior Living Arrangements/Services Living arrangements for the past 2 months: Apartment Lives with:: Self Patient language and need for interpreter reviewed:: Yes Do you feel safe going back to the place where you live?: Yes      Need for Family Participation in Patient Care: Yes (Comment) Care giver support system in place?: Yes (comment)   Criminal Activity/Legal Involvement Pertinent to Current Situation/Hospitalization: No - Comment as needed  Activities of Daily Living Home Assistive Devices/Equipment: None ADL Screening (condition at time of admission) Patient's cognitive ability adequate to safely complete daily activities?: Yes Is the patient deaf or have difficulty hearing?: No Does the patient have difficulty seeing, even when wearing glasses/contacts?: No Does the patient have difficulty concentrating, remembering, or making decisions?: No Patient able to express need for assistance with ADLs?: Yes Does the patient have difficulty dressing or bathing?: No Independently performs ADLs?: Yes (appropriate for developmental age) Does the patient have difficulty walking or climbing stairs?: Yes Weakness of Legs: Both Weakness of Arms/Hands: None  Permission Sought/Granted Permission sought to share information with : Case Manager Permission granted to share information with : Yes, Verbal Permission Granted              Emotional Assessment Appearance:: Appears stated age  Attitude/Demeanor/Rapport: Engaged Affect (typically observed): Accepting, Appropriate, Hopeful Orientation: : Oriented to Self, Oriented to Place, Oriented to  Time, Oriented to Situation Alcohol / Substance Use: Alcohol Use, Tobacco Use (Stopped drinking a month ago, last cigarette was the day of admission.) Psych Involvement: No (comment)  Admission diagnosis:  Testicular swelling [N50.89] Testicular pain [N50.819] Accelerated hypertension [I10] Scrotal edema [N50.89] Anemia associated with  chronic renal failure [N18.9, D63.1] Acute on chronic renal failure (HCC) [N17.9, N18.9] AKI (acute kidney injury) ([place]) [N17.9] Acute on chronic heart failure (HCC) [I50.9] Patient Active Problem List   Diagnosis Date Noted   Acute on chronic renal failure ([place]) 02/07/2021   Acute on chronic heart failure ([place]) 0000000   Helicobacter pylori gastritis 10/28/2020   Severe asymptomatic hypertension 08/29/2020   Acute on chronic combined systolic and diastolic congestive heart failure (HCC)    LVH (left ventricular hypertrophy) 04/04/2020   Iron deficiency anemia 04/02/2020   Acute heart failure with reduced ejection fraction and diastolic dysfunction ([place]) 04/02/2020   Symptomatic anemia 04/01/2020   Essential hypertension 04/01/2020   Acute kidney injury ([place]) 04/01/2020   Bilateral lower extremity edema 04/01/2020   PCP:  Patient, No Pcp Per (Inactive) Pharmacy:   [facility], [address] [place] [address] Phone: [phone] Fax: [phone]  [address] [place] [id number] [place] [place] [address] Phone: [phone] Fax: [phone]     Social Determinants of Health (SDOH) Interventions    Readmission Risk Interventions No flowsheet data found.

## 2021-02-13 NOTE — Progress Notes (Signed)
PT Cancellation Note  Patient Details Name: Boykin Cosper MRN: [id number] DOB: [birth year]   Cancelled Treatment:    Reason Eval/Treat Not Completed: Patient at procedure or test/unavailable  Currently in HD;  Will follow up later today as time allows;  Otherwise, will follow up for PT tomorrow;   Thank you,  Roney Marion, PT  Acute Rehabilitation Services Pager [phone] Office [place] 02/13/2021, 11:54 AM

## 2021-02-13 NOTE — Evaluation (Signed)
Physical Therapy Evaluation Patient Details Name: Jeffrey Zuniga MRN: [id number] DOB: Sep 03, 1955 Today's Date: 02/13/2021   History of Present Illness  Eastan Kaluza is a 65 y.o. with a pertinent PMH of CKD stage V now ESRD, uncontrolled hypertension, anemia of chronic disease, who presented with dyspnea on exertion and admitted for severe symptomatic hypertension and progression of CKD stave V to ESRD requiring HD.  Clinical Impression   Pt admitted with above diagnosis. Coems from home where he lives alone in an apartment with a flight of steps to enter; Independent and working at baseline; Presents to PT with decr activity tolerance, and mild gait unsteadiness; Pt is interested in trying a cane, which could be helpful if he feels unsteady after HD;  Pt currently with functional limitations due to the deficits listed below (see PT Problem List). Pt will benefit from skilled PT to increase their independence and safety with mobility to allow discharge to the venue listed below.    He is concerned about his vision -- wonders if DM or HTN has effected his eyes; Can that be addressed here, or would an eye examination be done as outpt?     Follow Up Recommendations No PT follow up    Equipment Recommendations  Cane    Recommendations for Other Services       Precautions / Restrictions Precautions Precautions: None      Mobility  Bed Mobility Overal bed mobility: Independent                  Transfers Overall transfer level: Independent                  Ambulation/Gait Ambulation/Gait assistance: Supervision Gait Distance (Feet): 250 Feet Assistive device: None (occasional use of hallway rail) Gait Pattern/deviations: Step-through pattern     General Gait Details: Overall steady; Cues to self-monitor for activity tolerance; occasionally reaching out for hallway rail  Stairs            Wheelchair Mobility    Modified Rankin (Stroke Patients Only)        Balance Overall balance assessment: No apparent balance deficits (not formally assessed)                                           Pertinent Vitals/Pain Pain Assessment: No/denies pain    Home Living Family/patient expects to be discharged to:: Private residence Living Arrangements: Alone Available Help at Discharge: Family;Available PRN/intermittently (Grown daughters live within an hour or 2 drive) Type of Home: Apartment Home Access: Stairs to enter Entrance Stairs-Rails: Psychiatric nurse of Steps: flight Home Layout: One level Home Equipment: None      Prior Function Level of Independence: Independent         Comments: Works in custodial supervision at [employer]; is aware that HD takes a lot of time, and is planning to retire     Journalist, newspaper        Extremity/Trunk Assessment   Upper Extremity Assessment Upper Extremity Assessment: Overall WFL for tasks assessed    Lower Extremity Assessment Lower Extremity Assessment: Generalized weakness (Subjective report of feeling weaker)    Cervical / Trunk Assessment Cervical / Trunk Assessment: Normal  Communication   Communication: No difficulties  Cognition Arousal/Alertness: Awake/alert Behavior During Therapy: WFL for tasks assessed/performed Overall Cognitive Status: Within Functional Limits for tasks assessed  General Comments: tends to talk fast and has high energy      General Comments General comments (skin integrity, edema, etc.): Discussed current stessors, and lifestyle changes to shift into regular HD    Exercises     Assessment/Plan    PT Assessment Patient needs continued PT services  PT Problem List Decreased strength;Decreased activity tolerance;Decreased knowledge of use of DME       PT Treatment Interventions DME instruction;Stair training;Gait training;Functional mobility training;Therapeutic  activities;Therapeutic exercise;Patient/family education    PT Goals (Current goals can be found in the Care Plan section)  Acute Rehab PT Goals Patient Stated Goal: To focus on what he needs to do for his kidneys PT Goal Formulation: With patient Time For Goal Achievement: 02/20/21 Potential to Achieve Goals: Good    Frequency Min 3X/week   Barriers to discharge        Co-evaluation               AM-PAC PT "6 Clicks" Mobility  Outcome Measure Help needed turning from your back to your side while in a flat bed without using bedrails?: None Help needed moving from lying on your back to sitting on the side of a flat bed without using bedrails?: None Help needed moving to and from a bed to a chair (including a wheelchair)?: None Help needed standing up from a chair using your arms (e.g., wheelchair or bedside chair)?: None Help needed to walk in hospital room?: None Help needed climbing 3-5 steps with a railing? : None 6 Click Score: 24    End of Session   Activity Tolerance: Patient tolerated treatment well Patient left: in chair;with call bell/phone within reach Nurse Communication: Mobility status PT Visit Diagnosis: Other abnormalities of gait and mobility (R26.89)    Time: [id number] PT Time Calculation (min) (ACUTE ONLY): 33 min   Charges:   PT Evaluation $PT Eval Low Complexity: 1 Low PT Treatments $Gait Training: 8-22 mins        Roney Marion, PT  Acute Rehabilitation Services Pager [phone] Office [phone]   [place] 02/13/2021, 3:20 PM

## 2021-02-14 DIAGNOSIS — N189 Chronic kidney disease, unspecified: Secondary | ICD-10-CM | POA: Diagnosis not present

## 2021-02-14 DIAGNOSIS — H5461 Unqualified visual loss, right eye, normal vision left eye: Secondary | ICD-10-CM

## 2021-02-14 DIAGNOSIS — D696 Thrombocytopenia, unspecified: Secondary | ICD-10-CM | POA: Diagnosis not present

## 2021-02-14 DIAGNOSIS — N179 Acute kidney failure, unspecified: Secondary | ICD-10-CM | POA: Diagnosis not present

## 2021-02-14 LAB — RENAL FUNCTION PANEL
Albumin: 2.3 g/dL — ABNORMAL LOW (ref 3.5–5.0)
Anion gap: 8 (ref 5–15)
BUN: 33 mg/dL — ABNORMAL HIGH (ref 8–23)
CO2: 26 mmol/L (ref 22–32)
Calcium: 7.6 mg/dL — ABNORMAL LOW (ref 8.9–10.3)
Chloride: 99 mmol/L (ref 98–111)
Creatinine, Ser: 4.13 mg/dL — ABNORMAL HIGH (ref 0.61–1.24)
GFR, Estimated: 15 mL/min — ABNORMAL LOW (ref >60)
Glucose, Bld: 99 mg/dL (ref 70–99)
Phosphorus: 3.5 mg/dL (ref 2.5–4.6)
Potassium: 3.4 mmol/L — ABNORMAL LOW (ref 3.5–5.1)
Sodium: 133 mmol/L — ABNORMAL LOW (ref 135–145)

## 2021-02-14 LAB — CBC
HCT: 24.8 % — ABNORMAL LOW (ref 39.0–52.0)
Hemoglobin: 8.1 g/dL — ABNORMAL LOW (ref 13.0–17.0)
MCH: 26.7 pg (ref 26.0–34.0)
MCHC: 32.7 g/dL (ref 30.0–36.0)
MCV: 81.8 fL (ref 80.0–100.0)
Platelets: 66 10*3/uL — ABNORMAL LOW (ref 150–400)
RBC: 3.03 MIL/uL — ABNORMAL LOW (ref 4.22–5.81)
RDW: 17.2 % — ABNORMAL HIGH (ref 11.5–15.5)
WBC: 6 10*3/uL (ref 4.0–10.5)
nRBC: 0 % (ref 0.0–0.2)

## 2021-02-14 MED ORDER — SODIUM CHLORIDE 0.9% FLUSH: 3.0000 mL | INTRAVENOUS | Status: DC | PRN

## 2021-02-14 MED ORDER — DARBEPOETIN ALFA 40 MCG/0.4ML IJ SOSY
40.0000 ug | PREFILLED_SYRINGE | INTRAMUSCULAR | Status: DC
  Administered 2021-02-15: 40 ug via INTRAVENOUS
  Filled 2021-02-14 (×2): qty 0.4

## 2021-02-14 MED ORDER — CHLORHEXIDINE GLUCONATE CLOTH 2 % EX PADS
6.0000 | MEDICATED_PAD | Freq: Every day | CUTANEOUS | Status: DC
  Administered 2021-02-14 – 2021-02-15 (×2): 6 via TOPICAL

## 2021-02-14 MED ORDER — SODIUM CHLORIDE 0.9 % IV SOLN: 250.0000 mL | INTRAVENOUS | Status: DC | PRN

## 2021-02-14 MED ORDER — SODIUM CHLORIDE 0.9% FLUSH
3.0000 mL | Freq: Two times a day (BID) | INTRAVENOUS | Status: DC
  Administered 2021-02-14 – 2021-02-15 (×2): 3 mL via INTRAVENOUS

## 2021-02-14 NOTE — Progress Notes (Addendum)
HD#6 SUBJECTIVE:  Patient Summary: Jeffrey Zuniga is a 65 y.o. with a pertinent PMH of CKD stage V now ESRD, uncontrolled hypertension, anemia of chronic disease, who presented with dyspnea on exertion and admitted for severe symptomatic hypertension and progression of CKD stage V to ESRD requiring HD.   Overnight Events: None  Interim History: Patient feels well overall and just reports some fatigue. He has a new concern of increasingly blurred vision of the R eye over the last 2 weeks. He wears glasses but states that they are minimally helpful and reports some flashes or floaters in his vision. There is no pain to the eye. He states that this has happened before when his blood pressure was extremely high however it eventually went away at that time. 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  OBJECTIVE:  Vital Signs: Vitals:   02/13/21 2323 02/14/21 0455 02/14/21 0525 02/14/21 0829  BP: (!) 145/65  (!) 144/53 (!) 152/70  Pulse: 67  67 66  Resp: '18  18 17  '$ Temp: 98.3 F (36.8 C)  98.2 F (36.8 C) (!) 97.3 F (36.3 C)  TempSrc: Oral  Oral Oral  SpO2:   98% 100%  Weight:  72.2 kg    Height:       Supplemental O2: Room Air SpO2: 100 % O2 Flow Rate (L/min): 2 L/min  Filed Weights   02/13/21 0853 02/13/21 2034 02/14/21 0455  Weight: 73.2 kg 72.2 kg 72.2 kg     Intake/Output Summary (Last 24 hours) at 02/14/2021 1139 Last data filed at 02/14/2021 0600 Gross per 24 hour  Intake 677 ml  Output 2000 ml  Net -1323 ml   Net IO Since Admission: -6,305.67 mL [02/14/21 1139]  Physical Exam: Constitutional: Patient resting comfortably in bed in no acute distress. Eyes: Snellen test performed showed R eye 20/200, L eye not able to complete but does have ability to detect light. No conjunctival injection. Cardio: Regular rate and rhythm. No murmurs/rubs/gallops. Pulm: Clear to auscultation bilaterally, normal effort of breathing. Chest:  Hemodialysis port with clean dressing over R chest. Abdomen: Soft, non-tender, non-distended. MSK: Closed surgical incision over L antecubital area with bruit over fistula. Skin: Thickened skin over bilateral LE with venous stasis discoloration of bilateral lower legs. Neuro: Alert and oriented x 3.  Psych: Appropriate mood and affect.    Patient Lines/Drains/Airways Status     Active Line/Drains/Airways     Name Placement date Placement time Site Days   Peripheral IV 02/10/21 18 G Posterior;Proximal;Right Forearm 02/10/21  1140  Forearm  4   Fistula / Graft Left Upper arm Arteriovenous fistula 02/10/21  1438  Upper arm  4   Hemodialysis Catheter Right Internal jugular Double lumen Permanent (Tunneled) 02/09/21  0958  Internal jugular  5   Incision (Closed) 02/10/21 Arm Left 02/10/21  1440  -- 4             ASSESSMENT/PLAN:  Assessment: Principal Problem:   Acute on chronic renal failure (HCC) Active Problems:   Symptomatic anemia   Essential hypertension   Acute kidney injury ([place])   Bilateral lower extremity edema   Acute on chronic combined systolic and diastolic congestive heart failure ([place])   Plan: #AKI on CKD V, now ESRD on HD Patient s/p HD#3.  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 4 T's score is 5-intermediate risk- due to platelet count decreased by 30-50% (1 point), clear onset (2 points), no apparent other causes (2 points).  - Nephrology made aware of HIT concern - Pharmacy consult placed for heparin alternative, appreciate their recommendations - no need for current anticoagulation  - HIT ab ordered - SCDs ordered  Subacute Vision  Loss, OD Reported hx of Glaucoma, OS Patient reports a history of glaucoma in his left eye for which he is prescribed some eye drops (no records in our system). He reports new vision loss in his right eye that has been worsening over the past week and a half. On exam his pupils are equal and reactive bilaterally. He reports some pain in his left eye but there is no conjunctival injection. Visual acuity is light perception only on the left and 20/200 on the right. He is concerned about transportation and driving to and from dialysis.  -- Optho consult, appreciate assistance   #Hypertensive emergency, resolved #Chronic uncontrolled hypertension Better BP control today, 144/63. He is s/p HD#3 with HD#4 scheduled for tomorrow 09/07. Net -6.3L since admission.  - Continue amlodipine 10 mg - Continue carvedilol 12.5 mg BID - Continue Imdur 60 mg daily - Hydralazine 10 mg IV q4h PRN SBP>180 - F/u ophthalmology regarding acute vision changes to L eye  #Hyperkalemia, resolved - Trend BMP  Best Practice: Diet: Renal diet with fluid restriction of 1200 mL IVF: Fluids: 0.9NS, Rate:  42m/hr VTE: Place and maintain sequential compression device Start: 02/14/21 1050 SCDs Start: 02/07/21 1650 Code: Full AB: None Therapy Recs: None DISPO: Anticipated discharge pending arrangement for outpatient HD.  Signature: EFarrel Gordon D.O.  Internal Medicine Resident, PGY-1 [facility]  Pager: #[phone] AM, 02/14/2021   Please contact the on call pager after 5 pm and on weekends at [phone]

## 2021-02-14 NOTE — Progress Notes (Addendum)
Met with pt at bedside. Discussed pt preference for out-pt HD clinic. Pt advised that due pt's insurance, [facility] in the [place] area is out-of-network with pt's insurance. Discussed that [facility] in [place] is the closest clinic to pt's address that is in-network with pt's insurance. Pt agreeable to proceed with referral to [facility]. Spoke to [place] and provided additional needed info and faxed additional clinicals as well. Will await MD acceptance and chair time assignment.   Melven Sartorius  Renal Navigator [phone]  Addendum: Pt has been accepted to [facility] [id number] [facility] [id number]) with Dr Dimas Aguas (office: [phone]) to accept pt. Pt's schedule is MWF with pt to arrive at 10:40 with chair time of 11:00am. Pt will need to arrive at 10:30 on first treatment day. Pt cannot start until 9/9. Met with pt at bedside and provided a handout with above information documented. Pt voices understanding. CM made aware of HD schedule for transportation needs.

## 2021-02-14 NOTE — TOC Progression Note (Signed)
Transition of Care [facility]) - Progression Note    Patient Details  Name: [name] MRN: [id number] Date of Birth: [birth year]  Transition of Care [facility]) CM/SW Contact  Tom-Johnson, Renea Ee, RN Phone Number: 02/14/2021, 5:18 PM  Clinical Narrative:    CM consulted for assistance with transportation to and from dialysis center. Patient states with his vision impairment, he is unable to drive for safety and needs transportation. Application for [employer] [place] filled out and faxed to [facility], awaiting response. Will assist patient tomorrow to make calls to pay his rent. DME cane ordered and CM spoke with Freda Munro with [employer] [phone]) and will deliver cane to patient at bedside. TOC will continue to follow with needs.    Expected Discharge Plan: Home/Self Care Barriers to Discharge: Continued Medical Work up  Expected Discharge Plan and Services Expected Discharge Plan: Home/Self Care   Discharge Planning Services: CM Consult   Living arrangements for the past 2 months: Apartment                                       Social Determinants of Health (SDOH) Interventions    Readmission Risk Interventions No flowsheet data found.

## 2021-02-14 NOTE — Progress Notes (Signed)
[facility] NEPHROLOGY PROGRESS NOTE  Assessment/ Plan:  #Acute kidney injury on CKD 5 now progressed to ESRD presumably due to poorly controlled hypertension.  Status post a right IJ TDC by IR on 9/1, s/p LUE BC AVF 9/2 w/ VVS.  Started dialysis on 9/2.  Arrangement for outpatient dialysis ongoing, discussed with renal navigator. Status post third HD yesterday, tolerated well.  Plan for next dialysis tomorrow.  #Hypertensive urgency: Continue home medication and UF as tolerated.  Blood pressure is better.  # Anemia:  Receiving IV iron.  Start ESA.  Hemoglobin is stable.  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  Monitor calcium level.  #Acute on chronic diastolic CHF: Initially tried diuretics which is off now.  UF as tolerated.  Looks compensated today.  #Mild hypokalemia: Managed with dialysis.  Subjective: Seen and examined.  No new event.  Denies nausea, vomiting, chest pain or shortness of breath. Objective Vital signs in last 24 hours: Vitals:   02/13/21 2323 02/14/21 0455 02/14/21 0525 02/14/21 0829  BP: (!) 145/65  (!) 144/53 (!) 152/70  Pulse: 67  67 66  Resp: '18  18 17  '$ Temp: 98.3 F (36.8 C)  98.2 F (36.8 C) (!) 97.3 F (36.3 C)  TempSrc: Oral  Oral Oral  SpO2:   98% 100%  Weight:  72.2 kg    Height:       Weight change: -1.3 kg  Intake/Output Summary (Last 24 hours) at 02/14/2021 0945 Last data filed at 02/14/2021 0600 Gross per 24 hour  Intake 677 ml  Output 2000 ml  Net -1323 ml        Labs: Basic Metabolic Panel: Recent Labs  Lab 02/12/21 0045 02/13/21 0224 02/14/21 0202  NA 134* 136 133*  K 3.4* 3.4* 3.4*  CL 101 103 99  CO2 '27 27 26  '$ GLUCOSE 108* 83 99  BUN 39* 51* 33*  CREATININE 4.35* 5.11* 4.13*  CALCIUM 7.4* 7.5* 7.6*  PHOS 3.3 4.2 3.5    Liver Function Tests: Recent Labs  Lab 02/12/21 0045 02/13/21 0224 02/14/21 0202  ALBUMIN 2.4* 2.3* 2.3*    No results for input(s): LIPASE, AMYLASE in the last 168 hours. No  results for input(s): AMMONIA in the last 168 hours. CBC: Recent Labs  Lab 02/08/21 0349 02/08/21 1111 02/09/21 0423 02/10/21 0503 02/10/21 1145 02/12/21 0045 02/13/21 1138  WBC 4.7  --  5.0 6.1  --  6.5 7.4  NEUTROABS  --   --   --   --   --  4.6 5.7  HGB 6.8*   < > 7.4* 8.1* 9.9* 7.9* 8.4*  HCT 21.5*   < > 23.5* 25.7* 29.0* 24.4* 26.9*  MCV 81.1  --  82.7 81.8  --  81.6 82.5  PLT 145*  --  137* 167  --  79* 80*   < > = values in this interval not displayed.    Cardiac Enzymes: No results for input(s): CKTOTAL, CKMB, CKMBINDEX, TROPONINI in the last 168 hours. CBG: Recent Labs  Lab 02/10/21 1111 02/10/21 1144 02/10/21 1452  GLUCAP 102* 98 89     Iron Studies: No results for input(s): IRON, TIBC, TRANSFERRIN, FERRITIN in the last 72 hours. Studies/Results: No results found.  Medications: Infusions:  sodium chloride Stopped (02/13/21 1900)   ferric gluconate (FERRLECIT) IVPB Stopped (02/13/21 1947)    Scheduled Medications:  amLODipine  10 mg Oral Daily   carvedilol  12.5 mg Oral BID WC   Chlorhexidine Gluconate Cloth  6  each Topical Q0600   feeding supplement (NEPRO CARB STEADY)  237 mL Oral Q24H   isosorbide mononitrate  60 mg Oral Daily   multivitamin  1 tablet Oral QHS   pantoprazole  40 mg Oral Daily    have reviewed scheduled and prn medications.  Physical Exam: General:NAD, comfortable, able to lie flat Heart:RRR, s1s2 nl Lungs: Clear b/l, no crackle Abdomen:soft, Non-tender, non-distended Extremities:No edema Dialysis Access: RIJ TDC c/d/I, +LUE AVF   Michaelia Beilfuss Prasad Suhaila Troiano 02/14/2021,9:45 AM  LOS: 6 days

## 2021-02-14 NOTE — Progress Notes (Signed)
Pharmacy Heparin Induced Thrombocytopenia (HIT) Note:  Jeffrey Zuniga is an 65 y.o. male being evaluated for HIT. Patient received a single dose of heparin on 9/3 with his dialysis session.  Baseline platelets were 167.   HIT labs were ordered on 9/6 when platelets dropped to 66.  Auto-populate labs: No results found for: HEPINDPLTAB, SRALOWDOSEHP, SRAHIGHDOSEH   CALCULATE SCORE:  4Ts (see the HIT Algorithm) Score  Thrombocytopenia 2  Timing 0  Thrombosis 0  Other causes of thrombocytopenia 0  Total 2     Recommendations (A or B) are based on available lab results (HIT antibody and/or SRA) and the HIT algorithm    A. HIT antibody result available  HIT antibody in process   B. SRA result availability  SRA not ordered yet   Name of MD Contacted: Rick Duff, MD updated on process and plans for update.  Plan (Discussed with provider) Labs ordered:  HIT Ab pending.  Result will take 24-48 hours.  Based on those results, Rx will order SRA if indicted.  Heparin allergy:  Will add "possible Heparin allergy" to patient profile Anticoagulation plans:  No anticoagulation planned at this time.  Plan SCDs for VTE prophylaxis.  Can utilize citrate with HD if needed.   Comments (List any alternative plans or if there are contraindications to therapy)  Horton Chin, Pharm.D., BCPS Clinical Pharmacist Clinical phone for 02/14/2021 from 8:30-4:00 is [phone].  **Pharmacist phone directory can be found on [email or web address] listed under [place].  02/14/2021 1:18 PM

## 2021-02-15 DIAGNOSIS — D631 Anemia in chronic kidney disease: Secondary | ICD-10-CM

## 2021-02-15 DIAGNOSIS — N189 Chronic kidney disease, unspecified: Secondary | ICD-10-CM

## 2021-02-15 DIAGNOSIS — D696 Thrombocytopenia, unspecified: Secondary | ICD-10-CM | POA: Diagnosis not present

## 2021-02-15 DIAGNOSIS — H5461 Unqualified visual loss, right eye, normal vision left eye: Secondary | ICD-10-CM | POA: Diagnosis not present

## 2021-02-15 DIAGNOSIS — N179 Acute kidney failure, unspecified: Secondary | ICD-10-CM | POA: Diagnosis not present

## 2021-02-15 LAB — HEPATITIS PANEL, ACUTE
HCV Ab: REACTIVE — AB
Hep A IgM: NONREACTIVE
Hep B C IgM: NONREACTIVE
Hepatitis B Surface Ag: NONREACTIVE

## 2021-02-15 LAB — CBC
HCT: 24.8 % — ABNORMAL LOW (ref 39.0–52.0)
Hemoglobin: 7.9 g/dL — ABNORMAL LOW (ref 13.0–17.0)
MCH: 25.8 pg — ABNORMAL LOW (ref 26.0–34.0)
MCHC: 31.9 g/dL (ref 30.0–36.0)
MCV: 81 fL (ref 80.0–100.0)
Platelets: 83 10*3/uL — ABNORMAL LOW (ref 150–400)
RBC: 3.06 MIL/uL — ABNORMAL LOW (ref 4.22–5.81)
RDW: 17.1 % — ABNORMAL HIGH (ref 11.5–15.5)
WBC: 5.1 10*3/uL (ref 4.0–10.5)
nRBC: 0 % (ref 0.0–0.2)

## 2021-02-15 LAB — RENAL FUNCTION PANEL
Albumin: 2.4 g/dL — ABNORMAL LOW (ref 3.5–5.0)
Anion gap: 11 (ref 5–15)
BUN: 45 mg/dL — ABNORMAL HIGH (ref 8–23)
CO2: 26 mmol/L (ref 22–32)
Calcium: 8 mg/dL — ABNORMAL LOW (ref 8.9–10.3)
Chloride: 97 mmol/L — ABNORMAL LOW (ref 98–111)
Creatinine, Ser: 5.08 mg/dL — ABNORMAL HIGH (ref 0.61–1.24)
GFR, Estimated: 12 mL/min — ABNORMAL LOW (ref >60)
Glucose, Bld: 84 mg/dL (ref 70–99)
Phosphorus: 4.3 mg/dL (ref 2.5–4.6)
Potassium: 3.5 mmol/L (ref 3.5–5.1)
Sodium: 134 mmol/L — ABNORMAL LOW (ref 135–145)

## 2021-02-15 LAB — HEPARIN INDUCED PLATELET AB (HIT ANTIBODY): Heparin Induced Plt Ab: 0.104 OD (ref 0.000–0.400)

## 2021-02-15 LAB — PTH, INTACT AND CALCIUM
Calcium, Total (PTH): 7.3 mg/dL — ABNORMAL LOW (ref 8.6–10.2)
PTH: 240 pg/mL — ABNORMAL HIGH (ref 15–65)

## 2021-02-15 MED ORDER — HEPARIN SODIUM (PORCINE) 1000 UNIT/ML DIALYSIS: 1000.0000 [IU] | INTRAMUSCULAR | Status: DC | PRN

## 2021-02-15 MED ORDER — LIDOCAINE-PRILOCAINE 2.5-2.5 % EX CREA: 1.0000 "application " | TOPICAL_CREAM | CUTANEOUS | Status: DC | PRN

## 2021-02-15 MED ORDER — HEPARIN SODIUM (PORCINE) 1000 UNIT/ML DIALYSIS: 20.0000 [IU]/kg | INTRAMUSCULAR | Status: DC | PRN

## 2021-02-15 MED ORDER — HEPARIN SODIUM (PORCINE) 1000 UNIT/ML IJ SOLN
INTRAMUSCULAR | Status: AC
  Filled 2021-02-15: qty 4

## 2021-02-15 MED ORDER — ALTEPLASE 2 MG IJ SOLR: 2.0000 mg | Freq: Once | INTRAMUSCULAR | Status: DC | PRN

## 2021-02-15 MED ORDER — SODIUM CHLORIDE 0.9 % IV SOLN: 100.0000 mL | INTRAVENOUS | Status: DC | PRN

## 2021-02-15 MED ORDER — PENTAFLUOROPROP-TETRAFLUOROETH EX AERO: 1.0000 "application " | INHALATION_SPRAY | CUTANEOUS | Status: DC | PRN

## 2021-02-15 MED ORDER — HEPATITIS B VAC RECOMBINANT 10 MCG/0.5ML IJ SUSP
2.0000 mL | Freq: Once | INTRAMUSCULAR | Status: AC
  Administered 2021-02-15: 2 mL via INTRAMUSCULAR
  Filled 2021-02-15: qty 2

## 2021-02-15 MED ORDER — ISOSORBIDE MONONITRATE ER 60 MG PO TB24: 60.0000 mg | ORAL_TABLET | Freq: Every day | ORAL | 2 refills | Status: DC

## 2021-02-15 MED ORDER — LIDOCAINE HCL (PF) 1 % IJ SOLN: 5.0000 mL | INTRAMUSCULAR | Status: DC | PRN

## 2021-02-15 NOTE — TOC Transition Note (Signed)
Transition of Care [facility]) - CM/SW Discharge Note   Patient Details  Name: Jeffrey Zuniga MRN: [id number] Date of Birth: 26-Mar-1956  Transition of Care [facility]) CM/SW Contact:  Tom-Johnson, Renea Ee, RN Phone Number: 02/15/2021, 4:12 PM   Clinical Narrative:    CM got a returned e-mail message from [place] with [employer] about patient's application for transportation. Stated that patient is pre-certified for transportation and now able to make reservations. CM called to schedule transportation appointment and spoke with [facility] [phone]) and she stated that the dialysis center [place] of [place] and therefore will not be able to schedule transport. The dialysis center is in [facility] and patient lives in [place]. CM called [employer], patient's insurance carrier and spoke with Sheryle Spray [phone]) and she states that they do not offer transportation services. CM called and spoke with patient's daughter, Roanin Konopa [phone]) and explained to her about the transportation difficulty. Daughter lives in [place] and prefers patient  transferred to [facility] center. Olivia Mackie, Dialysis Coordinator notified and she spoke with the dialysis facility in [facility] to see if patient can be transferred to [facility]. Patient will have to start with the [facility] center until he is able to be transferred. Daughter agreed to transport until other arrangements are made.  CM received application from Santa Lighter, Hotel manager for [employer] via e-mail for patient to sign. CM printed out application and patient read and signed. CM sent signed application to Santa Lighter via e-mail. CM requested she added transportation need and hopefully that will take care of his transportation difficulties.  Daughter states to call her when patient is ready for discharge so she will call Melburn Popper ride. Nurse notified.  CM scheduled transportation for patient's  ophthalmologist appointment with Dr. Manuella Ghazi.  No further TOC needs at this time.  Final next level of care: Home/Self Care Barriers to Discharge: No Barriers Identified   Patient Goals and CMS Choice Patient states their goals for this hospitalization and ongoing recovery are:: To go home CMS Medicare.gov Compare Post Acute Care list provided to:: Patient Choice offered to / list presented to : Patient  Discharge Placement                       Discharge Plan and Services   Discharge Planning Services: CM Consult                                 Social Determinants of Health (SDOH) Interventions     Readmission Risk Interventions No flowsheet data found.

## 2021-02-15 NOTE — Progress Notes (Addendum)
[facility] with [facility] confirmed this am that insurance auth for out-pt HD has been received. Beverly aware of pt's d/c today to start at [facility] on Friday. D/C summary and last HD orders faxed to [facility] per her request.   Melven Sartorius  Renal Navigator  [phone]  Addendum: CM unable to locate transport to HD. Pt's dtr agreeable to transport pt to [facility] [place] on Friday and thereafter per CM. Dtr did inquire if [facility] would be an option. Spoke to [place] who states that since pt stable for d/c today, if kidney ctr SW is unable to arrange transport for pt, then pt can be transferred to [facility] as soon as able which is close to dtr's home. CM states dtr agreeable to plan.

## 2021-02-15 NOTE — Plan of Care (Signed)
  Problem: Clinical Measurements: Goal: Ability to maintain clinical measurements within normal limits will improve Outcome: Completed/Met   Problem: Nutrition: Goal: Adequate nutrition will be maintained Outcome: Completed/Met   Problem: Coping: Goal: Level of anxiety will decrease Outcome: Completed/Met   Problem: Clinical Measurements: Goal: Complications related to the disease process or treatment will be avoided or minimized Outcome: Completed/Met

## 2021-02-15 NOTE — Discharge Summary (Addendum)
Name: Jeffrey Zuniga MRN: [id number] DOB: 29-Nov-1955 65 y.o. PCP: Patient, No Pcp Per (Inactive)  Date of Admission: 02/07/2021 12:25 PM Date of Discharge:  02/15/2021 Attending Physician: Dr. Philipp Ovens  DISCHARGE DIAGNOSIS:  Primary Problem: Acute on chronic renal failure [facility])   Hospital Problems: Principal Problem:   Acute on chronic renal failure (HCC) Active Problems:   Symptomatic anemia   Essential hypertension   Acute kidney injury ([place])   Bilateral lower extremity edema   Acute on chronic combined systolic and diastolic congestive heart failure (HCC)   Thrombocytopenia (HCC)   Vision loss of right eye   Anemia associated with chronic renal failure    DISCHARGE MEDICATIONS:   Allergies as of 02/15/2021       Reactions   Heparin Other (See Comments)   Possible HIT - HIT Ab ordered 9/6 - results pending.        Medication List     STOP taking these medications    furosemide 40 MG tablet Commonly known as: LASIX   Talicia [id number] MG Cpdr Generic drug: Amoxicill-Rifabutin-Omeprazole       TAKE these medications    amLODipine 10 MG tablet Commonly known as: NORVASC Take 1 tablet (10 mg total) by mouth daily.   carvedilol 12.5 MG tablet Commonly known as: COREG Take 1 tablet (12.5 mg total) by mouth 2 (two) times daily with a meal.   ferrous sulfate 325 (65 FE) MG tablet Take 1 tablet (325 mg total) by mouth daily with breakfast.   isosorbide mononitrate 60 MG 24 hr tablet Commonly known as: IMDUR Take 1 tablet (60 mg total) by mouth daily. What changed:  medication strength how much to take   pantoprazole 40 MG tablet Commonly known as: PROTONIX Take 1 tablet (40 mg total) by mouth daily.               Durable Medical Equipment  (From admission, onward)           Start     Ordered   02/14/21 1345  For home use only DME Cane  Once        02/14/21 1346              Discharge Care Instructions  (From admission,  onward)           Start     Ordered   02/15/21 0000  Leave dressing on - Keep it clean, dry, and intact until clinic visit        02/15/21 1058            DISPOSITION AND FOLLOW-UP:  [name] was discharged from [facility] in Stable condition. At the hospital follow up visit please address:  #AKI on CKD stage V, now ESRD requiring HD Continue HD as outpatient on MWF. Monitor BMP for electrolyte abnormalities.  #Anemia of chronic disease likely secondary to chronic renal failure Patient presnted with Hgb 5.5 in the ED and was transfused with 2 units of PRBCs. Hgb remained stable throughout admission, with Hgb on day of discharge of 8.1. Follow-up CBC.  #Moderate Thrombocytopenia HIT ab pending. Avoid heparin products at this time. Pharmacy was consulted for heparin alternative and recommended no need for current anticoagulation. Follow-up CBC.  #Subacute Vision Loss, OD #Reported hx of Glaucoma, OS Dr. Manuella Ghazi with ophthalmology has agreed to see patient on walk-in basis on Thursday 09/08 or Friday 09/09 between 0730-1530.  #Hepatitis C antibody positive Follow-up with infectious disease. HCV genotype and quant  ordered.  Follow-up Recommendations: Consults: Cardiology, ophthalmology, nephrology, infectious disease Labs:  HIT ab, Basic Metabolic Profile, CBC, and Hemoccult Studies: None Medications: Increase Imdur to 60 mg daily. Discontinue Lasix.  Follow-up Appointments: Follow-up with your PCP in the next week. Follow-up with infectious disease for hepatitis C antibody positivity. Follow-up with ophthalmology. Follow-up with nephrology.  HOSPITAL COURSE:  Patient Summary: #AKI on CKD stage V, now ESRD requiring HD CKD likely secondary to poorly controlled HTN. Cr elevated to 6.1 this admission, up from his baseline of ~5. Patient had R sided tunneled dialysis catheter placed on 09/01 and L AV fistula creation on 09/02. HD sessions on 09/02, #2  09/03, #4 09/05, #4 09/07.    #Hypertensive emergency, resolved Patient presented with worsening dyspnea on exertion, blurry vision, and feeling like he is going to pass out. SBP in the 200s-220s in the ED. He has poor adherence to reported regimen of amlodipine, lasix, and carvedilol. CT head negative for any intracranial abnormality. Restarted on home amlodipine 10, carvedilol 12.5, and started on Imdur '30mg'$  daily. Imdur was increased to 60 mg daily on 09/05 due to continued hypertension. He has maintained SBP in 150 through admission.   #Acute on chronic diastolic heart failure Patient presented with 3 days of worsening scrotal edema and lower extremity edema. Patient is on lasix at home, but admits to poor adherence. Swelling worsened on the day of admission and the patient was having significant pain associated with his scrotal edema. BNP elevated at >2800 (was reported to be >4500 on last admission in May). CXR in the ED showed cardiomegaly with some pulmonary vascular congestion. Echo from 10/2020 showed EF of 65-70% and normal LV function; severe LVH noted and grade II diastolic dysfunction noted.Started on diuresis with '80mg'$  IV lasix q6h per nephro on 8/30 although urine output was not recorded. This was increased to 120 mg q6h 08/31. Echo on 8/31 showed EF of 55-60%, severe LVH, and grade II diastolic dysfunction. HD initiated 09/02 at which point lasix was discontinued. Patient continued to be normovolemic throughout stay with HD. Patient is net -6L.    #Moderate Thrombocytopenia Patient had down-trending platelets (down 47% since admission) over course of admission and there was a concern for HIT given new dialysis dependent ESRD (heparin products with HD). 4 T's score is 5-intermediate risk- due to platelet count decreased by 30-50% (1 point), clear onset (2 points), no apparent other causes (2 points). Pharmacy was consulted for heparin alternative and recommended no need for current  anticoagulation. HIT ab pending.     #Subacute Vision Loss, OD #Reported hx of Glaucoma, OS Patient reported a history of glaucoma in his left eye for which he is prescribed some eye drops (no records in our system). He reports new vision loss in his right eye that has been worsening over the past week and a half. On exam his pupils are equal and reactive bilaterally. He reports some pain in his left eye but there is no conjunctival injection. Visual acuity is light perception only on the left and 20/200 on the right without glasses. This improved to 20/50 on the R with glasses.   #Symptomatic anemia #Anemia of chronic disease likely secondary to chronic renal failure Patient presnted with Hgb 5.5 in the ED and was transfused with 2 units of PRBCs. Had normal EGD in 08/2020 and colonoscopy at the time showed 80m polyp that was removed in transverse colon. He received darbepoetin alfa 40 mcg IV and ferric gluconate 250 mg  IV on dialysis days during admission. Hgb remained stable throughout admission, with Hgb on day of discharge of 8.1.   #Hyperkalemia K 5.3 on admission, likely in the setting of CKD. Received 1 dose of Lokelma in the ED and K improved to 4.6 on 8/31. Remained normokalemic through admission not requiring Lokelma.   #Scrotal edema Scrotal edema likely secondary to acute heart failure exacerbation, as he has presented with this on his previous admissions. Patient presented with the complaint of severe scrotal pain. Denied any urinary complains- no dysuria, hematuria, frequency, or urgency. Had ultrasound of scrotum in May that showed no evidence of epididymo-orchitis or any testicular mass lesion. A small to moderate hydrocele was noted bilaterally and a small epididymal cyst/spermatocele. US scrotum with doppler on 8/30 with no findings of testicular torsion/mass, but noted bilateral hydroceles slightly greater on the right. Diffuse bilateral microlithiasis also noted to be similar to  previous ultrasound.    #Venous stasis ulcers vs ecchymosis Bilateral lower extremities were cool to the touch with faint distal pulses. Has areas of significant discoloration of his lower extremities, most notably on his right lower extremity. Patient denies any history of DVTs and has had no recent travel. ABI 08/31 unremarkable.   DISCHARGE INSTRUCTIONS:   Discharge Instructions     (HEART FAILURE PATIENTS) Call MD:  Anytime you have any of the following symptoms: 1) 3 pound weight gain in 24 hours or 5 pounds in 1 week 2) shortness of breath, with or without a dry hacking cough 3) swelling in the hands, feet or stomach 4) if you have to sleep on extra pillows at night in order to breathe.   Complete by: As directed    Call MD for:  redness, tenderness, or signs of infection (pain, swelling, redness, odor or green/yellow discharge around incision site)   Complete by: As directed    Diet - low sodium heart healthy   Complete by: As directed    Increase activity slowly   Complete by: As directed    Leave dressing on - Keep it clean, dry, and intact until clinic visit   Complete by: As directed        SUBJECTIVE:  Patient was seen and examined at bedside on day of discharge. He has no complaints at this time. Vision testing was repeated with patient wearing his glasses and showed improvement of R visual acuity to 20/50 and no change in L visual acuity.  Discharge Vitals:   BP (!) 177/72 (BP Location: Right Arm)   Pulse 68   Temp 98.2 F (36.8 C) (Oral)   Resp 16   Ht '5\' 7"'$  (1.702 m)   Wt 72.3 kg Comment: stood to scale  SpO2 100%   BMI 24.96 kg/m   OBJECTIVE:  Constitutional:Patient is resting comfortably in bed receiving HD. No acute distress. Eyes: No conjunctival injection bilaterally. R visual field 20/50, L visual field unchanged.  Cardio: Regular rate and rhythm. No murmurs/rubs/gallops. Pulm: Clear to auscultation bilaterally. Normal effort of breathing. Chest:  Hemodialysis port over R chest, clean dressing. Abdomen: Soft, non-tender, non-distended. MSK: Closed surgical incision over L antecubital area with bruit over fistula. Decreased bulk and tone. Skin: Thickened skin over bilateral LE with venous stasis discoloration of bilateral lower legs. Neuro: Alert and oriented x 3. Psych: Appropriate mood and affect.   Pertinent Labs, Studies, and Procedures:  CBC Latest Ref Rng & Units 02/15/2021 02/14/2021 02/13/2021  WBC 4.0 - 10.5 K/uL 5.1 6.0 7.4  Hemoglobin 13.0 -  17.0 g/dL 7.9(L) 8.1(L) 8.4(L)  Hematocrit 39.0 - 52.0 % 24.8(L) 24.8(L) 26.9(L)  Platelets 150 - 400 K/uL 83(L) 66(L) 80(L)    CMP Latest Ref Rng & Units 02/15/2021 02/14/2021 02/13/2021  Glucose 70 - 99 mg/dL 84 99 83  BUN 8 - 23 mg/dL 45(H) 33(H) 51(H)  Creatinine 0.61 - 1.24 mg/dL 5.08(H) 4.13(H) 5.11(H)  Sodium 135 - 145 mmol/L 134(L) 133(L) 136  Potassium 3.5 - 5.1 mmol/L 3.5 3.4(L) 3.4(L)  Chloride 98 - 111 mmol/L 97(L) 99 103  CO2 22 - 32 mmol/L '26 26 27  '$ Calcium 8.9 - 10.3 mg/dL 8.0(L) 7.6(L) 7.5(L)  Total Protein 6.5 - 8.1 g/dL - - -  Total Bilirubin 0.3 - 1.2 mg/dL - - -  Alkaline Phos 38 - 126 U/L - - -  AST 15 - 41 U/L - - -  ALT 0 - 44 U/L - - -    DG Chest 2 View  Result Date: 02/07/2021 CLINICAL DATA:  History of diabetes and hypertension at age 66 now with bilateral lower extremity edema and leg swelling for 3 days. EXAM: CHEST - 2 VIEW COMPARISON:  Oct 28, 2020. FINDINGS: Findings trachea midline. Heart size remains enlarged. Central pulmonary structures and hilar structures are mildly engorged. No lobar consolidative process or sign of pleural effusion. No pneumothorax. On limited assessment no acute skeletal process. IMPRESSION: Cardiomegaly with mild central pulmonary vascular congestion. Electronically Signed   By: Zetta Bills M.D.   On: 02/07/2021 13:40   CT HEAD WO CONTRAST (5MM)  Result Date: 02/07/2021 CLINICAL DATA:  64 year old male with unilateral blurred  vision by report. EXAM: CT HEAD WITHOUT CONTRAST TECHNIQUE: Contiguous axial images were obtained from the base of the skull through the vertex without intravenous contrast. COMPARISON:  None FINDINGS: Brain: No evidence of acute infarction, hemorrhage, hydrocephalus, extra-axial collection or mass lesion/mass effect. Vascular: No hyperdense vessel or unexpected calcification. Skull: Normal. Negative for fracture or focal lesion. Sinuses/Orbits: Visualized paranasal sinuses and orbits are unremarkable. Other: None IMPRESSION: No acute intracranial abnormality. Electronically Signed   By: Zetta Bills M.D.   On: 02/07/2021 14:19   VAS [place] ABI WITH/WO TBI  Result Date: 02/08/2021  LOWER EXTREMITY DOPPLER STUDY Patient Name:  DAESHON KEAN  Date of Exam:   02/08/2021 Medical Rec #: [id number]     Accession #:    [id number] Date of Birth: [birth year]     Patient Gender: M Patient Age:   65 years Exam Location:  [facility] Procedure:      VAS [place] ABI WITH/WO TBI Referring Phys: Balbina Depace MULLEN --------------------------------------------------------------------------------  Indications: Peripheral artery disease. High Risk Factors: Hypertension, Diabetes.  Comparison Study: No prior studies. Performing Technologist: Carlos Levering RVT  Examination Guidelines: A complete evaluation includes at minimum, Doppler waveform signals and systolic blood pressure reading at the level of bilateral brachial, anterior tibial, and posterior tibial arteries, when vessel segments are accessible. Bilateral testing is considered an integral part of a complete examination. Photoelectric Plethysmograph (PPG) waveforms and toe systolic pressure readings are included as required and additional duplex testing as needed. Limited examinations for reoccurring indications may be performed as noted.  ABI Findings: +---------+------------------+-----+----------+--------+ Right    Rt Pressure (mmHg)IndexWaveform  Comment   +---------+------------------+-----+----------+--------+ Brachial 165                    triphasic          +---------+------------------+-----+----------+--------+ PTA      105  0.64 monophasic         +---------+------------------+-----+----------+--------+ DP       106               0.64 monophasic         +---------+------------------+-----+----------+--------+ Great Toe26                0.16                    +---------+------------------+-----+----------+--------+ +---------+------------------+-----+---------+-------+ Left     Lt Pressure (mmHg)IndexWaveform Comment +---------+------------------+-----+---------+-------+ Brachial 164                    triphasic        +---------+------------------+-----+---------+-------+ PTA      105               0.64 biphasic         +---------+------------------+-----+---------+-------+ DP       134               0.81 biphasic         +---------+------------------+-----+---------+-------+ Great Toe44                0.27                  +---------+------------------+-----+---------+-------+ +-------+-----------+-----------+------------+------------+ ABI/TBIToday's ABIToday's TBIPrevious ABIPrevious TBI +-------+-----------+-----------+------------+------------+ Right  0.64       0.16                                +-------+-----------+-----------+------------+------------+ Left   0.81       0.27                                +-------+-----------+-----------+------------+------------+  Summary: Right: Resting right ankle-brachial index indicates moderate right lower extremity arterial disease. The right toe-brachial index is abnormal. Left: Resting left ankle-brachial index indicates mild left lower extremity arterial disease. The left toe-brachial index is abnormal.  *See table(s) above for measurements and observations.  Electronically signed by Harold Barban MD on 02/08/2021 at 6:27:45  PM.    Final    ECHOCARDIOGRAM COMPLETE  Result Date: 02/08/2021    ECHOCARDIOGRAM REPORT   Patient Name:   AYEDEN MINCHEW Date of Exam: 02/08/2021 Medical Rec #:  [id number]    Height:       67.0 in Accession #:    [id number]   Weight:       183.9 lb Date of Birth:  1955/10/01    BSA:          1.951 m Patient Age:    11 years     BP:           145/73 mmHg Patient Gender: M            HR:           68 bpm. Exam Location:  Inpatient Procedure: 2D Echo, Color Doppler and Cardiac Doppler Indications:    Congestive Heart Failure I50.9  History:        Patient has prior history of Echocardiogram examinations, most                 recent 10/29/2020. Risk Factors:Diabetes and Hypertension.  Sonographer:    Bernadene Person RDCS Referring Phys: [place]  1. Left ventricular ejection fraction, by estimation, is 55 to 60%. The left ventricle has  normal function. The left ventricle has no regional wall motion abnormalities. There is severe concentric left ventricular hypertrophy. Left ventricular diastolic  parameters are consistent with Grade II diastolic dysfunction (pseudonormalization). Elevated left ventricular end-diastolic pressure.  2. Right ventricular systolic function is normal. The right ventricular size is normal. There is severely elevated pulmonary artery systolic pressure. The estimated right ventricular systolic pressure is [id number] mmHg.  3. Left atrial size was moderately dilated.  4. Right atrial size was moderately dilated.  5. A small pericardial effusion is present. The pericardial effusion is circumferential. There is no evidence of cardiac tamponade.  6. The mitral valve is grossly normal. Mild mitral valve regurgitation. No evidence of mitral stenosis.  7. Tricuspid valve regurgitation is mild to moderate.  8. The aortic valve is grossly normal. Aortic valve regurgitation is trivial. Mild aortic valve sclerosis is present, with no evidence of aortic valve stenosis.  9. The inferior  vena cava is dilated in size with <50% respiratory variability, suggesting right atrial pressure of 15 mmHg. Comparison(s): Prior images reviewed side by side. Conclusion(s)/Recommendation(s): Severe concentric LVH, myocardium appearance concerning for infiltrative cardiomyopathy. Consider workup including PYP scan. FINDINGS  Left Ventricle: Left ventricular ejection fraction, by estimation, is 55 to 60%. The left ventricle has normal function. The left ventricle has no regional wall motion abnormalities. The left ventricular internal cavity size was normal in size. There is  severe concentric left ventricular hypertrophy. Left ventricular diastolic parameters are consistent with Grade II diastolic dysfunction (pseudonormalization). Elevated left ventricular end-diastolic pressure. Right Ventricle: The right ventricular size is normal. No increase in right ventricular wall thickness. Right ventricular systolic function is normal. There is severely elevated pulmonary artery systolic pressure. The tricuspid regurgitant velocity is 3.37 m/s, and with an assumed right atrial pressure of 15 mmHg, the estimated right ventricular systolic pressure is [id number] mmHg. Left Atrium: Left atrial size was moderately dilated. Right Atrium: Right atrial size was moderately dilated. Pericardium: A small pericardial effusion is present. The pericardial effusion is circumferential. There is no evidence of cardiac tamponade. Mitral Valve: The mitral valve is grossly normal. There is mild thickening of the mitral valve leaflet(s). Mild mitral valve regurgitation. No evidence of mitral valve stenosis. Tricuspid Valve: The tricuspid valve is normal in structure. Tricuspid valve regurgitation is mild to moderate. No evidence of tricuspid stenosis. Aortic Valve: The aortic valve is grossly normal. Aortic valve regurgitation is trivial. Mild aortic valve sclerosis is present, with no evidence of aortic valve stenosis. Pulmonic Valve: The  pulmonic valve was grossly normal. Pulmonic valve regurgitation is mild. No evidence of pulmonic stenosis. Aorta: The aortic root, ascending aorta, aortic arch and descending aorta are all structurally normal, with no evidence of dilitation or obstruction. Venous: The inferior vena cava is dilated in size with less than 50% respiratory variability, suggesting right atrial pressure of 15 mmHg. IAS/Shunts: The atrial septum is grossly normal.  LEFT VENTRICLE PLAX 2D LVIDd:         5.30 cm      Diastology LVIDs:         3.60 cm      LV e' medial:    4.11 cm/s LV PW:         1.40 cm      LV E/e' medial:  29.4 LV IVS:        1.10 cm      LV e' lateral:   5.35 cm/s LVOT diam:     2.00 cm  LV E/e' lateral: 22.6 LV SV:         79 LV SV Index:   40 LVOT Area:     3.14 cm  LV Volumes (MOD) LV vol d, MOD A2C: 168.0 ml LV vol d, MOD A4C: 174.0 ml LV vol s, MOD A2C: 90.5 ml LV vol s, MOD A4C: 82.9 ml LV SV MOD A2C:     77.5 ml LV SV MOD A4C:     174.0 ml LV SV MOD BP:      84.0 ml RIGHT VENTRICLE RV S prime:     14.10 cm/s TAPSE (M-mode): 2.3 cm LEFT ATRIUM             Index       RIGHT ATRIUM           Index LA diam:        4.10 cm 2.10 cm/m  RA Area:     20.30 cm LA Vol (A2C):   61.5 ml 31.52 ml/m RA Volume:   58.80 ml  30.13 ml/m LA Vol (A4C):   57.6 ml 29.52 ml/m LA Biplane Vol: 64.0 ml 32.80 ml/m  AORTIC VALVE LVOT Vmax:   124.00 cm/s LVOT Vmean:  79.600 cm/s LVOT VTI:    0.251 m  AORTA Ao Root diam: 3.00 cm Ao Asc diam:  3.40 cm MITRAL VALVE                TRICUSPID VALVE MV Area (PHT): 3.89 cm     TR Peak grad:   45.4 mmHg MV Decel Time: 195 msec     TR Vmax:        337.00 cm/s MV E velocity: 121.00 cm/s MV A velocity: 77.70 cm/s   SHUNTS MV E/A ratio:  1.56         Systemic VTI:  0.25 m                             Systemic Diam: 2.00 cm Buford Dresser MD Electronically signed by Buford Dresser MD Signature Date/Time: 02/08/2021/11:20:09 AM    Final    US SCROTUM W/DOPPLER  Result Date:  02/07/2021 CLINICAL DATA:  Testicular pain in a 65 year old male. EXAM: SCROTAL ULTRASOUND DOPPLER ULTRASOUND OF THE TESTICLES TECHNIQUE: Complete ultrasound examination of the testicles, epididymis, and other scrotal structures was performed. Color and spectral Doppler ultrasound were also utilized to evaluate blood flow to the testicles. COMPARISON:  Oct 28, 2020. FINDINGS: Right testicle Measurements: 3.9 x 2.8 x 2.4 cm. No visible mass, signs of abundant microlithiasis. Left testicle Measurements: 3.3 x 2.5 x 2.5 cm. No visible mass, signs of abundant microlithiasis. Right epididymis:  Normal in size and appearance. Left epididymis:  Normal in size and appearance. Hydrocele:  Bilateral hydroceles similar to the prior study. Varicocele:  None visualized. Pulsed Doppler interrogation of both testes demonstrates normal low resistance arterial and venous waveforms bilaterally. IMPRESSION: No signs of testicular torsion or testicular mass. No findings to indicate epididymitis or orchitis. Bilateral hydroceles slightly greater on the RIGHT. Signs of scrotal thickening diffusely is largely similar to the prior study. Correlate with any signs of edema or cellulitis. 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 Current literature suggests that testicular microlithiasis is not a significant independent risk factor for development of testicular carcinoma, and that follow up imaging is not warranted in the absence of other risk factors. Monthly testicular self-examination and annual physical exams are considered appropriate surveillance.  If patient has other risk factors for testicular carcinoma, then referral to Urology should be considered. (Reference: DeCastro, et al.: A 5-Year Follow up Study of Asymptomatic Men with Testicular Microlithiasis. J Urol 2008; [id number].) Electronically Signed   By: Zetta Bills M.D.   On: 02/07/2021 18:44     Signed: Farrel Gordon, D.O.  Internal Medicine Resident,  PGY-1 [facility]  Pager: [phone] 4:03 PM, 02/15/2021

## 2021-02-15 NOTE — Procedures (Addendum)
Patient was seen on dialysis and the procedure was supervised.  BFR 400  Via TDC BP is  155/67. Pt's schedule is MWF with pt to arrive at 10:40 with chair time of 11:00am.  Platelet count improving. Avoid Heparin.    Patient appears to be tolerating treatment well. Stable to dc from Renal perspective.  Jeffrey Zuniga 02/15/2021

## 2021-02-15 NOTE — Progress Notes (Signed)
Physical Therapy Treatment Patient Details Name: Jeffrey Zuniga MRN: [id number] DOB: 05-10-1956 Today's Date: 02/15/2021    History of Present Illness Jeffrey Zuniga is a 65 y.o. with a pertinent PMH of CKD stage V now ESRD, uncontrolled hypertension, anemia of chronic disease, who presented with dyspnea on exertion and admitted for severe symptomatic hypertension and progression of CKD stave V to ESRD requiring HD.    PT Comments    Pt tolerates treatment well, ambulating for community distances and tolerating multiple dynamic and gait challenges without difficulty. Pt reports feeling at or near mobility baseline and declines mobility concerns at this time. Pt has met all acute PT goals. Acute PT signing off.  Follow Up Recommendations  No PT follow up     Equipment Recommendations  None recommended by PT    Recommendations for Other Services       Precautions / Restrictions Precautions Precautions: None Restrictions Weight Bearing Restrictions: No    Mobility  Bed Mobility                    Transfers Overall transfer level: Independent                  Ambulation/Gait Ambulation/Gait assistance: Independent Gait Distance (Feet): 400 Feet Assistive device: None Gait Pattern/deviations: WFL(Within Functional Limits) Gait velocity: functional Gait velocity interpretation: >4.37 ft/sec, indicative of normal walking speed General Gait Details: pt with steady step-through gait, performs head turns, changes gait speed, changes step length and height, turns quickly, all without loss of balance   Stairs Stairs: Yes Stairs assistance: Modified independent (Device/Increase time) Stair Management: One rail Right;Alternating pattern Number of Stairs: 10     Wheelchair Mobility    Modified Rankin (Stroke Patients Only)       Balance Overall balance assessment: No apparent balance deficits (not formally assessed)                                           Cognition Arousal/Alertness: Awake/alert Behavior During Therapy: WFL for tasks assessed/performed Overall Cognitive Status: Within Functional Limits for tasks assessed                                        Exercises      General Comments General comments (skin integrity, edema, etc.): VSS on RA      Pertinent Vitals/Pain Pain Assessment: No/denies pain    Home Living                      Prior Function            PT Goals (current goals can now be found in the care plan section) Acute Rehab PT Goals Patient Stated Goal: To focus on what he needs to do for his kidneys Progress towards PT goals: Goals met/education completed, patient discharged from PT    Frequency    Min 3X/week      PT Plan Current plan remains appropriate    Co-evaluation              AM-PAC PT "6 Clicks" Mobility   Outcome Measure  Help needed turning from your back to your side while in a flat bed without using bedrails?: None Help needed moving from lying on your back to  sitting on the side of a flat bed without using bedrails?: None Help needed moving to and from a bed to a chair (including a wheelchair)?: None Help needed standing up from a chair using your arms (e.g., wheelchair or bedside chair)?: None Help needed to walk in hospital room?: None Help needed climbing 3-5 steps with a railing? : None 6 Click Score: 24    End of Session   Activity Tolerance: Patient tolerated treatment well Patient left: Other (comment) (standing in room) Nurse Communication: Mobility status PT Visit Diagnosis: Other abnormalities of gait and mobility (R26.89)     Time: 7158-0638 PT Time Calculation (min) (ACUTE ONLY): 9 min  Charges:  $Gait Training: 8-22 mins                     [name], PT, DPT Acute Rehabilitation Pager: [phone]    [name] 02/15/2021, 2:50 PM

## 2021-02-16 NOTE — Progress Notes (Signed)
Attempt to call patient for follow-up at [phone]. No answer at this time.   Dorthey Sawyer, RN  Dialysis Coordinator [phone]

## 2021-02-17 LAB — HEPATITIS C GENOTYPE

## 2021-02-17 LAB — HCV RNA QUANT RFLX ULTRA OR GENOTYP
HCV RNA Qnt(log copy/mL): 6.253 log10 IU/mL
HepC Qn: 1790000 IU/mL

## 2021-02-18 ENCOUNTER — Other Ambulatory Visit: Payer: Self-pay | Admitting: Student

## 2021-02-22 ENCOUNTER — Telehealth (HOSPITAL_COMMUNITY): Payer: Self-pay | Admitting: *Deleted

## 2021-02-22 NOTE — Progress Notes (Signed)
Attempt to call patient at home for service follow - up at [phone]. No answer at this time.  Dorthey Sawyer, RN

## 2021-03-02 ENCOUNTER — Telehealth: Payer: Self-pay

## 2021-03-02 ENCOUNTER — Other Ambulatory Visit (HOSPITAL_COMMUNITY): Payer: Self-pay

## 2021-03-02 ENCOUNTER — Other Ambulatory Visit: Payer: Self-pay

## 2021-03-02 ENCOUNTER — Encounter (HOSPITAL_COMMUNITY): Payer: Self-pay | Admitting: Emergency Medicine

## 2021-03-02 ENCOUNTER — Other Ambulatory Visit: Payer: Self-pay | Admitting: Student

## 2021-03-02 ENCOUNTER — Ambulatory Visit (INDEPENDENT_AMBULATORY_CARE_PROVIDER_SITE_OTHER): Payer: 59 | Admitting: Infectious Diseases

## 2021-03-02 ENCOUNTER — Inpatient Hospital Stay (HOSPITAL_COMMUNITY)
Admission: EM | Admit: 2021-03-02 | Discharge: 2021-03-06 | DRG: 377 | Disposition: A | Discharge status: Home / Self Care | Payer: 59 | Attending: Internal Medicine | Admitting: Internal Medicine

## 2021-03-02 ENCOUNTER — Encounter: Payer: Self-pay | Admitting: Infectious Diseases

## 2021-03-02 VITALS — BP 166/79 | HR 78 | Temp 97.6°F | Wt 160.0 lb

## 2021-03-02 DIAGNOSIS — I5042 Chronic combined systolic (congestive) and diastolic (congestive) heart failure: Secondary | ICD-10-CM | POA: Diagnosis present

## 2021-03-02 DIAGNOSIS — K3184 Gastroparesis: Secondary | ICD-10-CM | POA: Diagnosis present

## 2021-03-02 DIAGNOSIS — Z79899 Other long term (current) drug therapy: Secondary | ICD-10-CM

## 2021-03-02 DIAGNOSIS — Z992 Dependence on renal dialysis: Secondary | ICD-10-CM

## 2021-03-02 DIAGNOSIS — E1143 Type 2 diabetes mellitus with diabetic autonomic (poly)neuropathy: Secondary | ICD-10-CM | POA: Diagnosis present

## 2021-03-02 DIAGNOSIS — D631 Anemia in chronic kidney disease: Secondary | ICD-10-CM | POA: Diagnosis present

## 2021-03-02 DIAGNOSIS — D6489 Other specified anemias: Secondary | ICD-10-CM | POA: Diagnosis present

## 2021-03-02 DIAGNOSIS — K31811 Angiodysplasia of stomach and duodenum with bleeding: Secondary | ICD-10-CM | POA: Diagnosis not present

## 2021-03-02 DIAGNOSIS — B182 Chronic viral hepatitis C: Secondary | ICD-10-CM | POA: Diagnosis not present

## 2021-03-02 DIAGNOSIS — Z8249 Family history of ischemic heart disease and other diseases of the circulatory system: Secondary | ICD-10-CM

## 2021-03-02 DIAGNOSIS — E1122 Type 2 diabetes mellitus with diabetic chronic kidney disease: Secondary | ICD-10-CM | POA: Diagnosis present

## 2021-03-02 DIAGNOSIS — N2581 Secondary hyperparathyroidism of renal origin: Secondary | ICD-10-CM | POA: Diagnosis present

## 2021-03-02 DIAGNOSIS — D649 Anemia, unspecified: Secondary | ICD-10-CM | POA: Diagnosis not present

## 2021-03-02 DIAGNOSIS — Z8719 Personal history of other diseases of the digestive system: Secondary | ICD-10-CM

## 2021-03-02 DIAGNOSIS — R933 Abnormal findings on diagnostic imaging of other parts of digestive tract: Secondary | ICD-10-CM

## 2021-03-02 DIAGNOSIS — K2900 Acute gastritis without bleeding: Secondary | ICD-10-CM

## 2021-03-02 DIAGNOSIS — Z87891 Personal history of nicotine dependence: Secondary | ICD-10-CM

## 2021-03-02 DIAGNOSIS — D62 Acute posthemorrhagic anemia: Secondary | ICD-10-CM | POA: Diagnosis present

## 2021-03-02 DIAGNOSIS — H409 Unspecified glaucoma: Secondary | ICD-10-CM | POA: Diagnosis present

## 2021-03-02 DIAGNOSIS — I081 Rheumatic disorders of both mitral and tricuspid valves: Secondary | ICD-10-CM | POA: Diagnosis present

## 2021-03-02 DIAGNOSIS — Z833 Family history of diabetes mellitus: Secondary | ICD-10-CM

## 2021-03-02 DIAGNOSIS — I132 Hypertensive heart and chronic kidney disease with heart failure and with stage 5 chronic kidney disease, or end stage renal disease: Secondary | ICD-10-CM | POA: Diagnosis present

## 2021-03-02 DIAGNOSIS — K5520 Angiodysplasia of colon without hemorrhage: Secondary | ICD-10-CM

## 2021-03-02 DIAGNOSIS — D696 Thrombocytopenia, unspecified: Secondary | ICD-10-CM | POA: Diagnosis present

## 2021-03-02 DIAGNOSIS — Z20822 Contact with and (suspected) exposure to covid-19: Secondary | ICD-10-CM | POA: Diagnosis present

## 2021-03-02 DIAGNOSIS — N186 End stage renal disease: Secondary | ICD-10-CM | POA: Diagnosis present

## 2021-03-02 DIAGNOSIS — I313 Pericardial effusion (noninflammatory): Secondary | ICD-10-CM | POA: Diagnosis present

## 2021-03-02 DIAGNOSIS — B1920 Unspecified viral hepatitis C without hepatic coma: Secondary | ICD-10-CM | POA: Diagnosis present

## 2021-03-02 DIAGNOSIS — K558 Other vascular disorders of intestine: Secondary | ICD-10-CM

## 2021-03-02 LAB — COMPREHENSIVE METABOLIC PANEL
ALT: 28 U/L (ref 0–44)
AST: 32 U/L (ref 15–41)
Albumin: 3.1 g/dL — ABNORMAL LOW (ref 3.5–5.0)
Alkaline Phosphatase: 90 U/L (ref 38–126)
Anion gap: 8 (ref 5–15)
BUN: 42 mg/dL — ABNORMAL HIGH (ref 8–23)
CO2: 26 mmol/L (ref 22–32)
Calcium: 8 mg/dL — ABNORMAL LOW (ref 8.9–10.3)
Chloride: 105 mmol/L (ref 98–111)
Creatinine, Ser: 4.52 mg/dL — ABNORMAL HIGH (ref 0.61–1.24)
GFR, Estimated: 14 mL/min — ABNORMAL LOW (ref >60)
Glucose, Bld: 76 mg/dL (ref 70–99)
Potassium: 4.1 mmol/L (ref 3.5–5.1)
Sodium: 139 mmol/L (ref 135–145)
Total Bilirubin: 0.5 mg/dL (ref 0.3–1.2)
Total Protein: 6.8 g/dL (ref 6.5–8.1)

## 2021-03-02 LAB — CBC
HCT: 16.7 % — ABNORMAL LOW (ref 39.0–52.0)
Hemoglobin: 5.3 g/dL — CL (ref 13.0–17.0)
MCH: 29.4 pg (ref 26.0–34.0)
MCHC: 31.7 g/dL (ref 30.0–36.0)
MCV: 92.8 fL (ref 80.0–100.0)
Platelets: 105 10*3/uL — ABNORMAL LOW (ref 150–400)
RBC: 1.8 MIL/uL — ABNORMAL LOW (ref 4.22–5.81)
RDW: 23.6 % — ABNORMAL HIGH (ref 11.5–15.5)
WBC: 5.1 10*3/uL (ref 4.0–10.5)
nRBC: 0 % (ref 0.0–0.2)

## 2021-03-02 LAB — PREPARE RBC (CROSSMATCH)

## 2021-03-02 LAB — POC OCCULT BLOOD, ED: Fecal Occult Bld: POSITIVE — AB

## 2021-03-02 MED ORDER — CARVEDILOL 12.5 MG PO TABS
12.5000 mg | ORAL_TABLET | Freq: Two times a day (BID) | ORAL | Status: DC
  Administered 2021-03-03 – 2021-03-05 (×6): 12.5 mg via ORAL
  Filled 2021-03-02: qty 4
  Filled 2021-03-02 (×5): qty 1

## 2021-03-02 MED ORDER — ENOXAPARIN SODIUM 30 MG/0.3ML IJ SOSY
30.0000 mg | PREFILLED_SYRINGE | INTRAMUSCULAR | Status: DC
  Filled 2021-03-02 (×3): qty 0.3

## 2021-03-02 MED ORDER — PANTOPRAZOLE SODIUM 40 MG IV SOLR: 40.0000 mg | Freq: Two times a day (BID) | INTRAVENOUS | Status: DC

## 2021-03-02 MED ORDER — PANTOPRAZOLE INFUSION (NEW) - SIMPLE MED
8.0000 mg/h | INTRAVENOUS | Status: DC
  Administered 2021-03-03: 8 mg/h via INTRAVENOUS
  Filled 2021-03-02: qty 80
  Filled 2021-03-02: qty 100

## 2021-03-02 MED ORDER — AMLODIPINE BESYLATE 10 MG PO TABS
10.0000 mg | ORAL_TABLET | Freq: Every day | ORAL | Status: DC
  Administered 2021-03-03 – 2021-03-05 (×3): 10 mg via ORAL
  Filled 2021-03-02: qty 1
  Filled 2021-03-02: qty 2
  Filled 2021-03-02: qty 1

## 2021-03-02 MED ORDER — PANTOPRAZOLE 80MG IVPB - SIMPLE MED
80.0000 mg | Freq: Once | INTRAVENOUS | Status: AC
  Administered 2021-03-03: 80 mg via INTRAVENOUS
  Filled 2021-03-02: qty 80

## 2021-03-02 MED ORDER — SODIUM CHLORIDE 0.9 % IV SOLN
10.0000 mL/h | Freq: Once | INTRAVENOUS | Status: AC
  Administered 2021-03-02: 10 mL/h via INTRAVENOUS

## 2021-03-02 MED ORDER — ISOSORBIDE MONONITRATE ER 60 MG PO TB24
60.0000 mg | ORAL_TABLET | Freq: Every day | ORAL | Status: DC
  Administered 2021-03-03 – 2021-03-05 (×3): 60 mg via ORAL
  Filled 2021-03-02: qty 1
  Filled 2021-03-02: qty 2
  Filled 2021-03-02: qty 1

## 2021-03-02 MED ORDER — PANTOPRAZOLE SODIUM 40 MG IV SOLR: 40.0000 mg | Freq: Once | INTRAVENOUS | Status: DC

## 2021-03-02 MED ORDER — LATANOPROST 0.005 % OP SOLN
1.0000 [drp] | Freq: Every day | OPHTHALMIC | Status: DC
  Administered 2021-03-03 – 2021-03-05 (×3): 1 [drp] via OPHTHALMIC
  Filled 2021-03-02 (×2): qty 2.5

## 2021-03-02 NOTE — ED Provider Notes (Addendum)
[facility] EMERGENCY DEPARTMENT Provider Note   CSN: [id number] Arrival date & time: 03/02/21  1612     History Chief Complaint  Patient presents with   Anemia    Jeffrey Zuniga is a 65 y.o. male.  Patient here as outpatient labs showed hemoglobin of 4.9.  History of chronic kidney disease.  On iron pills.  States that he had dark stools for a long time since starting iron several weeks ago.  Denies any history of GI bleed.  The history is provided by the patient.  Illness Severity:  Mild Onset quality:  Gradual Timing:  Intermittent Progression:  Waxing and waning Chronicity:  Recurrent Relieved by:  Nothing Worsened by:  Nothing Associated symptoms: no abdominal pain, no chest pain, no cough, no ear pain, no fever, no rash, no shortness of breath, no sore throat and no vomiting       Past Medical History:  Diagnosis Date   Acute on chronic kidney failure ([place])    Anemia 04/04/2020   Blood transfusion without reported diagnosis    Diabetes mellitus without complication ([place])    GI bleed    H. pylori infection    Hemorrhoids    Hyperplastic colon polyp    Hypertension     Patient Active Problem List   Diagnosis Date Noted   Anemia associated with chronic renal failure    Thrombocytopenia (HCC)    Vision loss of right eye    Acute on chronic renal failure ([place]) 02/07/2021   Acute on chronic heart failure ([place]) 0000000   Helicobacter pylori gastritis 10/28/2020   Severe asymptomatic hypertension 08/29/2020   Acute on chronic combined systolic and diastolic congestive heart failure (HCC)    LVH (left ventricular hypertrophy) 04/04/2020   Iron deficiency anemia 04/02/2020   Acute heart failure with reduced ejection fraction and diastolic dysfunction ([place]) 04/02/2020   Symptomatic anemia 04/01/2020   Essential hypertension 04/01/2020   Acute kidney injury ([place]) 04/01/2020   Bilateral lower extremity edema 04/01/2020    Past Surgical  History:  Procedure Laterality Date   AV FISTULA PLACEMENT Left 02/10/2021   Procedure: LEFT UPPER EXTREMITY ARTERIOVENOUS (AV) FISTULA  CREATION;  Surgeon: Waynetta Sandy, MD;  Location: [place];  Service: Vascular;  Laterality: Left;   BIOPSY  08/31/2020   Procedure: BIOPSY;  Surgeon: Irving Copas., MD;  Location: [facility] ENDOSCOPY;  Service: Gastroenterology;;   CARDIAC CATHETERIZATION     COLONOSCOPY WITH PROPOFOL N/A 08/31/2020   Procedure: COLONOSCOPY WITH PROPOFOL;  Surgeon: Irving Copas., MD;  [facility] ENDOSCOPY;  Service: Gastroenterology;  Laterality: N/A;   ESOPHAGOGASTRODUODENOSCOPY (EGD) WITH PROPOFOL N/A 08/31/2020   Procedure: ESOPHAGOGASTRODUODENOSCOPY (EGD) WITH PROPOFOL;  Surgeon: Rush Landmark Telford Nab., MD;  Location: [place];  Service: Gastroenterology;  Laterality: N/A;   IR FLUORO GUIDE CV LINE RIGHT  02/09/2021   IR US GUIDE VASC ACCESS RIGHT  02/09/2021   POLYPECTOMY  08/31/2020   Procedure: POLYPECTOMY;  Surgeon: Mansouraty, Telford Nab., MD;  Location: [place];  Service: Gastroenterology;;       No family history on file.  Social History   Tobacco Use   Smoking status: Every Day    Packs/day: 0.25    Types: Cigarettes   Smokeless tobacco: Never   Tobacco comments:    2021  " i AM CUTTING BACK :"  Vaping Use   Vaping Use: Never used  Substance Use Topics   Alcohol use: Not Currently   Drug use: Never  Home Medications Prior to Admission medications   Medication Sig Start Date End Date Taking? Authorizing Provider  amLODipine (NORVASC) 10 MG tablet Take 1 tablet (10 mg total) by mouth daily. 10/30/20  Yes Sanjuan Dame, MD  carvedilol (COREG) 12.5 MG tablet Take 1 tablet (12.5 mg total) by mouth 2 (two) times daily with a meal. 10/30/20 03/02/22 Yes Sanjuan Dame, MD  ferrous sulfate 325 (65 FE) MG tablet Take 1 tablet (325 mg total) by mouth daily with breakfast. 10/30/20  Yes Sanjuan Dame, MD  isosorbide  mononitrate (IMDUR) 60 MG 24 hr tablet Take 1 tablet (60 mg total) by mouth daily. 02/15/21  Yes Farrel Gordon, DO  latanoprost (XALATAN) 0.005 % ophthalmic solution Place 1 drop into the left eye at bedtime. 02/16/21  Yes [provider]  pantoprazole (PROTONIX) 40 MG tablet Take 1 tablet (40 mg total) by mouth daily. Patient not taking: Reported on 03/02/2021 10/31/20   Sanjuan Dame, MD    Allergies    Heparin  Review of Systems   Review of Systems  Constitutional:  Negative for chills and fever.  HENT:  Negative for ear pain and sore throat.   Eyes:  Negative for pain and visual disturbance.  Respiratory:  Negative for cough and shortness of breath.   Cardiovascular:  Negative for chest pain and palpitations.  Gastrointestinal:  Negative for abdominal pain and vomiting.       Dark stools (chronic)   Genitourinary:  Negative for dysuria and hematuria.  Musculoskeletal:  Negative for arthralgias and back pain.  Skin:  Negative for color change and rash.  Neurological:  Negative for seizures and syncope.  All other systems reviewed and are negative.  Physical Exam Updated Vital Signs BP (!) 158/69   Pulse 65   Temp 97.8 F (36.6 C) (Oral)   Resp 15   Ht '5\' 7"'$  (1.702 m)   Wt 79.8 kg   SpO2 100%   BMI 27.57 kg/m   Physical Exam Vitals and nursing note reviewed.  Constitutional:      Appearance: He is well-developed.  HENT:     Head: Normocephalic and atraumatic.     Nose: Nose normal.     Mouth/Throat:     Mouth: Mucous membranes are moist.  Eyes:     Extraocular Movements: Extraocular movements intact.     Conjunctiva/sclera: Conjunctivae normal.     Pupils: Pupils are equal, round, and reactive to light.  Cardiovascular:     Rate and Rhythm: Normal rate and regular rhythm.     Pulses: Normal pulses.     Heart sounds: Normal heart sounds. No murmur heard. Pulmonary:     Effort: Pulmonary effort is normal. No respiratory distress.     Breath sounds: Normal  breath sounds.  Abdominal:     General: Abdomen is flat.     Palpations: Abdomen is soft.     Tenderness: There is no abdominal tenderness.  Genitourinary:    Rectum: Guaiac result positive (dark stool on exam).  Musculoskeletal:     Cervical back: Neck supple.  Skin:    General: Skin is warm and dry.     Capillary Refill: Capillary refill takes less than 2 seconds.  Neurological:     General: No focal deficit present.     Mental Status: He is alert.  Psychiatric:        Mood and Affect: Mood normal.    ED Results / Procedures / Treatments   Labs (all labs ordered are listed,  but only abnormal results are displayed) Labs Reviewed  COMPREHENSIVE METABOLIC PANEL - Abnormal; Notable for the following components:      Result Value   BUN 42 (*)    Creatinine, Ser 4.52 (*)    Calcium 8.0 (*)    Albumin 3.1 (*)    GFR, Estimated 14 (*)    All other components within normal limits  CBC - Abnormal; Notable for the following components:   RBC 1.80 (*)    Hemoglobin 5.3 (*)    HCT 16.7 (*)    RDW 23.6 (*)    Platelets 105 (*)    All other components within normal limits  POC OCCULT BLOOD, ED - Abnormal; Notable for the following components:   Fecal Occult Bld POSITIVE (*)    All other components within normal limits  RESP PANEL BY RT-PCR (FLU A&B, COVID) ARPGX2  TYPE AND SCREEN  PREPARE RBC (CROSSMATCH)    EKG EKG Interpretation  Date/Time:  Thursday March 02 2021 19:53:12 EDT Ventricular Rate:  67 PR Interval:  196 QRS Duration: 100 QT Interval:  445 QTC Calculation: 470 R Axis:   43 Text Interpretation: Sinus rhythm Borderline repolarization abnormality Confirmed by Lennice Sites ([id number]) on 03/02/2021 8:15:16 PM  Radiology No results found.  Procedures .Critical Care Performed by: Lennice Sites, DO Authorized by: Lennice Sites, DO   Critical care provider statement:    Critical care time (minutes):  35   Critical care was necessary to treat or prevent  imminent or life-threatening deterioration of the following conditions:  Circulatory failure   Critical care was time spent personally by me on the following activities:  Blood draw for specimens, development of treatment plan with patient or surrogate, discussions with primary provider, evaluation of patient's response to treatment, examination of patient, obtaining history from patient or surrogate, ordering and performing treatments and interventions, ordering and review of laboratory studies, ordering and review of radiographic studies, pulse oximetry, re-evaluation of patient's condition and review of old charts   I assumed direction of critical care for this patient from another provider in my specialty: no     Care discussed with: admitting provider     Medications Ordered in ED Medications  0.9 %  sodium chloride infusion (has no administration in time range)  pantoprazole (PROTONIX) 80 mg /NS 100 mL IVPB (has no administration in time range)  pantoprozole (PROTONIX) 80 mg /NS 100 mL infusion (has no administration in time range)  pantoprazole (PROTONIX) injection 40 mg (has no administration in time range)    ED Course  I have reviewed the triage vital signs and the nursing notes.  Pertinent labs & imaging results that were available during my care of the patient were reviewed by me and considered in my medical decision making (see chart for details).    MDM Rules/Calculators/A&P                           Jeffrey Zuniga is here with hemoglobin of 4.9 outpatient.  5.5 here.  History of CKD.  Denies history of GI bleed but upon chart review he has had EGD and colonoscopy recently that was overall unremarkable.  He does endorse dark stools recently.  But he states that he is on iron.  He states that the dark stools have been there for several weeks.  He has dark stool on exam and he has a positive Hemoccult but hemodynamically stable.  He has been evaluated with  [facility] in the past.   Lab work otherwise is unremarkable.  My suspicion that this is likely acute on chronic CKD but certainly could be a GI bleed as well.  We will type and transfuse 2 units of packed red blood cells at this time and likely will need GI referral in the morning at this time is hemodynamically stable.  We will give a dose IV Protonix.  Seems that he had a fairly significant drop in his hemoglobin over the last several weeks from 9.9 down to 5.5.  So suspect that GI bleed needs to be ruled out.  I have messaged Dr. Henrene Pastor with GI team.  We will start Protonix infusion.  Admitted to medicine service for further care.  This chart was dictated using voice recognition software.  Despite best efforts to proofread,  errors can occur which can change the documentation meaning.   Final Clinical Impression(s) / ED Diagnoses Final diagnoses:  Symptomatic anemia    Rx / DC Orders ED Discharge Orders     None        Lennice Sites, DO 03/02/21 2041    Lennice Sites, DO 03/02/21 2044    Lennice Sites, DO 03/02/21 2047

## 2021-03-02 NOTE — Telephone Encounter (Signed)
[facility] Patient Teacher, English as a foreign language completed.    The patient is insured through [employer].  Medication will need a PA . Prescription will be filled at [facility]. [facility])   We will continue to follow to see if copay assistance is needed.  Ileene Patrick, [facility] Patient [facility] Phone: [phone] Fax:  [phone]

## 2021-03-02 NOTE — Hospital Course (Addendum)
#  Symptomatic anemia Patient initially presented to ED after being made aware of outpatient labs that showed hemoglobin of 4.9.  Admission labs showed hemoglobin of 5.3.  He received 2 units packed red blood cells at that time which increased his hemoglobin to 6.8.  He received a third unit of packed red blood cells following this. Iron panel 9/23 showed iron 60, TIBC 340, saturation ratios 18, UIBC 280.  FOBT was positive on admission.  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  On 9/26 he underwent enteroscopy with findings described below.  He was continued on iron, darbepoetin alpha, Protonix during this admission.  Hemoglobin remained stable on discharge at 7.7.    #History of H. pylori gastritis H. pylori gastritis was diagnosed during EGD in 08/2020.  He was treated with triple therapy at that time and recommended to follow-up with capsule endoscopy however this has not taken place yet.  Capsule endoscopy was completed 9/24 and showed blood in the early duodenum and small bowel AVMs.  On 9/26 he underwent enteroscopy a medium amount of food in the gastric fundus and body suggestive of gastroparesis; moderate inflammation characterized by congestion, erosions, erythema in the gastric body and antrum as well as prepyloric region of the stomach which were biopsied for H. pylori; a single 5 mm erosion with stigmata of recent bleeding in the prepyloric region of the stomach which had 1 hemostatic clip placed; a single angiectasia with bleeding in the second portion of the duodenum which had fulguration to stop the bleeding by argon plasma at 0.5 L/min and 20 W as well as 1 hemostatic clip placed; a single angiectasia with no bleeding in the fourth portion of the duodenum which had fulguration to ablate the lesion to prevent bleeding by argon plasma 0.5 L/min and 20 W.  Biopsy for H. pylori is pending at time of  discharge.   #ESRD Patient undergoes hemodialysis on Monday, Wednesday, Friday.  Nephrology was consulted on admission and helped set up inpatient dialysis. He continued EPA during dialysis sessions and vitamin D supplementation for hyperparathyroidism.  He received inpatient HD on 9/23 and prior to discharge on 9/26.    #Grade 2 diastolic dysfunction Chronic.  Patient was continued on home medication of Coreg 12.5 mg twice daily during this admission.    #Hypertension Patient was hypertensive on admission with a BP max of 178/93.  He was continued on home medications of Coreg 12.5 mg twice daily, amlodipine 10 mg daily, Imdur 60 mg daily during this admission.    #Type 2 diabetes mellitus HbA1c of 4.8% on admission.  This has been diet controlled.   #Glaucoma Chronically managed.  He was continued on home latanoprost.   #Thrombocytopenia Noted initially on recent admission 8/30 through 9/7.  Platelet count was labile, though overall up-trended throughout this admission with day of discharge value of 88.   #Hepatitis C antibody positive Patient found to have positive hepatitis C antibody during most recent admission.  He has followed up with infectious disease, though he has not initiated treatment at this time.

## 2021-03-02 NOTE — Patient Instructions (Signed)
Please go to the emergency room to have them re-check your hemoglobin. 2 weeks ago it was 7.9 so that is a big change for you.    Nice to meet you today!    We need to get a little more information about your hepatitis c infection before we start your treatment.  Please come back in 3 weeks to check in again and review your pending tests and ultrasound.     ABOUT HEPATITIS C VIRUS:  Chronic Hepatitis C is the most common blood-borne infection in the [place], affecting approximately 3 million people.  It is the leading cause of cirrhosis, liver cancer, and end stage liver disease requiring transplantation when this infection goes untreated for many years  The majority of people who are infected are unaware because there are not many early symptoms that are specific to this and often go undiagnosed until a specific blood test is drawn.   The hepatitis c virus is passed primarily through direct exposure of contaminated blood or body fluids. It is most efficiently transmitted through repeated exposure to infected blood.  Risk for sexual transmission is very low but is possible if there is high frequency of unprotected sexual activity with known hepatitis c partner or multiple partners of known status.  Over time, approximately 60-70% of people can develop some degree of liver disease. Cirrhosis occurs in 10-20% of those with chronic infection. 1-5% will get liver cancer, which has a very high rate of death.   Approximately 15-25% clear the infection without medication (usually in the first 6 months of becoming exposed to virus)  Newer medications provide over 95% cure rate when taken as prescribed    IN GENERAL ABOUT DIET  Persons living with chronic hepatitis c infection should eat a diet to maintain a healthy weight and avoid nutritional deficiencies.   Completely avoiding alcohol is the best decision for your liver health. If unable to do so please limit alcohol to as little as  possible to less than 1 standard drink a day - this is very irritating to your liver.  Limit tylenol use to less than 2,000 mg daily (two extra strength tablets only twice a day)  If you have cirrhosis of the liver please take no more than 1,000 mg tylenol a day  Patients with cirrhosis should not have protein restriction; we recommend a protein intake of approximately 1.2-1.5 g/kg/day.   For patients with cirrhosis and hepatic encephalopathy, the American Association for the Study of Liver Diseases (AASLD) recommended protein intake is 1.2-1.5 g/kg/day.  If you experience ascites (fluid accumulation in the abdomen associated with severe liver damage / cirrhosis) please limit sodium intake to < 2000 mg a day    UNTIL YOU HAVE BEEN TREATED AND CURED:  Use condoms with all sexual encounters or practice abstinence to avoid sexual transmission   No sharing of razors, toothbrushes, nail clippers or anything that could potentially have blood on it.   If you cut yourself please clean and cover any wounds or open sores to others do not come into contact with your blood.   If blood spills onto item/surface please clean with 1:10 bleach solution and allow to dry, EVEN if it is dried blood.    GENERAL HELPFUL HINTS ON HCV THERAPY:  1. Stay well-hydrated.  2. Notify the [facility] of any changes in your other over-the-counter/herbal or prescription medications.  3. If you miss a dose of your medication, take the missed dose as soon as you  remember. Return to your regular time/dose schedule the next day.   4.  Do not stop taking your medications without first talking with your healthcare provider.  5.  You will see our pharmacist-specialist within the first 2 weeks of starting your medication to monitor for any possible side effects.  6.  You will have blood work once during treatment 4 weeks after your first pill. Again soon after treatment is completed and one final lab 3 months after your  last pill to ensure cure!   TIPS TO BE SUCCESSFUL WITH DAILY MEDICATION USE:  1. Set a reminder on your phone  2. Try filling out a pill box for the week - pick a day and put one pill for every day during the week so you know right away if you missed a pill.   3. Have a trusted family member ask you about your medications.   4. Smartphone app    Medication we would like to use for you will be :    [place] Instructions:  Take Epclusa, one tablet daily with or without food. You should take it at approximately the same time every day. Your treatment will be for 12 weeks. Do not miss a dose.    Do not run out of Epclusa! If you are down to one week of medication left and have not heard about your next shipment, please let us know as soon as possible. You will be given 28 days of medication at a time and provided with 2 refills.   If you need to start a new medication, prescription from your doctor or over the counter medication, you need to contact us to make sure it does not interfere with Epclusa. There are several medications that can interfere with Epclusa and can make you sick or make the medication not work.  If you need to take a medication for acid reflux, your choices are as follows: TUMS or Rolaids separated at least four hours from [place].  Pepcid (famotidine) '40mg'$  up to twice per day administered with Epclusa or 12 hours apart from [place].    Tylenol (acetominophen) and Advil (ibuprofen) are safe to take with Epclusa if needed for headache, fever, pain.   DO NOT stop Epclusa unless instructed to by your provider. If you are hospitalized while taking this medication please bring it with you to the hospital to avoid interruption of therapy. Every pill is important!  The most common side effects associated with Epclusa  include:  Fatigue Headache Nausea Diarrhea Insomnia

## 2021-03-02 NOTE — ED Triage Notes (Signed)
Patient states he was called by his dialysis center and told to go to ED because his hemoglobin from yesterday's blood draw was 4.9. Patient alert, oriented, and in no apparent distress. Denies known source of bleeding.

## 2021-03-02 NOTE — H&P (Signed)
Date: 03/02/2021               Patient Name:  Jeffrey Zuniga MRN: [id number]  DOB: Aug 12, 1955 Age / Sex: 65 y.o., male   PCP: Patient, No Pcp Per (Inactive)         Medical Service: Internal Medicine Teaching Service         Attending Physician: Dr. Sid Falcon, MD    First Contact: Dr. Farrel Gordon Pager: [phone]  Second Contact: Dr. Rick Duff Pager: [phone]       After Hours (After 5p/  First Contact Pager: [phone]  weekends / holidays): Second Contact Pager: [phone]   Chief Complaint: Symptomatic Anemia  History of Present Illness:   Jeffrey Zuniga is a 65 y.o. with a pertinent PMH of ESRD, HFrEF, hypertension, anemia of chronic disease, H pylori gastritis, who presented after he was discovered to be anemia on outpatient labs.   Patient says that he was in his regular state of health when he was at an outpatient doctor's appointment today and had labs drawn. He was told he was anemic and that he needed to go to the hospital. He has been getting dialysis as an outpatient for about two weeks now. Since starting dialysis he started taking iron pills which have caused his stools to become dark in color. He says that he has taken iron in the past and this has happened to him before. The change in his stool color coincided with the start of his iron pills. Denies any BRBPR or hematemesis or coffee ground emesis. He has noticed over the last couple of days that he can "feel his head vibrating" as well as a fluttery feeling sometimes in his chest. Does alsoendorse dizziness with standing, however only after HD sessions. Does endorse some fatigue today.Denies N/V, chills, sore throat. Denies cough, orthopnea, chest pain. Last full session of HD on Wednesday, due tomorrow. Still produces urine   Has seen GI in the past, EGD and colonoscopy in March with [facility], unremarkable. Hx of H pylori gastritis on chart review, but he does not remember anything about it. On chart review, H  pylori gastritis seen on surgical path in 08/2020. Received 14 day course of doxycycline, flagyl, and protonix.   Meds:  Current Meds  Medication Sig   amLODipine (NORVASC) 10 MG tablet Take 1 tablet (10 mg total) by mouth daily.   carvedilol (COREG) 12.5 MG tablet Take 1 tablet (12.5 mg total) by mouth 2 (two) times daily with a meal.   ferrous sulfate 325 (65 FE) MG tablet Take 1 tablet (325 mg total) by mouth daily with breakfast.   isosorbide mononitrate (IMDUR) 60 MG 24 hr tablet Take 1 tablet (60 mg total) by mouth daily.   latanoprost (XALATAN) 0.005 % ophthalmic solution Place 1 drop into the left eye at bedtime.  -Daughter helps with medications   Allergies: Allergies as of 03/02/2021   (No Known Allergies)   Past Medical History:  Diagnosis Date   Acute on chronic kidney failure ([place])    Anemia 04/04/2020   Blood transfusion without reported diagnosis    Diabetes mellitus without complication (HCC)    GI bleed    H. pylori infection    Hemorrhoids    Hyperplastic colon polyp    Hypertension     Family History:  Significant history of diabetes and HTN in his family; no history of CKD   Social History:  Lives by himself in [place]; able to  complete most of his ADLs though mentions difficulty with reading 2/2 vision problems, daughter helps with his medications No alcohol use. Remote history of marijuana and cocaine (when younger) but no recent recreational drug use. Hx of tobacco use disorder, quit 2 weeks ago. Review of Systems: A complete ROS was negative except as per HPI.   Physical Exam: Blood pressure (!) 154/75, pulse 67, temperature 98 F (36.7 C), temperature source Oral, resp. rate 17, height '5\' 7"'$  (1.702 m), weight 79.8 kg, SpO2 100 %. Gen: WNWD male resting comfortably in bed in NAD HEENT: normocephalic atraumatic, conjunctivae pale, impaired vision in left eye at baseline, sclerae anicteric CV: RRR no m/r/g, trace pitting edema, JVD present at the  edge of the mandible Resp: lungs CTAB , normal WOB Abd: soft, nontender, nondistedned [id number] all extremities spontaneously Skin: AV fistula present in the L arm with palpable thrill, right tunneled IJ Neuro: A&Ox3, answers questions appropriately  EKG: personally reviewed my interpretation is Sinus rhythm, normal axis, normal interval, T wave inversions in lead V5 and V6 on prior EKG have resolved  CXR: none  Assessment & Plan by Problem: Active Problems:   Symptomatic anemia   Jeffrey Zuniga is a 65 y.o. with a pertinent PMH of ESRD, HFrEF, hypertension, anemia of chronic disease, H pylori gastritis who was admitted for symptomatic anemia.  #Symptomatic Anemia #History of H pylori Gastritis Patient had a recent admission for which he required 2uPRBCs for a Hgb of 5.5. At that time his anemia was attributed to anemia of chronic disease secondary to his renal failure. His Hgb remained stable throughout that admission after the transfusion.  HE received darbepoetin alfa 40 mcg IV and ferric gluconate 250 mg IV on dialysis days during admission. Suspect current presentation could be multifactorial due to a combination of IDA, Anemia of chronic disease, as well as a possible upper GI bleed. EGD in 08/2020 and colonoscopy at the time showed 7m polyp that was removed in transverse colon. He was also treated with triple therapy for H Pylori recently but was recommended to have a follow up capsule endoscopy as follow up which he has not yet done. GI was consulted by the ED physician. Do not suspect patient has a brisk bleed at this time. Will follow up with GI and check post transfusion labs. Will make patient NPO after midnight in case GI wants to take him for any procedures - type and screen, 2 units pRBC - 2 large bore IVs - f/u post transfusion H/H - daily CBC - continue iron - continue darbepoetin alfa - f/u GI recs, appreciate assistance - NPO after midnight - protonix infusion -  telemetry - consider IV iron infusion  #ESRD Patient has dialysis MWF through [facility], left AV fistula still maturing. Did complete his full dialysis session yesterday. Does not appear to be volume overloaded on exam. Will reach out to nephrology to coordinate dialysis while inpatient - dialysis per nephrology - consider restarting darbepoetin alfa with dialysis - Dry weight post HD 72kg.  #Grade 2 diastolic dysfunction Last Echo in August showed Left ventricular ejection fraction 55 to 60% with severe severe concentric left ventricular hypertrophy, severely elevated pulmonary artery systolic pressure, moderately dilated atria, a small pericardial effusion, mild mitral regurg, and mild-mod tricuspid regurg. The echo noted concern for infiltrative cardiomyopathy and to consider PYP scan. Does not appear patient has followed up with cardiology  yet as an outpatient - Continue coreg 12.5 BID - Dry weight post HD 72kg.  #  HTN Patient's blood pressures are improved from prior admission where he had hypertensive emergency, reports compliance with medications and less difficulty since starting dialysis. Systolic BP has been between 138-166 since admission - continue home coreg 12.5 BID - continue home amlodipine 10 mg daily - continue Imdur 60 mg daily  #Thrombocytopenia During prior admission there was concern for possible HIT, however antibodies were WNL. Platelets are 105 on admission which are increased from prior discharge. Underlying HCV causing bone marrow suppression may be responsible for thrombocytopenia - daily CBC  #Hepatitis C antibody positive Patient follows with ID as an outpatient but has not yet started treatment - genotype 1b.   #Diet Controlled T2DM Patient reports a history of T2DM, last A1c in may was 5.3. Used to be on metformin but is now only diet controlled - f/u A1c  #Glaucoma -continue home latanoprost  Dispo: Admit patient to Observation with expected length of stay  less than 2 midnights.  Signed: Scarlett Presto, MD 03/02/2021, 11:39 PM  Pager: [phone] After 5pm on weekdays and 1pm on weekends: On Call pager: [phone]

## 2021-03-02 NOTE — Progress Notes (Signed)
Patient Name: Jeffrey Zuniga  Date of Birth: 19-Oct-1955  MRN: [id number]  PCP: Patient, No Pcp Per (Inactive)  Referring Provider: No ref. provider found, Ph#: N/A   CC:  New patient - initial evaluation and management of chronic hepatitis C infection.   HPI/ROS:  Jeffrey Zuniga is a 65 y.o. male hospitalized recently for management of HTN and heart failure symptoms. Also newly on hemodialysis treatments MWF schedule via Rt chest HD catheter until Lt AVF matures.   Found to have reactive hepatitis c antibody at that visit with RNA level 1,790,000 copies, genotype 1b. Hepatitis B surface antigen was non-reactive as was his HIV test. No abdominal imaging done with recent hospital stay - abdomen ultrasound 03/2020 reviewed showing no focal liver lesions, normal limit parenchymal echogenicity, patent portal vein with normal flow. Small RUQ ascites (improved from previous ultrasounds).   This is the first time he found out about hepatitis c infection. 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 No current sexual partners. He never shared any needles or supplies with other people. No blood transfusions needed outside of current anemia episodes a/w CKD.   Patient does not have documented immunity to Hepatitis A. Patient does not have documented immunity to Hepatitis B --> has received 1st Hep b dose 02/10/2021 in the hospital.   He received a call from HD center that his hemoglobin was only 4.9. He does not have any significant symptoms currently aside from "his heartbeat in his head and blurry vision".    Review of Systems  Constitutional:  Negative for appetite change, chills, fatigue, fever and unexpected weight change.  Eyes:  Negative for visual disturbance.  Respiratory:  Negative for cough and shortness of breath.   Cardiovascular:  Negative for chest pain and leg swelling.  Gastrointestinal:  Negative for abdominal pain, diarrhea and nausea.  Musculoskeletal:   Negative for joint swelling.  Skin:  Negative for color change, pallor and rash.  Neurological:  Positive for light-headedness and headaches. Negative for dizziness and syncope.  Hematological:  Negative for adenopathy.  Psychiatric/Behavioral:  Negative for sleep disturbance. The patient is not nervous/anxious.    All other systems reviewed and are negative      Past Medical History:  Diagnosis Date   Acute on chronic kidney failure ([place])    Anemia 04/04/2020   Blood transfusion without reported diagnosis    Diabetes mellitus without complication (HCC)    GI bleed    H. pylori infection    Hemorrhoids    Hyperplastic colon polyp    Hypertension     Prior to Admission medications   Medication Sig Start Date End Date Taking? Authorizing Provider  amLODipine (NORVASC) 10 MG tablet Take 1 tablet (10 mg total) by mouth daily. 10/30/20   Sanjuan Dame, MD  carvedilol (COREG) 12.5 MG tablet Take 1 tablet (12.5 mg total) by mouth 2 (two) times daily with a meal. 10/30/20 02/07/21  Sanjuan Dame, MD  ferrous sulfate 325 (65 FE) MG tablet Take 1 tablet (325 mg total) by mouth daily with breakfast. 10/30/20   Sanjuan Dame, MD  isosorbide mononitrate (IMDUR) 60 MG 24 hr tablet Take 1 tablet (60 mg total) by mouth daily. 02/15/21   Farrel Gordon, DO  pantoprazole (PROTONIX) 40 MG tablet Take 1 tablet (40 mg total) by mouth daily. 10/31/20   Sanjuan Dame, MD    No Known Allergies   Social History   Tobacco Use   Smoking status: Every Day  Packs/day: 0.25    Types: Cigarettes   Smokeless tobacco: Never   Tobacco comments:    2021  " i AM CUTTING BACK :"  Vaping Use   Vaping Use: Never used  Substance Use Topics   Alcohol use: Not Currently   Drug use: Never    Family History  Problem Relation Age of Onset   Liver disease Neg Hx    Liver cancer Neg Hx      Objective:   Vitals:   03/02/21 1514  BP: (!) 166/79  Pulse: 78  Temp: 97.6 F (36.4 C)  SpO2: 100%    Constitutional: in no apparent distress, well developed and well nourished, and oriented times 3 Eyes: anicteric Cardiovascular: Cor RRR Respiratory: clear Gastrointestinal: Bowel sounds are normal, liver is not enlarged, spleen is not enlarged Musculoskeletal: peripheral pulses normal, no pedal edema, no clubbing or cyanosis Skin: negative for - jaundice, spider hemangioma, telangiectasia, palmar erythema, ecchymosis and atrophy; no porphyria cutanea tarda Lymphatic: no cervical lymphadenopathy   Laboratory: Genotype:  Lab Results  Component Value Date   HCVGENOTYPE Comment 02/15/2021   HCV viral load:  Lab Results  Component Value Date   HCVQUANT  10/30/2020    Test discontinued by [facility]. Replaced with HCV Ab with reflex to Quant PCR   Lab Results  Component Value Date   WBC 4.0 03/03/2021   HGB 6.8 (LL) 03/03/2021   HCT 21.1 (L) 03/03/2021   MCV 90.9 03/03/2021   PLT 88 (L) 03/03/2021    Lab Results  Component Value Date   CREATININE 4.48 (H) 03/03/2021   BUN 44 (H) 03/03/2021   NA 139 03/03/2021   K 3.4 (L) 03/03/2021   CL 108 03/03/2021   CO2 22 03/03/2021    Lab Results  Component Value Date   ALT 28 03/02/2021   AST 32 03/02/2021   ALKPHOS 90 03/02/2021    Lab Results  Component Value Date   INR 1.1 03/03/2021   BILITOT 0.5 03/02/2021   ALBUMIN 2.7 (L) 03/03/2021      Imaging:     Assessment & Plan:   Problem List Items Addressed This Visit   None Visit Diagnoses     Chronic hepatitis C without hepatic coma ([place])    -  Primary   Relevant Orders   Liver Fibrosis, FibroTest-ActiTest   Protime-INR (Completed)   US ABDOMEN RUQ W/ELASTOGRAPHY   Hepatitis A Ab, Total      Total Encounter Time: 30 minutes   Janene Madeira, MSN, NP-C [email or web address] Pager: [phone] Office: [place]: [phone]

## 2021-03-02 NOTE — ED Provider Notes (Signed)
Emergency Medicine Provider Triage Evaluation Note  Jeffrey Zuniga , a 65 y.o. male  was evaluated in triage.  Pt here today because dialysis called him and told him his hemoglobin was 4.9.  He is a new dialysis patient.  Does endorse some dark tarry stools recently accompanied with increase in fatigue and lightheadedness.  Patient otherwise asymptomatic.  Review of Systems  Positive: Fatigue, lightheaded, melena Negative: Shortness of breath, chest pain, headache  Physical Exam  BP 138/64 (BP Location: Left Arm)   Pulse 70   Temp 97.8 F (36.6 C) (Oral)   Resp 16   Ht '5\' 7"'$  (1.702 m)   Wt 79.8 kg   SpO2 100%   BMI 27.57 kg/m  Gen:   Awake, no distress   Resp:  Normal effort  MSK:   Moves extremities without difficulty    Medical Decision Making  Medically screening exam initiated at 6:32 PM.  Appropriate orders placed.  Jeffrey Zuniga was informed that the remainder of the evaluation will be completed by another provider, this initial triage assessment does not replace that evaluation, and the importance of remaining in the ED until their evaluation is complete.     [name] 03/02/21 [place], [place], DO 03/02/21 1949

## 2021-03-03 ENCOUNTER — Encounter: Payer: Self-pay | Admitting: Infectious Diseases

## 2021-03-03 ENCOUNTER — Other Ambulatory Visit (HOSPITAL_COMMUNITY): Payer: Self-pay

## 2021-03-03 ENCOUNTER — Telehealth: Payer: Self-pay

## 2021-03-03 DIAGNOSIS — B182 Chronic viral hepatitis C: Secondary | ICD-10-CM | POA: Insufficient documentation

## 2021-03-03 DIAGNOSIS — D649 Anemia, unspecified: Secondary | ICD-10-CM | POA: Diagnosis not present

## 2021-03-03 LAB — CBC
HCT: 21.1 % — ABNORMAL LOW (ref 39.0–52.0)
Hemoglobin: 6.8 g/dL — CL (ref 13.0–17.0)
MCH: 29.3 pg (ref 26.0–34.0)
MCHC: 32.2 g/dL (ref 30.0–36.0)
MCV: 90.9 fL (ref 80.0–100.0)
Platelets: 88 10*3/uL — ABNORMAL LOW (ref 150–400)
RBC: 2.32 MIL/uL — ABNORMAL LOW (ref 4.22–5.81)
RDW: 20.6 % — ABNORMAL HIGH (ref 11.5–15.5)
WBC: 4 10*3/uL (ref 4.0–10.5)
nRBC: 0 % (ref 0.0–0.2)

## 2021-03-03 LAB — RENAL FUNCTION PANEL
Albumin: 2.7 g/dL — ABNORMAL LOW (ref 3.5–5.0)
Anion gap: 9 (ref 5–15)
BUN: 44 mg/dL — ABNORMAL HIGH (ref 8–23)
CO2: 22 mmol/L (ref 22–32)
Calcium: 7.2 mg/dL — ABNORMAL LOW (ref 8.9–10.3)
Chloride: 108 mmol/L (ref 98–111)
Creatinine, Ser: 4.48 mg/dL — ABNORMAL HIGH (ref 0.61–1.24)
GFR, Estimated: 14 mL/min — ABNORMAL LOW (ref >60)
Glucose, Bld: 93 mg/dL (ref 70–99)
Phosphorus: 5.3 mg/dL — ABNORMAL HIGH (ref 2.5–4.6)
Potassium: 3.4 mmol/L — ABNORMAL LOW (ref 3.5–5.1)
Sodium: 139 mmol/L (ref 135–145)

## 2021-03-03 LAB — HEMOGLOBIN A1C
Hgb A1c MFr Bld: 4.8 % (ref 4.8–5.6)
Mean Plasma Glucose: 91.06 mg/dL

## 2021-03-03 LAB — GLUCOSE, CAPILLARY: Glucose-Capillary: 116 mg/dL — ABNORMAL HIGH (ref 70–99)

## 2021-03-03 LAB — PREPARE RBC (CROSSMATCH)

## 2021-03-03 LAB — RESP PANEL BY RT-PCR (FLU A&B, COVID) ARPGX2
Influenza A by PCR: NEGATIVE
Influenza B by PCR: NEGATIVE
SARS Coronavirus 2 by RT PCR: NEGATIVE

## 2021-03-03 LAB — PROTIME-INR
INR: 1.1 (ref 0.8–1.2)
Prothrombin Time: 14.5 seconds (ref 11.4–15.2)

## 2021-03-03 LAB — APTT: aPTT: 31 seconds (ref 24–36)

## 2021-03-03 MED ORDER — DARBEPOETIN ALFA 200 MCG/0.4ML IJ SOSY: 200.0000 ug | PREFILLED_SYRINGE | INTRAMUSCULAR | Status: DC

## 2021-03-03 MED ORDER — SODIUM CHLORIDE 0.9% IV SOLUTION
Freq: Once | INTRAVENOUS | Status: AC
  Administered 2021-03-03: 10 mL/h via INTRAVENOUS

## 2021-03-03 MED ORDER — SODIUM CHLORIDE 0.9 % IV SOLN
250.0000 mg | Freq: Every day | INTRAVENOUS | Status: AC
  Administered 2021-03-03 – 2021-03-04 (×2): 250 mg via INTRAVENOUS
  Filled 2021-03-03 (×2): qty 20

## 2021-03-03 MED ORDER — PANTOPRAZOLE SODIUM 40 MG IV SOLR
40.0000 mg | INTRAVENOUS | Status: DC
  Administered 2021-03-03 – 2021-03-05 (×3): 40 mg via INTRAVENOUS
  Filled 2021-03-03 (×3): qty 40

## 2021-03-03 MED ORDER — PEG-KCL-NACL-NASULF-NA ASC-C 100 G PO SOLR
0.5000 | Freq: Once | ORAL | Status: AC
  Administered 2021-03-03: 100 g via ORAL
  Filled 2021-03-03: qty 1

## 2021-03-03 MED ORDER — CHLORHEXIDINE GLUCONATE CLOTH 2 % EX PADS
6.0000 | MEDICATED_PAD | Freq: Every day | CUTANEOUS | Status: DC
  Administered 2021-03-04: 6 via TOPICAL

## 2021-03-03 NOTE — Consult Note (Addendum)
Consultation  Referring Provider: Internal medicine service/Mullen MD Primary Care Physician:  Patient, No Pcp Per (Inactive) Primary Gastroenterologist:  Dr. Hilarie Fredrickson  Reason for Consultation: Severe anemia  HPI: Jeffrey Zuniga is a 65 y.o. male, who was admitted last evening after outpatient labs showed hemoglobin of 5.3/hematocrit 16.7/platelets 105. Patient has history of adult onset diabetes mellitus, chronic kidney disease, requiring hemodialysis Monday Wednesday and Friday, hypertension, congestive heart failure, and has diagnosis of hepatitis C for which he has just started treatment via infectious disease. He has history of chronic anemia and iron deficiency and had undergone GI work-up for same in March 2022 with EGD and colonoscopy.  At EGD was found to have patchy gastritis.  Biopsies of the stomach were positive for H. pylori and he was treated with a course of antibiotics.  Duodenal biopsies were negative. At colonoscopy he had a 2 mm polyp removed which was hyperplastic and was noted to have internal hemorrhoids.  Patient says he has been on oral iron at home which he has been taking and has been on Protonix.  He has not noted any melena or hematochezia.  He has no complaints of abdominal discomfort, no nausea or vomiting. He was transfused 2 units of packed RBCs last night and labs are pending this morning.  Reviewing his recent labs he has had recurring severe anemia with hemoglobin of 5.5 on 02/07/2021.  He was transfused at that time and labs shows serum iron 38/TIBC 298/iron sat 13 and ferritin of 23. 02/10/2021 hemoglobin 8.1  02/15/2021 hemoglobin 7.9   He has also had recent drift in platelet count. Upper abdominal ultrasound for October 2021 showed normal hepatic echogenicity.. He has been on Aranesp   Past Medical History:  Diagnosis Date   Acute on chronic kidney failure ([place])    Anemia 04/04/2020   Blood transfusion without reported diagnosis    Diabetes  mellitus without complication ([place])    GI bleed    H. pylori infection    Hemorrhoids    Hyperplastic colon polyp    Hypertension     Past Surgical History:  Procedure Laterality Date   AV FISTULA PLACEMENT Left 02/10/2021   Procedure: LEFT UPPER EXTREMITY ARTERIOVENOUS (AV) FISTULA  CREATION;  Surgeon: Waynetta Sandy, MD;  Location: [place];  Service: Vascular;  Laterality: Left;   BIOPSY  08/31/2020   Procedure: BIOPSY;  Surgeon: Irving Copas., MD;  Location: [place];  Service: Gastroenterology;;   CARDIAC CATHETERIZATION     COLONOSCOPY WITH PROPOFOL N/A 08/31/2020   Procedure: COLONOSCOPY WITH PROPOFOL;  Surgeon: Irving Copas., MD;  Location: [place];  Service: Gastroenterology;  Laterality: N/A;   ESOPHAGOGASTRODUODENOSCOPY (EGD) WITH PROPOFOL N/A 08/31/2020   Procedure: ESOPHAGOGASTRODUODENOSCOPY (EGD) WITH PROPOFOL;  Surgeon: Rush Landmark Telford Nab., MD;  Location: [place];  Service: Gastroenterology;  Laterality: N/A;   IR FLUORO GUIDE CV LINE RIGHT  02/09/2021   IR US GUIDE VASC ACCESS RIGHT  02/09/2021   POLYPECTOMY  08/31/2020   Procedure: POLYPECTOMY;  Surgeon: Mansouraty, Telford Nab., MD;  Location: [place];  Service: Gastroenterology;;    Prior to Admission medications   Medication Sig Start Date End Date Taking? Authorizing Provider  amLODipine (NORVASC) 10 MG tablet Take 1 tablet (10 mg total) by mouth daily. 10/30/20  Yes Sanjuan Dame, MD  carvedilol (COREG) 12.5 MG tablet Take 1 tablet (12.5 mg total) by mouth 2 (two) times daily with a meal. 10/30/20 03/02/22 Yes Sanjuan Dame, MD  ferrous sulfate 325 (  65 FE) MG tablet Take 1 tablet (325 mg total) by mouth daily with breakfast. 10/30/20  Yes Sanjuan Dame, MD  isosorbide mononitrate (IMDUR) 60 MG 24 hr tablet Take 1 tablet (60 mg total) by mouth daily. 02/15/21  Yes Farrel Gordon, DO  latanoprost (XALATAN) 0.005 % ophthalmic solution Place 1 drop into the left eye at  bedtime. 02/16/21  Yes [provider]  pantoprazole (PROTONIX) 40 MG tablet Take 1 tablet (40 mg total) by mouth daily. Patient not taking: Reported on 03/02/2021 10/31/20   Sanjuan Dame, MD    Current Facility-Administered Medications  Medication Dose Route Frequency Provider Last Rate Last Admin   amLODipine (NORVASC) tablet 10 mg  10 mg Oral Daily Virl Axe, MD       carvedilol (COREG) tablet 12.5 mg  12.5 mg Oral BID WC Virl Axe, MD   12.5 mg at 03/03/21 0204   Chlorhexidine Gluconate Cloth 2 % PADS 6 each  6 each Topical Q0600 Edrick Oh, MD       Darbepoetin Alfa (ARANESP) injection 200 mcg  200 mcg Intravenous Q Toma Aran, MD       enoxaparin (LOVENOX) injection 30 mg  30 mg Subcutaneous Q24H Virl Axe, MD       isosorbide mononitrate (IMDUR) 24 hr tablet 60 mg  60 mg Oral Daily Virl Axe, MD       latanoprost (XALATAN) 0.005 % ophthalmic solution 1 drop  1 drop Left Eye QHS Virl Axe, MD       Derrill Memo ON 03/06/2021] pantoprazole (PROTONIX) injection 40 mg  40 mg Intravenous Q12H Curatolo, Adam, DO       pantoprozole (PROTONIX) 80 mg /NS 100 mL infusion  8 mg/hr Intravenous Continuous Curatolo, Adam, DO 10 mL/hr at 03/03/21 0151 8 mg/hr at 03/03/21 0151   Current Outpatient Medications  Medication Sig Dispense Refill   amLODipine (NORVASC) 10 MG tablet Take 1 tablet (10 mg total) by mouth daily. 30 tablet 0   carvedilol (COREG) 12.5 MG tablet Take 1 tablet (12.5 mg total) by mouth 2 (two) times daily with a meal. 60 tablet 0   ferrous sulfate 325 (65 FE) MG tablet Take 1 tablet (325 mg total) by mouth daily with breakfast. 30 tablet 3   isosorbide mononitrate (IMDUR) 60 MG 24 hr tablet Take 1 tablet (60 mg total) by mouth daily. 30 tablet 2   latanoprost (XALATAN) 0.005 % ophthalmic solution Place 1 drop into the left eye at bedtime.     pantoprazole (PROTONIX) 40 MG tablet Take 1 tablet (40 mg total) by mouth daily. (Patient not taking:  Reported on 03/02/2021) 30 tablet 0    Allergies as of 03/02/2021   (No Known Allergies)    Family History  Problem Relation Age of Onset   Liver disease Neg Hx    Liver cancer Neg Hx     Social History   Socioeconomic History   Marital status: Widowed                        Tobacco Use   Smoking status: Every Day    Packs/day: 0.25    Types: Cigarettes   Smokeless tobacco: Never   Tobacco comments:    2021  " i AM CUTTING BACK :"  Vaping Use   Vaping Use: Never used  Substance and Sexual Activity   Alcohol use: Not Currently   Drug use: Never   Sexual activity: Not on file  Review of Systems: Pertinent positive and negative review of systems were noted in the above HPI section.  All other review of systems was otherwise negative.   Physical Exam: Vital signs in last 24 hours: Temp:  [97.6 F (36.4 C)-98.8 F (37.1 C)] 98.7 F (37.1 C) (09/23 0518) Pulse Rate:  [64-79] 70 (09/23 0700) Resp:  [0-33] 15 (09/23 0700) BP: (138-176)/(54-85) 176/75 (09/23 0700) SpO2:  [92 %-100 %] 98 % (09/23 0700) Weight:  [72.6 kg-79.8 kg] 79.8 kg (09/22 1631)   General:   Alert,  Well-developed, well-nourished, chronically ill-appearing older African-American male pleasant and cooperative in NAD Head:  Normocephalic and atraumatic. Eyes:  Sclera clear, no icterus.   Conjunctiva pale Ears:  Normal auditory acuity. Nose:  No deformity, discharge,  or lesions. Mouth:  No deformity or lesions.   Neck:  Supple; no masses or thyromegaly. Lungs:  Clear throughout to auscultation.   No wheezes, crackles, or rhonchi. Heart:  Regular rate and rhythm; no murmurs, clicks, rubs,  or gallops. Abdomen:  Soft,nontender, BS active,nonpalp mass or hsm.   Rectal: Not done, documented heme positive. Msk:  Symmetrical without gross deformities. . Pulses:  Normal pulses noted. Extremities:  Without clubbing or edema. Neurologic:  Alert and  oriented x4;  grossly normal neurologically. Skin:   Intact without significant lesions or rashes.. Psych:  Alert and cooperative. Normal mood and affect.    Lab Results: Recent Labs    03/02/21 1800 03/03/21 0544  WBC 5.1 4.0  HGB 5.3* 6.8*  HCT 16.7* 21.1*  PLT 105* 88*   BMET Recent Labs    03/02/21 1800 03/03/21 0544  NA 139 139  K 4.1 3.4*  CL 105 108  CO2 26 22  GLUCOSE 76 93  BUN 42* 44*  CREATININE 4.52* 4.48*  CALCIUM 8.0* 7.2*   LFT Recent Labs    03/02/21 1800 03/03/21 0544  PROT 6.8  --   ALBUMIN 3.1* 2.7*  AST 32  --   ALT 28  --   ALKPHOS 90  --   BILITOT 0.5  --    PT/INR Recent Labs    03/02/21 1546 03/03/21 0544  LABPROT 10.3 14.5  INR 1.0 1.1      IMPRESSION:  #67 65 year old African-American male with end-stage renal disease on dialysis/Monday Wednesday Friday #2 acute on chronic anemia recurrent.  Hemoglobin is also in the 5 range about 3 weeks ago and was transfused. Chronic iron deficiency Positive stool  Anemia is very likely multifactoral, he may have some component of slow GI blood loss, however EGD and colonoscopy done March 2022 unrevealing as to source. Consider small bowel source i.e. AVMs  Now also has a thrombocytopenia raising the question of underlying myelodysplastic process.  Abdominal ultrasound October 2021 no evidence of cirrhosis  #3 adult onset diabetes mellitus #4 hypertension #5 hepatitis C-recently initiated treatment/being followed by infectious disease #6 history of congestive heart failure   PLAN: Transfuse to keep hemoglobin between 7 and 8 If he has not received IV iron this admission would start IV iron infusions We will schedule for capsule endoscopy for tomorrow.  Procedure was discussed with patient including indications risk and benefits and he is agreeable to proceed.  Bowel prep this evening Continue Protonix orally, he does not need Protonix infusion Check upper abdominal ultrasound to assess for any evidence of cirrhosis      Amy  Esterwood  PA-C9/23/2022, 9:24 AM     [facility] Attending   I have taken  al history, reviewed the chart and examined the patient. I agree with the Advanced Practitioner's note, impression and recommendations.  Majority the medical decision-making in the formulation of the assessment and plan were performed by me.  Gatha Mayer, MD, [facility] 03/03/2021 1:26 PM

## 2021-03-03 NOTE — ED Notes (Signed)
Patient is getting blood at this time

## 2021-03-03 NOTE — Telephone Encounter (Signed)
[facility] Patient Advocate Encounter   Received notification from [facility] that prior authorization for Raeanne Gathers is required.   PA submitted on 03/03/21 Key [id number] Status is pending    [facility] will continue to follow.   Ileene Patrick, [facility] Patient [facility] Phone: [phone] Fax:  [phone]

## 2021-03-03 NOTE — Progress Notes (Signed)
HD#0 SUBJECTIVE:  Patient Summary: Jeffrey Zuniga is a 65 y.o. with a pertinent PMH of ESRD, HFrEF, hypertension, anemia of chronic disease, H pylori gastritis, who presented with anemia with hemoglobin less than 5 and admitted for symptomatic anemia.   Overnight Events: Patient received 2 units packed red blood cells overnight  Interim History: Patient evaluated at bedside.  He has no complaints at this time.  He states that he has been doing well generally since recent discharge.  OBJECTIVE:  Vital Signs: Vitals:   03/03/21 1145 03/03/21 1158 03/03/21 1230 03/03/21 1315  BP: (!) 176/84 (!) 176/84 (!) 173/96 (!) 171/63  Pulse: 73 73 72 74  Resp: '16  14 18  '$ Temp:      TempSrc:      SpO2: 97%  96% 94%  Weight:      Height:       Supplemental O2: Room Air SpO2: 94 %  Filed Weights   03/02/21 1631  Weight: 79.8 kg     Intake/Output Summary (Last 24 hours) at 03/03/2021 1529 Last data filed at 03/03/2021 1157 Gross per 24 hour  Intake 1630.31 ml  Output --  Net 1630.31 ml   Net IO Since Admission: 1,630.31 mL [03/03/21 1529]  Physical Exam: Constitutional: Chronically ill appearing gentleman laying comfortably in bed.  No acute distress noted. Cardio: Regular rate and rhythm.  No murmurs, rubs, gallops. Pulm: Clear to auscultation bilaterally.  Normal work of breathing on room air. Chest: Right IJ tunneled catheter. Abdomen: Soft, nontender, nondistended. MSK: Left AV fistula with palpable thrill. Skin: Skin is warm and dry. Neuro: Alert and oriented x3.  No focal deficit noted. Psych: Normal mood and affect.   Patient Lines/Drains/Airways Status     Active Line/Drains/Airways     Name Placement date Placement time Site Days   Peripheral IV 02/10/21 18 G Posterior;Proximal;Right Forearm 02/10/21  1140  Forearm  21   Peripheral IV 03/02/21 22 G Right Antecubital 03/02/21  2100  Antecubital  1   Peripheral IV 03/03/21 20 G Right Forearm 03/03/21  0015  Forearm   less than 1   Fistula / Graft Left Upper arm Arteriovenous fistula 02/10/21  1438  Upper arm  21   Hemodialysis Catheter Right Internal jugular Double lumen Permanent (Tunneled) 02/09/21  0958  Internal jugular  22   Incision (Closed) 02/10/21 Arm Left 02/10/21  1440  -- 21             ASSESSMENT/PLAN:  Assessment: Active Problems:   Symptomatic anemia   Plan: #Symptomatic anemia On presentation, patient reported outpatient labs showing hemoglobin of 4.9.  Admission labs showed hemoglobin of 5.3.  He received 2 units packed red blood cells with increase of hemoglobin to 6.8.  He is currently receiving a third unit and posttransfusion labs with posttransfusion labs to follow. Of note patient was recently discharged on 9/7 during which day he presented with a hemoglobin of 5.5, requiring 2 units of packed red blood cells and after his hemoglobin level remained stable.  He was discharged with a hemoglobin level of 8.1.  The source of his anemia is thought to be related to iron deficiency versus anemia of chronic disease secondary to chronic renal failure.  He does receive darbepoetin alfa 40 mcg IV and ferric gluconate 250 mg IV on dialysis days (Monday, Wednesday, Friday) at home he takes an oral iron supplement as well as Protonix.  FOBT was positive on admission.  Iron panel 9/23 showed iron 60,  TIBC 340, saturation ratios 18, UIBC 280. -GI on board, appreciate their recommendations  -Patient will undergo capsule endoscopy tomorrow  -Right upper quadrant ultrasound pending -Trend CBC -Transfuse if hemoglobin less than 7 -Continue iron, darbepoetin alfa -Continue Protonix  #History of H. pylori gastritis H. pylori gastritis was diagnosed during EGD in 08/2020.  He was treated with triple therapy at that time and recommended to follow-up with capsule endoscopy however this has not taken place yet. -Capsule endoscopy tomorrow  #ESRD Patient was initiated on hemodialysis during most  recent admission 8/30-9/7.  He undergoes hemodialysis on Mondays, Wednesdays, Fridays.  He reports last full hemodialysis session was completed on 9/21.  He states that he does feel fatigued and sometimes weak following hemodialysis sessions but that he is overall tolerating them well.  He will undergo dialysis today though he has not gone up for this at this time. -Nephrology consulted, appreciate their recommendations  -Continue EPA  -Vitamin D for secondary hyperparathyroidism -Trend BMP  #Grade 2 diastolic dysfunction Echocardiogram 8/31 showed LVEF of 55 to 60% with normal function, severe LVH and elevated LV end-diastolic pressure, consistent with grade 2 diastolic dysfunction.  Pulmonary artery systolic pressure was found to be severely elevated, estimated right ventricular systolic pressure of [id number] mmHg.  Left and right atria were both moderately dilated.  A small pericardial effusion was noted.  There was mild mitral valve and tricuspid valve regurgitation. -Continue Coreg 12.5 mg twice daily  #Hypertension Patient was recently treated for hypertensive emergency 8/30.  CT head at that time was negative for any intracranial abnormality.  He was restarted at that time on home amlodipine 10 mg, carvedilol 12.5 mg, and initiated on Imdur 60 mg. -Continue Coreg 12.5 mg twice daily -Continue amlodipine 10 mg daily -Continue Imdur 60 mg daily  #Thrombocytopenia Thrombocytopenia was new during admission 8/30-9/7.  There was concern that this was related to HIT in the setting of new hemodialysis however HIT antibody was negative.  Admission platelet level is increased from recent admission. -Trend CBC  #Hepatitis C antibody positive Patient found to be hepatitis C genotype 1b positive.  He has not started treatment with infectious disease yet however has been seen by their clinic as an outpatient.  #Type 2 diabetes mellitus HbA1c of 4.8%.  #Glaucoma Patient has a self-reported history of  glaucoma in his left eye for which he receives eyedrops.  On most recent admission 8/30 through 9/7 he reported new vision loss in his right eye that had progressed over a week and a half.  Visual acuity to light perception only in his left eye and 20/50 in the right eye with glasses.  Outpatient work and had been set up for the day following his discharge however the patient reports he did not go to this visit. -Continue home latanoprost   Best Practice: Diet: Renal diet with 1.2 L fluid restriction; n.p.o. at midnight IVF: Fluids: None VTE: enoxaparin (LOVENOX) injection 30 mg Start: 03/03/21 1200 Code: Full AB: None DISPO: Anticipated discharge in 1-3 days to Home pending Medical stability.  Signature: Farrel Gordon, D.O.  Internal Medicine Resident, PGY-1 [facility]  Pager: [phone] 3:29 PM, 03/03/2021   Please contact the on call pager after 5 pm and on weekends at [phone].

## 2021-03-03 NOTE — Consult Note (Signed)
Referring Provider: No ref. provider found Primary Care Physician:  Patient, No Pcp Per (Inactive) Primary Nephrologist:  [facility]  Reason for Consultation:   Medical management end-stage renal disease, assessment treatment of anemia, assessment treatment of secondary parathyroidism, maintenance of euvolemia.  HPI: This is a 65 year old gentleman who is a history of end-stage renal disease Monday Wednesday Friday dialysis [facility].  He has a history of hypertension anemia of chronic disease and diastolic heart failure.  He was brought to the emergency room with increasing anemia.  He has been seen by GI in the past and has undergone EGD and colonoscopy in March which was unremarkable.  Surgical pathology was pertinent for H. pylori 08/2020.  Blood pressure 170/70 pulse 72 temperature 98.7 O2 sats 97% room air  Sodium 139 potassium 3.4 chloride 108 CO2 22 BUN 44 creatinine 4.48 glucose 93 calcium 7.2 phosphorus 5.3 albumin 2.3 hemoglobin 6.8  Medications: Protonix , amlodipine, Coreg, isosorbide   Past Medical History:  Diagnosis Date   Acute on chronic kidney failure ([place])    Anemia 04/04/2020   Blood transfusion without reported diagnosis    Diabetes mellitus without complication ([place])    GI bleed    H. pylori infection    Hemorrhoids    Hyperplastic colon polyp    Hypertension     Past Surgical History:  Procedure Laterality Date   AV FISTULA PLACEMENT Left 02/10/2021   Procedure: LEFT UPPER EXTREMITY ARTERIOVENOUS (AV) FISTULA  CREATION;  Surgeon: Waynetta Sandy, MD;  Location: [facility];  Service: Vascular;  Laterality: Left;   BIOPSY  08/31/2020   Procedure: BIOPSY;  Surgeon: Irving Copas., MD;  Location: [facility] ENDOSCOPY;  Service: Gastroenterology;;   CARDIAC CATHETERIZATION     COLONOSCOPY WITH PROPOFOL N/A 08/31/2020   Procedure: COLONOSCOPY WITH PROPOFOL;  Surgeon: Irving Copas., MD;  Location: [facility];  Service: Gastroenterology;   Laterality: N/A;   ESOPHAGOGASTRODUODENOSCOPY (EGD) WITH PROPOFOL N/A 08/31/2020   Procedure: ESOPHAGOGASTRODUODENOSCOPY (EGD) WITH PROPOFOL;  Surgeon: Rush Landmark Telford Nab., MD;  Location: [facility];  Service: Gastroenterology;  Laterality: N/A;   IR FLUORO GUIDE CV LINE RIGHT  02/09/2021   IR US GUIDE VASC ACCESS RIGHT  02/09/2021   POLYPECTOMY  08/31/2020   Procedure: POLYPECTOMY;  Surgeon: Mansouraty, Telford Nab., MD;  Location: [place];  Service: Gastroenterology;;    Prior to Admission medications   Medication Sig Start Date End Date Taking? Authorizing Provider  amLODipine (NORVASC) 10 MG tablet Take 1 tablet (10 mg total) by mouth daily. 10/30/20  Yes Sanjuan Dame, MD  carvedilol (COREG) 12.5 MG tablet Take 1 tablet (12.5 mg total) by mouth 2 (two) times daily with a meal. 10/30/20 03/02/22 Yes Sanjuan Dame, MD  ferrous sulfate 325 (65 FE) MG tablet Take 1 tablet (325 mg total) by mouth daily with breakfast. 10/30/20  Yes Sanjuan Dame, MD  isosorbide mononitrate (IMDUR) 60 MG 24 hr tablet Take 1 tablet (60 mg total) by mouth daily. 02/15/21  Yes Farrel Gordon, DO  latanoprost (XALATAN) 0.005 % ophthalmic solution Place 1 drop into the left eye at bedtime. 02/16/21  Yes [provider]  pantoprazole (PROTONIX) 40 MG tablet Take 1 tablet (40 mg total) by mouth daily. Patient not taking: Reported on 03/02/2021 10/31/20   Sanjuan Dame, MD    Current Facility-Administered Medications  Medication Dose Route Frequency Provider Last Rate Last Admin   amLODipine (NORVASC) tablet 10 mg  10 mg Oral Daily Virl Axe, MD  carvedilol (COREG) tablet 12.5 mg  12.5 mg Oral BID WC Virl Axe, MD   12.5 mg at 03/03/21 0204   enoxaparin (LOVENOX) injection 30 mg  30 mg Subcutaneous Q24H Virl Axe, MD       isosorbide mononitrate (IMDUR) 24 hr tablet 60 mg  60 mg Oral Daily Virl Axe, MD       latanoprost (XALATAN) 0.005 % ophthalmic solution 1 drop  1 drop  Left Eye QHS Virl Axe, MD       Derrill Memo ON 03/06/2021] pantoprazole (PROTONIX) injection 40 mg  40 mg Intravenous Q12H Curatolo, Adam, DO       pantoprozole (PROTONIX) 80 mg /NS 100 mL infusion  8 mg/hr Intravenous Continuous Curatolo, Adam, DO 10 mL/hr at 03/03/21 0151 8 mg/hr at 03/03/21 0151   Current Outpatient Medications  Medication Sig Dispense Refill   amLODipine (NORVASC) 10 MG tablet Take 1 tablet (10 mg total) by mouth daily. 30 tablet 0   carvedilol (COREG) 12.5 MG tablet Take 1 tablet (12.5 mg total) by mouth 2 (two) times daily with a meal. 60 tablet 0   ferrous sulfate 325 (65 FE) MG tablet Take 1 tablet (325 mg total) by mouth daily with breakfast. 30 tablet 3   isosorbide mononitrate (IMDUR) 60 MG 24 hr tablet Take 1 tablet (60 mg total) by mouth daily. 30 tablet 2   latanoprost (XALATAN) 0.005 % ophthalmic solution Place 1 drop into the left eye at bedtime.     pantoprazole (PROTONIX) 40 MG tablet Take 1 tablet (40 mg total) by mouth daily. (Patient not taking: Reported on 03/02/2021) 30 tablet 0    Allergies as of 03/02/2021   (No Known Allergies)    No family history on file.  Social History   Socioeconomic History   Marital status: Widowed    Spouse name: Not on file   Number of children: Not on file   Years of education: Not on file   Highest education level: Not on file  Occupational History   Not on file  Tobacco Use   Smoking status: Every Day    Packs/day: 0.25    Types: Cigarettes   Smokeless tobacco: Never   Tobacco comments:    2021  " i AM CUTTING BACK :"  Vaping Use   Vaping Use: Never used  Substance and Sexual Activity   Alcohol use: Not Currently   Drug use: Never   Sexual activity: Not on file  Other Topics Concern   Not on file  Social History Narrative   Not on file   Social Determinants of Health   Financial Resource Strain: Not on file  Food Insecurity: Not on file  Transportation Needs: Not on file  Physical Activity: Not  on file  Stress: Not on file  Social Connections: Not on file  Intimate Partner Violence: Not on file    Review of Systems: Gen: Denies any fever, chills, sweats, anorexia, fatigue, weakness, malaise, weight loss, and sleep disorder HEENT: No visual complaints, No history of Retinopathy. Normal external appearance No Epistaxis or Sore throat. No sinusitis.   CV: Denies chest pain, angina, palpitations, syncope, orthopnea, PND, peripheral edema, and claudication. Resp: Denies dyspnea at rest, dyspnea with exercise, cough, sputum, wheezing, coughing up blood, and pleurisy. GI: Denies vomiting blood, jaundice, and fecal incontinence.   Denies dysphagia or odynophagia. GU : Denies urinary burning, blood in urine, urinary frequency, urinary hesitancy, nocturnal urination, and urinary incontinence.  No renal calculi. MS: Denies joint pain,  limitation of movement, and swelling, stiffness, low back pain, extremity pain. Denies muscle weakness, cramps, atrophy.  No use of non steroidal antiinflammatory drugs. Derm: Denies rash, itching, dry skin, hives, moles, warts, or unhealing ulcers.  Psych: Denies depression, anxiety, memory loss, suicidal ideation, hallucinations, paranoia, and confusion. Heme: Denies bruising, bleeding, and enlarged lymph nodes. Neuro: No headache.  No diplopia. No dysarthria.  No dysphasia.  No history of CVA.  No Seizures. No paresthesias.  No weakness. Endocrine No DM.  No Thyroid disease.  No Adrenal disease.  Physical Exam: Vital signs in last 24 hours: Temp:  [97.6 F (36.4 C)-98.8 F (37.1 C)] 98.7 F (37.1 C) (09/23 0518) Pulse Rate:  [64-79] 70 (09/23 0700) Resp:  [0-33] 15 (09/23 0700) BP: (138-176)/(54-85) 176/75 (09/23 0700) SpO2:  [92 %-100 %] 98 % (09/23 0700) Weight:  [72.6 kg-79.8 kg] 79.8 kg (09/22 1631)   General:   Alert,  Well-developed, well-nourished, pleasant and cooperative in NAD Head:  Normocephalic and atraumatic. Eyes:  Sclera clear, no  icterus.   Conjunctiva pink. Ears:  Normal auditory acuity. Nose:  No deformity, discharge,  or lesions. Mouth:  No deformity or lesions, dentition normal. Neck:  Supple; no masses or thyromegaly. JVP not elevated Lungs:  Clear throughout to auscultation.   No wheezes, crackles, or rhonchi. No acute distress. Heart:  Regular rate and rhythm; no murmurs, clicks, rubs,  or gallops. Abdomen:  Soft, nontender and nondistended. No masses, hepatosplenomegaly or hernias noted. Normal bowel sounds, without guarding, and without rebound.   Msk:  Symmetrical without gross deformities. Normal posture. Pulses:  No carotid, renal, femoral bruits. DP and PT symmetrical and equal Extremities:  Without clubbing or edema.  Tunneled dialysis cath on the right.  Left arm AV fistula Neurologic:  Alert and  oriented x4;  grossly normal neurologically. Skin:  Intact without significant lesions or rashes. Cervical Nodes:  No significant cervical adenopathy. Psych:  Alert and cooperative. Normal mood and affect.  Intake/Output from previous day: 09/22 0701 - 09/23 0700 In: 1530 [I.V.:100; Blood:1330; IV Piggyback:100] Out: -  Intake/Output this shift: No intake/output data recorded.  Lab Results: Recent Labs    03/02/21 1800 03/03/21 0544  WBC 5.1 4.0  HGB 5.3* 6.8*  HCT 16.7* 21.1*  PLT 105* 88*   BMET Recent Labs    03/02/21 1800 03/03/21 0544  NA 139 139  K 4.1 3.4*  CL 105 108  CO2 26 22  GLUCOSE 76 93  BUN 42* 44*  CREATININE 4.52* 4.48*  CALCIUM 8.0* 7.2*  PHOS  --  5.3*   LFT Recent Labs    03/02/21 1800 03/03/21 0544  PROT 6.8  --   ALBUMIN 3.1* 2.7*  AST 32  --   ALT 28  --   ALKPHOS 90  --   BILITOT 0.5  --    PT/INR Recent Labs    03/02/21 1546 03/03/21 0544  LABPROT 10.3 14.5  INR 1.0 1.1   Hepatitis Panel No results for input(s): HEPBSAG, HCVAB, HEPAIGM, HEPBIGM in the last 72 hours.  Studies/Results: No results found.  Assessment/Plan: ESRD-Monday  Wednesday Friday dialysis patient.  We will dialyze 03/03/2021. ANEMIA-work-up as per primary service.  Status post transfusion with improvement of hemoglobin 6.3.  Acute blood loss anemia.  We will continue ESA MBD-continue to follow renal panel on daily basis.  Evaluate for binder use.  Secondary hyperparathyroidism treatment with vitamin D therapy. HTN/VOL-appears to be euvolemic at this time. ACCESS-has AV fistula but dialyzes  through dialysis catheter.    LOS: 0 Sherril Croon '@TODAY''@8'$ :27 AM

## 2021-03-03 NOTE — Assessment & Plan Note (Signed)
Recommended evaluation in ER for severe anemia and recently reported hgb 4.9. He is not terribly symptomatic and OK to go private vehicle. Unfortunately we cannot get stat repeat labs here in the clinic and I explained if we tried helping with this it would just delay his care. He understands and will have his daughter come pick him up.

## 2021-03-03 NOTE — Assessment & Plan Note (Signed)
New Patient with Chronic Hepatitis C genotype 1b, VL 1,790,000 copies, treatment naive. Fibrosis risk unknown.   I discussed with the patient the lab findings that confirm chronic hepatitis C as well as the natural history and progression of disease including about 30% of people who develop cirrhosis of the liver if left untreated and once cirrhosis is established there is a 2-7% risk per year of liver cancer and liver failure.  I discussed the importance of treatment and benefits in reducing the risk, even if significant liver fibrosis exists. I also discussed risk for re-infection following treatment should he not continue to modify risk factors.    Patient counseled extensively on limiting acetaminophen to no more than 2 grams daily, avoidance of alcohol.  Transmission discussed with patient including sexual transmission, sharing razors and toothbrush.   Will need referral to gastroenterology if concern for cirrhosis  He has received ESRD dose of hep b #1 and will need 2nd dose 8 week after  Pneumovax vaccine at upcoming visit if not previously given  Further work up to include liver staging through non-invasive serum analysis with APRI and FIB4 scores and Liver Fibrosis panel; U/S will be repeated given concern for chronic infection > 25 years.   Will plan for Epclusa (friendly for HD and he can continue his PPI) x 12 weeks.   Will schedule a return visit in 3 weeks so we can go over this more in detail, review results and get him started on treatment. Currently severe anemia is more of a priority for him.

## 2021-03-04 ENCOUNTER — Encounter (HOSPITAL_COMMUNITY)
Admission: EM | Disposition: A | Discharge status: Home / Self Care | Payer: Self-pay | Source: Home / Self Care | Attending: Internal Medicine

## 2021-03-04 DIAGNOSIS — R195 Other fecal abnormalities: Secondary | ICD-10-CM | POA: Diagnosis not present

## 2021-03-04 DIAGNOSIS — N2581 Secondary hyperparathyroidism of renal origin: Secondary | ICD-10-CM | POA: Diagnosis present

## 2021-03-04 DIAGNOSIS — Z87891 Personal history of nicotine dependence: Secondary | ICD-10-CM | POA: Diagnosis not present

## 2021-03-04 DIAGNOSIS — I081 Rheumatic disorders of both mitral and tricuspid valves: Secondary | ICD-10-CM | POA: Diagnosis present

## 2021-03-04 DIAGNOSIS — E1143 Type 2 diabetes mellitus with diabetic autonomic (poly)neuropathy: Secondary | ICD-10-CM | POA: Diagnosis present

## 2021-03-04 DIAGNOSIS — D631 Anemia in chronic kidney disease: Secondary | ICD-10-CM | POA: Diagnosis present

## 2021-03-04 DIAGNOSIS — B1920 Unspecified viral hepatitis C without hepatic coma: Secondary | ICD-10-CM | POA: Diagnosis present

## 2021-03-04 DIAGNOSIS — Z833 Family history of diabetes mellitus: Secondary | ICD-10-CM | POA: Diagnosis not present

## 2021-03-04 DIAGNOSIS — K558 Other vascular disorders of intestine: Secondary | ICD-10-CM | POA: Diagnosis not present

## 2021-03-04 DIAGNOSIS — I313 Pericardial effusion (noninflammatory): Secondary | ICD-10-CM | POA: Diagnosis present

## 2021-03-04 DIAGNOSIS — Z992 Dependence on renal dialysis: Secondary | ICD-10-CM | POA: Diagnosis not present

## 2021-03-04 DIAGNOSIS — K31811 Angiodysplasia of stomach and duodenum with bleeding: Secondary | ICD-10-CM | POA: Diagnosis present

## 2021-03-04 DIAGNOSIS — K3184 Gastroparesis: Secondary | ICD-10-CM | POA: Diagnosis present

## 2021-03-04 DIAGNOSIS — K5520 Angiodysplasia of colon without hemorrhage: Secondary | ICD-10-CM | POA: Diagnosis present

## 2021-03-04 DIAGNOSIS — D696 Thrombocytopenia, unspecified: Secondary | ICD-10-CM | POA: Diagnosis present

## 2021-03-04 DIAGNOSIS — H409 Unspecified glaucoma: Secondary | ICD-10-CM | POA: Diagnosis present

## 2021-03-04 DIAGNOSIS — K2900 Acute gastritis without bleeding: Secondary | ICD-10-CM | POA: Diagnosis present

## 2021-03-04 DIAGNOSIS — N186 End stage renal disease: Secondary | ICD-10-CM | POA: Diagnosis present

## 2021-03-04 DIAGNOSIS — D649 Anemia, unspecified: Secondary | ICD-10-CM | POA: Diagnosis present

## 2021-03-04 DIAGNOSIS — I132 Hypertensive heart and chronic kidney disease with heart failure and with stage 5 chronic kidney disease, or end stage renal disease: Secondary | ICD-10-CM | POA: Diagnosis present

## 2021-03-04 DIAGNOSIS — D62 Acute posthemorrhagic anemia: Secondary | ICD-10-CM | POA: Diagnosis present

## 2021-03-04 DIAGNOSIS — Z79899 Other long term (current) drug therapy: Secondary | ICD-10-CM | POA: Diagnosis not present

## 2021-03-04 DIAGNOSIS — K922 Gastrointestinal hemorrhage, unspecified: Secondary | ICD-10-CM | POA: Diagnosis not present

## 2021-03-04 DIAGNOSIS — Z8249 Family history of ischemic heart disease and other diseases of the circulatory system: Secondary | ICD-10-CM | POA: Diagnosis not present

## 2021-03-04 DIAGNOSIS — Z8719 Personal history of other diseases of the digestive system: Secondary | ICD-10-CM | POA: Diagnosis not present

## 2021-03-04 DIAGNOSIS — I5042 Chronic combined systolic (congestive) and diastolic (congestive) heart failure: Secondary | ICD-10-CM | POA: Diagnosis present

## 2021-03-04 DIAGNOSIS — E1122 Type 2 diabetes mellitus with diabetic chronic kidney disease: Secondary | ICD-10-CM | POA: Diagnosis present

## 2021-03-04 DIAGNOSIS — K2970 Gastritis, unspecified, without bleeding: Secondary | ICD-10-CM | POA: Diagnosis not present

## 2021-03-04 DIAGNOSIS — Z20822 Contact with and (suspected) exposure to covid-19: Secondary | ICD-10-CM | POA: Diagnosis present

## 2021-03-04 HISTORY — PX: GIVENS CAPSULE STUDY: SHX5432

## 2021-03-04 LAB — TYPE AND SCREEN
ABO/RH(D): O POS
Antibody Screen: NEGATIVE
Unit division: 0
Unit division: 0
Unit division: 0

## 2021-03-04 LAB — CBC
HCT: 24 % — ABNORMAL LOW (ref 39.0–52.0)
Hemoglobin: 7.9 g/dL — ABNORMAL LOW (ref 13.0–17.0)
MCH: 29.7 pg (ref 26.0–34.0)
MCHC: 32.9 g/dL (ref 30.0–36.0)
MCV: 90.2 fL (ref 80.0–100.0)
Platelets: 100 10*3/uL — ABNORMAL LOW (ref 150–400)
RBC: 2.66 MIL/uL — ABNORMAL LOW (ref 4.22–5.81)
RDW: 19.9 % — ABNORMAL HIGH (ref 11.5–15.5)
WBC: 5.2 10*3/uL (ref 4.0–10.5)
nRBC: 0 % (ref 0.0–0.2)

## 2021-03-04 LAB — FOLATE: Folate: 13.7 ng/mL (ref >5.9)

## 2021-03-04 LAB — RENAL FUNCTION PANEL
Albumin: 2.9 g/dL — ABNORMAL LOW (ref 3.5–5.0)
Anion gap: 10 (ref 5–15)
BUN: 53 mg/dL — ABNORMAL HIGH (ref 8–23)
CO2: 23 mmol/L (ref 22–32)
Calcium: 7.8 mg/dL — ABNORMAL LOW (ref 8.9–10.3)
Chloride: 106 mmol/L (ref 98–111)
Creatinine, Ser: 5.55 mg/dL — ABNORMAL HIGH (ref 0.61–1.24)
GFR, Estimated: 11 mL/min — ABNORMAL LOW (ref >60)
Glucose, Bld: 92 mg/dL (ref 70–99)
Phosphorus: 4.9 mg/dL — ABNORMAL HIGH (ref 2.5–4.6)
Potassium: 4 mmol/L (ref 3.5–5.1)
Sodium: 139 mmol/L (ref 135–145)

## 2021-03-04 LAB — RETICULOCYTES
Immature Retic Fract: 34.8 % — ABNORMAL HIGH (ref 2.3–15.9)
RBC.: 2.61 MIL/uL — ABNORMAL LOW (ref 4.22–5.81)
Retic Count, Absolute: 134.4 10*3/uL (ref 19.0–186.0)
Retic Ct Pct: 5.2 % — ABNORMAL HIGH (ref 0.4–3.1)

## 2021-03-04 LAB — IRON AND TIBC
Iron: 62 ug/dL (ref 45–182)
Saturation Ratios: 19 % (ref 17.9–39.5)
TIBC: 330 ug/dL (ref 250–450)
UIBC: 268 ug/dL

## 2021-03-04 LAB — HEPATITIS B CORE ANTIBODY, TOTAL: Hep B Core Total Ab: NONREACTIVE

## 2021-03-04 LAB — BPAM RBC
Blood Product Expiration Date: 202210172359
Blood Product Expiration Date: 202210202359
Blood Product Expiration Date: 202210202359
ISSUE DATE / TIME: 202209222131
ISSUE DATE / TIME: 202209230118
ISSUE DATE / TIME: 202209231549
Unit Type and Rh: 5100
Unit Type and Rh: 5100
Unit Type and Rh: 5100

## 2021-03-04 LAB — GLUCOSE, CAPILLARY: Glucose-Capillary: 134 mg/dL — ABNORMAL HIGH (ref 70–99)

## 2021-03-04 LAB — VITAMIN B12: Vitamin B-12: 343 pg/mL (ref 180–914)

## 2021-03-04 LAB — HEPATITIS B SURFACE ANTIGEN: Hepatitis B Surface Ag: NONREACTIVE

## 2021-03-04 LAB — HEPATITIS B CORE ANTIBODY, IGM: Hep B C IgM: NONREACTIVE

## 2021-03-04 LAB — HEPATITIS B SURFACE ANTIBODY,QUALITATIVE: Hep B S Ab: NONREACTIVE

## 2021-03-04 SURGERY — IMAGING PROCEDURE, GI TRACT, INTRALUMINAL, VIA CAPSULE

## 2021-03-04 MED ORDER — ALTEPLASE 2 MG IJ SOLR: 2.0000 mg | Freq: Once | INTRAMUSCULAR | Status: DC | PRN

## 2021-03-04 MED ORDER — PENTAFLUOROPROP-TETRAFLUOROETH EX AERO: 1.0000 "application " | INHALATION_SPRAY | CUTANEOUS | Status: DC | PRN

## 2021-03-04 MED ORDER — LIDOCAINE-PRILOCAINE 2.5-2.5 % EX CREA: 1.0000 "application " | TOPICAL_CREAM | CUTANEOUS | Status: DC | PRN

## 2021-03-04 MED ORDER — SODIUM CHLORIDE 0.9 % IV SOLN: 100.0000 mL | INTRAVENOUS | Status: DC | PRN

## 2021-03-04 MED ORDER — LIDOCAINE HCL (PF) 1 % IJ SOLN: 5.0000 mL | INTRAMUSCULAR | Status: DC | PRN

## 2021-03-04 MED ORDER — HEPARIN SODIUM (PORCINE) 1000 UNIT/ML DIALYSIS
1000.0000 [IU] | INTRAMUSCULAR | Status: DC | PRN
  Filled 2021-03-04: qty 1

## 2021-03-04 NOTE — Progress Notes (Signed)
Pt ingested pill cam at 0855 with no issues. Instructions reviewed with pt and bedside RN. Instruction sheet left at bedside.

## 2021-03-04 NOTE — Progress Notes (Signed)
[facility] ROUNDING NOTE   Subjective:   Interval History: This is a 65 year old gentleman who is a history of end-stage renal disease Monday Wednesday Friday dialysis [facility].  He has a history of hypertension anemia of chronic disease and diastolic heart failure.  He was brought to the emergency room with increasing anemia.  He has been seen by GI in the past and has undergone EGD and colonoscopy in March which was unremarkable.  Surgical pathology was pertinent for H. pylori 08/2020.  Appreciate assistance of Dr. Carlean Purl  Blood pressure 180/79 pulse 70 temperature 97.8 O2 sats 98% room air.  Status post dialysis 03/04/2021.  This was after midnight due to high dialysis volumes.  His next dialysis treatment 03/06/2021  Sodium 139 potassium 4 chloride 106 CO2 23 BUN 53 creatinine 5.5 glucose 92 calcium 7.8 phosphorus 4.9 albumin 2.9 hemoglobin 7.9  Status post transfusion 03/03/2021  Objective:  Vital signs in last 24 hours:  Temp:  [97.6 F (36.4 C)-98.4 F (36.9 C)] 97.8 F (36.6 C) (09/24 0732) Pulse Rate:  [65-77] 67 (09/24 0737) Resp:  [14-21] 18 (09/24 0732) BP: (163-201)/(63-96) 182/79 (09/24 0737) SpO2:  [94 %-100 %] 98 % (09/24 0732) Weight:  [71.4 kg-74.3 kg] 71.4 kg (09/24 0710)  Weight change: -5.579 kg Filed Weights   03/03/21 2050 03/04/21 0330 03/04/21 0710  Weight: 74.3 kg 74.2 kg 71.4 kg    Intake/Output: I/O last 3 completed shifts: In: 2260.3 [I.V.:200.3; Blood:1960; IV Piggyback:100] Out: -    Intake/Output this shift:  Total I/O In: 0  Out: 2800 [Other:2800]  CVS- RRR RS- CTA ABD- BS present soft non-distended EXT- no edema   Basic Metabolic Panel: Recent Labs  Lab 03/02/21 1800 03/03/21 0544 03/04/21 0416  NA 139 139 139  K 4.1 3.4* 4.0  CL 105 108 106  CO2 '26 22 23  '$ GLUCOSE 76 93 92  BUN 42* 44* 53*  CREATININE 4.52* 4.48* 5.55*  CALCIUM 8.0* 7.2* 7.8*  PHOS  --  5.3* 4.9*    Liver Function Tests: Recent Labs   Lab 03/02/21 1800 03/03/21 0544 03/04/21 0416  AST 32  --   --   ALT 28  --   --   ALKPHOS 90  --   --   BILITOT 0.5  --   --   PROT 6.8  --   --   ALBUMIN 3.1* 2.7* 2.9*   No results for input(s): LIPASE, AMYLASE in the last 168 hours. No results for input(s): AMMONIA in the last 168 hours.  CBC: Recent Labs  Lab 03/02/21 1800 03/03/21 0544 03/04/21 0416  WBC 5.1 4.0 5.2  HGB 5.3* 6.8* 7.9*  HCT 16.7* 21.1* 24.0*  MCV 92.8 90.9 90.2  PLT 105* 88* 100*    Cardiac Enzymes: No results for input(s): CKTOTAL, CKMB, CKMBINDEX, TROPONINI in the last 168 hours.  BNP: Invalid input(s): POCBNP  CBG: Recent Labs  Lab 03/03/21 2047  GLUCAP 116*    Microbiology: Results for orders placed or performed during the hospital encounter of 03/02/21  Resp Panel by RT-PCR (Flu A&B, Covid) Nasopharyngeal Swab     Status: None   Collection Time: 03/02/21 10:55 PM   Specimen: Nasopharyngeal Swab; Nasopharyngeal(NP) swabs in vial transport medium  Result Value Ref Range Status   SARS Coronavirus 2 by RT PCR NEGATIVE NEGATIVE Final    Comment: (NOTE) SARS-CoV-2 target nucleic acids are NOT DETECTED.  The SARS-CoV-2 RNA is generally detectable in upper respiratory specimens during the acute  phase of infection. The lowest concentration of SARS-CoV-2 viral copies this assay can detect is 138 copies/mL. A negative result does not preclude SARS-Cov-2 infection and should not be used as the sole basis for treatment or other patient management decisions. A negative result may occur with  improper specimen collection/handling, submission of specimen other than nasopharyngeal swab, presence of viral mutation(s) within the areas targeted by this assay, and inadequate number of viral copies(<138 copies/mL). A negative result must be combined with clinical observations, patient history, and epidemiological information. The expected result is Negative.  Fact Sheet for Patients:   EntrepreneurPulse.com.au  Fact Sheet for Healthcare Providers:  IncredibleEmployment.be  This test is no t yet approved or cleared by the [place] FDA and  has been authorized for detection and/or diagnosis of SARS-CoV-2 by FDA under an Emergency Use Authorization (EUA). This EUA will remain  in effect (meaning this test can be used) for the duration of the COVID-19 declaration under Section 564(b)(1) of the Act, 21 U.S.C.section 360bbb-3(b)(1), unless the authorization is terminated  or revoked sooner.       Influenza A by PCR NEGATIVE NEGATIVE Final   Influenza B by PCR NEGATIVE NEGATIVE Final    Comment: (NOTE) The Xpert Xpress SARS-CoV-2/FLU/RSV plus assay is intended as an aid in the diagnosis of influenza from Nasopharyngeal swab specimens and should not be used as a sole basis for treatment. Nasal washings and aspirates are unacceptable for Xpert Xpress SARS-CoV-2/FLU/RSV testing.  Fact Sheet for Patients: EntrepreneurPulse.com.au  Fact Sheet for Healthcare Providers: IncredibleEmployment.be  This test is not yet approved or cleared by the [place] FDA and has been authorized for detection and/or diagnosis of SARS-CoV-2 by FDA under an Emergency Use Authorization (EUA). This EUA will remain in effect (meaning this test can be used) for the duration of the COVID-19 declaration under Section 564(b)(1) of the Act, 21 U.S.C. section 360bbb-3(b)(1), unless the authorization is terminated or revoked.  Performed at [facility], [place] [address] [place], [place] [address]     Coagulation Studies: Recent Labs    03/02/21 1546 03/03/21 0544  LABPROT 10.3 14.5  INR 1.0 1.1    Urinalysis: No results for input(s): COLORURINE, LABSPEC, PHURINE, GLUCOSEU, HGBUR, BILIRUBINUR, KETONESUR, PROTEINUR, UROBILINOGEN, NITRITE, LEUKOCYTESUR in the last 72 hours.  Invalid input(s): APPERANCEUR     Imaging: No results found.   Medications:    ferric gluconate (FERRLECIT) IVPB 250 mg (03/03/21 2108)    amLODipine  10 mg Oral Daily   carvedilol  12.5 mg Oral BID WC   Chlorhexidine Gluconate Cloth  6 each Topical Q0600   darbepoetin (ARANESP) injection - DIALYSIS  200 mcg Intravenous Q Fri-HD   enoxaparin (LOVENOX) injection  30 mg Subcutaneous Q24H   isosorbide mononitrate  60 mg Oral Daily   latanoprost  1 drop Left Eye QHS   pantoprazole (PROTONIX) IV  40 mg Intravenous Q24H     Assessment/ Plan:  ESRD-Monday Wednesday Friday dialysis patient.  Next dialysis 03/06/2021.  Avoiding use of heparin anticoagulation ANEMIA-work-up as per primary service.  Status post transfusion with improvement of hemoglobin   Acute blood loss anemia.  We will continue ESA MBD-continue to follow renal panel on daily basis.  Evaluate for binder use.  Secondary hyperparathyroidism treatment with vitamin D therapy. HTN/VOL-appears to be euvolemic at this time. ACCESS-has AV fistula but dialyzes through dialysis catheter.    LOS: 0 Sherril Croon '@TODAY''@9'$ :15 AM

## 2021-03-04 NOTE — Progress Notes (Signed)
HD#0 SUBJECTIVE:  Patient Summary: Jeffrey Zuniga is a 65 y.o. with a pertinent PMH of ESRD, HFrEF, hypertension, anemia of chronic disease, H pylori gastritis, who presented with anemia with hemoglobin less than 5 and admitted for symptomatic anemia.   Overnight Events: Patient received hemodialysis overnight. He received a third unit of PRBC with Hgb improvement to 7.9.  Interim History: Jeffrey Zuniga reports feeling generally well this morning though he is very tired from having HD overnight. He has no new headache, weakness, worsening of vision. He did have a dark BM however this was following bowel prep for capsule endoscopy scheduled for today.  OBJECTIVE:  Vital Signs: Vitals:   03/04/21 0700 03/04/21 0710 03/04/21 0732 03/04/21 0737  BP: (!) 197/94 (!) 187/92  (!) 182/79  Pulse: 72 72 70 67  Resp: '16 16 18   '$ Temp:  97.6 F (36.4 C) 97.8 F (36.6 C)   TempSrc:  Oral Oral   SpO2: 100% 100% 98%   Weight:  71.4 kg    Height:       Supplemental O2: Room Air SpO2: 98 %  Filed Weights   03/03/21 2050 03/04/21 0330 03/04/21 0710  Weight: 74.3 kg 74.2 kg 71.4 kg     Intake/Output Summary (Last 24 hours) at 03/04/2021 0743 Last data filed at 03/04/2021 0710 Gross per 24 hour  Intake 730.31 ml  Output 2800 ml  Net -2069.69 ml   Net IO Since Admission: -539.69 mL [03/04/21 0743]  Physical Exam: Constitutional: Patient is resting comfortably in bed. No acute distress noted. Cardio:Regular rate and rhythm. No murmurs, rubs, gallops. Pulm: Clear to auscultation bilaterally. Normal work of breathing on room air. Chest: R IJ tunneled catheter. Abdomen: Soft, non-tender, non-distended. MSK:No edema to bilateral LE. LLE with AV fistula with palpable thrill. Skin: Warm and dry. Neuro: No focal deficit noted. Alert and oriented x 3. Psych: Normal mood and affect.   ASSESSMENT/PLAN:  Assessment: Active Problems:   Symptomatic anemia   Plan: #Symptomatic anemia Patient has  received a total of 3 units PRBC since presenting to the ED with most recent Hgb of 7.9 (5.3 on admission). He endorses dark stools however this was following bowel prep for capsule endoscopy today. He does receive darbepoetin alfa 40 mcg IV and ferric gluconate 250 mg IV on dialysis days (Monday, Wednesday, Friday) at home he takes an oral iron supplement as well as Protonix.   -GI on board, appreciate their recommendations             -Patient will undergo capsule endoscopy today             -Right upper quadrant ultrasound pending -Trend CBC -Transfuse if hemoglobin less than 7 -Continue iron, darbepoetin alfa -Continue Protonix   #History of H. pylori gastritis H. pylori gastritis was diagnosed during EGD in 08/2020.  He was treated with triple therapy at that time and recommended to follow-up with capsule endoscopy however this has not taken place yet. -Capsule endoscopy today   #ESRD Patient was initiated on hemodialysis during most recent admission 8/30-9/7.  He undergoes hemodialysis on Mondays, Wednesdays, Fridays.  He underwent HD overnight 09/23. -Nephrology consulted, appreciate their recommendations             -Continue EPA             -Vitamin D for secondary hyperparathyroidism -Trend BMP   #Grade 2 diastolic dysfunction Echocardiogram 8/31 showed LVEF of 55 to 60% with normal function, severe LVH and elevated  LV end-diastolic pressure, consistent with grade 2 diastolic dysfunction.  Pulmonary artery systolic pressure was found to be severely elevated, estimated right ventricular systolic pressure of [id number] mmHg.  Left and right atria were both moderately dilated.  A small pericardial effusion was noted.  There was mild mitral valve and tricuspid valve regurgitation. -Continue Coreg 12.5 mg twice daily   #Hypertension Patient hypertensive during dialysis at 201/92. He denies symptoms of headache, visual disturbances. He is persistently hypertensive with recent induction of  medical management at last hospital admission 08/30-09/07. -Continue Coreg 12.5 mg twice daily -Continue amlodipine 10 mg daily -Continue Imdur 60 mg daily -Change vitals monitoring to q6h   #Thrombocytopenia Trending upward. 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  He has not started treatment with infectious disease yet however has been seen by their clinic as an outpatient.   #Type 2 diabetes mellitus HbA1c of 4.8%.   #Glaucoma Patient denies visual changes from previous admission at this time. He did not see an ophthalmologist after discharge. -Consider ophthalmology consult while inpatient -If not seen prior to discharge, arrange for him to be seen after this discharge -Continue home latanoprost  Best Practice: Diet: Renal diet with 1.2L fluid restriction IVF: Fluids: None VTE: enoxaparin (LOVENOX) injection 30 mg Start: 03/03/21 1200 Code: Full AB: None DISPO: Anticipated discharge in 1-3 days to Home pending Medical stability and capsule endoscopy completion/results review .  Signature: Farrel Gordon, D.O.  Internal Medicine Resident, PGY-1 [facility]  Pager: [phone] 7:43 AM, 03/04/2021   Please contact the on call pager after 5 pm and on weekends at [phone].

## 2021-03-04 NOTE — Progress Notes (Signed)
Patient off floor. Going to HD unit for dialysis

## 2021-03-04 NOTE — Evaluation (Addendum)
Physical Therapy Evaluation & Discharge Patient Details Name: Jeffrey Zuniga MRN: [id number] DOB: 09/04/1955 Today's Date: 03/04/2021  History of Present Illness  65 y/o male presented on 9/22 presented with anemia with Hgb less than 5. Received 3 units PRBC. Plan for EGD on 9/24. PMH: ESRD on HD, HFrEF, DM type 2, GI bleed, HTN, anemia.  Clinical Impression  PTA, patient lives alone and is independent. Patient functioning at independent level for mobility with no AD. Patient with mild balance deficits but able to self correct during mobility. No further skilled PT needs required acutely. No PT follow up recommended at this time.        Recommendations for follow up therapy are one component of a multi-disciplinary discharge planning process, led by the attending physician.  Recommendations may be updated based on patient status, additional functional criteria and insurance authorization.  Follow Up Recommendations No PT follow up    Equipment Recommendations  None recommended by PT    Recommendations for Other Services       Precautions / Restrictions Precautions Precautions: None Restrictions Weight Bearing Restrictions: No      Mobility  Bed Mobility Overal bed mobility: Independent                  Transfers Overall transfer level: Independent Equipment used: None                Ambulation/Gait Ambulation/Gait assistance: Independent Gait Distance (Feet): 200 Feet Assistive device: None Gait Pattern/deviations: Drifts right/left     General Gait Details: LOB x 1 but able to self correct independently  Stairs Stairs: Yes Stairs assistance: Independent Stair Management: One rail Right;Alternating pattern Number of Stairs: 10    Wheelchair Mobility    Modified Rankin (Stroke Patients Only)       Balance Overall balance assessment: Mild deficits observed, not formally tested                                            Pertinent Vitals/Pain Pain Assessment: No/denies pain    Home Living Family/patient expects to be discharged to:: Private residence Living Arrangements: Alone Available Help at Discharge: Family;Available PRN/intermittently Type of Home: Apartment Home Access: Stairs to enter Entrance Stairs-Rails: Right;Left Entrance Stairs-Number of Steps: flight Home Layout: One level Home Equipment: Cane - single point      Prior Function Level of Independence: Independent         Comments: recently retired. Uses SPC intermittently     Hand Dominance        Extremity/Trunk Assessment   Upper Extremity Assessment Upper Extremity Assessment: Overall WFL for tasks assessed    Lower Extremity Assessment Lower Extremity Assessment: Overall WFL for tasks assessed       Communication   Communication: No difficulties  Cognition Arousal/Alertness: Awake/alert Behavior During Therapy: WFL for tasks assessed/performed Overall Cognitive Status: Within Functional Limits for tasks assessed                                        General Comments      Exercises     Assessment/Plan    PT Assessment Patent does not need any further PT services  PT Problem List         PT Treatment Interventions  PT Goals (Current goals can be found in the Care Plan section)  Acute Rehab PT Goals Patient Stated Goal: to go home PT Goal Formulation: All assessment and education complete, DC therapy    Frequency     Barriers to discharge        Co-evaluation               AM-PAC PT "6 Clicks" Mobility  Outcome Measure Help needed turning from your back to your side while in a flat bed without using bedrails?: None Help needed moving from lying on your back to sitting on the side of a flat bed without using bedrails?: None Help needed moving to and from a bed to a chair (including a wheelchair)?: None Help needed standing up from a chair using your arms  (e.g., wheelchair or bedside chair)?: None Help needed to walk in hospital room?: None Help needed climbing 3-5 steps with a railing? : None 6 Click Score: 24    End of Session   Activity Tolerance: Patient tolerated treatment well Patient left: Other (comment) (on couch in room) Nurse Communication: Mobility status PT Visit Diagnosis: Unsteadiness on feet (R26.81)    Time: [id number] PT Time Calculation (min) (ACUTE ONLY): 18 min   Charges:   PT Evaluation $PT Eval Low Complexity: 1 Low          Shandrell Boda A. Gilford Rile PT, DPT Acute Rehabilitation Services Pager [phone] Office [phone]   Linna Hoff 03/04/2021, 9:04 AM

## 2021-03-05 DIAGNOSIS — D62 Acute posthemorrhagic anemia: Secondary | ICD-10-CM

## 2021-03-05 LAB — HEMOGLOBIN AND HEMATOCRIT, BLOOD
HCT: 25.5 % — ABNORMAL LOW (ref 39.0–52.0)
Hemoglobin: 8 g/dL — ABNORMAL LOW (ref 13.0–17.0)

## 2021-03-05 LAB — HEPATITIS B SURFACE ANTIGEN: Hepatitis B Surface Ag: NONREACTIVE

## 2021-03-05 MED ORDER — CHLORHEXIDINE GLUCONATE CLOTH 2 % EX PADS
6.0000 | MEDICATED_PAD | Freq: Every day | CUTANEOUS | Status: DC
  Administered 2021-03-05 – 2021-03-06 (×2): 6 via TOPICAL

## 2021-03-05 MED ORDER — SODIUM CHLORIDE 0.9 % IV SOLN
250.0000 mg | Freq: Every day | INTRAVENOUS | Status: AC
  Administered 2021-03-05 – 2021-03-06 (×2): 250 mg via INTRAVENOUS
  Filled 2021-03-05 (×2): qty 20

## 2021-03-05 MED ORDER — SODIUM CHLORIDE 0.9 % IV SOLN: INTRAVENOUS | Status: DC

## 2021-03-05 NOTE — Progress Notes (Signed)
   Patient Name: Jeffrey Zuniga Date of Encounter: 03/05/2021, 3:25 PM    Subjective  No complaints Capsule endoscopy yesterday showed fresh blood in the duodenum and small bowel AVMs  Objective  BP (!) 153/65 (BP Location: Right Arm)   Pulse 69   Temp 98.3 F (36.8 C) (Oral)   Resp 16   Ht '5\' 7"'$  (1.702 m)   Wt 71.4 kg   SpO2 99%   BMI 24.65 kg/m  Lungs clear Normal heart sounds  CBC Latest Ref Rng & Units 03/05/2021 03/04/2021 03/03/2021  WBC 4.0 - 10.5 K/uL - 5.2 4.0  Hemoglobin 13.0 - 17.0 g/dL 8.0(L) 7.9(L) 6.8(LL)  Hematocrit 39.0 - 52.0 % 25.5(L) 24.0(L) 21.1(L)  Platelets 150 - 400 K/uL - 100(L) 88(L)       Assessment and Plan  Angiodysplasia of small intestine presumably source of bleed Acute on chronic anemia some of this is acute blood loss End-stage renal disease on hemodialysis  The patient has been scheduled for an enteroscopy tomorrow and will be performed by Dr. Hilarie Fredrickson.  Hopefully we can do this early in the morning.  We have availability I think but we will have to coordinate around dialysis.  Gatha Mayer, MD, [facility] 03/05/2021 3:25 PM

## 2021-03-05 NOTE — Progress Notes (Signed)
HD#1 SUBJECTIVE:  Patient Summary: Jeffrey Zuniga is a 65 y.o. with a pertinent PMH of ESRD, HFrEF, hypertension, anemia of chronic disease, H pylori gastritis, who presented with anemia with hemoglobin less than 5 and admitted for symptomatic anemia.   Overnight Events: None   Interim History: Jeffrey Zuniga reports feeling okay this AM. The ringing sensation that he was experiencing on presentation has resolved since he received blood. He has not passed the capsule yet.   OBJECTIVE:  Vital Signs: Vitals:   03/04/21 2042 03/05/21 0645 03/05/21 0926 03/05/21 0929  BP: (!) 147/75 (!) 173/55 (!) 153/65 (!) 153/65  Pulse: 62 65 69 69  Resp: '18 20  16  '$ Temp: 98 F (36.7 C) 98.3 F (36.8 C)  98.3 F (36.8 C)  TempSrc: Oral   Oral  SpO2: 99% 99%  99%  Weight:      Height:       Supplemental O2: Room Air SpO2: 99 %  Filed Weights   03/04/21 0330 03/04/21 0710 03/04/21 0952  Weight: 74.2 kg 71.4 kg 71.4 kg     Intake/Output Summary (Last 24 hours) at 03/05/2021 1223 Last data filed at 03/05/2021 0900 Gross per 24 hour  Intake 630 ml  Output 250 ml  Net 380 ml    Net IO Since Admission: 111.28 mL [03/05/21 1223]  Physical Exam: Constitutional: Patient is resting comfortably in bed. No acute distress noted. Cardio:Regular rate and rhythm. No murmurs, rubs, gallops. Pulm: Clear to auscultation bilaterally. Normal work of breathing on room air. Chest: R IJ tunneled catheter surrounding skin non erythematous, no drainage  Abdomen: Soft, non-tender, non-distended. MSK:No edema to bilateral LE. LUE with AV fistula with palpable thrill. Skin: Warm and dry. Neuro: No focal deficit noted. Alert and oriented x 3. Psych: Normal mood and affect.   ASSESSMENT/PLAN:  Assessment: Active Problems:   Symptomatic anemia   Plan: #Symptomatic anemia Patient has received a total of 3 units PRBC since presenting to the ED Hgb is stable this AM. He endorses dark stools prior to ingesting  capsule for VCE however this was following bowel prep. No reported dark stools since then. He does receive darbepoetin alfa 40 mcg IV and ferric gluconate 250 mg IV on dialysis days (Monday, Wednesday, Friday) at home he takes an oral iron supplement as well as Protonix.  -GI on board, appreciate their recommendations             -Patient has not passed capsule yet, will await read.  -Trend CBC -Transfuse if hemoglobin less than 7 -Continue iron, darbepoetin alfa -Continue Protonix   #History of H. pylori gastritis H. pylori gastritis was diagnosed during EGD in 08/2020.  He was treated with triple therapy at that time and recommended to follow-up with capsule endoscopy however this has not taken place yet. -Capsule endoscopy has not passed.    #ESRD Patient was initiated on hemodialysis during most recent admission 8/30-9/7.  He undergoes hemodialysis on Mondays, Wednesdays, Fridays.  He underwent HD overnight 09/23. -Nephrology consulted, appreciate their recommendations             -Continue EPA             -Vitamin D for secondary hyperparathyroidism -Trend BMP   #Grade 2 diastolic dysfunction Echocardiogram 8/31 showed LVEF of 55 to 60% with normal function, severe LVH and elevated LV end-diastolic pressure, consistent with grade 2 diastolic dysfunction.  Pulmonary artery systolic pressure was found to be severely elevated, estimated right ventricular systolic  pressure of 60.4 mmHg.  Left and right atria were both moderately dilated.  A small pericardial effusion was noted.  There was mild mitral valve and tricuspid valve regurgitation. -Continue Coreg 12.5 mg twice daily   #Hypertension Patient hypertensive during dialysis at 201/92. He denies symptoms of headache, visual disturbances. He is persistently hypertensive with recent induction of medical management at last hospital admission 08/30-09/07. His blood pressure is improved this AM s/p HD. In the 150s. Will continue to monitor and  will consider adjusting medications while inpatient v waiting until patient follows up with his PCP 9/27.  -Continue Coreg 12.5 mg twice daily -Continue amlodipine 10 mg daily -Continue Imdur 60 mg daily -Change vitals monitoring to q6h   #Thrombocytopenia Trending upward. 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  He has not started treatment with infectious disease yet however has been seen by their clinic as an outpatient.   #Type 2 diabetes mellitus HbA1c of 4.8%.   #Glaucoma Patient denies visual changes from previous admission at this time. He did not see an ophthalmologist after discharge. -Consider ophthalmology consult while inpatient -If not seen prior to discharge, arrange for him to be seen after this discharge -Continue home latanoprost  Best Practice: Diet: Renal diet with 1.2L fluid restriction IVF: Fluids: None VTE: enoxaparin (LOVENOX) injection 30 mg Start: 03/03/21 1200 Code: Full AB: None DISPO: Anticipated discharge in 1-3 days to Home pending Medical stability and capsule endoscopy completion/results review .  Signature: Rick Duff, MD PGY-2 Internal Medicine  Pager [phone]  12:23 PM, 03/05/2021   Please contact the on call pager after 5 pm and on weekends at [phone].

## 2021-03-05 NOTE — Procedures (Signed)
Indication:  Heme positive decreased hemoglobin  Recent studies:  EGD colonoscopy March 2022 gastritis treated H. pylori small polyp in the colon and internal hemorrhoids   Findings:  Complete capsule study adequate prep  Small amount of blood in the early duodenum and about 20 minutes and then an AVM at 21 minutes and another AVM at 30 minutes both AVMs without evidence of current bleeding  Recommendations and plan:  Enteroscopy tomorrow by Dr. Karren Cobble, MD, [facility] 03/05/2021 3:00 PM

## 2021-03-05 NOTE — Plan of Care (Signed)
  Problem: Health Behavior/Discharge Planning: Goal: Ability to manage health-related needs will improve Outcome: Progressing   Problem: Activity: Goal: Risk for activity intolerance will decrease Outcome: Progressing   

## 2021-03-05 NOTE — H&P (View-Only) (Signed)
   Patient Name: Jeffrey Zuniga Date of Encounter: 03/05/2021, 3:25 PM    Subjective  No complaints Capsule endoscopy yesterday showed fresh blood in the duodenum and small bowel AVMs  Objective  BP (!) 153/65 (BP Location: Right Arm)   Pulse 69   Temp 98.3 F (36.8 C) (Oral)   Resp 16   Ht '5\' 7"'$  (1.702 m)   Wt 71.4 kg   SpO2 99%   BMI 24.65 kg/m  Lungs clear Normal heart sounds  CBC Latest Ref Rng & Units 03/05/2021 03/04/2021 03/03/2021  WBC 4.0 - 10.5 K/uL - 5.2 4.0  Hemoglobin 13.0 - 17.0 g/dL 8.0(L) 7.9(L) 6.8(LL)  Hematocrit 39.0 - 52.0 % 25.5(L) 24.0(L) 21.1(L)  Platelets 150 - 400 K/uL - 100(L) 88(L)       Assessment and Plan  Angiodysplasia of small intestine presumably source of bleed Acute on chronic anemia some of this is acute blood loss End-stage renal disease on hemodialysis  The patient has been scheduled for an enteroscopy tomorrow and will be performed by Dr. Hilarie Fredrickson.  Hopefully we can do this early in the morning.  We have availability I think but we will have to coordinate around dialysis.  Gatha Mayer, MD, [facility] 03/05/2021 3:25 PM

## 2021-03-05 NOTE — Plan of Care (Signed)
  Problem: Activity: Goal: Risk for activity intolerance will decrease Outcome: Progressing   

## 2021-03-05 NOTE — Progress Notes (Signed)
[facility] ROUNDING NOTE   Subjective:   Interval History: This is a 65 year old gentleman who is a history of end-stage renal disease Monday Wednesday Friday dialysis [facility].  He has a history of hypertension anemia of chronic disease and diastolic heart failure.  He was brought to the emergency room with increasing anemia.  He has been seen by GI in the past and has undergone EGD and colonoscopy in March which was unremarkable.  Surgical pathology was pertinent for H. pylori 08/2020.  Appreciate assistance of Dr. Carlean Purl  Blood pressure 153/65 pulse 69 temperature 98.3 O2 sats 99% room air.  Status post dialysis 03/04/2021 with 2.8 L removed.  This was after midnight due to high dialysis volumes.  His next dialysis treatment 03/06/2021  Sodium 139 potassium 4 chloride 106 CO2 23 BUN 53 creatinine 5.5 glucose 92 calcium 7.8 phosphorus 4.9 albumin 2.9 hemoglobin 7.9  Status post transfusion 03/03/2021  Objective:  Vital signs in last 24 hours:  Temp:  [97.9 F (36.6 C)-98.3 F (36.8 C)] 98.3 F (36.8 C) (09/25 0929) Pulse Rate:  [62-71] 69 (09/25 0929) Resp:  [16-20] 16 (09/25 0929) BP: (147-194)/(55-83) 153/65 (09/25 0929) SpO2:  [98 %-99 %] 99 % (09/25 0929) Weight:  [71.4 kg] 71.4 kg (09/24 0952)  Weight change: -2.854 kg Filed Weights   03/04/21 0330 03/04/21 0710 03/04/21 0952  Weight: 74.2 kg 71.4 kg 71.4 kg    Intake/Output: I/O last 3 completed shifts: In: [id number] [P.O.:120; IV Piggyback:541] Out: 2800 [Other:2800]   Intake/Output this shift:  Total I/O In: 240 [P.O.:240] Out: 250 [Urine:250]  CVS- RRR RS- CTA ABD- BS present soft non-distended EXT- no edema   Basic Metabolic Panel: Recent Labs  Lab 03/02/21 1800 03/03/21 0544 03/04/21 0416  NA 139 139 139  K 4.1 3.4* 4.0  CL 105 108 106  CO2 '26 22 23  '$ GLUCOSE 76 93 92  BUN 42* 44* 53*  CREATININE 4.52* 4.48* 5.55*  CALCIUM 8.0* 7.2* 7.8*  PHOS  --  5.3* 4.9*     Liver Function  Tests: Recent Labs  Lab 03/02/21 1800 03/03/21 0544 03/04/21 0416  AST 32  --   --   ALT 28  --   --   ALKPHOS 90  --   --   BILITOT 0.5  --   --   PROT 6.8  --   --   ALBUMIN 3.1* 2.7* 2.9*    No results for input(s): LIPASE, AMYLASE in the last 168 hours. No results for input(s): AMMONIA in the last 168 hours.  CBC: Recent Labs  Lab 03/02/21 1800 03/03/21 0544 03/04/21 0416  WBC 5.1 4.0 5.2  HGB 5.3* 6.8* 7.9*  HCT 16.7* 21.1* 24.0*  MCV 92.8 90.9 90.2  PLT 105* 88* 100*     Cardiac Enzymes: No results for input(s): CKTOTAL, CKMB, CKMBINDEX, TROPONINI in the last 168 hours.  BNP: Invalid input(s): POCBNP  CBG: Recent Labs  Lab 03/03/21 2047 03/04/21 1639  GLUCAP 116* 134*     Microbiology: Results for orders placed or performed during the hospital encounter of 03/02/21  Resp Panel by RT-PCR (Flu A&B, Covid) Nasopharyngeal Swab     Status: None   Collection Time: 03/02/21 10:55 PM   Specimen: Nasopharyngeal Swab; Nasopharyngeal(NP) swabs in vial transport medium  Result Value Ref Range Status   SARS Coronavirus 2 by RT PCR NEGATIVE NEGATIVE Final    Comment: (NOTE) SARS-CoV-2 target nucleic acids are NOT DETECTED.  The SARS-CoV-2 RNA is  generally detectable in upper respiratory specimens during the acute phase of infection. The lowest concentration of SARS-CoV-2 viral copies this assay can detect is 138 copies/mL. A negative result does not preclude SARS-Cov-2 infection and should not be used as the sole basis for treatment or other patient management decisions. A negative result may occur with  improper specimen collection/handling, submission of specimen other than nasopharyngeal swab, presence of viral mutation(s) within the areas targeted by this assay, and inadequate number of viral copies(<138 copies/mL). A negative result must be combined with clinical observations, patient history, and epidemiological information. The expected result is  Negative.  Fact Sheet for Patients:  EntrepreneurPulse.com.au  Fact Sheet for Healthcare Providers:  IncredibleEmployment.be  This test is no t yet approved or cleared by the [place] FDA and  has been authorized for detection and/or diagnosis of SARS-CoV-2 by FDA under an Emergency Use Authorization (EUA). This EUA will remain  in effect (meaning this test can be used) for the duration of the COVID-19 declaration under Section 564(b)(1) of the Act, 21 U.S.C.section 360bbb-3(b)(1), unless the authorization is terminated  or revoked sooner.       Influenza A by PCR NEGATIVE NEGATIVE Final   Influenza B by PCR NEGATIVE NEGATIVE Final    Comment: (NOTE) The Xpert Xpress SARS-CoV-2/FLU/RSV plus assay is intended as an aid in the diagnosis of influenza from Nasopharyngeal swab specimens and should not be used as a sole basis for treatment. Nasal washings and aspirates are unacceptable for Xpert Xpress SARS-CoV-2/FLU/RSV testing.  Fact Sheet for Patients: EntrepreneurPulse.com.au  Fact Sheet for Healthcare Providers: IncredibleEmployment.be  This test is not yet approved or cleared by the [place] FDA and has been authorized for detection and/or diagnosis of SARS-CoV-2 by FDA under an Emergency Use Authorization (EUA). This EUA will remain in effect (meaning this test can be used) for the duration of the COVID-19 declaration under Section 564(b)(1) of the Act, 21 U.S.C. section 360bbb-3(b)(1), unless the authorization is terminated or revoked.  Performed at [facility], [place] [address] [place], [place] [address]     Coagulation Studies: Recent Labs    03/02/21 1546 03/03/21 0544  LABPROT 10.3 14.5  INR 1.0 1.1     Urinalysis: No results for input(s): COLORURINE, LABSPEC, PHURINE, GLUCOSEU, HGBUR, BILIRUBINUR, KETONESUR, PROTEINUR, UROBILINOGEN, NITRITE, LEUKOCYTESUR in the last 72  hours.  Invalid input(s): APPERANCEUR    Imaging: No results found.   Medications:    ferric gluconate (FERRLECIT) IVPB      amLODipine  10 mg Oral Daily   carvedilol  12.5 mg Oral BID WC   Chlorhexidine Gluconate Cloth  6 each Topical Q0600   darbepoetin (ARANESP) injection - DIALYSIS  200 mcg Intravenous Q Fri-HD   enoxaparin (LOVENOX) injection  30 mg Subcutaneous Q24H   isosorbide mononitrate  60 mg Oral Daily   latanoprost  1 drop Left Eye QHS   pantoprazole (PROTONIX) IV  40 mg Intravenous Q24H     Assessment/ Plan:  ESRD-Monday Wednesday Friday dialysis patient.  Next dialysis 03/06/2021.  Avoiding use of heparin anticoagulation ANEMIA-work-up as per primary service.  Status post transfusion with improvement of hemoglobin   Acute blood loss anemia.  We will continue ESA MBD-continue to follow renal panel on daily basis.  Evaluate for binder use.  Secondary hyperparathyroidism treatment with vitamin D therapy. HTN/VOL-appears to be euvolemic at this time. ACCESS-has AV fistula but dialyzes through dialysis catheter.    LOS: [place] '@TODAY''@9'$ :31 AM

## 2021-03-06 ENCOUNTER — Encounter (HOSPITAL_COMMUNITY): Payer: Self-pay | Admitting: Internal Medicine

## 2021-03-06 ENCOUNTER — Inpatient Hospital Stay (HOSPITAL_COMMUNITY): Payer: 59 | Admitting: Certified Registered Nurse Anesthetist

## 2021-03-06 ENCOUNTER — Encounter (HOSPITAL_COMMUNITY)
Admission: EM | Disposition: A | Discharge status: Home / Self Care | Payer: Self-pay | Source: Home / Self Care | Attending: Internal Medicine

## 2021-03-06 ENCOUNTER — Telehealth: Payer: Self-pay

## 2021-03-06 ENCOUNTER — Other Ambulatory Visit (HOSPITAL_COMMUNITY): Payer: Self-pay

## 2021-03-06 DIAGNOSIS — K922 Gastrointestinal hemorrhage, unspecified: Secondary | ICD-10-CM

## 2021-03-06 DIAGNOSIS — K2970 Gastritis, unspecified, without bleeding: Secondary | ICD-10-CM

## 2021-03-06 DIAGNOSIS — K558 Other vascular disorders of intestine: Secondary | ICD-10-CM

## 2021-03-06 DIAGNOSIS — D649 Anemia, unspecified: Secondary | ICD-10-CM | POA: Diagnosis not present

## 2021-03-06 DIAGNOSIS — K2900 Acute gastritis without bleeding: Secondary | ICD-10-CM

## 2021-03-06 DIAGNOSIS — K31811 Angiodysplasia of stomach and duodenum with bleeding: Principal | ICD-10-CM

## 2021-03-06 DIAGNOSIS — K5520 Angiodysplasia of colon without hemorrhage: Secondary | ICD-10-CM

## 2021-03-06 DIAGNOSIS — R933 Abnormal findings on diagnostic imaging of other parts of digestive tract: Secondary | ICD-10-CM

## 2021-03-06 HISTORY — PX: ENTEROSCOPY: SHX5533

## 2021-03-06 HISTORY — PX: HEMOSTASIS CLIP PLACEMENT: SHX6857

## 2021-03-06 HISTORY — PX: HOT HEMOSTASIS: SHX5433

## 2021-03-06 HISTORY — PX: BIOPSY: SHX5522

## 2021-03-06 LAB — POCT I-STAT, CHEM 8
BUN: 44 mg/dL — ABNORMAL HIGH (ref 8–23)
Calcium, Ion: 1.08 mmol/L — ABNORMAL LOW (ref 1.15–1.40)
Chloride: 104 mmol/L (ref 98–111)
Creatinine, Ser: 6.4 mg/dL — ABNORMAL HIGH (ref 0.61–1.24)
Glucose, Bld: 102 mg/dL — ABNORMAL HIGH (ref 70–99)
HCT: 27 % — ABNORMAL LOW (ref 39.0–52.0)
Hemoglobin: 9.2 g/dL — ABNORMAL LOW (ref 13.0–17.0)
Potassium: 4.3 mmol/L (ref 3.5–5.1)
Sodium: 139 mmol/L (ref 135–145)
TCO2: 24 mmol/L (ref 22–32)

## 2021-03-06 LAB — CBC
HCT: 23.9 % — ABNORMAL LOW (ref 39.0–52.0)
Hemoglobin: 7.7 g/dL — ABNORMAL LOW (ref 13.0–17.0)
MCH: 29.2 pg (ref 26.0–34.0)
MCHC: 32.2 g/dL (ref 30.0–36.0)
MCV: 90.5 fL (ref 80.0–100.0)
Platelets: 88 10*3/uL — ABNORMAL LOW (ref 150–400)
RBC: 2.64 MIL/uL — ABNORMAL LOW (ref 4.22–5.81)
RDW: 20.6 % — ABNORMAL HIGH (ref 11.5–15.5)
WBC: 4.8 10*3/uL (ref 4.0–10.5)
nRBC: 0 % (ref 0.0–0.2)

## 2021-03-06 SURGERY — ENTEROSCOPY
Anesthesia: Monitor Anesthesia Care

## 2021-03-06 MED ORDER — AMLODIPINE BESYLATE 10 MG PO TABS
10.0000 mg | ORAL_TABLET | Freq: Every day | ORAL | 0 refills | Status: DC
  Filled 2021-03-06: qty 30, 30d supply, fill #0

## 2021-03-06 MED ORDER — LIDOCAINE 2% (20 MG/ML) 5 ML SYRINGE
INTRAMUSCULAR | Status: DC | PRN
  Administered 2021-03-06: 80 mg via INTRAVENOUS

## 2021-03-06 MED ORDER — DARBEPOETIN ALFA 200 MCG/0.4ML IJ SOSY
200.0000 ug | PREFILLED_SYRINGE | INTRAMUSCULAR | Status: DC
  Administered 2021-03-06: 200 ug via INTRAVENOUS
  Filled 2021-03-06: qty 0.4

## 2021-03-06 MED ORDER — DARBEPOETIN ALFA 200 MCG/0.4ML IJ SOSY: 200.0000 ug | PREFILLED_SYRINGE | INTRAMUSCULAR | Status: DC

## 2021-03-06 MED ORDER — LATANOPROST 0.005 % OP SOLN
1.0000 [drp] | Freq: Every day | OPHTHALMIC | 12 refills | Status: AC
  Filled 2021-03-06: qty 2.5, 30d supply, fill #0

## 2021-03-06 MED ORDER — DARBEPOETIN ALFA 200 MCG/0.4ML IJ SOSY
200.0000 ug | PREFILLED_SYRINGE | INTRAMUSCULAR | 1 refills | Status: AC
  Filled 2021-03-06: qty 1.68, 28d supply, fill #0

## 2021-03-06 MED ORDER — PANTOPRAZOLE SODIUM 40 MG PO TBEC: 40.0000 mg | DELAYED_RELEASE_TABLET | Freq: Two times a day (BID) | ORAL | Status: DC

## 2021-03-06 MED ORDER — PROPOFOL 500 MG/50ML IV EMUL
INTRAVENOUS | Status: DC | PRN
  Administered 2021-03-06: 150 ug/kg/min via INTRAVENOUS

## 2021-03-06 MED ORDER — CARVEDILOL 12.5 MG PO TABS
12.5000 mg | ORAL_TABLET | Freq: Two times a day (BID) | ORAL | 0 refills | Status: DC
Start: 2021-03-06 — End: 2021-03-28
  Filled 2021-03-06: qty 60, 30d supply, fill #0

## 2021-03-06 MED ORDER — HEPARIN SODIUM (PORCINE) 1000 UNIT/ML IJ SOLN
INTRAMUSCULAR | Status: AC
  Filled 2021-03-06: qty 4

## 2021-03-06 MED ORDER — FERROUS SULFATE 325 (65 FE) MG PO TABS
325.0000 mg | ORAL_TABLET | Freq: Every day | ORAL | 3 refills | Status: DC
  Filled 2021-03-06: qty 30, 30d supply, fill #0

## 2021-03-06 MED ORDER — PANTOPRAZOLE SODIUM 40 MG PO TBEC
40.0000 mg | DELAYED_RELEASE_TABLET | Freq: Every day | ORAL | 0 refills | Status: DC
  Filled 2021-03-06: qty 30, 30d supply, fill #0

## 2021-03-06 MED ORDER — ISOSORBIDE MONONITRATE ER 60 MG PO TB24
60.0000 mg | ORAL_TABLET | Freq: Every day | ORAL | 2 refills | Status: DC
  Filled 2021-03-06: qty 30, 30d supply, fill #0

## 2021-03-06 MED ORDER — GLUCAGON HCL RDNA (DIAGNOSTIC) 1 MG IJ SOLR
INTRAMUSCULAR | Status: DC | PRN
  Administered 2021-03-06: .5 mg via INTRAVENOUS

## 2021-03-06 MED ORDER — PROPOFOL 10 MG/ML IV BOLUS
INTRAVENOUS | Status: DC | PRN
  Administered 2021-03-06: 30 mg via INTRAVENOUS
  Administered 2021-03-06 (×2): 20 mg via INTRAVENOUS

## 2021-03-06 NOTE — Anesthesia Preprocedure Evaluation (Addendum)
Anesthesia Evaluation  Patient identified by MRN, date of birth, ID band Patient awake    Reviewed: Allergy & Precautions, NPO status , Patient's Chart, lab work & pertinent test results  Airway Mallampati: II  TM Distance: >3 FB     Dental  (+) Missing, Poor Dentition, Upper Dentures   Pulmonary Current Smoker and Patient abstained from smoking.,    Pulmonary exam normal breath sounds clear to auscultation       Cardiovascular hypertension, Pt. on medications +CHF  Normal cardiovascular exam Rhythm:Regular Rate:Normal     Neuro/Psych negative neurological ROS  negative psych ROS   GI/Hepatic (+) Hepatitis -, CHistory noted Dr. Nyoka Cowden   Endo/Other  diabetes, Well Controlled, Type 2  Renal/GU DialysisRenal diseaseLast dialysis 9/24  negative genitourinary   Musculoskeletal negative musculoskeletal ROS (+)   Abdominal   Peds  Hematology  (+) anemia ,   Anesthesia Other Findings   Reproductive/Obstetrics                            Anesthesia Physical Anesthesia Plan  ASA: 4  Anesthesia Plan: MAC   Post-op Pain Management:    Induction: Intravenous  PONV Risk Score and Plan: 1 and Ondansetron, Propofol infusion and Treatment may vary due to age or medical condition  Airway Management Planned: Natural Airway and Simple Face Mask  Additional Equipment:   Intra-op Plan:   Post-operative Plan:   Informed Consent: I have reviewed the patients History and Physical, chart, labs and discussed the procedure including the risks, benefits and alternatives for the proposed anesthesia with the patient or authorized representative who has indicated his/her understanding and acceptance.     Dental advisory given  Plan Discussed with: CRNA and Anesthesiologist  Anesthesia Plan Comments:        Anesthesia Quick Evaluation

## 2021-03-06 NOTE — Interval H&P Note (Signed)
History and Physical Interval Note: For SBE today to eval acute on chronic anemia and + VCE showing angioectasias in prox small bowel The nature of the procedure, as well as the risks, benefits, and alternatives were carefully and thoroughly reviewed with the patient. Ample time for discussion and questions allowed. The patient understood, was satisfied, and agreed to proceed.  Anticipated HD later today  CBC Latest Ref Rng & Units 03/06/2021 03/06/2021 03/05/2021  WBC 4.0 - 10.5 K/uL - 4.8 -  Hemoglobin 13.0 - 17.0 g/dL 9.2(L) 7.7(L) 8.0(L)  Hematocrit 39.0 - 52.0 % 27.0(L) 23.9(L) 25.5(L)  Platelets 150 - 400 K/uL - 88(L) -   Lab Results  Component Value Date   INR 1.1 03/03/2021   INR 1.0 03/02/2021   INR 1.3 (H) 02/08/2021     03/06/2021 8:31 AM  Jeffrey Zuniga  has presented today for surgery, with the diagnosis of Small bowel  AVM's.  The various methods of treatment have been discussed with the patient and family. After consideration of risks, benefits and other options for treatment, the patient has consented to  Procedure(s): ENTEROSCOPY (N/A) as a surgical intervention.  The patient's history has been reviewed, patient examined, no change in status, stable for surgery.  I have reviewed the patient's chart and labs.  Questions were answered to the patient's satisfaction.     Lajuan Lines Jonesha Tsuchiya

## 2021-03-06 NOTE — TOC Transition Note (Signed)
Transition of Care [facility]) - CM/SW Discharge Note   Patient Details  Name: Jeffrey Zuniga MRN: [id number] Date of Birth: April 14, 1956  [facility]) CM/SW Contact:  Tom-Johnson, Renea Ee, RN Phone Number: 03/06/2021, 2:05 PM   Clinical Narrative:    CM spoke with patient at bedside. Patient recently discharged from this hospital two weeks ago. Patient lives alone and have four children whom are supportive. Daughter checks up on him and will be transporting at discharge. Transportation to and from dialysis was set up last admit with [facility] on his MWF dialysis days. States he finally retired this past Thursday 03/02/21 from his [place] job at [employer]. Kasandra Knudsen was ordered and delivered to patient on last admit as well. Patient is waiting to get dialysis here in the hospital today before he goes home. Denies any needs. No further TOC needs at this time.   Final next level of care: Home/Self Care Barriers to Discharge: No Barriers Identified   Patient Goals and CMS Choice Patient states their goals for this hospitalization and ongoing recovery are:: To go home CMS Medicare.gov Compare Post Acute Care list provided to:: Patient Choice offered to / list presented to : NA  Discharge Placement                       Discharge Plan and Services                DME Arranged: N/A DME Agency: NA       HH Arranged: NA HH Agency: NA        Social Determinants of Health (SDOH) Interventions     Readmission Risk Interventions No flowsheet data found.

## 2021-03-06 NOTE — Anesthesia Postprocedure Evaluation (Signed)
Anesthesia Post Note  Patient: Jeffrey Zuniga  Procedure(s) Performed: ENTEROSCOPY HEMOSTASIS CLIP PLACEMENT HOT HEMOSTASIS (ARGON PLASMA COAGULATION/BICAP) BIOPSY     Patient location during evaluation: Endoscopy Anesthesia Type: MAC Level of consciousness: awake Pain management: pain level controlled Vital Signs Assessment: post-procedure vital signs reviewed and stable Respiratory status: spontaneous breathing Cardiovascular status: stable Postop Assessment: no apparent nausea or vomiting Anesthetic complications: no   No notable events documented.  Last Vitals:  Vitals:   03/06/21 0920 03/06/21 0930  BP: (!) 167/47 (!) 175/71  Pulse: 73 66  Resp: 17 18  Temp:    SpO2: 100% 100%    Last Pain:  Vitals:   03/06/21 0930  TempSrc:   PainSc: 0-No pain                 Sharonlee Nine

## 2021-03-06 NOTE — Progress Notes (Signed)
Jeffrey Zuniga Progress Note   65 year old gentleman who is a history of end-stage renal disease Monday Wednesday Friday dialysis [place].  He has a history of hypertension anemia of chronic disease and diastolic heart failure.  He was brought to the emergency room with increasing anemia.  He has been seen by GI in the past and has undergone EGD and colonoscopy in March which was unremarkable.  Surgical pathology was pertinent for H. pylori 08/2020.  Appreciate assistance of Dr. Carlean Purl. Status post transfusion 03/03/2021  Assessment/ Plan:   #Symptomatic anemia Patient has received a total of 3 units PRBC since presenting to the ED Hgb is stable this AM. He endorses dark stools prior to ingesting capsule for VCE however this was following bowel prep. No reported dark stools since then. He does receive darbepoetin alfa 40 mcg IV and ferric gluconate 250 mg IV on dialysis days (Monday, Wednesday, Friday) at home he takes an oral iron supplement as well as Protonix.  -GI on board, appreciate their recommendations             -Patient has not passed capsule yet, will await read. Small bowel endoscopy on 9/26 by Dr. Hilarie Fredrickson. -Trend CBC -Transfuse if hemoglobin less than 7 -Continue iron, darbepoetin alfa -Continue Protonix   #History of H. pylori gastritis H. pylori gastritis was diagnosed during EGD in 08/2020.  He was treated with triple therapy at that time and recommended to follow-up with capsule endoscopy however this has not taken place yet. -Capsule endoscopy has not passed.    #ESRD Patient was initiated on hemodialysis during most recent admission 8/30-9/7.  He undergoes hemodialysis on Mondays, Wednesdays, Fridays.  He underwent HD overnight 09/23. -Nephrology consulted, appreciate their recommendations             -Continue EPA             -Vitamin D for secondary hyperparathyroidism  Seen on HD 2K bath through Lt BCF 130/63 Net UF goal 3 L as tolerated.   #Grade 2  diastolic dysfunction Echocardiogram 8/31 showed LVEF of 55 to 60% with normal function, severe LVH and elevated LV end-diastolic pressure, consistent with grade 2 diastolic dysfunction.  Pulmonary artery systolic pressure was found to be severely elevated, estimated right ventricular systolic pressure of [id number] mmHg.  Left and right atria were both moderately dilated.  A small pericardial effusion was noted.  There was mild mitral valve and tricuspid valve regurgitation. -Continue Coreg 12.5 mg twice daily   #Hypertension Patient hypertensive during dialysis at 201/92. He denies symptoms of headache, visual disturbances. He is persistently hypertensive with recent induction of medical management at last hospital admission 08/30-09/07. His blood pressure is improved this AM s/p HD. In the 150s. Will continue to monitor and will consider adjusting medications while inpatient v waiting until patient follows up with his PCP 9/27.  -Continue Coreg 12.5 mg twice daily -Continue amlodipine 10 mg daily -Continue Imdur 60 mg daily -Change vitals monitoring to q6h    #Thrombocytopenia Trending upward. 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  He has not started treatment with infectious disease yet however has been seen by their clinic as an outpatient.   #Type 2 diabetes mellitus HbA1c of 4.8%.   #Glaucoma Patient denies visual changes from previous admission at this time. He did not see an ophthalmologist after discharge. -Consider ophthalmology consult while inpatient -If not seen prior to discharge, arrange for him  to be seen after this discharge -Continue home latanoprost  Subjective:   Feeling better and asking when he's going home. Denies f/c/n/v/ SOB>   Objective:   BP (!) 175/71   Pulse 66   Temp 98.2 F (36.8 C) (Temporal)   Resp 18   Ht '5\' 7"'$  (1.702 m)   Wt 71.4 kg   SpO2 100%   BMI 24.65 kg/m   Intake/Output Summary  (Last 24 hours) at 03/06/2021 0944 Last data filed at 03/06/2021 0858 Gross per 24 hour  Intake 690 ml  Output 15 ml  Net 675 ml   Weight change:   Physical Exam: GEN: NAD, A&Ox3, NCAT HEENT: No conjunctival pallor, EOMI NECK: Supple, no thyromegaly LUNGS: CTA B/L no rales, rhonchi or wheezing CV: RRR, No M/R/G ABD: SNDNT +BS  EXT: No lower extremity edema ACCESS: lt BCF   Imaging: No results found.  Labs: BMET Recent Labs  Lab 03/02/21 1800 03/03/21 0544 03/04/21 0416 03/06/21 0819  NA 139 139 139 139  K 4.1 3.4* 4.0 4.3  CL 105 108 106 104  CO2 '26 22 23  '$ --   GLUCOSE 76 93 92 102*  BUN 42* 44* 53* 44*  CREATININE 4.52* 4.48* 5.55* 6.40*  CALCIUM 8.0* 7.2* 7.8*  --   PHOS  --  5.3* 4.9*  --    CBC Recent Labs  Lab 03/02/21 1800 03/03/21 0544 03/04/21 0416 03/05/21 0853 03/06/21 0559 03/06/21 0819  WBC 5.1 4.0 5.2  --  4.8  --   HGB 5.3* 6.8* 7.9* 8.0* 7.7* 9.2*  HCT 16.7* 21.1* 24.0* 25.5* 23.9* 27.0*  MCV 92.8 90.9 90.2  --  90.5  --   PLT 105* 88* 100*  --  88*  --     Medications:     amLODipine  10 mg Oral Daily   carvedilol  12.5 mg Oral BID WC   Chlorhexidine Gluconate Cloth  6 each Topical Q0600   Chlorhexidine Gluconate Cloth  6 each Topical Q0600   darbepoetin (ARANESP) injection - DIALYSIS  200 mcg Intravenous Q Fri-HD   enoxaparin (LOVENOX) injection  30 mg Subcutaneous Q24H   isosorbide mononitrate  60 mg Oral Daily   latanoprost  1 drop Left Eye QHS   pantoprazole  40 mg Oral BID AC      Otelia Santee, MD 03/06/2021, 9:44 AM

## 2021-03-06 NOTE — Progress Notes (Signed)
BP re-checked.

## 2021-03-06 NOTE — Progress Notes (Signed)
Spoke to [place], Agricultural consultant, at [employer] in [place]. Tonya advised of pt's d/c today and to resume out-pt schedule on Wednesday.   Melven Sartorius Renal Navigator  [phone]

## 2021-03-06 NOTE — Op Note (Signed)
[facility] Patient Name: Jeffrey Zuniga Procedure Date : 03/06/2021 MRN: [id number] Attending MD: Jerene Bears , MD Date of Birth: [birth year] CSN: [id number] Age: 65 Admit Type: Inpatient Procedure:                Small bowel enteroscopy Indications:              Abnormal video capsule endoscopy with small bowel                            angioectasias, recent acute on chronic anemia, hx                            of H. Pylori found at EGD in March 2022 Providers:                Lajuan Lines. Hilarie Fredrickson, MD, Kary Kos RN, RN, Janee Morn, Technician Referring MD:             [facility] Medicines:                Monitored Anesthesia Care Complications:            No immediate complications. Estimated Blood Loss:     Estimated blood loss was minimal. Procedure:                Pre-Anesthesia Assessment:                           - Prior to the procedure, a History and Physical                            was performed, and patient medications and                            allergies were reviewed. The patient's tolerance of                            previous anesthesia was also reviewed. The risks                            and benefits of the procedure and the sedation                            options and risks were discussed with the patient.                            All questions were answered, and informed consent                            was obtained. Prior Anticoagulants: The patient has                            taken no previous anticoagulant or antiplatelet  agents. ASA Grade Assessment: III - A patient with                            severe systemic disease. After reviewing the risks                            and benefits, the patient was deemed in                            satisfactory condition to undergo the procedure.                           After obtaining informed consent, the endoscope  was                            passed under direct vision. Throughout the                            procedure, the patient's blood pressure, pulse, and                            oxygen saturations were monitored continuously. The                            [id number] Olympus peds colonoscope was                            introduced through the mouth and advanced to the                            proximal jejunum. The small bowel enteroscopy was                            accomplished without difficulty. The patient                            tolerated the procedure well. Scope In: Scope Out: Findings:      The examined esophagus was normal.      A medium amount of food (residue) was found in the gastric fundus and in       the gastric body suggestive of gastroparesis.      Moderate inflammation characterized by congestion (edema), erosions and       erythema was found in the gastric body, in the gastric antrum and in the       prepyloric region of the stomach. Biopsies were taken with a cold       forceps for histology and Helicobacter pylori testing.      A single 5 mm erosion with stigmata of recent bleeding was found in the       prepyloric region of the stomach. For hemostasis, one hemostatic clip       was successfully placed (MR conditional). There was no bleeding at the       end of the maneuver.      A single angioectasia with bleeding was found in the second portion of  the duodenum. Fulguration to stop the bleeding by argon plasma at 0.5       liters/minute and 20 watts was successful (ERBE right colon setting).       For additional hemostasis post-intervention, one hemostatic clip was       successfully placed (MR conditional). There was no bleeding at the end       of the maneuver.      A single angioectasia with no bleeding was found in the fourth portion       of the duodenum. Fulguration to ablate the lesion to prevent bleeding by       argon plasma at  0.5 liters/minute and 20 watts was successful.      There was no evidence of significant pathology in the examined proximal       jejunum. Impression:               - Normal esophagus.                           - A medium amount of food (residue) in the stomach.                            Query an element of gastroparesis.                           - Gastritis. Biopsied given history of H. Pylori.                           - Pre-pyloric gastric erosion with stigmata of                            recent bleeding (see before enteroscopy). Clip (MR                            conditional) was placed.                           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                            Treated with argon plasma coagulation (APC). Clip                            (MR conditional) was placed.                           - A single non-bleeding angioectasia in the                            duodenum. Treated with argon plasma coagulation                            (APC).                           - The examined portion of the jejunum was normal. Moderate Sedation:      N/A Recommendation:           -  Return patient to hospital ward for ongoing care.                           - Advance diet as tolerated.                           - Await pathology results. Follow-up H. Pylori                            status (previously treated after EGD in March 2022)                           - Monitor Hgb.                           - Continue current medications. Avoid NSAIDs/ASA.                            BID PPI is recommended.                           - Hemodialysis later today.                           - GI will sign off, please call if questions. Procedure Code(s):        --- Professional ---                           [phone], 68, Small intestinal endoscopy, enteroscopy                            beyond second portion of duodenum, not including                            ileum; with  control of bleeding (eg, injection,                            bipolar cautery, unipolar cautery, laser, heater                            probe, stapler, plasma coagulator)                           44361, Small intestinal endoscopy, enteroscopy                            beyond second portion of duodenum, not including                            ileum; with biopsy, single or multiple Diagnosis Code(s):        --- Professional ---                           K29.70, Gastritis, unspecified, without bleeding  K92.2, Gastrointestinal hemorrhage, unspecified                           K31.811, Angiodysplasia of stomach and duodenum                            with bleeding                           R93.3, Abnormal findings on diagnostic imaging of                            other parts of digestive tract CPT copyright 2019 American [employer] Association. All rights reserved. The codes documented in this report are preliminary and upon coder review may  be revised to meet current compliance requirements. Jerene Bears, MD 03/06/2021 9:24:57 AM This report has been signed electronically. Number of Addenda: 0

## 2021-03-06 NOTE — Discharge Summary (Signed)
Name: Jeffrey Zuniga MRN: [id number] DOB: 04-02-1956 65 y.o. PCP: Patient, No Pcp Per (Inactive)  Date of Admission: 03/02/2021  5:53 PM Date of Discharge:  03/06/2021 Attending Physician: Dr. Daryll Drown  DISCHARGE DIAGNOSIS:  Primary Problem: Symptomatic anemia   Hospital Problems: Principal Problem:   Symptomatic anemia Active Problems:   Abnormal finding on GI tract imaging   Acute gastritis without hemorrhage   Angiodysplasia of small intestine (HCC)    DISCHARGE MEDICATIONS:   Allergies as of 03/06/2021   No Known Allergies      Medication List     TAKE these medications    amLODipine 10 MG tablet Commonly known as: NORVASC Take 1 tablet (10 mg total) by mouth daily.   carvedilol 12.5 MG tablet Commonly known as: COREG Take 1 tablet (12.5 mg total) by mouth 2 (two) times daily with a meal.   ferrous sulfate 325 (65 FE) MG tablet Take 1 tablet (325 mg total) by mouth daily with breakfast.   isosorbide mononitrate 60 MG 24 hr tablet Commonly known as: IMDUR Take 1 tablet (60 mg total) by mouth daily.   latanoprost 0.005 % ophthalmic solution Commonly known as: XALATAN Place 1 drop into the left eye at bedtime.   pantoprazole 40 MG tablet Commonly known as: PROTONIX Take 1 tablet (40 mg total) by mouth daily.        DISPOSITION AND FOLLOW-UP:  Jeffrey Zuniga was discharged from [facility] in Stable condition. At the hospital follow up visit please address:  #Symptomatic anemia #Thrombocytopenia Patient may benefit from outpatient work-up for chronic anemia and recurrent thrombocytopenia.  Trend CBC.   #History of H. pylori gastritis Follow-up biopsy results for biopsy for H. Pylori.  Treat if positive.  #ESRD Patient undergoes hemodialysis on Monday, Wednesday, Friday.    #Grade 2 diastolic dysfunction Ensure patient is continuing home medication of Coreg 12.5 mg twice daily.  #Hypertension Ensure patient is continuing home  medications of Coreg 12.5 mg twice daily, amlodipine 10 mg daily, Imdur 60 mg.   #Type 2 diabetes mellitus Trend HbA1c.  Currently not requiring medical management.  #Glaucoma Patient has appointment with Margot Ables on October 24 at 1 PM.  #Hepatitis C antibody positive Ensure adequate follow-up with infectious disease.  Follow-up Recommendations: Consults: Ophthalmology Labs: Basic Metabolic Profile and CBC Studies: None Medications: No changes have been made.  Follow-up Appointments:  Follow-up Information     [facility] Go on 04/03/2021.   Why: Appointment is at 1 p.m. Contact information: [address] [place] [address] [phone]                 HOSPITAL COURSE:  Patient Summary: #Symptomatic anemia Patient initially presented to ED after being made aware of outpatient labs that showed hemoglobin of 4.9.  Admission labs showed hemoglobin of 5.3.  He received 2 units packed red blood cells at that time which increased his hemoglobin to 6.8.  He received a third unit of packed red blood cells following this. Iron panel 9/23 showed iron 60, TIBC 340, saturation ratios 18, UIBC 280.  FOBT was positive on admission.  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  On 9/26 he underwent enteroscopy with findings described below.  He was continued on iron, darbepoetin alpha, Protonix during this admission.  Hemoglobin remained stable on discharge at 7.7.    #  History of H. pylori gastritis H. pylori gastritis was diagnosed during EGD in 08/2020.  He was treated with triple therapy at that time and recommended to follow-up with capsule endoscopy however this has not taken place yet.  Capsule endoscopy was completed 9/24 and showed blood in the early duodenum and small bowel AVMs.  On 9/26 he underwent enteroscopy a medium amount of food in the  gastric fundus and body suggestive of gastroparesis; moderate inflammation characterized by congestion, erosions, erythema in the gastric body and antrum as well as prepyloric region of the stomach which were biopsied for H. pylori; a single 5 mm erosion with stigmata of recent bleeding in the prepyloric region of the stomach which had 1 hemostatic clip placed; a single angiectasia with bleeding in the second portion of the duodenum which had fulguration to stop the bleeding by argon plasma at 0.5 L/min and 20 W as well as 1 hemostatic clip placed; a single angiectasia with no bleeding in the fourth portion of the duodenum which had fulguration to ablate the lesion to prevent bleeding by argon plasma 0.5 L/min and 20 W.  Biopsy for H. pylori is pending at time of discharge.   #ESRD Patient undergoes hemodialysis on Monday, Wednesday, Friday.  Nephrology was consulted on admission and helped set up inpatient dialysis. He continued EPA during dialysis sessions and vitamin D supplementation for hyperparathyroidism.  He received inpatient HD on 9/23 and prior to discharge on 9/26.    #Grade 2 diastolic dysfunction Chronic.  Patient was continued on home medication of Coreg 12.5 mg twice daily during this admission.    #Hypertension Patient was hypertensive on admission with a BP max of 178/93.  He was continued on home medications of Coreg 12.5 mg twice daily, amlodipine 10 mg daily, Imdur 60 mg daily during this admission.    #Type 2 diabetes mellitus HbA1c of 4.8% on admission.  This has been diet controlled.   #Glaucoma Chronically managed.  He was continued on home latanoprost.   #Thrombocytopenia Noted initially on recent admission 8/30 through 9/7.  Platelet count was labile, though overall up-trended throughout this admission with day of discharge value of 88.   #Hepatitis C antibody positive Patient found to have positive hepatitis C antibody during most recent admission.  He has  followed up with infectious disease, though he has not initiated treatment at this time.   DISCHARGE INSTRUCTIONS:   Discharge Instructions     (HEART FAILURE PATIENTS) Call MD:  Anytime you have any of the following symptoms: 1) 3 pound weight gain in 24 hours or 5 pounds in 1 week 2) shortness of breath, with or without a dry hacking cough 3) swelling in the hands, feet or stomach 4) if you have to sleep on extra pillows at night in order to breathe.   Complete by: As directed    Diet - low sodium heart healthy   Complete by: As directed    Increase activity slowly   Complete by: As directed    No wound care   Complete by: As directed        SUBJECTIVE:  Patient evaluated at bedside on day of discharge.  He had recently returned from the endoscopy suite and reported feeling overall well.  He reiterates how committed he is to taking care of his health, and requests that he receive refills on his home medications prior to discharge so that he does not have to go without them between discharge and having these refilled  by his primary care physician.  Discharge Vitals:   BP (!) 175/75 (BP Location: Right Arm)   Pulse 66   Temp 97.8 F (36.6 C)   Resp 18   Ht '5\' 7"'$  (1.702 m)   Wt 71.4 kg   SpO2 100%   BMI 24.65 kg/m   OBJECTIVE:  Constitutional: Patient is sitting on side of bed enjoying breakfast.  No acute distress noted. Cardio: Regular rate and rhythm.  No murmurs, rubs, gallops. Pulm: Clear to auscultation bilaterally.  Normal work of breathing on room air. Chest: Right sided IJ tunneled catheter. Abdomen: Soft, nontender, nondistended. MSK: Negative for edema to bilateral lower extremities.  Left sided AV fistula in antecubital area of upper extremity with palpable bruit. Skin: Skin is warm and dry. Neuro: Alert and oriented x3.  No focal deficit noted. Psych: Appropriate mood and affect.  Pertinent Labs, Studies, and Procedures:  CBC Latest Ref Rng & Units 03/06/2021  03/06/2021 03/05/2021  WBC 4.0 - 10.5 K/uL - 4.8 -  Hemoglobin 13.0 - 17.0 g/dL 9.2(L) 7.7(L) 8.0(L)  Hematocrit 39.0 - 52.0 % 27.0(L) 23.9(L) 25.5(L)  Platelets 150 - 400 K/uL - 88(L) -    CMP Latest Ref Rng & Units 03/06/2021 03/04/2021 03/03/2021  Glucose 70 - 99 mg/dL 102(H) 92 93  BUN 8 - 23 mg/dL 44(H) 53(H) 44(H)  Creatinine 0.61 - 1.24 mg/dL 6.40(H) 5.55(H) 4.48(H)  Sodium 135 - 145 mmol/L 139 139 139  Potassium 3.5 - 5.1 mmol/L 4.3 4.0 3.4(L)  Chloride 98 - 111 mmol/L 104 106 108  CO2 22 - 32 mmol/L - 23 22  Calcium 8.9 - 10.3 mg/dL - 7.8(L) 7.2(L)  Total Protein 6.5 - 8.1 g/dL - - -  Total Bilirubin 0.3 - 1.2 mg/dL - - -  Alkaline Phos 38 - 126 U/L - - -  AST 15 - 41 U/L - - -  ALT 0 - 44 U/L - - -    No results found.   Signed: Farrel Gordon, D.O.  Internal Medicine Resident, PGY-1 [facility]  Pager: [phone] 1:34 PM, 03/06/2021

## 2021-03-06 NOTE — Transfer of Care (Signed)
Immediate Anesthesia Transfer of Care Note  Patient: Jeffrey Zuniga  Procedure(s) Performed: ENTEROSCOPY HEMOSTASIS CLIP PLACEMENT HOT HEMOSTASIS (ARGON PLASMA COAGULATION/BICAP) BIOPSY  Patient Location: Endoscopy Unit  Anesthesia Type:MAC  Level of Consciousness: awake, alert  and oriented  Airway & Oxygen Therapy: Patient Spontanous Breathing and Patient connected to face mask oxygen  Post-op Assessment: Report given to RN and Post -op Vital signs reviewed and stable  Post vital signs: Reviewed and stable  Last Vitals:  Vitals Value Taken Time  BP 165/72   Temp    Pulse 79 03/06/21 0910  Resp 17 03/06/21 0910  SpO2 100 % 03/06/21 0910  Vitals shown include unvalidated device data.  Last Pain:  Vitals:   03/06/21 0817  TempSrc:   PainSc: 0-No pain         Complications: No notable events documented.

## 2021-03-06 NOTE — Plan of Care (Signed)
  Problem: Nutrition: Goal: Adequate nutrition will be maintained Outcome: Progressing   Problem: Pain Managment: Goal: General experience of comfort will improve Outcome: Progressing   Problem: Elimination: Goal: Will not experience complications related to bowel motility Outcome: Progressing   Problem: Clinical Measurements: Goal: Will remain free from infection Outcome: Progressing

## 2021-03-06 NOTE — Telephone Encounter (Signed)
[facility] Patient Advocate Encounter  Prior Authorization for Raeanne Gathers has been approved.    PA# [id number] Effective dates: 03/03/21 through 05/26/21  Prescription will need to be filled with [facility]).   Once script is sent I will see if patient have a copay.       [facility] will continue to follow.  Ileene Patrick, [facility] Patient [facility] Phone: [phone] Fax:  [phone]

## 2021-03-06 NOTE — Progress Notes (Signed)
DISCHARGE NOTE    Jeffrey Zuniga to be discharged Home per MD order. Patient verbalized understanding.  Skin clean, dry and intact without evidence of skin break down, no evidence of skin tears noted. IV catheter discontinued intact. Site without signs and symptoms of complications. Dressing and pressure applied. Pt denies pain at the site currently. No complaints noted.  Patient free of lines, drains, and wounds.   Discharge packet assembled. An After Visit Summary (AVS) was printed and given to the patient. Patient escorted via wheelchair and  discharged to home via public transport.    Babs Sciara, RN

## 2021-03-07 ENCOUNTER — Ambulatory Visit: Payer: Self-pay | Admitting: Family

## 2021-03-07 ENCOUNTER — Other Ambulatory Visit (HOSPITAL_COMMUNITY): Payer: Self-pay

## 2021-03-07 LAB — HEPATITIS B SURFACE ANTIBODY, QUANTITATIVE: Hep B S AB Quant (Post): <3.1 m[IU]/mL — ABNORMAL LOW (ref >9.9)

## 2021-03-07 LAB — SURGICAL PATHOLOGY

## 2021-03-07 NOTE — Telephone Encounter (Signed)
Patient is approved to receive Epclusa x 12 weeks for chronic Hepatitis C infection. Counseled patient to take Epclusa daily with or without food. Encouraged patient not to miss any doses and explained how their chance of cure could go down with each dose missed. Counseled patient on what to do if dose is missed - if it is closer to the missed dose take immediately; if closer to next dose skip dose and take the next dose at the usual time. Counseled patient on common side effects such as headache, fatigue, and nausea and that these normally decrease with time. I reviewed patient medications and found a drug interaction with pantoprazole. He states he was prescribed this medication while he was in the hospital, but he hasn't been taking it as he has not had any problems with his stomach acid. I advised him to continue not taking pantoprazole and if he were to restart it, or start taking any other medications he should contact the [place] clinic to make sure they do not interact with his Epclusa.  Also advised patient to call if he experiences any side effects. Informed patient Butch Penny would be contacting him in the next week or so to let him know if there was a co-pay associated with his Raeanne Gathers and how he would be receiving the medication.

## 2021-03-08 ENCOUNTER — Other Ambulatory Visit: Payer: Self-pay | Admitting: Pharmacist

## 2021-03-08 ENCOUNTER — Encounter (HOSPITAL_COMMUNITY): Payer: Self-pay | Admitting: Internal Medicine

## 2021-03-08 ENCOUNTER — Encounter: Payer: Self-pay | Admitting: Internal Medicine

## 2021-03-08 DIAGNOSIS — B182 Chronic viral hepatitis C: Secondary | ICD-10-CM

## 2021-03-08 LAB — LIVER FIBROSIS, FIBROTEST-ACTITEST
ALT: 24 U/L (ref 9–46)
Alpha-2-Macroglobulin: 249 mg/dL (ref 106–279)
Apolipoprotein A1: 172 mg/dL (ref 94–176)
Bilirubin: 0.3 mg/dL (ref 0.2–1.2)
Fibrosis Score: 0.33
GGT: 55 U/L (ref 3–70)
Haptoglobin: 94 mg/dL (ref 43–212)
Necroinflammat ACT Score: 0.11
Reference ID: 4043197

## 2021-03-08 LAB — HEPATITIS A ANTIBODY, TOTAL: Hepatitis A AB,Total: REACTIVE — AB

## 2021-03-08 LAB — PROTIME-INR
INR: 1
Prothrombin Time: 10.3 s (ref 9.0–11.5)

## 2021-03-08 MED ORDER — SOFOSBUVIR-VELPATASVIR 400-100 MG PO TABS: 1.0000 | ORAL_TABLET | Freq: Every day | ORAL | 2 refills | Status: DC

## 2021-03-08 NOTE — Progress Notes (Signed)
Patient's insurance requires Epclusa to be filled at [facility]. Resending Rx now. Butch Penny will call patient and coordinate.

## 2021-03-09 ENCOUNTER — Other Ambulatory Visit: Payer: Self-pay | Admitting: Pharmacist

## 2021-03-09 DIAGNOSIS — B182 Chronic viral hepatitis C: Secondary | ICD-10-CM

## 2021-03-09 MED ORDER — SOFOSBUVIR-VELPATASVIR 400-100 MG PO TABS: 1.0000 | ORAL_TABLET | Freq: Every day | ORAL | 2 refills | Status: AC

## 2021-03-09 NOTE — Progress Notes (Signed)
Printing Rx to send to pharmacy.

## 2021-03-10 NOTE — Progress Notes (Signed)
Starting epclusa for chronic hepatitis C management; fibrosis risk score low and recent imaging all reviewed w/o concern for decompensated disease nor tumor.  Recently d/c'd with GI bleed from the hospital on PPIs.

## 2021-03-14 ENCOUNTER — Telehealth: Payer: Self-pay

## 2021-03-14 NOTE — Telephone Encounter (Signed)
[facility] Patient Advocate Encounter  [facility] and I have been unsuccsessful in reaching patient to be able to refill medication.    Patient will need to call pharmacy so they can mail medication to him. [facility] # [phone]  We have tried multiple times without a response.  Ileene Patrick, [facility] Patient [facility] Phone: [phone] Fax:  [phone]

## 2021-03-16 ENCOUNTER — Other Ambulatory Visit (HOSPITAL_COMMUNITY): Payer: Self-pay

## 2021-03-16 ENCOUNTER — Ambulatory Visit (HOSPITAL_COMMUNITY): Payer: 59 | Attending: Infectious Diseases

## 2021-03-28 ENCOUNTER — Other Ambulatory Visit: Payer: Self-pay

## 2021-03-28 ENCOUNTER — Ambulatory Visit (INDEPENDENT_AMBULATORY_CARE_PROVIDER_SITE_OTHER): Payer: 59 | Admitting: Student

## 2021-03-28 ENCOUNTER — Encounter: Payer: Self-pay | Admitting: Student

## 2021-03-28 VITALS — BP 166/80 | HR 80 | Ht 67.0 in | Wt 161.4 lb

## 2021-03-28 DIAGNOSIS — I10 Essential (primary) hypertension: Secondary | ICD-10-CM | POA: Diagnosis not present

## 2021-03-28 DIAGNOSIS — I5033 Acute on chronic diastolic (congestive) heart failure: Secondary | ICD-10-CM | POA: Diagnosis not present

## 2021-03-28 DIAGNOSIS — K2990 Gastroduodenitis, unspecified, without bleeding: Secondary | ICD-10-CM

## 2021-03-28 DIAGNOSIS — B182 Chronic viral hepatitis C: Secondary | ICD-10-CM

## 2021-03-28 DIAGNOSIS — D500 Iron deficiency anemia secondary to blood loss (chronic): Secondary | ICD-10-CM | POA: Diagnosis not present

## 2021-03-28 DIAGNOSIS — K5520 Angiodysplasia of colon without hemorrhage: Secondary | ICD-10-CM | POA: Diagnosis not present

## 2021-03-28 DIAGNOSIS — D6489 Other specified anemias: Secondary | ICD-10-CM

## 2021-03-28 HISTORY — DX: Gastroduodenitis, unspecified, without bleeding: K29.90

## 2021-03-28 MED ORDER — LABETALOL HCL 100 MG PO TABS: 200.0000 mg | ORAL_TABLET | Freq: Two times a day (BID) | ORAL | 5 refills | Status: DC

## 2021-03-28 MED ORDER — ISOSORBIDE MONONITRATE ER 60 MG PO TB24: 60.0000 mg | ORAL_TABLET | Freq: Every day | ORAL | 4 refills | Status: DC

## 2021-03-28 MED ORDER — PANTOPRAZOLE SODIUM 40 MG PO TBEC: 40.0000 mg | DELAYED_RELEASE_TABLET | Freq: Every day | ORAL | 6 refills | Status: AC

## 2021-03-28 MED ORDER — AMLODIPINE BESYLATE 10 MG PO TABS: 10.0000 mg | ORAL_TABLET | Freq: Every day | ORAL | 9 refills | Status: DC

## 2021-03-28 MED ORDER — FERROUS SULFATE 325 (65 FE) MG PO TABS: 325.0000 mg | ORAL_TABLET | Freq: Every day | ORAL | 6 refills | Status: DC

## 2021-03-28 NOTE — Patient Instructions (Addendum)
Jeffrey Zuniga, It is such a joy to take care you! Thank you for coming in today.   As a reminder, here is a recap of what we talked about today:  - I am glad to get to meet you.  I am sending refills of the medications that you need to your pharmacy at the [facility] on [address].  I am not making any changes to medications at this time.  I am looking forward to being your primary care doctor and you are welcome to make an appointment with me anytime that you need.  -The Protonix that you had been taking can interfere with your hep C medication.  Please wait at least four hours after taking your Epclusa before taking your Protonix.   We are checking some labs today. I will call you if they are abnormal. I will send you a MyChart message or a letter if they are normal.  If you do not hear about your labs in the next 2 weeks please let us know.  I recommend that you always bring your medications to each appointment as this makes it easy to ensure we are on the correct medications and helps [place] not miss when refills are needed.  Take care and seek immediate care sooner if you develop any concerns.   Marnee Guarneri, MD [place]

## 2021-03-28 NOTE — Assessment & Plan Note (Signed)
Patient with multiple mechanisms for anemia: acute blood loss in the setting of recent GI bleed, Chronic kidney disease, and iron deficiency. - Refilled Ferrous sulfate - Will re-check CBC to ensure improvement since hospital discharge

## 2021-03-28 NOTE — Assessment & Plan Note (Signed)
Patient is currently on Protonix 40 mg daily.  Noted that this can interfere with the absorption of his Epclusa.  However given his recent GI bleed, will not discontinue Protonix at this time.  Educated patient on the importance of leaving at least 4 hours between his Epclusa and Protonix doses.

## 2021-03-28 NOTE — Progress Notes (Signed)
    SUBJECTIVE:   CHIEF COMPLAINT / HPI:   New Patient here to establish care Patient has a history of hypertension, diastolic heart failure, chronic hep C, and ESRD on HD MWF.  He is here today to establish care with me as his new PCP.  Anemia, multifactorial  H. Pylori gastritis, treated AVM, small bowel Patient was recently hospitalized on the internal medicine teaching service for symptomatic anemia to hemoglobin of 4.9.  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  He underwent argon plasma ablation with resolution of the bleeding.  He has a history of H. pylori gastritis but biopsy for H. pylori this hospitalization was negative.  At the time of discharge from the hospital his hemoglobin was 7.7. Also has ESRD (HD MWF) and Iron deficiency anemia.   Hepatitis C  Was noted to be Hep C antibody positive at a recent hospitalization. Is currently on Epclusa and follows with infectious disease.   HTN  HFpEF  Home meds include Labetalol '200mg'$  BID, amlodipine '10mg'$  daily, and Imdur 60 mg daily.  He was discharged from the hospital on Coreg 12.5 mg twice daily but his nephrologist transitioned him from Coreg to labetalol at a recent appointment.   PERTINENT  PMH / PSH: HTN, HFpEF, Hepatitis C, Chronic Anemia    OBJECTIVE:   BP (!) 166/80   Pulse 80   Ht '5\' 7"'$  (1.702 m)   Wt 161 lb 6.4 oz (73.2 kg)   SpO2 100%   BMI 25.28 kg/m   Physical Exam Vitals reviewed.  Constitutional:      General: He is not in acute distress. Cardiovascular:     Comments: Regular rate and rhythm, transmitted fistula sounds Pulmonary:     Effort: Pulmonary effort is normal.     Breath sounds: Normal breath sounds.  Abdominal:     General: There is no distension.     Palpations: Abdomen is soft.     Tenderness: There is no abdominal tenderness.  Musculoskeletal:     Right lower leg: No edema.     Left lower leg: No edema.  Skin:    General: Skin is warm and dry.      ASSESSMENT/PLAN:   Gastritis and duodenitis Patient is currently on Protonix 40 mg daily.  Noted that this can interfere with the absorption of his Epclusa.  However given his recent GI bleed, will not discontinue Protonix at this time.  Educated patient on the importance of leaving at least 4 hours between his Epclusa and Protonix doses.  Chronic hepatitis C without hepatic coma [facility]) Patient has reliable follow-up with Janene Madeira at infectious disease.  He takes his [place] daily.  Discussed the importance of separating his Epclusa and Protonix doses as discussed above.  Essential hypertension BP elevated to 166/80 today.  Patient was recently transition from Coreg to labetalol. Defer to nephrology regarding medication changes. Refilled his labetalol and amlodipine per patient request.   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 Patient with multiple mechanisms for anemia: acute blood loss in the setting of recent GI bleed, Chronic kidney disease, and iron deficiency. - Refilled Ferrous sulfate - Will re-check CBC to ensure improvement since hospital discharge     Pearla Dubonnet, [place]

## 2021-03-28 NOTE — Assessment & Plan Note (Addendum)
BP elevated to 166/80 today.  Patient was recently transition from Coreg to labetalol. Defer to nephrology regarding medication changes. Refilled his labetalol and amlodipine per patient request.

## 2021-03-28 NOTE — Assessment & Plan Note (Signed)
Patient has reliable follow-up with Janene Madeira at infectious disease.  He takes his [place] daily.  Discussed the importance of separating his Epclusa and Protonix doses as discussed above.

## 2021-03-29 LAB — CBC
Hematocrit: 28 % — ABNORMAL LOW (ref 37.5–51.0)
Hemoglobin: 9.4 g/dL — ABNORMAL LOW (ref 13.0–17.7)
MCH: 29 pg (ref 26.6–33.0)
MCHC: 33.6 g/dL (ref 31.5–35.7)
MCV: 86 fL (ref 79–97)
Platelets: 85 10*3/uL — CL (ref 150–450)
RBC: 3.24 x10E6/uL — ABNORMAL LOW (ref 4.14–5.80)
RDW: 17 % — ABNORMAL HIGH (ref 11.6–15.4)
WBC: 5.9 10*3/uL (ref 3.4–10.8)

## 2021-04-06 ENCOUNTER — Encounter (INDEPENDENT_AMBULATORY_CARE_PROVIDER_SITE_OTHER): Payer: Self-pay | Admitting: Ophthalmology

## 2021-04-06 ENCOUNTER — Other Ambulatory Visit: Payer: Self-pay

## 2021-04-06 ENCOUNTER — Ambulatory Visit (INDEPENDENT_AMBULATORY_CARE_PROVIDER_SITE_OTHER): Payer: 59 | Admitting: Ophthalmology

## 2021-04-06 DIAGNOSIS — H35371 Puckering of macula, right eye: Secondary | ICD-10-CM | POA: Diagnosis not present

## 2021-04-06 DIAGNOSIS — H2511 Age-related nuclear cataract, right eye: Secondary | ICD-10-CM | POA: Diagnosis not present

## 2021-04-06 DIAGNOSIS — H43821 Vitreomacular adhesion, right eye: Secondary | ICD-10-CM

## 2021-04-06 DIAGNOSIS — E113411 Type 2 diabetes mellitus with severe nonproliferative diabetic retinopathy with macular edema, right eye: Secondary | ICD-10-CM | POA: Diagnosis not present

## 2021-04-06 DIAGNOSIS — E113412 Type 2 diabetes mellitus with severe nonproliferative diabetic retinopathy with macular edema, left eye: Secondary | ICD-10-CM | POA: Diagnosis not present

## 2021-04-06 DIAGNOSIS — H25041 Posterior subcapsular polar age-related cataract, right eye: Secondary | ICD-10-CM

## 2021-04-06 HISTORY — DX: Puckering of macula, right eye: H35.371

## 2021-04-06 HISTORY — DX: Posterior subcapsular polar age-related cataract, right eye: H25.041

## 2021-04-06 HISTORY — DX: Vitreomacular adhesion, right eye: H43.821

## 2021-04-06 HISTORY — DX: Age-related nuclear cataract, right eye: H25.11

## 2021-04-06 NOTE — Assessment & Plan Note (Signed)
Very dense cataract with PSC requires surgical removal with implantation lens to allow posterior pole monitoring and intervention in order to recover best possible visual acuity, including the potential need for vitrectomy membrane peel

## 2021-04-06 NOTE — Assessment & Plan Note (Signed)
OD, dense PSC centrally.  This hampers visualization posteriorly medical monitoring and therapy of the fundus and findings in the right eye.  He needs cataract surgery and implantation of lens in the right eye prior to undertaking potential vitrectomy right eye

## 2021-04-06 NOTE — Assessment & Plan Note (Signed)
This is also component of vitreomacular traction which also may be related to possible undetectable proliferative diabetic retinopathy.

## 2021-04-06 NOTE — Progress Notes (Signed)
04/06/2021     CHIEF COMPLAINT Patient presents for  Chief Complaint  Patient presents with   Retina Evaluation      HISTORY OF PRESENT ILLNESS: Jeffrey Zuniga is a 65 y.o. male who presents to the clinic today for:   HPI     Retina Evaluation   In both eyes.  Duration of 1 week.  Associated Symptoms Floaters.  Context:  distance vision, mid-range vision and near vision.        Comments   NP Retinal Evaluation per Dr. Shirleen Schirmer for cataract surgery clearance with history of Diabetic Retinopathy OU and Glaucoma OS.  EyeMeds: Latanoprost QHS OS      Last edited by Reather Littler, COA on 04/06/2021  3:21 PM.      Referring physician: Warden Fillers, MD [address] [place],  [place] [id number]  HISTORICAL INFORMATION:   Selected notes from the MEDICAL RECORD NUMBER    Lab Results  Component Value Date   HGBA1C 4.8 03/03/2021     CURRENT MEDICATIONS: Current Outpatient Medications (Ophthalmic Drugs)  Medication Sig   latanoprost (XALATAN) 0.005 % ophthalmic solution Place 1 drop into the left eye at bedtime.   No current facility-administered medications for this visit. (Ophthalmic Drugs)   Current Outpatient Medications (Other)  Medication Sig   amLODipine (NORVASC) 10 MG tablet Take 1 tablet (10 mg total) by mouth daily.   Darbepoetin Alfa (ARANESP) 200 MCG/0.4ML SOSY injection Inject 0.4 mLs (200 mcg total) into the vein every Monday with hemodialysis.   ferrous sulfate 325 (65 FE) MG tablet Take 1 tablet (325 mg total) by mouth daily with breakfast.   isosorbide mononitrate (IMDUR) 60 MG 24 hr tablet Take 1 tablet (60 mg total) by mouth daily.   labetalol (NORMODYNE) 100 MG tablet Take 2 tablets (200 mg total) by mouth 2 (two) times daily.   pantoprazole (PROTONIX) 40 MG tablet Take 1 tablet (40 mg total) by mouth daily.   Sofosbuvir-Velpatasvir (EPCLUSA) 400-100 MG TABS Take 1 tablet by mouth daily.   No current facility-administered  medications for this visit. (Other)      REVIEW OF SYSTEMS:    ALLERGIES No Known Allergies  PAST MEDICAL HISTORY Past Medical History:  Diagnosis Date   Acute on chronic kidney failure ([place])    Anemia 04/04/2020   Blood transfusion without reported diagnosis    Diabetes mellitus without complication ([place])    GI bleed    H. pylori infection    Hemorrhoids    Hyperplastic colon polyp    Hypertension    Past Surgical History:  Procedure Laterality Date   AV FISTULA PLACEMENT Left 02/10/2021   Procedure: LEFT UPPER EXTREMITY ARTERIOVENOUS (AV) FISTULA  CREATION;  Surgeon: Waynetta Sandy, MD;  Location: [place];  Service: Vascular;  Laterality: Left;   BIOPSY  08/31/2020   Procedure: BIOPSY;  Surgeon: Irving Copas., MD;  Location: [place];  Service: Gastroenterology;;   BIOPSY  03/06/2021   Procedure: BIOPSY;  Surgeon: Jerene Bears, MD;  Location[facility] ENDOSCOPY;  Service: Gastroenterology;;   CARDIAC CATHETERIZATION     COLONOSCOPY WITH PROPOFOL N/A 08/31/2020   Procedure: COLONOSCOPY WITH PROPOFOL;  Surgeon: Irving Copas., MD;  Location: [place];  Service: Gastroenterology;  Laterality: N/A;   ENTEROSCOPY N/A 03/06/2021   Procedure: ENTEROSCOPY;  Surgeon: Jerene Bears, MD;  Location[facility] ENDOSCOPY;  Service: Gastroenterology;  Laterality: N/A;   ESOPHAGOGASTRODUODENOSCOPY (EGD) WITH PROPOFOL N/A 08/31/2020  Procedure: ESOPHAGOGASTRODUODENOSCOPY (EGD) WITH PROPOFOL;  Surgeon: Rush Landmark Telford Nab., MD;  Location: [place];  Service: Gastroenterology;  Laterality: N/A;   GIVENS CAPSULE STUDY N/A 03/04/2021   Procedure: GIVENS CAPSULE STUDY;  Surgeon: Gatha Mayer, MD;  Location: [place];  Service: Endoscopy;  Laterality: N/A;   HEMOSTASIS CLIP PLACEMENT  03/06/2021   Procedure: HEMOSTASIS CLIP PLACEMENT;  Surgeon: Jerene Bears, MD;  Location: [place] ENDOSCOPY;  Service: Gastroenterology;;   HOT HEMOSTASIS N/A 03/06/2021   Procedure: HOT  HEMOSTASIS (ARGON PLASMA COAGULATION/BICAP);  Surgeon: Jerene Bears, MD;  Location[facility] ENDOSCOPY;  Service: Gastroenterology;  Laterality: N/A;   IR FLUORO GUIDE CV LINE RIGHT  02/09/2021   IR US GUIDE VASC ACCESS RIGHT  02/09/2021   POLYPECTOMY  08/31/2020   Procedure: POLYPECTOMY;  Surgeon: Mansouraty, Telford Nab., MD;  Location: [facility] ENDOSCOPY;  Service: Gastroenterology;;    FAMILY HISTORY Family History  Problem Relation Age of Onset   Liver disease Neg Hx    Liver cancer Neg Hx     SOCIAL HISTORY Social History   Tobacco Use   Smoking status: Every Day    Packs/day: 0.25    Types: Cigarettes   Smokeless tobacco: Never   Tobacco comments:    2021  " i AM CUTTING BACK :"  Vaping Use   Vaping Use: Never used  Substance Use Topics   Alcohol use: Not Currently   Drug use: Never         OPHTHALMIC EXAM:  Base Eye Exam     Visual Acuity (ETDRS)       Right Left   Dist cc 20/60 NLP   Dist ph cc NI     Correction: Glasses         Tonometry (Tonopen, 3:27 PM)       Right Left   Pressure 15 39         Pupils       Dark Light Shape React APD   Right 3 2 Round Brisk None   Left 4 4 Round Minimal +1         Visual Fields       Left Right     Full   Restrictions Total superior temporal, inferior temporal, superior nasal, inferior nasal deficiencies          Extraocular Movement       Right Left    Full, Ortho Full, Ortho         Neuro/Psych     Oriented x3: Yes   Mood/Affect: Normal         Dilation     Right eye: 1.0% Mydriacyl, 2.5% Phenylephrine @ 3:28 PM           Slit Lamp and Fundus Exam     External Exam       Right Left   External Normal Normal         Slit Lamp Exam       Right Left   Lids/Lashes Normal Normal   Conjunctiva/Sclera White and quiet White and quiet   Cornea Clear Clear   Anterior Chamber Deep and quiet Deep and quiet   Iris Round and reactive Round and reactive   Lens 2+ Nuclear sclerosis, 2+  Cortical cataract, 3+ Posterior subcapsular cataract Clear   Anterior Vitreous Normal Normal         Fundus Exam       Right Left   Posterior Vitreous Unclear vitreous status may be tractional poor  view through cataract    Disc Poor details 4+ Pallor   C/D Ratio 0.4 1.0   Macula No clear details likely thickening, Moderate clinically significant macular edema    Vessels PDR May be active poor details through dense catarac White lines of peripheral retinal nonperfusion with scattered peripheral retinal hemorrhages.   Periphery Appears attached Avascular retina.  May need peripheral PRP 1-Day simply to prevent progression to further neovascular changes in the left eye            IMAGING AND PROCEDURES  Imaging and Procedures for 04/06/21  OCT, Retina - OU - Both Eyes       Right Eye Quality was borderline. Scan locations included subfoveal. Findings include epiretinal membrane, cystoid macular edema.   Left Eye Quality was good.   Notes OD, severe CSME with secondary vitreomacular traction and epiretinal membrane of the right eye this could be diabetic related However there is on the superior portion of macula severe CME , CSME.  There may in fact be some atrophic change in the macula details obscured by lens opacity.   OS with diffuse macular edema yet with NLP vision no hope for improved acuity will continue to observe with clear media OS       Color Fundus Photography Optos - OU - Both Eyes       Right Eye Progression has no prior data.   Left Eye Progression has no prior data. Disc findings include increased cup to disc ratio, pallor.   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  No clear peripheral retinal detail seen  OS with clear media white lines of retinal nonperfusion peripherally with a totally cupped left eye coincident with no light perception  vision             ASSESSMENT/PLAN:  Nuclear sclerotic cataract of right eye Very dense cataract with PSC requires surgical removal with implantation lens to allow posterior pole monitoring and intervention in order to recover best possible visual acuity, including the potential need for vitrectomy membrane peel  Posterior subcapsular age-related cataract, right eye OD, dense PSC centrally.  This hampers visualization posteriorly medical monitoring and therapy of the fundus and findings in the right eye.  He needs cataract surgery and implantation of lens in the right eye prior to undertaking potential vitrectomy right eye  Macular pucker, right eye This is also component of vitreomacular traction which also may be related to possible undetectable proliferative diabetic retinopathy.  Vitreomacular traction syndrome, right Will suggest cataract surgery first and thereafter we address the macular findings surgically if required     ICD-10-CM   1. Macular pucker, right eye  H35.371 OCT, Retina - OU - Both Eyes    Color Fundus Photography Optos - OU - Both Eyes    2. 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 Severe nonproliferative diabetic retinopathy of left eye, with macular edema, associated with type 2 diabetes mellitus (HCC)  Q65.7846 OCT, Retina - OU - Both Eyes    4. Nuclear sclerotic cataract of right eye  H25.11     5. Posterior subcapsular age-related cataract, right eye  H25.041     6. Vitreomacular traction syndrome, right  H43.821       1.  OD patient is cleared to proceed with cataract surgery with intraocular implantation in the right eye first.  We will sometimes treat the underlying diabetic maculopathy macular edema with antivegF prior to cataract surgery however in this case there  could be tractional elements from untreated and undiagnosable proliferative diabetic retinopathy which in that case could exacerbate the vitreomacular traction leading to a retinal detachment should antivegF be delivered and surgical vitrectomy not be undertaking within the next 72 hours.    For this reason  I recommend no antivegF delivery today, proceed with cataract surgery in the right eye and immediately reevaluate within 1 week the retina to reassess the macula and the status of any severe NPDR or is this PDR with tractional change on the macula  2.  Follow-up Dr. Richardson Chiquito for cataract surgery promptly  3.  OS may in fact have the patient return for peripheral PRP just to prevent ongoing neovascular stimulus from a vascular retina.  #4.  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  However the presence of CME in the left eye does suggest that there is some competency to the retinal vasculature and this may have been either glaucomatous damage or AI ON.  But not compromised to the retinal vasculature.  Peripheral avascular retina poses ongoing risk for neovascular glaucoma  Ophthalmic Meds Ordered this visit:  No orders of the defined types were placed in this encounter.      Return in about 2 weeks (around 04/20/2021) for dilate, OS, PRP.  There are no Patient Instructions on file for this visit.   Explained the diagnoses, plan, and follow up with the patient and they expressed understanding.  Patient expressed understanding of the importance of proper follow up care.   Clent Demark Lenzi Marmo M.D. Diseases & Surgery of the Retina and Vitreous Retina & Diabetic [place] 04/06/21     Abbreviations: M myopia (nearsighted); A astigmatism; H hyperopia (farsighted); P presbyopia; Mrx spectacle prescription;  CTL contact lenses; OD right eye; OS left eye; OU both eyes  XT exotropia; ET esotropia; PEK punctate epithelial keratitis; PEE punctate  epithelial erosions; DES dry eye syndrome; MGD meibomian gland dysfunction; ATs artificial tears; PFAT's preservative free artificial tears; [place] nuclear sclerotic cataract; PSC posterior subcapsular cataract; ERM epi-retinal membrane; PVD posterior vitreous detachment; RD retinal detachment; DM diabetes mellitus; DR diabetic retinopathy; NPDR non-proliferative diabetic retinopathy; PDR proliferative diabetic retinopathy; CSME clinically significant macular edema; DME diabetic macular edema; dbh dot blot hemorrhages; CWS cotton wool spot; POAG primary open angle glaucoma; C/D cup-to-disc ratio; HVF humphrey visual field; GVF goldmann visual field; OCT optical coherence tomography; IOP intraocular pressure; BRVO Branch retinal vein occlusion; CRVO central retinal vein occlusion; CRAO central retinal artery occlusion; BRAO branch retinal artery occlusion; RT retinal tear; SB scleral buckle; PPV pars plana vitrectomy; VH Vitreous hemorrhage; PRP panretinal laser photocoagulation; IVK intravitreal kenalog; VMT vitreomacular traction; MH Macular hole;  NVD neovascularization of the disc; NVE neovascularization elsewhere; AREDS age related eye disease study; ARMD age related macular degeneration; POAG primary open angle glaucoma; EBMD epithelial/anterior basement membrane dystrophy; ACIOL anterior chamber intraocular lens; IOL intraocular lens; PCIOL posterior chamber intraocular lens; Phaco/IOL phacoemulsification with intraocular lens placement; [place] photorefractive keratectomy; LASIK laser assisted in situ keratomileusis; HTN hypertension; DM diabetes mellitus; COPD chronic obstructive pulmonary disease

## 2021-04-06 NOTE — Assessment & Plan Note (Signed)
Will suggest cataract surgery first and thereafter we address the macular findings surgically if required

## 2021-04-18 ENCOUNTER — Encounter (INDEPENDENT_AMBULATORY_CARE_PROVIDER_SITE_OTHER): Payer: 59 | Admitting: Ophthalmology

## 2021-04-18 ENCOUNTER — Encounter (INDEPENDENT_AMBULATORY_CARE_PROVIDER_SITE_OTHER): Payer: Self-pay

## 2021-04-19 ENCOUNTER — Encounter (INDEPENDENT_AMBULATORY_CARE_PROVIDER_SITE_OTHER): Payer: 59 | Admitting: Ophthalmology

## 2021-04-25 ENCOUNTER — Other Ambulatory Visit: Payer: Self-pay

## 2021-04-25 ENCOUNTER — Encounter (INDEPENDENT_AMBULATORY_CARE_PROVIDER_SITE_OTHER): Payer: 59 | Admitting: Ophthalmology

## 2021-04-25 ENCOUNTER — Ambulatory Visit (INDEPENDENT_AMBULATORY_CARE_PROVIDER_SITE_OTHER): Payer: 59 | Admitting: Ophthalmology

## 2021-04-25 ENCOUNTER — Encounter (INDEPENDENT_AMBULATORY_CARE_PROVIDER_SITE_OTHER): Payer: Self-pay | Admitting: Ophthalmology

## 2021-04-25 DIAGNOSIS — H35371 Puckering of macula, right eye: Secondary | ICD-10-CM | POA: Diagnosis not present

## 2021-04-25 DIAGNOSIS — H348121 Central retinal vein occlusion, left eye, with retinal neovascularization: Secondary | ICD-10-CM | POA: Diagnosis not present

## 2021-04-25 NOTE — Progress Notes (Addendum)
04/25/2021     CHIEF COMPLAINT Patient presents for  Chief Complaint  Patient presents with   Retina Follow Up      HISTORY OF PRESENT ILLNESS: Jeffrey Zuniga is a 65 y.o. male who presents to the clinic today for:   HPI     Retina Follow Up   Patient presents with  Other.  In left eye.  This started 2 weeks ago.  Severity is mild.  Duration of 2 weeks.  Since onset it is stable.        Comments   2 week PRP OS. Patient states vision is stable and unchanged since last visit. Denies any new floaters or FOL.  Latanoprost qhs OD.      Last edited by Laurin Coder on 04/25/2021  9:26 AM.      Referring physician: Eppie Gibson, MD [place],  [place] [address]  HISTORICAL INFORMATION:   Selected notes from the MEDICAL RECORD NUMBER    Lab Results  Component Value Date   HGBA1C 4.8 03/03/2021     CURRENT MEDICATIONS: Current Outpatient Medications (Ophthalmic Drugs)  Medication Sig   latanoprost (XALATAN) 0.005 % ophthalmic solution Place 1 drop into the left eye at bedtime.   No current facility-administered medications for this visit. (Ophthalmic Drugs)   Current Outpatient Medications (Other)  Medication Sig   amLODipine (NORVASC) 10 MG tablet Take 1 tablet (10 mg total) by mouth daily.   Darbepoetin Alfa (ARANESP) 200 MCG/0.4ML SOSY injection Inject 0.4 mLs (200 mcg total) into the vein every Monday with hemodialysis.   ferrous sulfate 325 (65 FE) MG tablet Take 1 tablet (325 mg total) by mouth daily with breakfast.   isosorbide mononitrate (IMDUR) 60 MG 24 hr tablet Take 1 tablet (60 mg total) by mouth daily.   labetalol (NORMODYNE) 100 MG tablet Take 2 tablets (200 mg total) by mouth 2 (two) times daily.   pantoprazole (PROTONIX) 40 MG tablet Take 1 tablet (40 mg total) by mouth daily.   Sofosbuvir-Velpatasvir (EPCLUSA) 400-100 MG TABS Take 1 tablet by mouth daily.   No current facility-administered medications for this visit. (Other)       REVIEW OF SYSTEMS:    ALLERGIES No Known Allergies  PAST MEDICAL HISTORY Past Medical History:  Diagnosis Date   Acute on chronic kidney failure ([place])    Anemia 04/04/2020   Blood transfusion without reported diagnosis    Diabetes mellitus without complication ([place])    GI bleed    H. pylori infection    Hemorrhoids    Hyperplastic colon polyp    Hypertension    Past Surgical History:  Procedure Laterality Date   AV FISTULA PLACEMENT Left 02/10/2021   Procedure: LEFT UPPER EXTREMITY ARTERIOVENOUS (AV) FISTULA  CREATION;  Surgeon: Waynetta Sandy, MD;  Location: [place];  Service: Vascular;  Laterality: Left;   BIOPSY  08/31/2020   Procedure: BIOPSY;  Surgeon: Irving Copas., MD;  Location: [place];  Service: Gastroenterology;;   BIOPSY  03/06/2021   Procedure: BIOPSY;  Surgeon: Jerene Bears, MD;  Location[facility] ENDOSCOPY;  Service: Gastroenterology;;   CARDIAC CATHETERIZATION     COLONOSCOPY WITH PROPOFOL N/A 08/31/2020   Procedure: COLONOSCOPY WITH PROPOFOL;  Surgeon: Irving Copas., MD;  Location: [place];  Service: Gastroenterology;  Laterality: N/A;   ENTEROSCOPY N/A 03/06/2021   Procedure: ENTEROSCOPY;  Surgeon: Jerene Bears, MD;  Location: [facility] ENDOSCOPY;  Service: Gastroenterology;  Laterality: N/A;   ESOPHAGOGASTRODUODENOSCOPY (  EGD) WITH PROPOFOL N/A 08/31/2020   Procedure: ESOPHAGOGASTRODUODENOSCOPY (EGD) WITH PROPOFOL;  Surgeon: Rush Landmark Telford Nab., MD;  Location: [place];  Service: Gastroenterology;  Laterality: N/A;   GIVENS CAPSULE STUDY N/A 03/04/2021   Procedure: GIVENS CAPSULE STUDY;  Surgeon: Gatha Mayer, MD;  Location: [place];  Service: Endoscopy;  Laterality: N/A;   HEMOSTASIS CLIP PLACEMENT  03/06/2021   Procedure: HEMOSTASIS CLIP PLACEMENT;  Surgeon: Jerene Bears, MD;  Location: [place] ENDOSCOPY;  Service: Gastroenterology;;   HOT HEMOSTASIS N/A 03/06/2021   Procedure: HOT HEMOSTASIS (ARGON PLASMA  COAGULATION/BICAP);  Surgeon: Jerene Bears, MD;  Location: [facility] ENDOSCOPY;  Service: Gastroenterology;  Laterality: N/A;   IR FLUORO GUIDE CV LINE RIGHT  02/09/2021   IR US GUIDE VASC ACCESS RIGHT  02/09/2021   POLYPECTOMY  08/31/2020   Procedure: POLYPECTOMY;  Surgeon: Mansouraty, Telford Nab., MD;  Location: [facility] ENDOSCOPY;  Service: Gastroenterology;;    FAMILY HISTORY Family History  Problem Relation Age of Onset   Liver disease Neg Hx    Liver cancer Neg Hx     SOCIAL HISTORY Social History   Tobacco Use   Smoking status: Every Day    Packs/day: 0.25    Types: Cigarettes   Smokeless tobacco: Never   Tobacco comments:    2021  " i AM CUTTING BACK :"  Vaping Use   Vaping Use: Never used  Substance Use Topics   Alcohol use: Not Currently   Drug use: Never         OPHTHALMIC EXAM:  Base Eye Exam     Visual Acuity (ETDRS)       Right Left   Dist cc 20/80 +2 NLP   Dist ph cc NI     Correction: Glasses         Tonometry (Tonopen, 9:30 AM)       Right Left   Pressure 18 19         Pupils       Dark Light Shape React APD   Right 3 2 Round Brisk None   Left 4 4 Round Minimal +1         Visual Fields (Counting fingers)       Left Right     Full   Restrictions Total superior temporal, inferior temporal, superior nasal, inferior nasal deficiencies          Extraocular Movement       Right Left    Full Full         Neuro/Psych     Oriented x3: Yes   Mood/Affect: Normal         Dilation     Left eye: 1.0% Mydriacyl, 2.5% Phenylephrine @ 9:30 AM           Slit Lamp and Fundus Exam     External Exam       Right Left   External Normal Normal         Slit Lamp Exam       Right Left   Lids/Lashes Normal Normal   Conjunctiva/Sclera White and quiet White and quiet   Cornea Clear Clear   Anterior Chamber Deep and quiet Deep and quiet   Iris Round and reactive Round and reactive   Lens 2+ Nuclear sclerosis, 2+ Cortical  cataract, 3+ Posterior subcapsular cataract Clear   Anterior Vitreous Normal Normal         Fundus Exam       Right Left   Disc  4+ Pallor   C/D Ratio  1.0   Macula  Nonperfused   Vessels  White lines of peripheral retinal nonperfusion with scattered peripheral retinal hemorrhages.  Large dot blot hemorrhages mid periphery and anterior   Periphery  Avascular retina.  May need peripheral PRP 1-Day simply to prevent progression to further neovascular changes in the left eye            IMAGING AND PROCEDURES  Imaging and Procedures for 04/25/21  Panretinal Photocoagulation - OS - Left Eye       Time Out Confirmed correct patient, procedure, site, and patient consented.   Anesthesia Topical anesthesia was used. Anesthetic medications included Proparacaine 0.5%.   Laser Information The type of laser was diode. Color was yellow. The duration in seconds was 0.03. The spot size was 390 microns. Laser power was 240. Total spots was 960.   Post-op The patient tolerated the procedure well. There were no complications. The patient received written and verbal post procedure care education.   Notes Superior one third of retina treated             ASSESSMENT/PLAN:  Central retinal vein occlusion, left eye, with retinal neovascularization PRP #1 completed superiorly OS today, to prevent ongoing neovascular stimulus OS to salvage the globe bed uncomfortable neovascular glaucomatous progression     ICD-10-CM   1. Macular pucker, right eye  H35.371 Panretinal Photocoagulation - OS - Left Eye    2. Central retinal vein occlusion, left eye, with retinal neovascularization  H34.8121       1.  Repeat 1 OS today delivered to prevent progression of neovascular disease to neovascular glaucoma and loss of eye and discomfort developing  2.  OD with media opacity we will continue to monitor  3.  Ophthalmic Meds Ordered this visit:  No orders of the defined types were placed in  this encounter.      Return in 3 weeks (on 05/16/2021) for DILATE OU, COLOR FP, OCT, PRP #2 , OS.  There are no Patient Instructions on file for this visit.   Explained the diagnoses, plan, and follow up with the patient and they expressed understanding.  Patient expressed understanding of the importance of proper follow up care.   Clent Demark Maretta Overdorf M.D. Diseases & Surgery of the Retina and Vitreous Retina & Diabetic [place] 04/25/21     Abbreviations: M myopia (nearsighted); A astigmatism; H hyperopia (farsighted); P presbyopia; Mrx spectacle prescription;  CTL contact lenses; OD right eye; OS left eye; OU both eyes  XT exotropia; ET esotropia; PEK punctate epithelial keratitis; PEE punctate epithelial erosions; DES dry eye syndrome; MGD meibomian gland dysfunction; ATs artificial tears; PFAT's preservative free artificial tears; [place] nuclear sclerotic cataract; PSC posterior subcapsular cataract; ERM epi-retinal membrane; PVD posterior vitreous detachment; RD retinal detachment; DM diabetes mellitus; DR diabetic retinopathy; NPDR non-proliferative diabetic retinopathy; PDR proliferative diabetic retinopathy; CSME clinically significant macular edema; DME diabetic macular edema; dbh dot blot hemorrhages; CWS cotton wool spot; POAG primary open angle glaucoma; C/D cup-to-disc ratio; HVF humphrey visual field; GVF goldmann visual field; OCT optical coherence tomography; IOP intraocular pressure; BRVO Branch retinal vein occlusion; CRVO central retinal vein occlusion; CRAO central retinal artery occlusion; BRAO branch retinal artery occlusion; RT retinal tear; SB scleral buckle; PPV pars plana vitrectomy; VH Vitreous hemorrhage; PRP panretinal laser photocoagulation; IVK intravitreal kenalog; VMT vitreomacular traction; MH Macular hole;  NVD neovascularization of the disc; NVE neovascularization elsewhere; AREDS age related eye disease study; ARMD age related  macular degeneration; POAG primary open  angle glaucoma; EBMD epithelial/anterior basement membrane dystrophy; ACIOL anterior chamber intraocular lens; IOL intraocular lens; PCIOL posterior chamber intraocular lens; Phaco/IOL phacoemulsification with intraocular lens placement; [place] photorefractive keratectomy; LASIK laser assisted in situ keratomileusis; HTN hypertension; DM diabetes mellitus; COPD chronic obstructive pulmonary disease

## 2021-04-25 NOTE — Assessment & Plan Note (Addendum)
PRP #1 completed superiorly OS today, to prevent ongoing neovascular stimulus OS to salvage the globe bed uncomfortable neovascular glaucomatous progression

## 2021-04-27 ENCOUNTER — Encounter (INDEPENDENT_AMBULATORY_CARE_PROVIDER_SITE_OTHER): Payer: 59 | Admitting: Ophthalmology

## 2021-04-27 ENCOUNTER — Ambulatory Visit: Payer: 59 | Admitting: Pharmacist

## 2021-05-01 ENCOUNTER — Encounter (INDEPENDENT_AMBULATORY_CARE_PROVIDER_SITE_OTHER): Payer: 59 | Admitting: Ophthalmology

## 2021-05-16 ENCOUNTER — Ambulatory Visit (INDEPENDENT_AMBULATORY_CARE_PROVIDER_SITE_OTHER): Payer: 59 | Admitting: Ophthalmology

## 2021-05-16 ENCOUNTER — Encounter (INDEPENDENT_AMBULATORY_CARE_PROVIDER_SITE_OTHER): Payer: Self-pay | Admitting: Ophthalmology

## 2021-05-16 ENCOUNTER — Other Ambulatory Visit: Payer: Self-pay

## 2021-05-16 ENCOUNTER — Encounter (INDEPENDENT_AMBULATORY_CARE_PROVIDER_SITE_OTHER): Payer: 59 | Admitting: Ophthalmology

## 2021-05-16 DIAGNOSIS — E113411 Type 2 diabetes mellitus with severe nonproliferative diabetic retinopathy with macular edema, right eye: Secondary | ICD-10-CM | POA: Diagnosis not present

## 2021-05-16 DIAGNOSIS — H35371 Puckering of macula, right eye: Secondary | ICD-10-CM

## 2021-05-16 DIAGNOSIS — H2511 Age-related nuclear cataract, right eye: Secondary | ICD-10-CM | POA: Diagnosis not present

## 2021-05-16 DIAGNOSIS — H348121 Central retinal vein occlusion, left eye, with retinal neovascularization: Secondary | ICD-10-CM

## 2021-05-16 NOTE — Assessment & Plan Note (Signed)
Severe epiretinal membrane requires intervention but needs cataract surgery performed first

## 2021-05-16 NOTE — Progress Notes (Signed)
05/16/2021     CHIEF COMPLAINT Patient presents for  Chief Complaint  Patient presents with   Retina Follow Up      HISTORY OF PRESENT ILLNESS: Jeffrey Zuniga is a 65 y.o. male who presents to the clinic today for:   HPI     Retina Follow Up   Patient presents with  Other.  In left eye.  This started 3 weeks ago.  Severity is mild.  Duration of 3 weeks.  Since onset it is stable.        Comments   3 week FU OU oct FP PRP OS #2. Patient states vision is stable and unchanged since last visit. Denies any new floaters or FOL. Pt states he has cataract surgery for right eye on 05/25/2021. Pt uses latanoprost OS qhs.      Last edited by Laurin Coder on 05/16/2021 10:10 AM.      Referring physician: Eppie Gibson, MD [place],  [place] [id number]  HISTORICAL INFORMATION:   Selected notes from the MEDICAL RECORD NUMBER    Lab Results  Component Value Date   HGBA1C 4.8 03/03/2021     CURRENT MEDICATIONS: Current Outpatient Medications (Ophthalmic Drugs)  Medication Sig   latanoprost (XALATAN) 0.005 % ophthalmic solution Place 1 drop into the left eye at bedtime.   No current facility-administered medications for this visit. (Ophthalmic Drugs)   Current Outpatient Medications (Other)  Medication Sig   amLODipine (NORVASC) 10 MG tablet Take 1 tablet (10 mg total) by mouth daily.   Darbepoetin Alfa (ARANESP) 200 MCG/0.4ML SOSY injection Inject 0.4 mLs (200 mcg total) into the vein every Monday with hemodialysis.   ferrous sulfate 325 (65 FE) MG tablet Take 1 tablet (325 mg total) by mouth daily with breakfast.   isosorbide mononitrate (IMDUR) 60 MG 24 hr tablet Take 1 tablet (60 mg total) by mouth daily.   labetalol (NORMODYNE) 100 MG tablet Take 2 tablets (200 mg total) by mouth 2 (two) times daily.   pantoprazole (PROTONIX) 40 MG tablet Take 1 tablet (40 mg total) by mouth daily.   Sofosbuvir-Velpatasvir (EPCLUSA) 400-100 MG TABS Take 1 tablet by  mouth daily.   No current facility-administered medications for this visit. (Other)      REVIEW OF SYSTEMS:    ALLERGIES No Known Allergies  PAST MEDICAL HISTORY Past Medical History:  Diagnosis Date   Acute on chronic kidney failure ([place])    Anemia 04/04/2020   Blood transfusion without reported diagnosis    Diabetes mellitus without complication ([place])    GI bleed    H. pylori infection    Hemorrhoids    Hyperplastic colon polyp    Hypertension    Past Surgical History:  Procedure Laterality Date   AV FISTULA PLACEMENT Left 02/10/2021   Procedure: LEFT UPPER EXTREMITY ARTERIOVENOUS (AV) FISTULA  CREATION;  Surgeon: Waynetta Sandy, MD;  Location: [place];  Service: Vascular;  Laterality: Left;   BIOPSY  08/31/2020   Procedure: BIOPSY;  Surgeon: Irving Copas., MD;  Location: [place];  Service: Gastroenterology;;   BIOPSY  03/06/2021   Procedure: BIOPSY;  Surgeon: Jerene Bears, MD;  Location: [facility] ENDOSCOPY;  Service: Gastroenterology;;   CARDIAC CATHETERIZATION     COLONOSCOPY WITH PROPOFOL N/A 08/31/2020   Procedure: COLONOSCOPY WITH PROPOFOL;  Surgeon: Irving Copas., MD;  Location: [place];  Service: Gastroenterology;  Laterality: N/A;   ENTEROSCOPY N/A 03/06/2021   Procedure: ENTEROSCOPY;  Surgeon: Hilarie Fredrickson,  Lajuan Lines, MD;  Location: [place];  Service: Gastroenterology;  Laterality: N/A;   ESOPHAGOGASTRODUODENOSCOPY (EGD) WITH PROPOFOL N/A 08/31/2020   Procedure: ESOPHAGOGASTRODUODENOSCOPY (EGD) WITH PROPOFOL;  Surgeon: Rush Landmark Telford Nab., MD;  Location: [place];  Service: Gastroenterology;  Laterality: N/A;   GIVENS CAPSULE STUDY N/A 03/04/2021   Procedure: GIVENS CAPSULE STUDY;  Surgeon: Gatha Mayer, MD;  Location: [place];  Service: Endoscopy;  Laterality: N/A;   HEMOSTASIS CLIP PLACEMENT  03/06/2021   Procedure: HEMOSTASIS CLIP PLACEMENT;  Surgeon: Jerene Bears, MD;  Location: [place] ENDOSCOPY;  Service: Gastroenterology;;    HOT HEMOSTASIS N/A 03/06/2021   Procedure: HOT HEMOSTASIS (ARGON PLASMA COAGULATION/BICAP);  Surgeon: Jerene Bears, MD;  Location: [facility] ENDOSCOPY;  Service: Gastroenterology;  Laterality: N/A;   IR FLUORO GUIDE CV LINE RIGHT  02/09/2021   IR US GUIDE VASC ACCESS RIGHT  02/09/2021   POLYPECTOMY  08/31/2020   Procedure: POLYPECTOMY;  Surgeon: Mansouraty, Telford Nab., MD;  Location: [facility] ENDOSCOPY;  Service: Gastroenterology;;    FAMILY HISTORY Family History  Problem Relation Age of Onset   Liver disease Neg Hx    Liver cancer Neg Hx     SOCIAL HISTORY Social History   Tobacco Use   Smoking status: Every Day    Packs/day: 0.25    Types: Cigarettes   Smokeless tobacco: Never   Tobacco comments:    2021  " i AM CUTTING BACK :"  Vaping Use   Vaping Use: Never used  Substance Use Topics   Alcohol use: Not Currently   Drug use: Never         OPHTHALMIC EXAM:  Base Eye Exam     Visual Acuity (ETDRS)       Right Left   Dist [place] 20/80 -1 NLP   Dist ph [place] NI          Tonometry (Tonopen, 10:14 AM)       Right Left   Pressure 13 20         Pupils       Pupils Dark Light Shape React APD   Right PERRL 3 2 Round Brisk None   Left PERRL 4 4 Round Minimal None         Extraocular Movement       Right Left    Full Full         Neuro/Psych     Oriented x3: Yes   Mood/Affect: Normal         Dilation     Both eyes: 1.0% Mydriacyl, 2.5% Phenylephrine @ 10:14 AM           Slit Lamp and Fundus Exam     External Exam       Right Left   External Normal Normal         Slit Lamp Exam       Right Left   Lids/Lashes Normal Normal   Conjunctiva/Sclera White and quiet White and quiet   Cornea Clear Clear   Anterior Chamber Deep and quiet Deep and quiet   Iris Round and reactive Round and reactive   Lens 2+ Nuclear sclerosis, 2+ Cortical cataract, 3+ Posterior subcapsular cataract Clear   Anterior Vitreous Normal Normal         Fundus Exam        Right Left   Disc  4+ Pallor   C/D Ratio  1.0   Macula  Nonperfused   Vessels  White lines of peripheral retinal nonperfusion with  scattered peripheral retinal hemorrhages.  Large dot blot hemorrhages mid periphery and anterior   Periphery  Avascular retina.  May need peripheral PRP 1-Day simply to prevent progression to further neovascular changes in the left eye            IMAGING AND PROCEDURES  Imaging and Procedures for 05/16/21  Color Fundus Photography Optos - OU - Both Eyes       Right Eye Progression has no prior data.   Left Eye Progression has no prior data. Disc findings include increased cup to disc ratio, pallor, thinning of rim.   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  No clear peripheral retinal detail seen  OS with clear media white lines of retinal nonperfusion peripherally with a totally cupped left eye coincident with no light perception vision, good PRP superior and inferior for completion     Panretinal Photocoagulation - OS - Left Eye       Time Out Confirmed correct patient, procedure, site, and patient consented.   Anesthesia Topical anesthesia was used. Anesthetic medications included Proparacaine 0.5%.   Laser Information The type of laser was diode. Color was yellow. The duration in seconds was 0.03. The spot size was 390 microns. Laser power was 280. Total spots was 784.   Post-op The patient tolerated the procedure well. There were no complications. The patient received written and verbal post procedure care education.   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 Central retinal vein occlusion, left eye, with retinal neovascularization  H34.8121 Color Fundus Photography Optos - OU - Both Eyes    Panretinal Photocoagulation - OS - Left Eye    2. Nuclear sclerotic cataract of right eye  H25.11     3. Macular pucker, right eye  H35.371     4. 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  OS completion of PRP today to prevent neovascular glaucoma develop  2.  OD, scheduled for cataract surgery with surgeon of unknown name per patient.  This should occur the next 2 weeks or so he says.  3.  Follow-up here in 3 to 5 weeks for evaluation of the right eye epiretinal membrane and likely consideration for surgical intervention right eye  Ophthalmic Meds Ordered this visit:  No orders of the defined types were placed in this encounter.      Return in about 5 weeks (around 06/20/2021) for dilate, OD, OCT.  There are no Patient Instructions on file for this visit.   Explained the diagnoses, plan, and follow up with the patient and they expressed understanding.  Patient expressed understanding of the importance of proper follow up care.   Clent Demark Kamilo Och M.D. Diseases & Surgery of the Retina and Vitreous Retina & Diabetic [place] 05/16/21  Abbreviations: M myopia (nearsighted); A astigmatism; H hyperopia (farsighted); P presbyopia; Mrx spectacle prescription;  CTL contact lenses; OD right eye; OS left eye; OU both eyes  XT exotropia; ET esotropia; PEK punctate epithelial keratitis; PEE punctate epithelial erosions; DES dry eye syndrome; MGD meibomian gland dysfunction; ATs artificial tears;  PFAT's preservative free artificial tears; [place] nuclear sclerotic cataract; PSC posterior subcapsular cataract; ERM epi-retinal membrane; PVD posterior vitreous detachment; RD retinal detachment; DM diabetes mellitus; DR diabetic retinopathy; NPDR non-proliferative diabetic retinopathy; PDR proliferative diabetic retinopathy; CSME clinically significant macular edema; DME diabetic macular edema; dbh dot blot hemorrhages; CWS cotton wool spot; POAG primary open angle glaucoma; C/D cup-to-disc ratio; HVF humphrey visual field; GVF goldmann visual field; OCT optical coherence tomography; IOP intraocular pressure; BRVO Branch retinal vein occlusion; CRVO central retinal vein occlusion; CRAO central retinal artery occlusion; BRAO branch retinal artery occlusion; RT retinal tear; SB scleral buckle; PPV pars plana vitrectomy; VH Vitreous hemorrhage; PRP panretinal laser photocoagulation; IVK intravitreal kenalog; VMT vitreomacular traction; MH Macular hole;  NVD neovascularization of the disc; NVE neovascularization elsewhere; AREDS age related eye disease study; ARMD age related macular degeneration; POAG primary open angle glaucoma; EBMD epithelial/anterior basement membrane dystrophy; ACIOL anterior chamber intraocular lens; IOL intraocular lens; PCIOL posterior chamber intraocular lens; Phaco/IOL phacoemulsification with intraocular lens placement; [place] photorefractive keratectomy; LASIK laser assisted in situ keratomileusis; HTN hypertension; DM diabetes mellitus; COPD chronic obstructive pulmonary disease

## 2021-05-16 NOTE — Assessment & Plan Note (Signed)
Patient has plans for cataract surgery within 10 days OD will schedule follow-up thereafter to evaluate the macula again

## 2021-05-16 NOTE — Assessment & Plan Note (Signed)
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

## 2021-05-31 ENCOUNTER — Other Ambulatory Visit: Payer: Self-pay

## 2021-05-31 ENCOUNTER — Emergency Department (HOSPITAL_COMMUNITY): Payer: 59

## 2021-05-31 ENCOUNTER — Observation Stay (HOSPITAL_COMMUNITY)
Admission: EM | Admit: 2021-05-31 | Discharge: 2021-06-02 | Disposition: A | Discharge status: Home / Self Care | Payer: 59 | Attending: Family Medicine | Admitting: Family Medicine

## 2021-05-31 ENCOUNTER — Encounter (HOSPITAL_COMMUNITY): Payer: Self-pay

## 2021-05-31 ENCOUNTER — Observation Stay (HOSPITAL_COMMUNITY): Payer: 59

## 2021-05-31 DIAGNOSIS — I10 Essential (primary) hypertension: Secondary | ICD-10-CM | POA: Diagnosis present

## 2021-05-31 DIAGNOSIS — Z20822 Contact with and (suspected) exposure to covid-19: Secondary | ICD-10-CM | POA: Insufficient documentation

## 2021-05-31 DIAGNOSIS — F17210 Nicotine dependence, cigarettes, uncomplicated: Secondary | ICD-10-CM | POA: Insufficient documentation

## 2021-05-31 DIAGNOSIS — Z79899 Other long term (current) drug therapy: Secondary | ICD-10-CM | POA: Insufficient documentation

## 2021-05-31 DIAGNOSIS — G9340 Encephalopathy, unspecified: Secondary | ICD-10-CM | POA: Diagnosis not present

## 2021-05-31 DIAGNOSIS — R410 Disorientation, unspecified: Secondary | ICD-10-CM | POA: Diagnosis not present

## 2021-05-31 DIAGNOSIS — E1122 Type 2 diabetes mellitus with diabetic chronic kidney disease: Secondary | ICD-10-CM | POA: Insufficient documentation

## 2021-05-31 DIAGNOSIS — D696 Thrombocytopenia, unspecified: Secondary | ICD-10-CM | POA: Diagnosis present

## 2021-05-31 DIAGNOSIS — R55 Syncope and collapse: Secondary | ICD-10-CM | POA: Diagnosis present

## 2021-05-31 DIAGNOSIS — Z992 Dependence on renal dialysis: Secondary | ICD-10-CM | POA: Insufficient documentation

## 2021-05-31 DIAGNOSIS — D500 Iron deficiency anemia secondary to blood loss (chronic): Secondary | ICD-10-CM

## 2021-05-31 DIAGNOSIS — I1A0 Resistant hypertension: Secondary | ICD-10-CM | POA: Diagnosis present

## 2021-05-31 DIAGNOSIS — I132 Hypertensive heart and chronic kidney disease with heart failure and with stage 5 chronic kidney disease, or end stage renal disease: Secondary | ICD-10-CM | POA: Diagnosis not present

## 2021-05-31 DIAGNOSIS — R464 Slowness and poor responsiveness: Secondary | ICD-10-CM | POA: Diagnosis present

## 2021-05-31 DIAGNOSIS — D631 Anemia in chronic kidney disease: Secondary | ICD-10-CM | POA: Diagnosis present

## 2021-05-31 DIAGNOSIS — N186 End stage renal disease: Secondary | ICD-10-CM | POA: Insufficient documentation

## 2021-05-31 DIAGNOSIS — I5030 Unspecified diastolic (congestive) heart failure: Secondary | ICD-10-CM | POA: Diagnosis not present

## 2021-05-31 DIAGNOSIS — N189 Chronic kidney disease, unspecified: Secondary | ICD-10-CM | POA: Diagnosis present

## 2021-05-31 DIAGNOSIS — I517 Cardiomegaly: Secondary | ICD-10-CM | POA: Diagnosis present

## 2021-05-31 DIAGNOSIS — B182 Chronic viral hepatitis C: Secondary | ICD-10-CM | POA: Diagnosis present

## 2021-05-31 LAB — PROTIME-INR
INR: 1 (ref 0.8–1.2)
Prothrombin Time: 13.6 seconds (ref 11.4–15.2)

## 2021-05-31 LAB — COMPREHENSIVE METABOLIC PANEL
ALT: 15 U/L (ref 0–44)
AST: 22 U/L (ref 15–41)
Albumin: 3.5 g/dL (ref 3.5–5.0)
Alkaline Phosphatase: 75 U/L (ref 38–126)
Anion gap: 11 (ref 5–15)
BUN: 12 mg/dL (ref 8–23)
CO2: 25 mmol/L (ref 22–32)
Calcium: 8 mg/dL — ABNORMAL LOW (ref 8.9–10.3)
Chloride: 100 mmol/L (ref 98–111)
Creatinine, Ser: 3.58 mg/dL — ABNORMAL HIGH (ref 0.61–1.24)
GFR, Estimated: 18 mL/min — ABNORMAL LOW (ref >60)
Glucose, Bld: 146 mg/dL — ABNORMAL HIGH (ref 70–99)
Potassium: 3.3 mmol/L — ABNORMAL LOW (ref 3.5–5.1)
Sodium: 136 mmol/L (ref 135–145)
Total Bilirubin: 0.5 mg/dL (ref 0.3–1.2)
Total Protein: 7.7 g/dL (ref 6.5–8.1)

## 2021-05-31 LAB — CBC
HCT: 26.4 % — ABNORMAL LOW (ref 39.0–52.0)
Hemoglobin: 8.4 g/dL — ABNORMAL LOW (ref 13.0–17.0)
MCH: 29.8 pg (ref 26.0–34.0)
MCHC: 31.8 g/dL (ref 30.0–36.0)
MCV: 93.6 fL (ref 80.0–100.0)
Platelets: 62 10*3/uL — ABNORMAL LOW (ref 150–400)
RBC: 2.82 MIL/uL — ABNORMAL LOW (ref 4.22–5.81)
RDW: 17.1 % — ABNORMAL HIGH (ref 11.5–15.5)
WBC: 6.4 10*3/uL (ref 4.0–10.5)
nRBC: 0 % (ref 0.0–0.2)

## 2021-05-31 LAB — DIFFERENTIAL
Abs Immature Granulocytes: 0.03 10*3/uL (ref 0.00–0.07)
Basophils Absolute: 0 10*3/uL (ref 0.0–0.1)
Basophils Relative: 0 %
Eosinophils Absolute: 0.1 10*3/uL (ref 0.0–0.5)
Eosinophils Relative: 2 %
Immature Granulocytes: 1 %
Lymphocytes Relative: 9 %
Lymphs Abs: 0.6 10*3/uL — ABNORMAL LOW (ref 0.7–4.0)
Monocytes Absolute: 0.5 10*3/uL (ref 0.1–1.0)
Monocytes Relative: 7 %
Neutro Abs: 5.2 10*3/uL (ref 1.7–7.7)
Neutrophils Relative %: 81 %
Smear Review: NORMAL

## 2021-05-31 LAB — CBG MONITORING, ED: Glucose-Capillary: 141 mg/dL — ABNORMAL HIGH (ref 70–99)

## 2021-05-31 LAB — I-STAT CHEM 8, ED
BUN: 12 mg/dL (ref 8–23)
Calcium, Ion: 0.93 mmol/L — ABNORMAL LOW (ref 1.15–1.40)
Chloride: 101 mmol/L (ref 98–111)
Creatinine, Ser: 3.8 mg/dL — ABNORMAL HIGH (ref 0.61–1.24)
Glucose, Bld: 145 mg/dL — ABNORMAL HIGH (ref 70–99)
HCT: 27 % — ABNORMAL LOW (ref 39.0–52.0)
Hemoglobin: 9.2 g/dL — ABNORMAL LOW (ref 13.0–17.0)
Potassium: 3.3 mmol/L — ABNORMAL LOW (ref 3.5–5.1)
Sodium: 139 mmol/L (ref 135–145)
TCO2: 26 mmol/L (ref 22–32)

## 2021-05-31 LAB — RESP PANEL BY RT-PCR (FLU A&B, COVID) ARPGX2
Influenza A by PCR: NEGATIVE
Influenza B by PCR: NEGATIVE
SARS Coronavirus 2 by RT PCR: NEGATIVE

## 2021-05-31 LAB — LIPID PANEL
Cholesterol: 165 mg/dL (ref 0–200)
HDL: 46 mg/dL (ref >40)
LDL Cholesterol: 105 mg/dL — ABNORMAL HIGH (ref 0–99)
Total CHOL/HDL Ratio: 3.6 RATIO
Triglycerides: 68 mg/dL (ref <150)
VLDL: 14 mg/dL (ref 0–40)

## 2021-05-31 LAB — APTT: aPTT: 30 seconds (ref 24–36)

## 2021-05-31 IMAGING — MR MR MRA NECK W/O CM
1 of 3 series · 17 of 48 positions shown · non-contrast
Comparison: Same-day noncontrast CT head

CLINICAL DATA: Neuro deficit, stroke suspected



[Series 4: sag inhance (id) · sagittal · 1.2mm · 0.47mm/px · 17 of 360 slices shown]
[im 1/360]
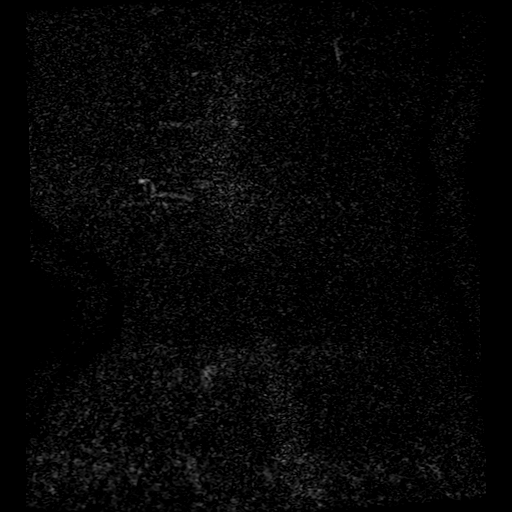
[im 12/360]
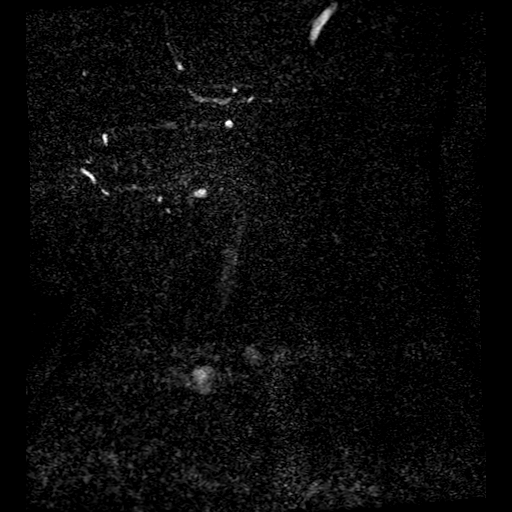
[im 23/360]
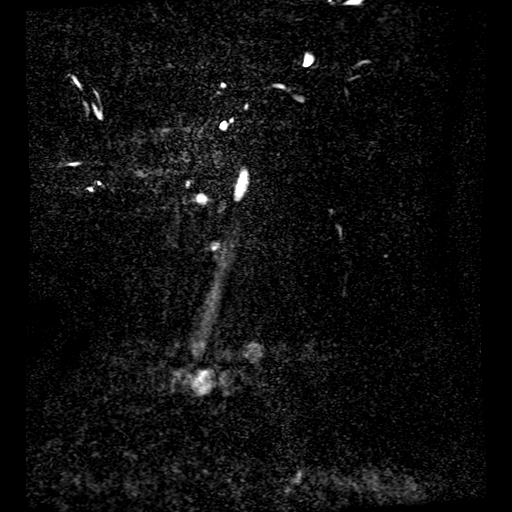
[im 34/360]
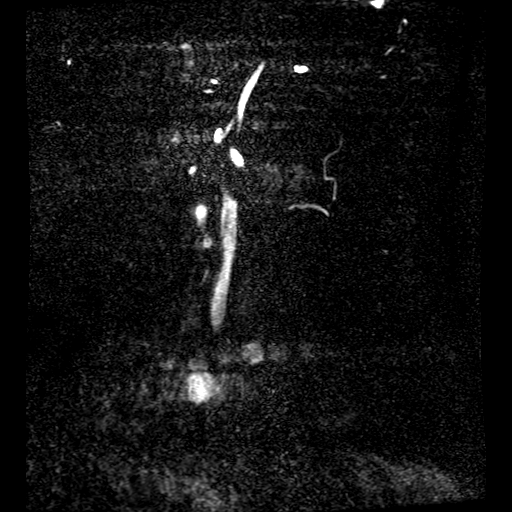
[im 45/360]
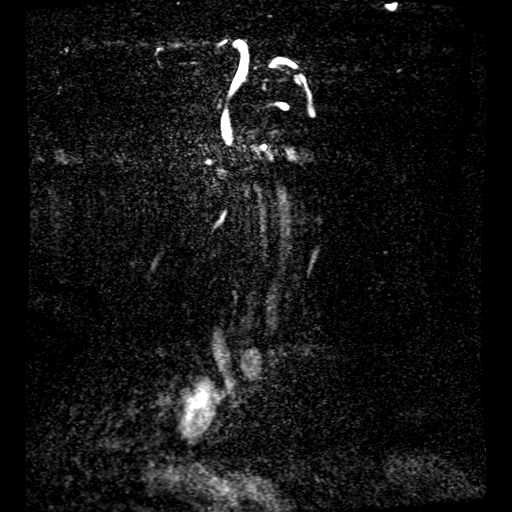
[im 57/360]
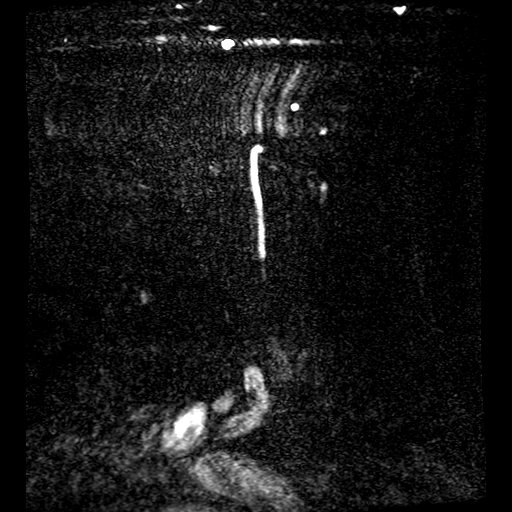
[im 68/360]
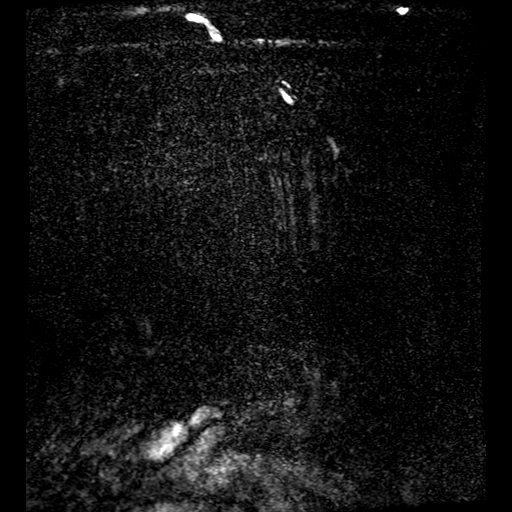
[im 79/360]
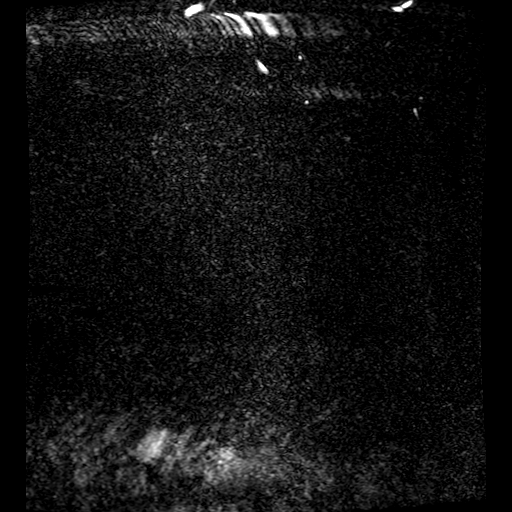
[im 90/360]
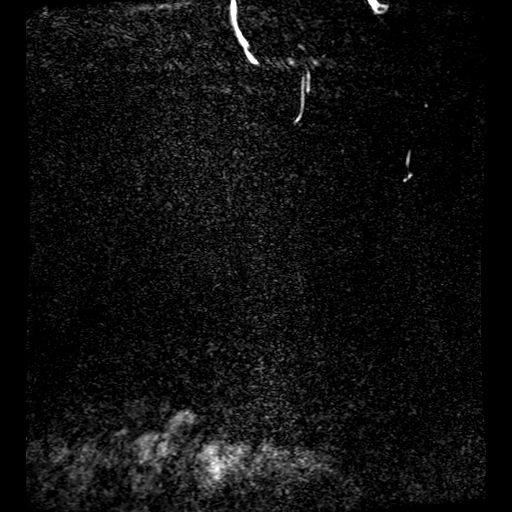
[im 113/360]
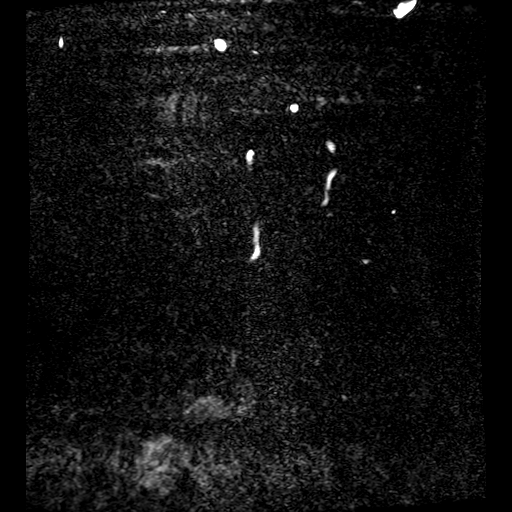
[im 158/360]
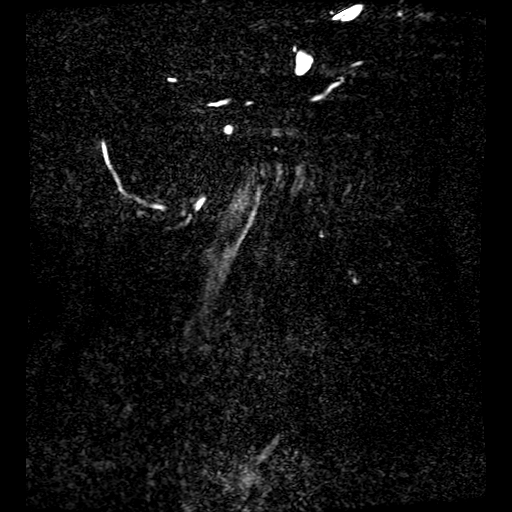
[im 180/360]
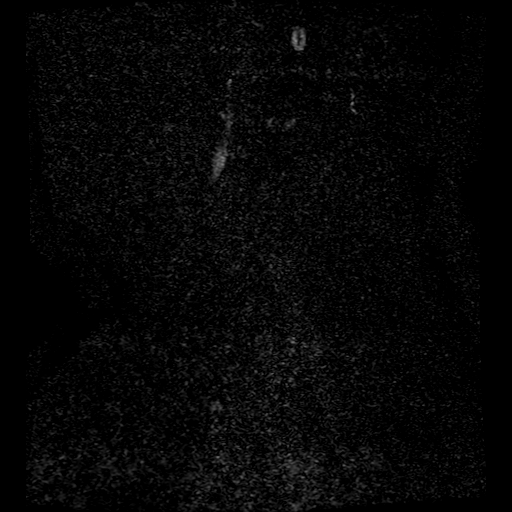
[im 202/360]
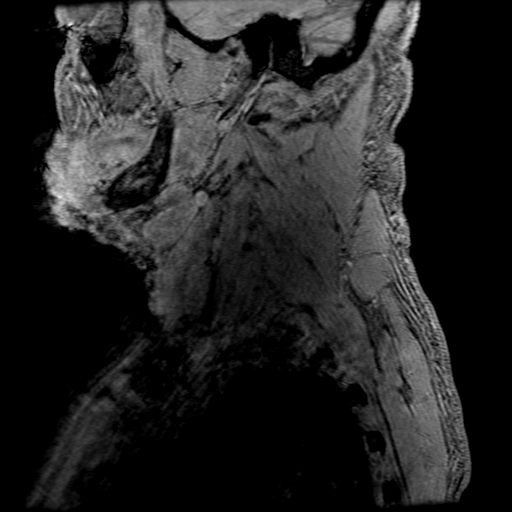
[im 247/360]
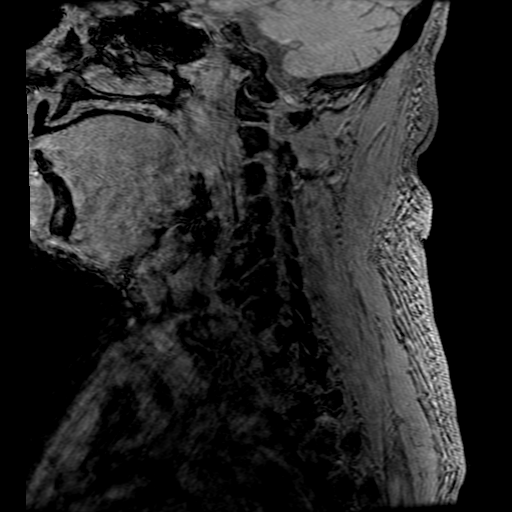
[im 292/360]
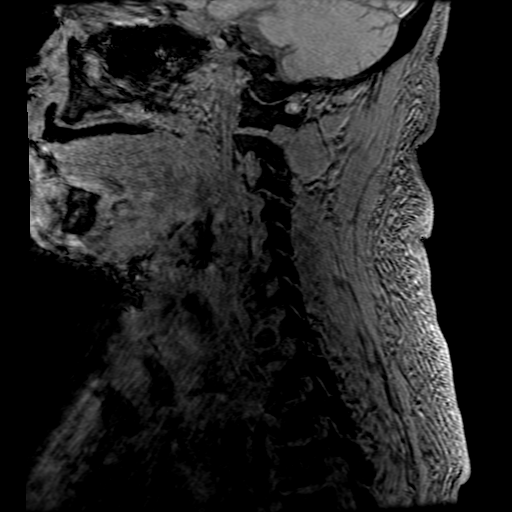
[im 303/360]
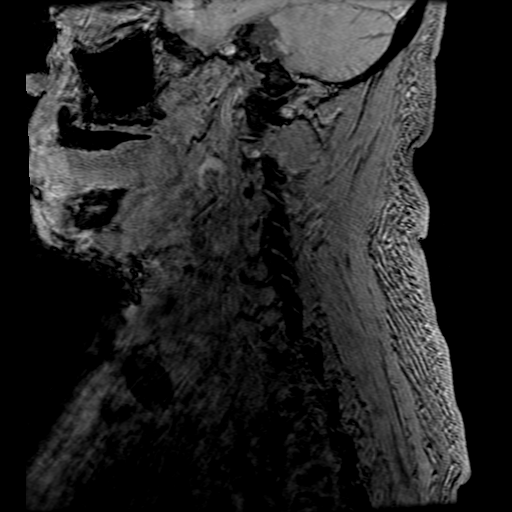
[im 337/360]
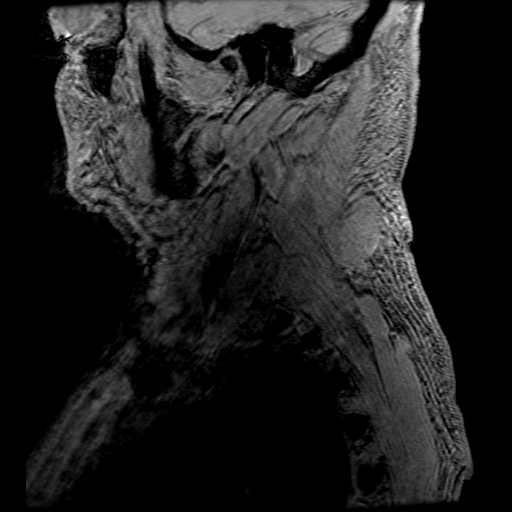

[17 of 48 positions shown; findings below may reference images not displayed]

FINDINGS: MRI HEAD FINDINGS

Brain: There is no acute intracranial hemorrhage, extra-axial fluid
collection, or acute infarct.

Parenchymal volume is normal. The ventricles are normal in size.
There is patchy and confluent FLAIR signal abnormality throughout
the subcortical and periventricular white matter likely reflecting
sequela of moderate chronic white matter microangiopathy, advanced
for age. There are small remote lacunar infarcts in the bilateral
basal ganglia and left caudate head. Additional small remote infarct
is seen in the right cerebral peduncle extending to the pons. There
are scattered punctate chronic microhemorrhages including in the
left caudate head, right lentiform nucleus, and right parietal and
occipital lobes.

There is no solid mass lesion.  There is no midline shift.

Vascular: Normal flow voids.

Skull and upper cervical spine: Normal marrow signal.

Sinuses/Orbits: There is mild mucosal thickening in the paranasal
sinuses. There is T2 hyperintensity in the left optic nerve (10-24,
10-27).

Other: None.

MRA HEAD FINDINGS

Anterior circulation: The intracranial ICAs are patent.

The bilateral MCAs are patent

There is mild focal stenosis of the left A2 segment (3-156). The
ACAs are otherwise patent. The anterior communicating artery is
patent.

There is no aneurysm.

Posterior circulation: The bilateral V4 segments are patent. The
basilar artery is patent.

The bilateral PCAs are patent. The left posterior communicating
artery is identified. The right posterior communicating artery is
not definitely seen.

There is no aneurysm.

Anatomic variants: None.

MRA NECK FINDINGS

Aortic arch: The imaged portions of the aortic arch are grossly
unremarkable, though suboptimally evaluated. The origins of the
major branch vessels are patent.

Right carotid system: Right common, internal, and external carotid
arteries are patent, without hemodynamically significant stenosis or
occlusion. There is no evidence of dissection or aneurysm.

Left carotid system: The left common, internal, and external carotid
arteries are patent, without hemodynamically significant stenosis or
occlusion. There is no evidence of dissection or aneurysm.

Vertebral arteries: Vertebral arteries are patent with antegrade
flow. There is no evidence of hemodynamically significant stenosis
or occlusion. There is no evidence of dissection or aneurysm.

Other: None
IMPRESSION: 1. No acute intracranial pathology.
2. Signal abnormality in the left optic nerve may reflect sequela of
prior optic neuritis in the absence of acute symptoms.
3. Scattered punctate chronic microhemorrhages, some of which are in
a peripheral distribution, nonspecific. Sequela of cerebral amyloid
angiopathy can not be entirely excluded.
4. Moderate chronic white matter microangiopathy, advanced for age.
5. Mild focal stenosis of the left A2 segment. Otherwise, patent
intracranial vasculature.
6. Patent vasculature of the neck.

## 2021-05-31 IMAGING — MR MR HEAD W/O CM
6 of 11 series · 24 of 48 positions shown · non-contrast
Comparison: Same-day noncontrast CT head

CLINICAL DATA: Neuro deficit, stroke suspected



[Series 3: DWI · axial · 3.0mm · 0.94mm/px · z∈[-79,+79]mm · 8 of 110 slices shown (1 of 2)]
[im 1/110]
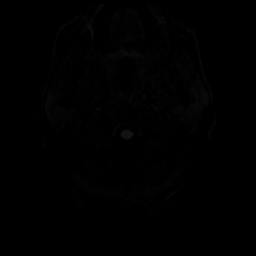
[im 16/110]
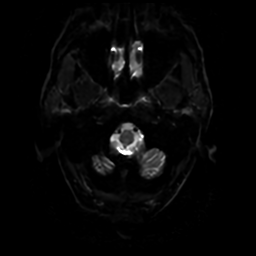
[im 32/110]
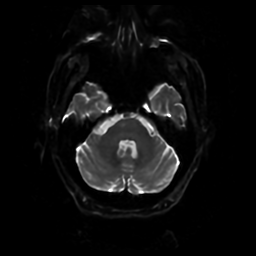
[im 47/110]
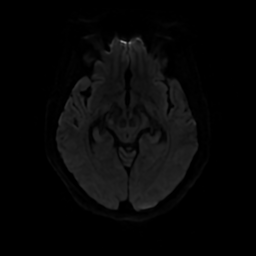
[im 63/110]
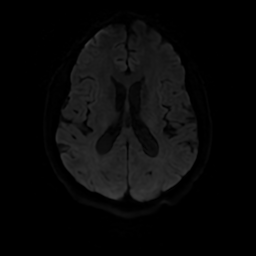
[im 78/110]
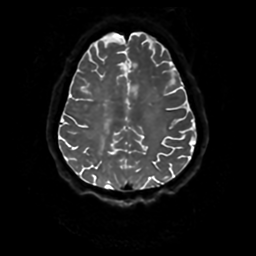
[im 94/110]
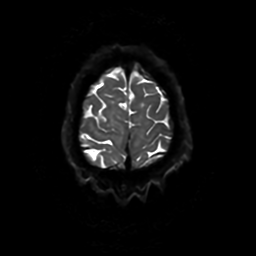
[im 110/110]
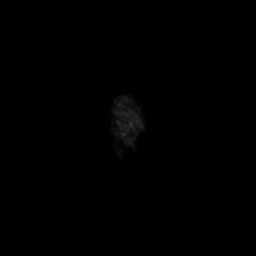

[Series 4: DWI · coronal · 4.0mm · 0.94mm/px · 5 of 76 slices shown (2 of 2)]
[im 1/76]
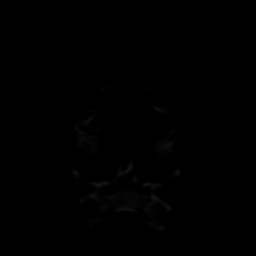
[im 19/76]
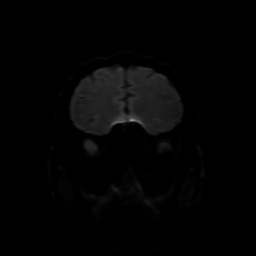
[im 38/76]
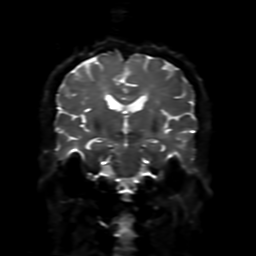
[im 57/76]
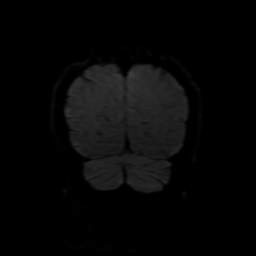
[im 76/76]
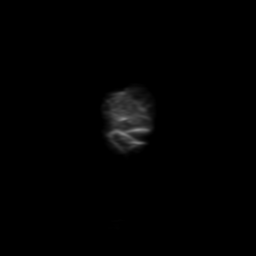

[Series 5: FLAIR · sagittal · 5.0mm · 0.23mm/px · 2 of 27 slices shown (1 of 2)]
[im 1/27]
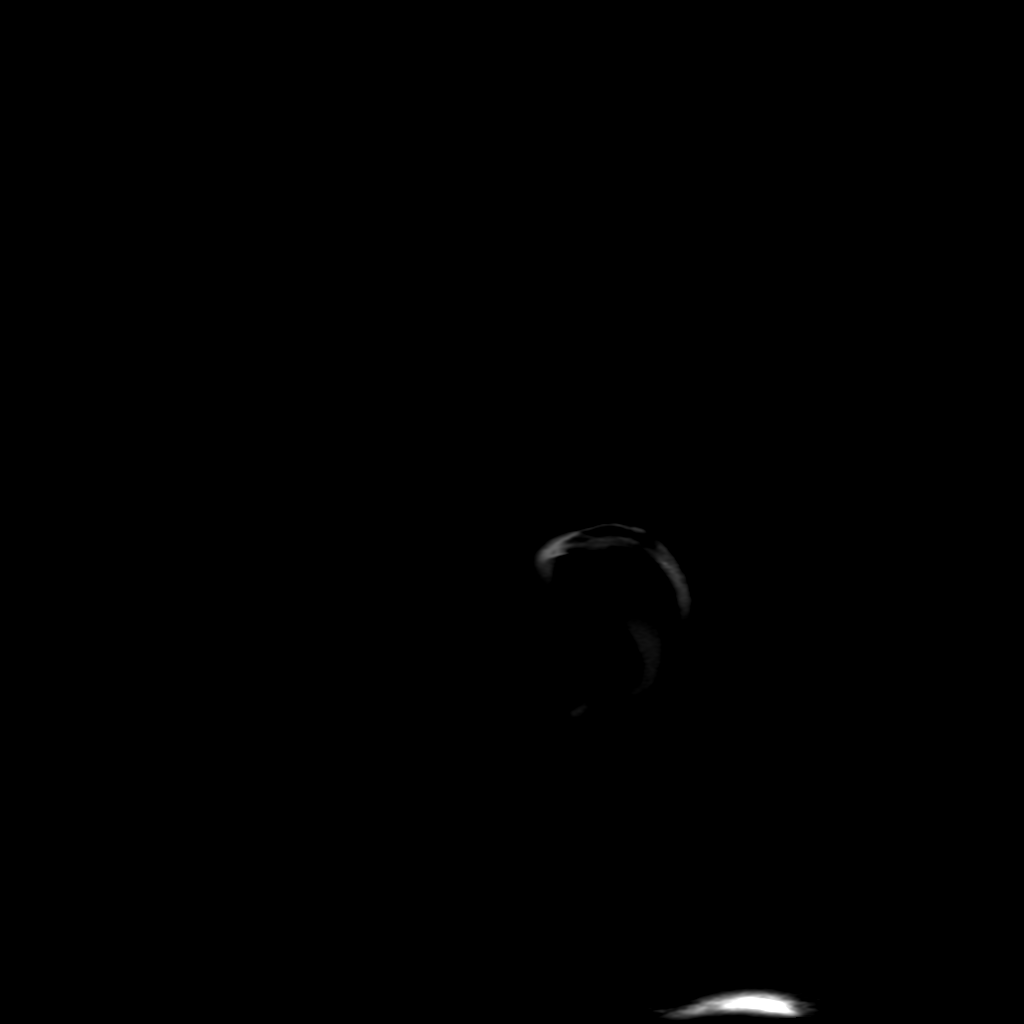
[im 27/27]
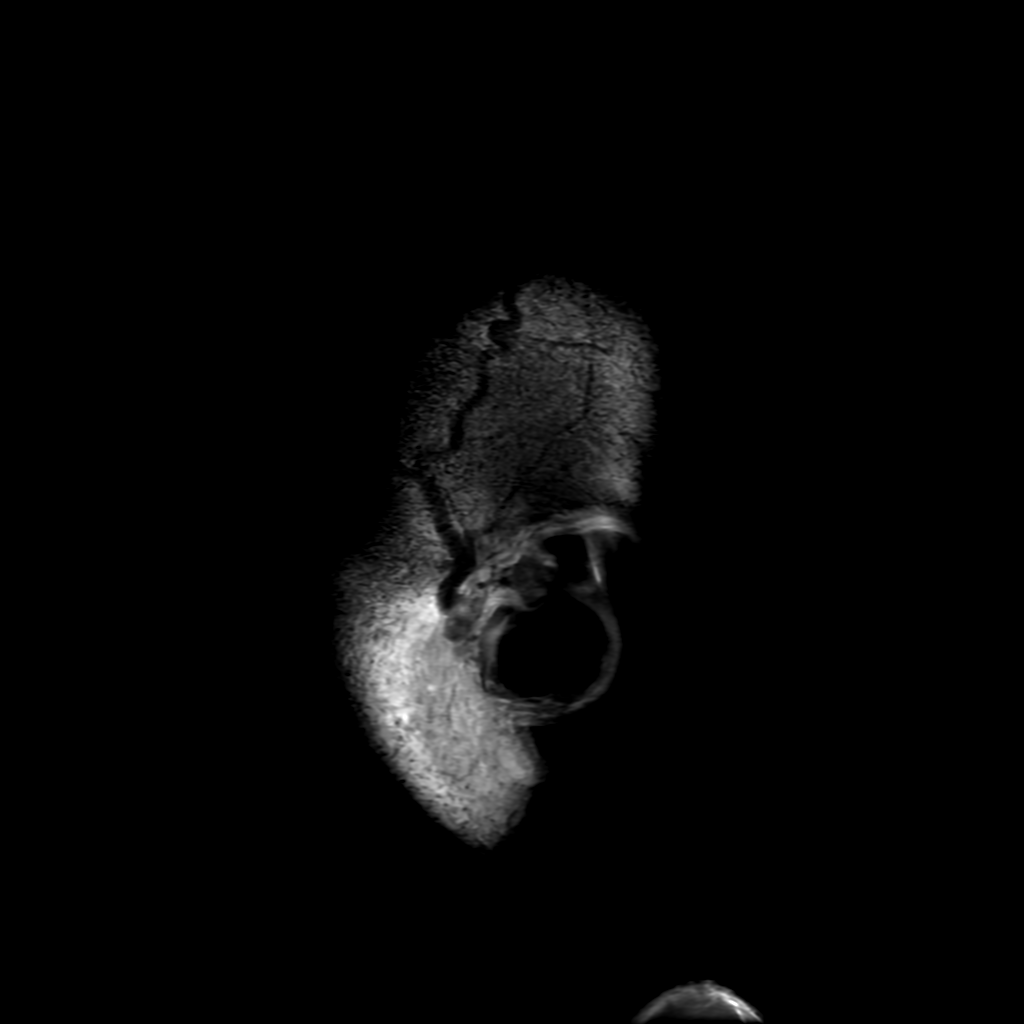

[Series 7: FLAIR · axial · 4.0mm · 0.45mm/px · z∈[-95,+69]mm · 3 of 40 slices shown (2 of 2)]
[im 1/40]
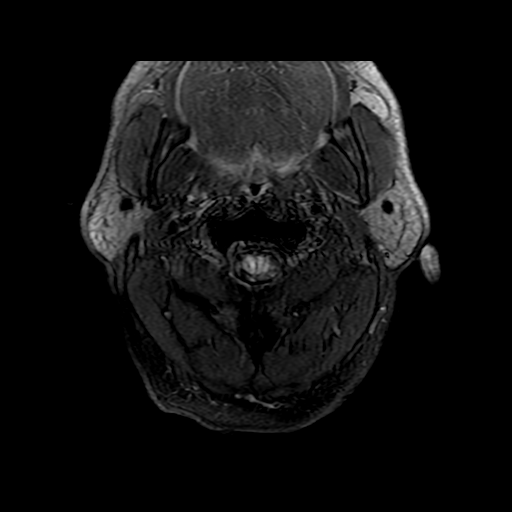
[im 20/40]
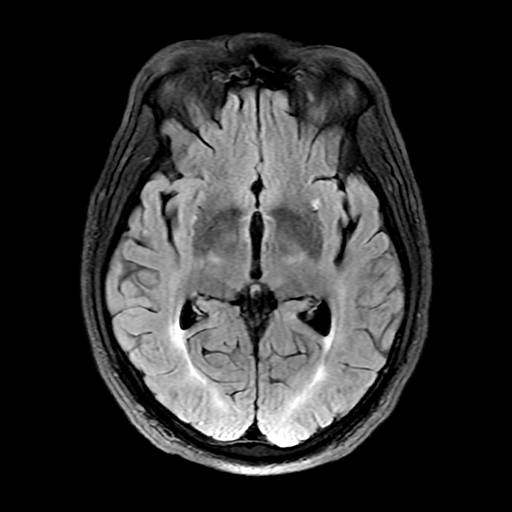
[im 40/40]
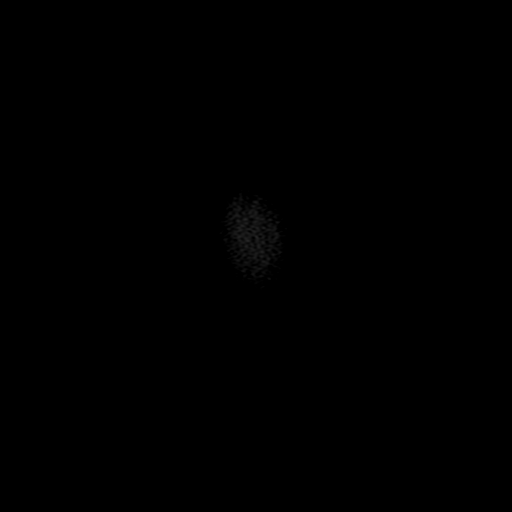

[Series 350: ADC · axial · 3.0mm · 0.94mm/px · z∈[-79,+79]mm · 4 of 55 slices shown (1 of 2)]
[im 1/55]
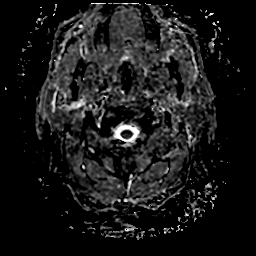
[im 19/55]
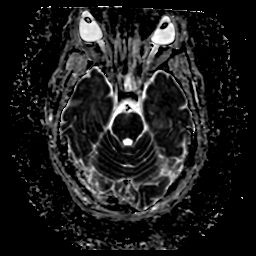
[im 37/55]
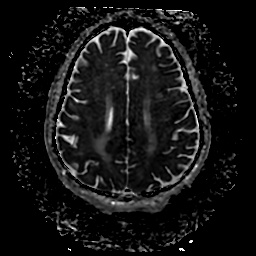
[im 55/55]
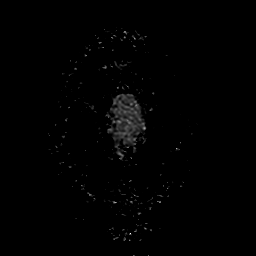

[Series 450: ADC · coronal · 4.0mm · 0.94mm/px · 2 of 33 slices shown (2 of 2)]
[im 1/33]
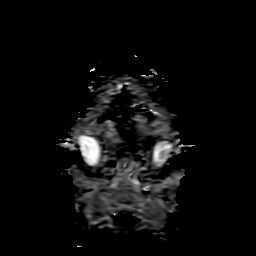
[im 33/33]
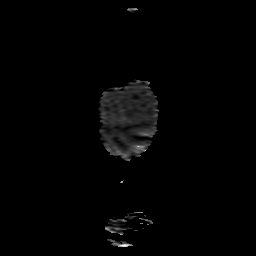

[24 of 48 positions shown; findings below may reference images not displayed]

FINDINGS: MRI HEAD FINDINGS

Brain: There is no acute intracranial hemorrhage, extra-axial fluid
collection, or acute infarct.

Parenchymal volume is normal. The ventricles are normal in size.
There is patchy and confluent FLAIR signal abnormality throughout
the subcortical and periventricular white matter likely reflecting
sequela of moderate chronic white matter microangiopathy, advanced
for age. There are small remote lacunar infarcts in the bilateral
basal ganglia and left caudate head. Additional small remote infarct
is seen in the right cerebral peduncle extending to the pons. There
are scattered punctate chronic microhemorrhages including in the
left caudate head, right lentiform nucleus, and right parietal and
occipital lobes.

There is no solid mass lesion.  There is no midline shift.

Vascular: Normal flow voids.

Skull and upper cervical spine: Normal marrow signal.

Sinuses/Orbits: There is mild mucosal thickening in the paranasal
sinuses. There is T2 hyperintensity in the left optic nerve (10-24,
10-27).

Other: None.

MRA HEAD FINDINGS

Anterior circulation: The intracranial ICAs are patent.

The bilateral MCAs are patent

There is mild focal stenosis of the left A2 segment (3-156). The
ACAs are otherwise patent. The anterior communicating artery is
patent.

There is no aneurysm.

Posterior circulation: The bilateral V4 segments are patent. The
basilar artery is patent.

The bilateral PCAs are patent. The left posterior communicating
artery is identified. The right posterior communicating artery is
not definitely seen.

There is no aneurysm.

Anatomic variants: None.

MRA NECK FINDINGS

Aortic arch: The imaged portions of the aortic arch are grossly
unremarkable, though suboptimally evaluated. The origins of the
major branch vessels are patent.

Right carotid system: Right common, internal, and external carotid
arteries are patent, without hemodynamically significant stenosis or
occlusion. There is no evidence of dissection or aneurysm.

Left carotid system: The left common, internal, and external carotid
arteries are patent, without hemodynamically significant stenosis or
occlusion. There is no evidence of dissection or aneurysm.

Vertebral arteries: Vertebral arteries are patent with antegrade
flow. There is no evidence of hemodynamically significant stenosis
or occlusion. There is no evidence of dissection or aneurysm.

Other: None
IMPRESSION: 1. No acute intracranial pathology.
2. Signal abnormality in the left optic nerve may reflect sequela of
prior optic neuritis in the absence of acute symptoms.
3. Scattered punctate chronic microhemorrhages, some of which are in
a peripheral distribution, nonspecific. Sequela of cerebral amyloid
angiopathy can not be entirely excluded.
4. Moderate chronic white matter microangiopathy, advanced for age.
5. Mild focal stenosis of the left A2 segment. Otherwise, patent
intracranial vasculature.
6. Patent vasculature of the neck.

## 2021-05-31 IMAGING — CT CT HEAD CODE STROKE
4 series · 17 of 47 positions shown, 19 images · non-contrast
Comparison: CT head [DATE].

CLINICAL DATA: Code stroke.  Neuro deficit, acute, stroke suspected

EXAM:
CT HEAD WITHOUT CONTRAST
TECHNIQUE: Contiguous axial images were obtained from the base of the skull
through the vertex without intravenous contrast.

[Series 2: head 5.0 st · axial · 0.45mm/px · z∈[-71,+49]mm · 7 of 34 slices shown, 9 images]
[im 5/34  brain]
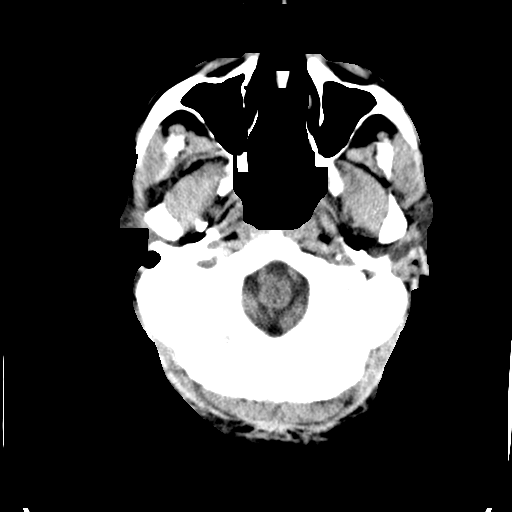
[im 5/34  bone]
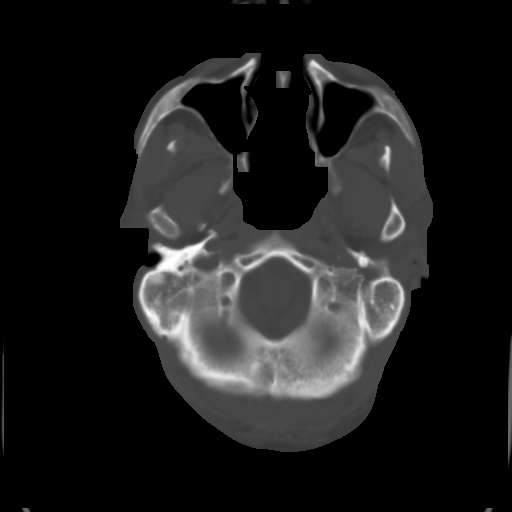
[im 9/34  brain]
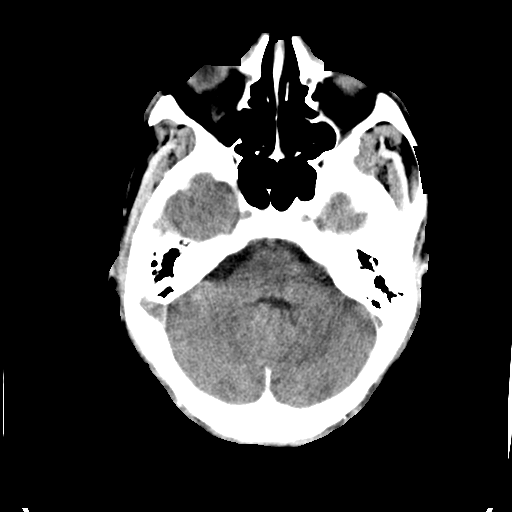
[im 13/34  brain]
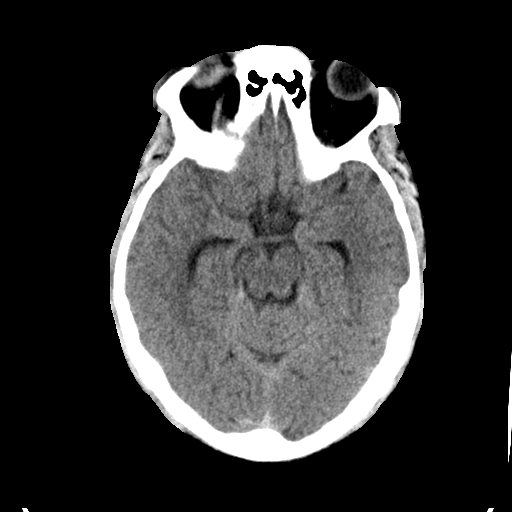
[im 17/34  brain]
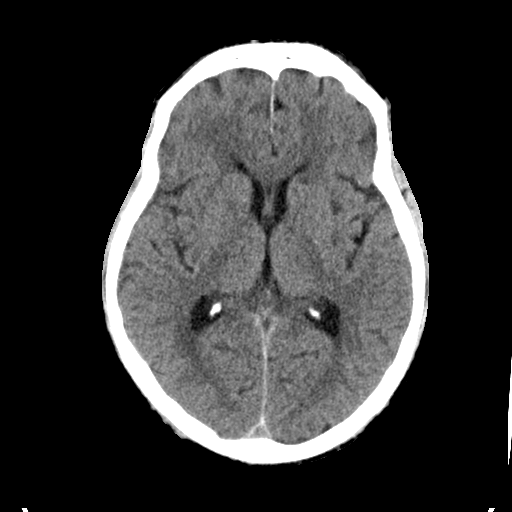
[im 21/34  brain]
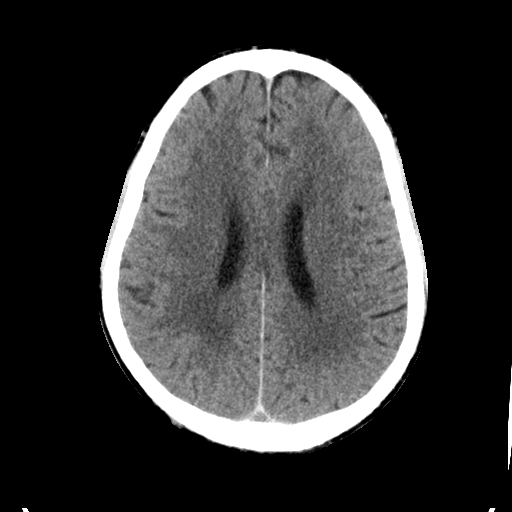
[im 21/34  bone]
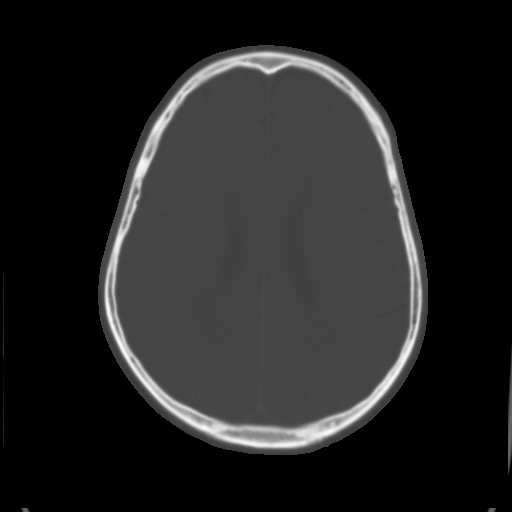
[im 25/34  brain]
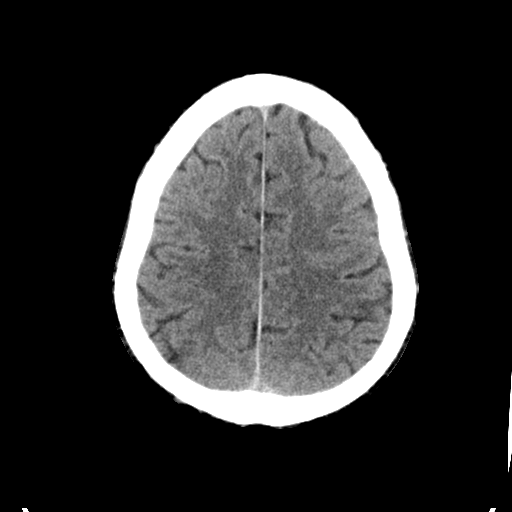
[im 29/34  brain]
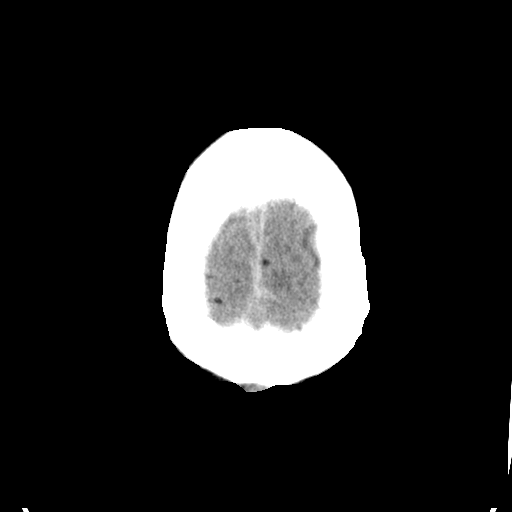

[Series 4: head 2.0 bone · axial · 0.39mm/px · z∈[-75,-17]mm · 4 of 84 slices shown]
[im 9/84  bone]
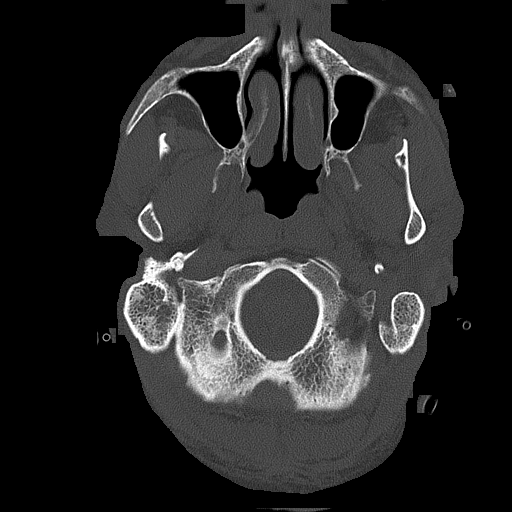
[im 17/84  bone]
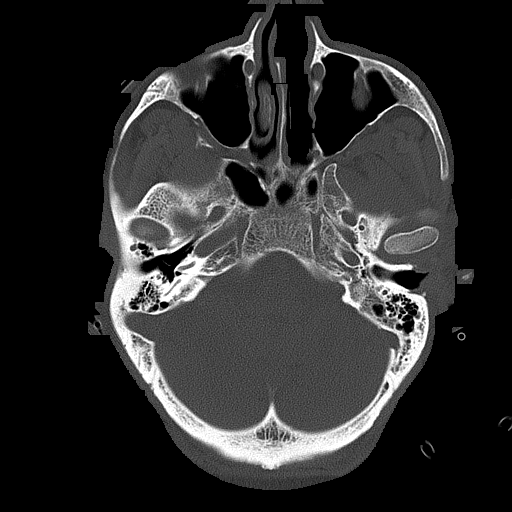
[im 25/84  bone]
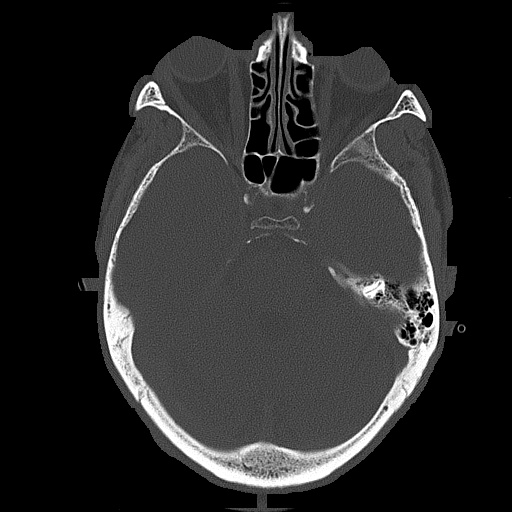
[im 38/84  bone]
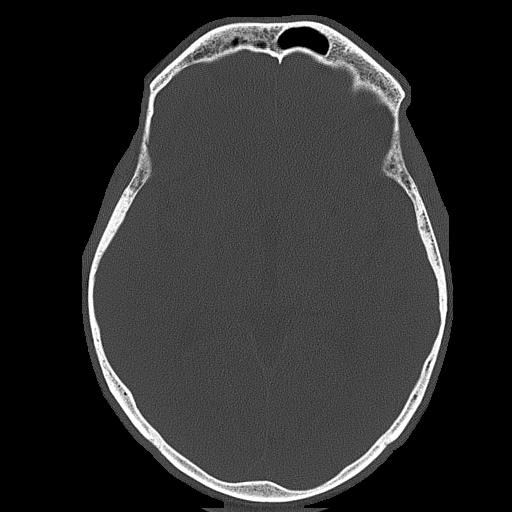

[Series 5: head 3.0 cor st · coronal · 0.34mm/px · 3 of 71 slices shown]
[im 24/71  brain]
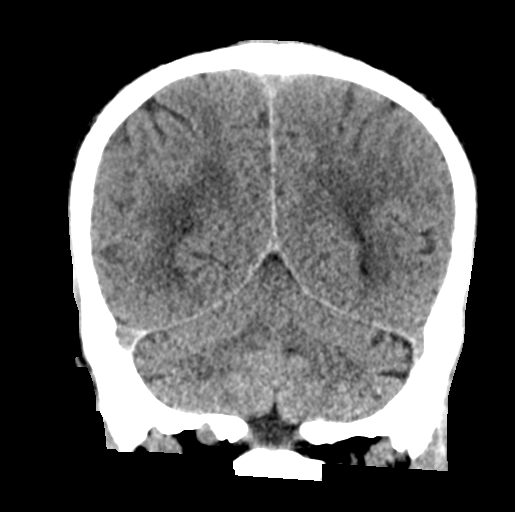
[im 32/71  brain]
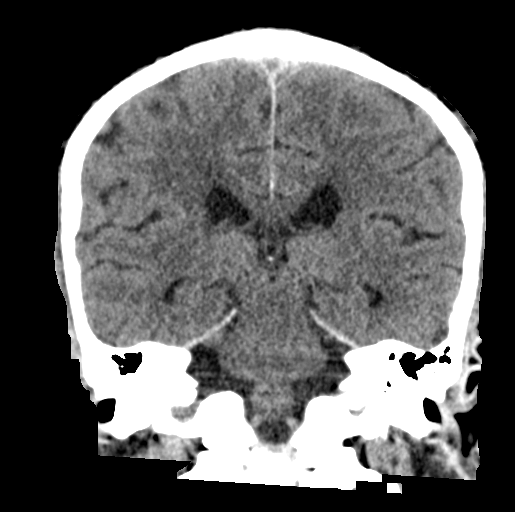
[im 39/71  brain]
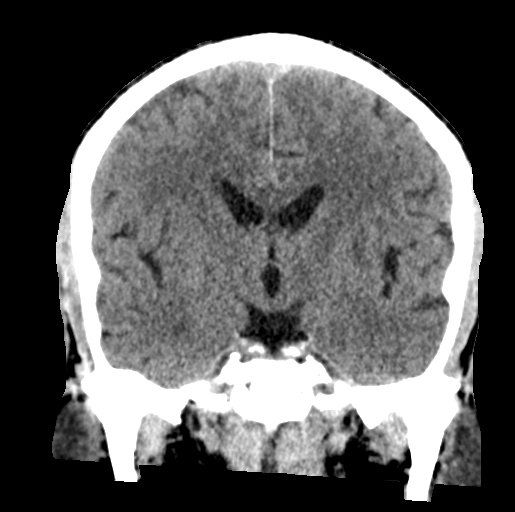

[Series 6: head 3.0 sag st · sagittal · 0.35mm/px · 3 of 58 slices shown]
[im 20/58  brain]
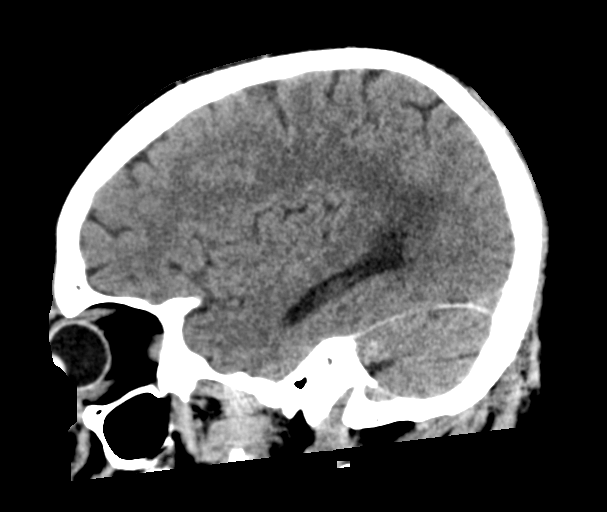
[im 29/58  brain]
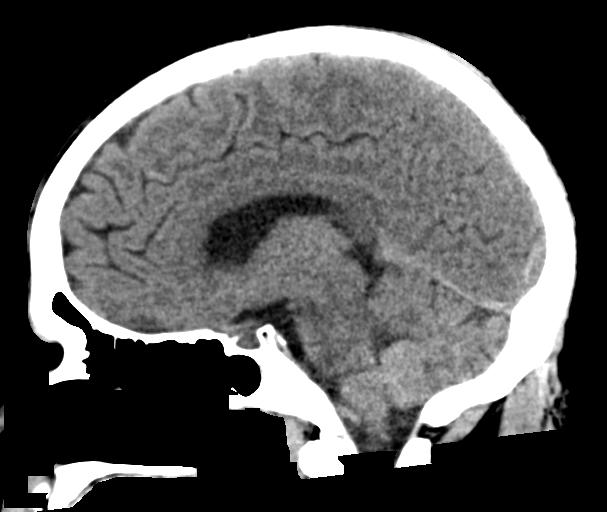
[im 39/58  brain]
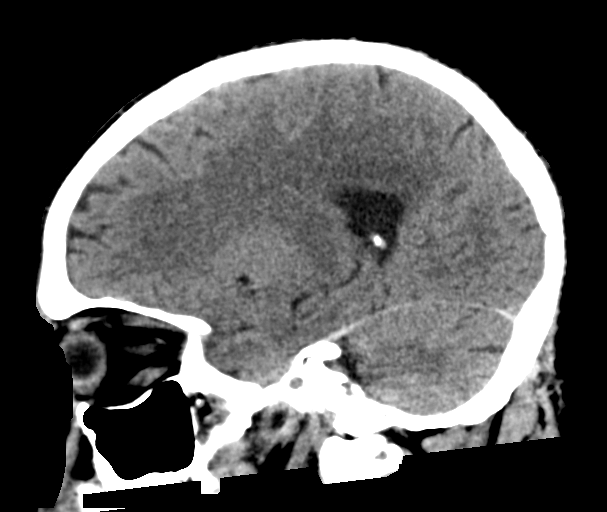

[17 of 47 positions shown; findings below may reference images not displayed]

FINDINGS: Brain: No evidence of acute large vascular territory infarction,
hemorrhage, hydrocephalus, extra-axial collection or mass
lesion/mass effect. Small remote lacunar infarcts versus dilated
perivascular spaces in bilateral basal ganglia. Additional patchy
white matter hypodensities, nonspecific but compatible with chronic
microvascular ischemic disease

Vascular: No hyperdense vessel identified.

Skull: Small high posterior scalp contusion.  No acute fracture.

Sinuses/Orbits: Clear sinuses.  Unremarkable orbits.

Other: No mastoid effusions.

ASPECTS ([place] Stroke Program Early CT Score) total score (0-10
with 10 being normal): 10.
IMPRESSION: 1. No evidence of acute large vascular territory infarct or acute
hemorrhage. ASPECTS is 10.
2. Small high posterior scalp contusion.

Code stroke imaging results were communicated on [DATE] at [DATE] to provider Dr. EMME via secure text paging.

## 2021-05-31 IMAGING — MR MR MRA HEAD W/O CM
2 series · 17 of 48 positions shown · non-contrast
Comparison: Same-day noncontrast CT head

CLINICAL DATA: Neuro deficit, stroke suspected



[Series 3: ax (id) · axial · 1.0mm · 0.43mm/px · z∈[-73,+14]mm · 16 of 193 slices shown]
[im 1/193]
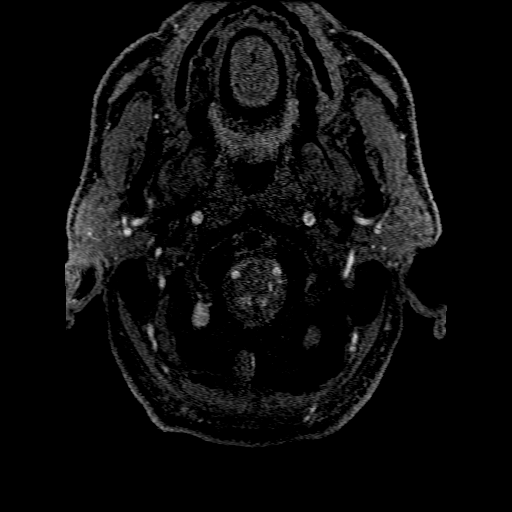
[im 5/193]
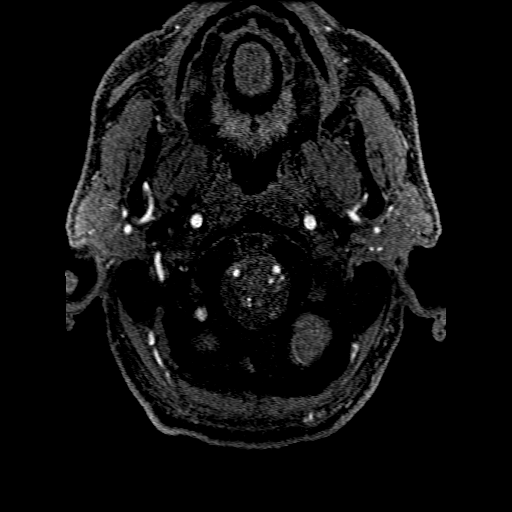
[im 9/193]
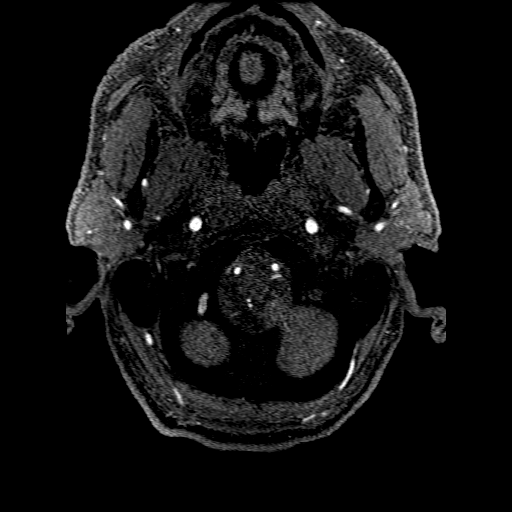
[im 13/193]
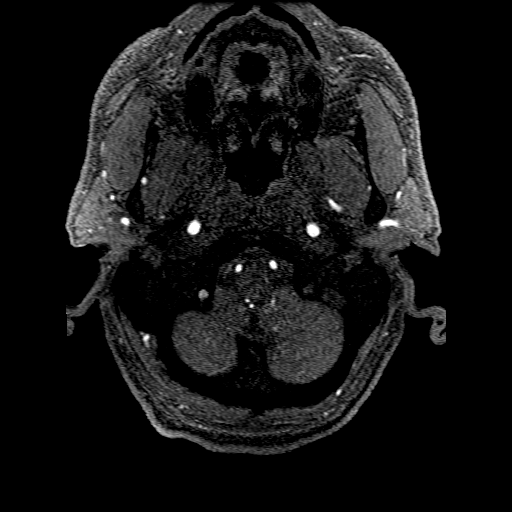
[im 17/193]
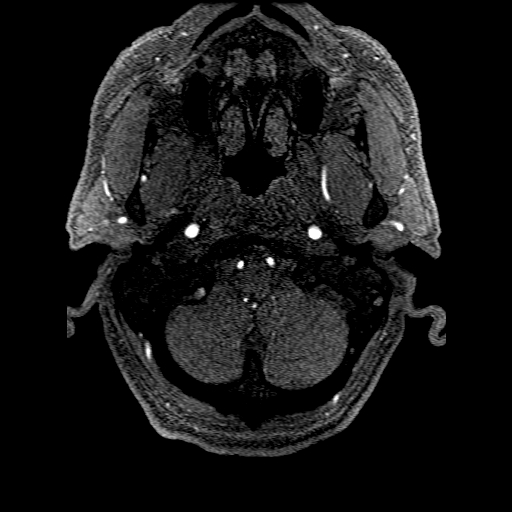
[im 21/193]
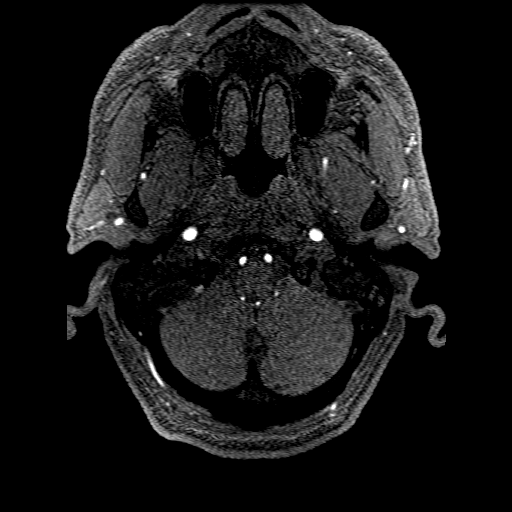
[im 30/193]
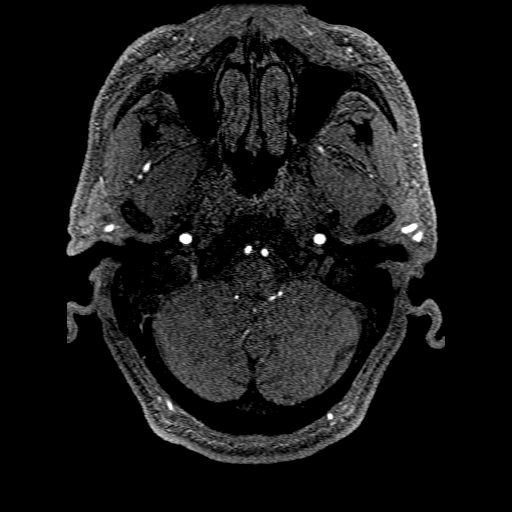
[im 34/193]
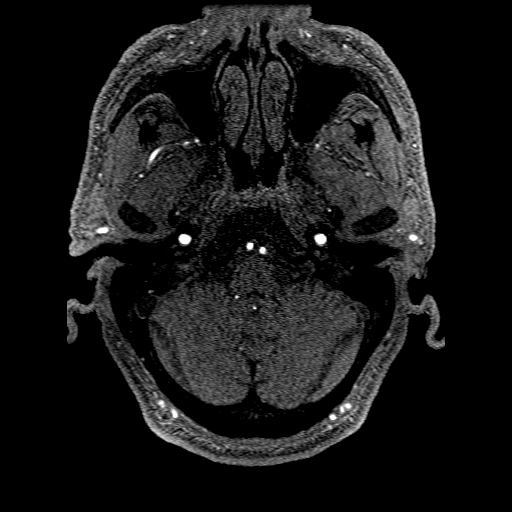
[im 59/193]
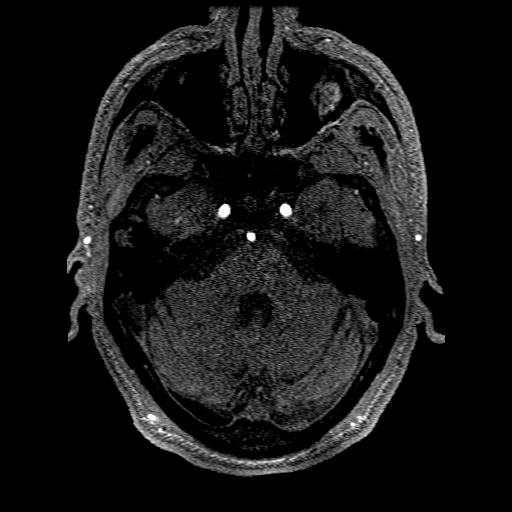
[im 84/193]
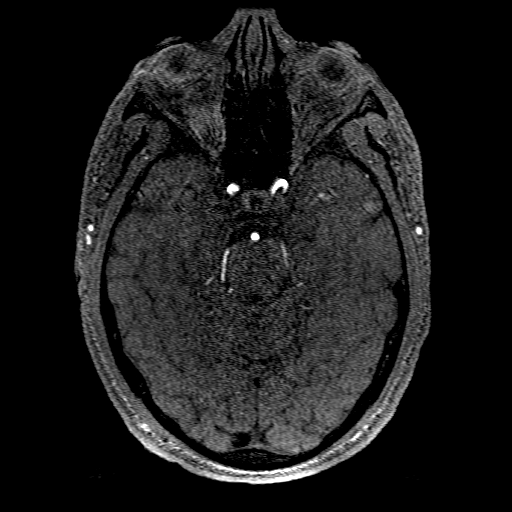
[im 97/193]
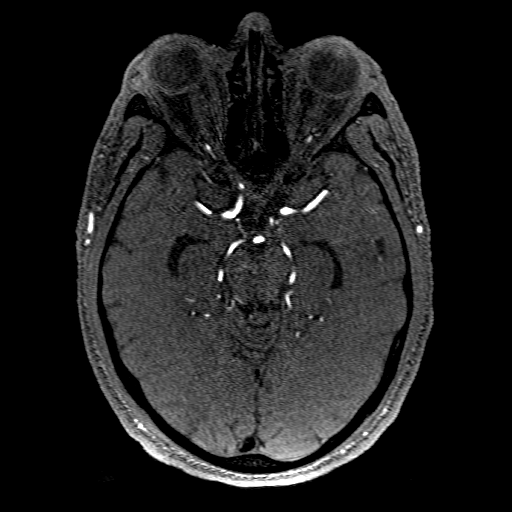
[im 109/193]
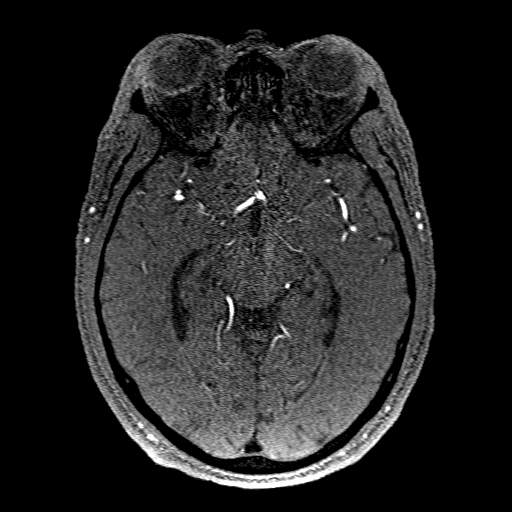
[im 134/193]
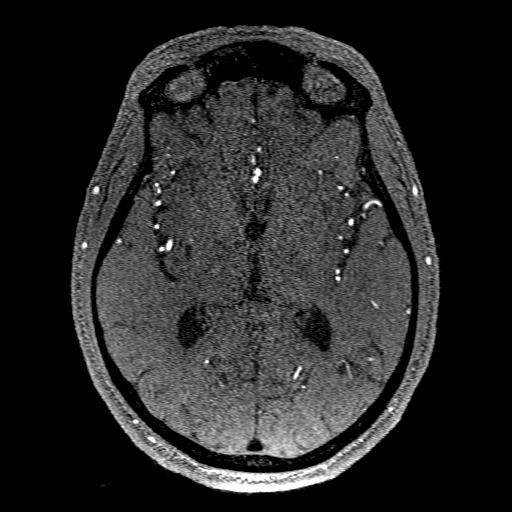
[im 159/193]
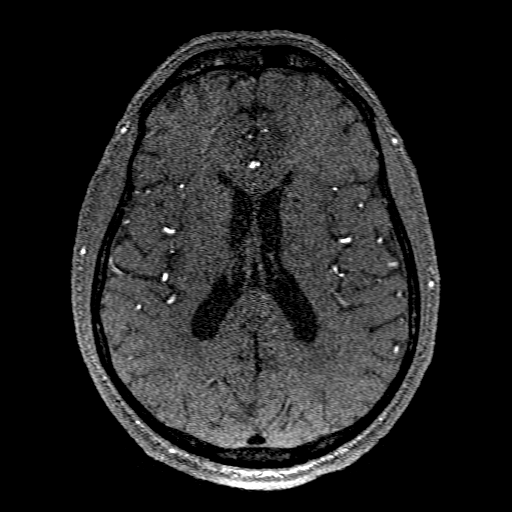
[im 163/193]
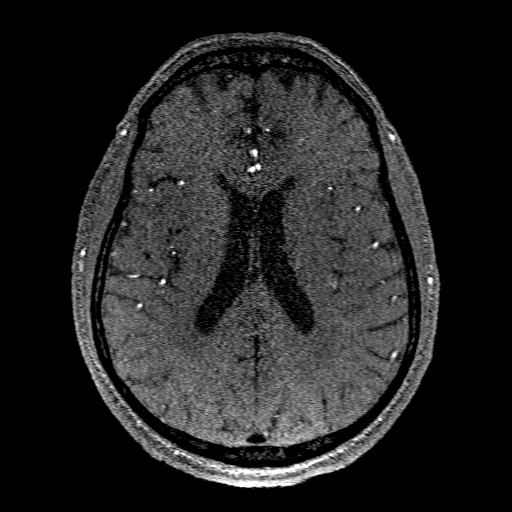
[im 184/193]
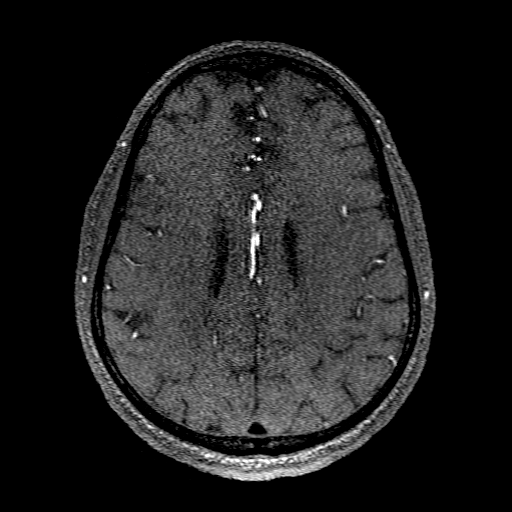

[Series 301: pjn:ax (id) · sagittal · 1.0mm · 0.43mm/px · 1 of 3 slices shown]
[im 1/3]
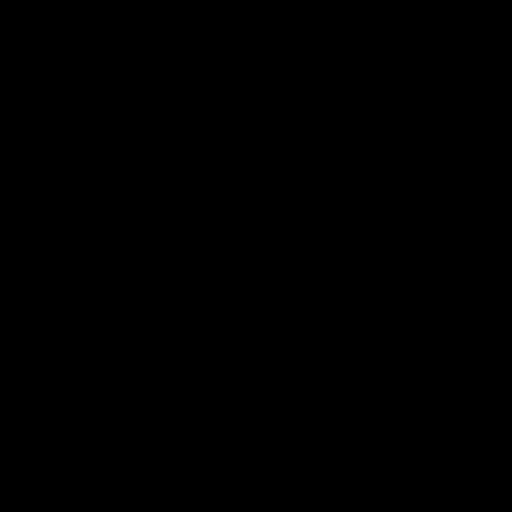

[17 of 48 positions shown; findings below may reference images not displayed]

FINDINGS: MRI HEAD FINDINGS

Brain: There is no acute intracranial hemorrhage, extra-axial fluid
collection, or acute infarct.

Parenchymal volume is normal. The ventricles are normal in size.
There is patchy and confluent FLAIR signal abnormality throughout
the subcortical and periventricular white matter likely reflecting
sequela of moderate chronic white matter microangiopathy, advanced
for age. There are small remote lacunar infarcts in the bilateral
basal ganglia and left caudate head. Additional small remote infarct
is seen in the right cerebral peduncle extending to the pons. There
are scattered punctate chronic microhemorrhages including in the
left caudate head, right lentiform nucleus, and right parietal and
occipital lobes.

There is no solid mass lesion.  There is no midline shift.

Vascular: Normal flow voids.

Skull and upper cervical spine: Normal marrow signal.

Sinuses/Orbits: There is mild mucosal thickening in the paranasal
sinuses. There is T2 hyperintensity in the left optic nerve (10-24,
10-27).

Other: None.

MRA HEAD FINDINGS

Anterior circulation: The intracranial ICAs are patent.

The bilateral MCAs are patent

There is mild focal stenosis of the left A2 segment (3-156). The
ACAs are otherwise patent. The anterior communicating artery is
patent.

There is no aneurysm.

Posterior circulation: The bilateral V4 segments are patent. The
basilar artery is patent.

The bilateral PCAs are patent. The left posterior communicating
artery is identified. The right posterior communicating artery is
not definitely seen.

There is no aneurysm.

Anatomic variants: None.

MRA NECK FINDINGS

Aortic arch: The imaged portions of the aortic arch are grossly
unremarkable, though suboptimally evaluated. The origins of the
major branch vessels are patent.

Right carotid system: Right common, internal, and external carotid
arteries are patent, without hemodynamically significant stenosis or
occlusion. There is no evidence of dissection or aneurysm.

Left carotid system: The left common, internal, and external carotid
arteries are patent, without hemodynamically significant stenosis or
occlusion. There is no evidence of dissection or aneurysm.

Vertebral arteries: Vertebral arteries are patent with antegrade
flow. There is no evidence of hemodynamically significant stenosis
or occlusion. There is no evidence of dissection or aneurysm.

Other: None
IMPRESSION: 1. No acute intracranial pathology.
2. Signal abnormality in the left optic nerve may reflect sequela of
prior optic neuritis in the absence of acute symptoms.
3. Scattered punctate chronic microhemorrhages, some of which are in
a peripheral distribution, nonspecific. Sequela of cerebral amyloid
angiopathy can not be entirely excluded.
4. Moderate chronic white matter microangiopathy, advanced for age.
5. Mild focal stenosis of the left A2 segment. Otherwise, patent
intracranial vasculature.
6. Patent vasculature of the neck.

## 2021-05-31 MED ORDER — CARVEDILOL 12.5 MG PO TABS
12.5000 mg | ORAL_TABLET | Freq: Every evening | ORAL | Status: DC
  Administered 2021-06-01: 17:00:00 12.5 mg via ORAL
  Filled 2021-05-31: qty 1

## 2021-05-31 MED ORDER — CALCIUM ACETATE (PHOS BINDER) 667 MG PO CAPS
1334.0000 mg | ORAL_CAPSULE | Freq: Three times a day (TID) | ORAL | Status: DC
  Administered 2021-06-01 – 2021-06-02 (×4): 1334 mg via ORAL
  Filled 2021-05-31 (×4): qty 2

## 2021-05-31 MED ORDER — PREDNISOLONE ACETATE 1 % OP SUSP
1.0000 [drp] | Freq: Four times a day (QID) | OPHTHALMIC | Status: DC
  Administered 2021-06-01 – 2021-06-02 (×6): 1 [drp] via OPHTHALMIC
  Filled 2021-05-31: qty 5

## 2021-05-31 MED ORDER — LATANOPROST 0.005 % OP SOLN
1.0000 [drp] | Freq: Every day | OPHTHALMIC | Status: DC
  Administered 2021-06-01: 22:00:00 1 [drp] via OPHTHALMIC
  Filled 2021-05-31: qty 2.5

## 2021-05-31 MED ORDER — PANTOPRAZOLE SODIUM 40 MG PO TBEC
40.0000 mg | DELAYED_RELEASE_TABLET | Freq: Every day | ORAL | Status: DC
  Administered 2021-06-01 – 2021-06-02 (×2): 40 mg via ORAL
  Filled 2021-05-31 (×2): qty 1

## 2021-05-31 MED ORDER — SOFOSBUVIR-VELPATASVIR 400-100 MG PO TABS
1.0000 | ORAL_TABLET | Freq: Every day | ORAL | Status: DC
Start: 2021-06-01 — End: 2021-06-01

## 2021-05-31 MED ORDER — HEPARIN SODIUM (PORCINE) 5000 UNIT/ML IJ SOLN
5000.0000 [IU] | Freq: Three times a day (TID) | INTRAMUSCULAR | Status: DC
  Administered 2021-05-31 – 2021-06-02 (×6): 5000 [IU] via SUBCUTANEOUS
  Filled 2021-05-31 (×6): qty 1

## 2021-05-31 MED ORDER — ISOSORBIDE MONONITRATE ER 60 MG PO TB24
60.0000 mg | ORAL_TABLET | Freq: Every evening | ORAL | Status: DC
  Administered 2021-06-01: 17:00:00 60 mg via ORAL
  Filled 2021-05-31: qty 1

## 2021-05-31 MED ORDER — AMLODIPINE BESYLATE 10 MG PO TABS
10.0000 mg | ORAL_TABLET | Freq: Every day | ORAL | Status: DC
  Administered 2021-06-01 – 2021-06-02 (×2): 10 mg via ORAL
  Filled 2021-05-31 (×2): qty 1

## 2021-05-31 MED ORDER — SODIUM CHLORIDE 0.9% FLUSH
3.0000 mL | Freq: Once | INTRAVENOUS | Status: AC
Start: 2021-05-31 — End: 2021-05-31
  Administered 2021-05-31: 16:00:00 3 mL via INTRAVENOUS

## 2021-05-31 MED ORDER — FERROUS SULFATE 325 (65 FE) MG PO TABS
325.0000 mg | ORAL_TABLET | Freq: Every day | ORAL | Status: DC
  Administered 2021-06-01 – 2021-06-02 (×2): 325 mg via ORAL
  Filled 2021-05-31 (×2): qty 1

## 2021-05-31 MED ORDER — HYDRALAZINE HCL 25 MG PO TABS
25.0000 mg | ORAL_TABLET | Freq: Three times a day (TID) | ORAL | Status: DC
  Administered 2021-06-01 – 2021-06-02 (×5): 25 mg via ORAL
  Filled 2021-05-31 (×5): qty 1

## 2021-05-31 MED ORDER — LABETALOL HCL 5 MG/ML IV SOLN
10.0000 mg | Freq: Once | INTRAVENOUS | Status: AC
  Administered 2021-05-31: 19:00:00 10 mg via INTRAVENOUS
  Filled 2021-05-31: qty 4

## 2021-05-31 NOTE — ED Triage Notes (Signed)
Pt BIB EMS from Dialysis, pt had 15 min. Left of dialysis when he became unresponsive, apneic for 30 seconds, then woke up combative, and world salad speech. Code stroke was activated on scene. Upon arrival pt was able to communicate, had left arm drip and slight speech delay.

## 2021-05-31 NOTE — Code Documentation (Signed)
Stroke Response Nurse Documentation Code Documentation  Travaughn Vue is a 65 y.o. male arriving to [facility] ED via [facility] on 05/31/2021 with past medical hx of CKD, Hep C, thrombocytopenia. Code stroke was activated by ED.  Patient from [facility] where he was LKW at 05/29/2021 when he went to bed. Pt reports that he woke up on 05/30/2021 with worsening blurred vision. and now complaining of trouble speaking and left leg weakness. Pt was at dialysis when he had a sudden onset of unresponsiveness and loss of consciousness. Reported 30-60 seconds of Apnea. Pt woke up and was combative, unable to speak and Code Stroke activated.   Stroke team at the bedside on patient arrival. Labs drawn and patient cleared for CT by EDP. Patient to CT with team. NIHSS 2, see documentation for details and code stroke times. Patient with left leg weakness and dysarthria  on exam. The following imaging was completed: CT.  Patient is not a candidate for IV Thrombolytic due to outside window. Patient is not a candidate for IR due to no LVO per MD.   Care/Plan: q2 NIHSS/VS and MRI.   Bedside handoff with ED RN [place] [facility].    Kathrin Greathouse  Stroke Response RN

## 2021-05-31 NOTE — ED Notes (Signed)
Admit provider at bedside 

## 2021-05-31 NOTE — Progress Notes (Signed)
EEG complete - results pending 

## 2021-05-31 NOTE — Hospital Course (Addendum)
Masin Shatto is a 65 y.o. male with PMHx of  HTN, ESRD on HD MWF, Hepatitis C, anemia, small bowel AVM, HFpEF, T2DM diet controlled, glaucoma., who presented with acute encephalopathy and code stroke was called.  Acute Encephalopathy   Stroke vs syncope vs TIA  Patient presented after having altered mental status during dialysis.  On arrival code stroke was called and CT scan and MRI were obtained these both showed no evidence of acute stroke or general acute pathology.  Neurology was consulted and performed an EEG which came back within normal limits.  Patient was dialyzed without recurrence of the altered mental status.  Patient was kept on telemetry throughout HD and no abnormal rhythms captured.  Cardiology was contacted for outpatient follow-up scheduling per nephrology's request.  ESRD on HD Monday Wednesday Friday Patient dialyzed while in the hospital.  Tolerated well.  Nephrology was consulted.  Etiology of altered mental status unclear to them as well.  They did recommend cardiology evaluation which could be done outpatient if patient tolerated HD well.   Other chronic conditions were medically managed with home medications and formulary alternatives as necessary   Issues for Follow Up:  [facility] follow-up for chronic hepatitis C management.  On Epclusa for 12 weeks starting 9/26, missed 3 dose. Outpatient cardiology follow-up to assess for possible arrhythmia. Evaluate blood pressure, increase Coreg at discharge   Significant Procedures:  12/22-EEG within normal limits 12/23-HD received inpatient

## 2021-05-31 NOTE — ED Notes (Signed)
At dialysis 30 min left felt a hot ball in ruq abd and then felt hot in the head and the next thing you new he was in the back of the ambulance. Denies any symptoms at this time

## 2021-05-31 NOTE — Consult Note (Addendum)
Neurology Consultation  Reason for Consult: Code Stroke Referring Physician: Dr. Almyra Free  CC: Dysarthria, left lower extremity weakness following an episode of unresponsiveness at HD  History is obtained from: EMS, chart review, limited history from patient as patient does not recall events surrounding unresponsive state  HPI: Jeffrey Zuniga is a 65 y.o. male with a medical history significant for ESRD on HD MWF, anemia, thrombocytopenia, essential hypertension, type 2 diabetes mellitus, diastolic heart failure, and chronic hepatitis C who presented to the ED via EMS on 12/21 for evaluation of speech disturbance with combativeness nearing the end of his hemodialysis session today at approximately 14:30. Patient has reportedly gotten HD for about one month now but has had multiple episodes of unresponsiveness followed by speech disturbance and combativeness since starting HD. The clinic states that these events typically occur near the end of his session when he receives a 100 - 200 cc bolus of fluid. Today, he had approximately 15 minutes left in his HD when he had an unresponsive state with apnea and staff attempted an oral airway. The patient became responsive at this time and was initially nonverbal and combative. Patient remained combative for approximately 30 minutes with garbled speech and "word salad" reported by EMS. Patient does not have a reported history of seizures and staff did not note any seizure-like activity today during or after his unresponsive state. Patient was found by EMS to be hypertensive with a blood pressure reading of 220/100. When patient was interviewed a bit further patient claims of blurry vision at baseline secondary to a cataract but that he had an acute worsening in his blurry vision when he woke up yesterday morning on 12/20.  LKW: 12/19 prior to bed TNK given?: no, patient is outside of the window for thrombolytic therapy IR Thrombectomy? No, presentation is not  consistent with an LVO.  Modified Rankin Scale: 0-Completely asymptomatic and back to baseline post- stroke  ROS: A complete ROS was performed and is negative except as noted in the HPI.   Past Medical History:  Diagnosis Date   Acute on chronic kidney failure ([place])    Anemia 04/04/2020   Blood transfusion without reported diagnosis    Diabetes mellitus without complication ([place])    GI bleed    H. pylori infection    Hemorrhoids    Hyperplastic colon polyp    Hypertension    Past Surgical History:  Procedure Laterality Date   AV FISTULA PLACEMENT Left 02/10/2021   Procedure: LEFT UPPER EXTREMITY ARTERIOVENOUS (AV) FISTULA  CREATION;  Surgeon: Waynetta Sandy, MD;  Location: [place];  Service: Vascular;  Laterality: Left;   BIOPSY  08/31/2020   Procedure: BIOPSY;  Surgeon: Irving Copas., MD;  Location: [place];  Service: Gastroenterology;;   BIOPSY  03/06/2021   Procedure: BIOPSY;  Surgeon: Jerene Bears, MD;  Location: [place];  Service: Gastroenterology;;   CARDIAC CATHETERIZATION     COLONOSCOPY WITH PROPOFOL N/A 08/31/2020   Procedure: COLONOSCOPY WITH PROPOFOL;  Surgeon: Irving Copas., MD;  Location: [place];  Service: Gastroenterology;  Laterality: N/A;   ENTEROSCOPY N/A 03/06/2021   Procedure: ENTEROSCOPY;  Surgeon: Jerene Bears, MD;  Location: [facility] ENDOSCOPY;  Service: Gastroenterology;  Laterality: N/A;   ESOPHAGOGASTRODUODENOSCOPY (EGD) WITH PROPOFOL N/A 08/31/2020   Procedure: ESOPHAGOGASTRODUODENOSCOPY (EGD) WITH PROPOFOL;  Surgeon: Rush Landmark Telford Nab., MD;  Location: [place];  Service: Gastroenterology;  Laterality: N/A;   GIVENS CAPSULE STUDY N/A 03/04/2021   Procedure: GIVENS CAPSULE STUDY;  Surgeon:  Gatha Mayer, MD;  Location: [facility] ENDOSCOPY;  Service: Endoscopy;  Laterality: N/A;   HEMOSTASIS CLIP PLACEMENT  03/06/2021   Procedure: HEMOSTASIS CLIP PLACEMENT;  Surgeon: Jerene Bears, MD;  Location: [place] ENDOSCOPY;  Service:  Gastroenterology;;   HOT HEMOSTASIS N/A 03/06/2021   Procedure: HOT HEMOSTASIS (ARGON PLASMA COAGULATION/BICAP);  Surgeon: Jerene Bears, MD;  Location: [facility] ENDOSCOPY;  Service: Gastroenterology;  Laterality: N/A;   IR FLUORO GUIDE CV LINE RIGHT  02/09/2021   IR US GUIDE VASC ACCESS RIGHT  02/09/2021   POLYPECTOMY  08/31/2020   Procedure: POLYPECTOMY;  Surgeon: Mansouraty, Telford Nab., MD;  Location: [facility] ENDOSCOPY;  Service: Gastroenterology;;   Family History  Problem Relation Age of Onset   Liver disease Neg Hx    Liver cancer Neg Hx    Social History:   reports that he has been smoking cigarettes. He has been smoking an average of .25 packs per day. He has never used smokeless tobacco. He reports that he does not currently use alcohol. He reports that he does not use drugs.  Medications  Current Facility-Administered Medications:    sodium chloride flush (NS) 0.9 % injection 3 mL, 3 mL, Intravenous, Once, [place], Greggory Brandy, MD  Current Outpatient Medications:    amLODipine (NORVASC) 10 MG tablet, Take 1 tablet (10 mg total) by mouth daily., Disp: 30 tablet, Rfl: 9   Darbepoetin Alfa (ARANESP) 200 MCG/0.4ML SOSY injection, Inject 0.4 mLs (200 mcg total) into the vein every Monday with hemodialysis., Disp: 1.68 mL, Rfl: 1   ferrous sulfate 325 (65 FE) MG tablet, Take 1 tablet (325 mg total) by mouth daily with breakfast., Disp: 30 tablet, Rfl: 6   isosorbide mononitrate (IMDUR) 60 MG 24 hr tablet, Take 1 tablet (60 mg total) by mouth daily., Disp: 30 tablet, Rfl: 4   labetalol (NORMODYNE) 100 MG tablet, Take 2 tablets (200 mg total) by mouth 2 (two) times daily., Disp: 60 tablet, Rfl: 5   latanoprost (XALATAN) 0.005 % ophthalmic solution, Place 1 drop into the left eye at bedtime., Disp: 2.5 mL, Rfl: 12   pantoprazole (PROTONIX) 40 MG tablet, Take 1 tablet (40 mg total) by mouth daily., Disp: 30 tablet, Rfl: 6   Sofosbuvir-Velpatasvir (EPCLUSA) 400-100 MG TABS, Take 1 tablet by mouth daily., Disp:  28 tablet, Rfl: 2  Exam: Current vital signs: BP (!) 190/67    Pulse 84    Resp 15    SpO2 98%  Vital signs in last 24 hours: Pulse Rate:  [84] 84 (12/21 1539) Resp:  [15] 15 (12/21 1541) BP: (190)/(67) 190/67 (12/21 1539) SpO2:  [98 %] 98 % (12/21 1539)  GENERAL: Awake, alert, in no acute distress Psych: Affect appropriate for situation, patient is calm and cooperative with examination Head: Normocephalic and atraumatic, without obvious abnormality EENT: Normal conjunctivae, dry mucous membranes, no OP obstruction LUNGS: Normal respiratory effort. Non-labored breathing on room air CV: Regular rate and rhythm on telemetry, hypertensive on cardiac monitor ABDOMEN: Soft, non-tender, non-distended Extremities: warm, well perfused, without obvious deformity  NEURO:  Mental Status: Awake, alert, and oriented to self, age, month, and year.  He states that he remembers feeling a burning sensation in his stomach and then waking up with nasal cannula prongs in his nose but is unable to remember other events.  Speech/Language: speech is dysarthric.  Naming, fluency, and comprehension intact without aphasia. No neglect is noted Cranial Nerves:  II: PERRL, but with left APD. Visual fields full in right eye, no  light perception in left eye.  III, IV, VI: EOMI without ptosis or gaze preference V: Sensation is intact to light touch and symmetrical to face.  VII: Face is symmetric resting and smiling.  VIII: Hearing is intact to voice IX, X: Palate elevation is symmetric. Phonation normal.  XI: Normal sternocleidomastoid and trapezius muscle strength XII: Tongue protrudes midline without fasciculations.   Motor: 5/5 strength present in right upper and lower extremities and left upper extremity.  Left lower extremity has minimal weakness with minimal vertical drift.  Tone is normal. Bulk is normal.  Sensation: Intact to light touch and pinprick sensation bilaterally in all four extremities.    Coordination: FTN intact bilaterally. HKS intact bilaterally.   Gait: Deferred  NIHSS: 1a Level of Conscious.: 0 1b LOC Questions: 0 1c LOC Commands: 0 2 Best Gaze: 0 3 Visual: 0 4 Facial Palsy: 0 5a Motor Arm - left: 0 5b Motor Arm - Right: 0 6a Motor Leg - Left: 1 6b Motor Leg - Right: 0 7 Limb Ataxia: 0 8 Sensory: 0 9 Best Language: 0 10 Dysarthria: 1 11 Extinct. and Inatten.: 0 TOTAL: 2  Labs I have reviewed labs in epic and the results pertinent to this consultation are: CBC    Component Value Date/Time   WBC 5.9 03/28/2021 1710   WBC 4.8 03/06/2021 0559   RBC 3.24 (L) 03/28/2021 1710   RBC 2.64 (L) 03/06/2021 0559   HGB 9.2 (L) 05/31/2021 1526   HGB 9.4 (L) 03/28/2021 1710   HCT 27.0 (L) 05/31/2021 1526   HCT 28.0 (L) 03/28/2021 1710   PLT 85 (LL) 03/28/2021 1710   MCV 86 03/28/2021 1710   MCH 29.0 03/28/2021 1710   MCH 29.2 03/06/2021 0559   MCHC 33.6 03/28/2021 1710   MCHC 32.2 03/06/2021 0559   RDW 17.0 (H) 03/28/2021 1710   LYMPHSABS 0.6 (L) 02/13/2021 1138   MONOABS 1.1 (H) 02/13/2021 1138   EOSABS 0.1 02/13/2021 1138   BASOSABS 0.0 02/13/2021 1138   CMP     Component Value Date/Time   NA 139 05/31/2021 1526   K 3.3 (L) 05/31/2021 1526   CL 101 05/31/2021 1526   CO2 23 03/04/2021 0416   GLUCOSE 145 (H) 05/31/2021 1526   BUN 12 05/31/2021 1526   CREATININE 3.80 (H) 05/31/2021 1526   CALCIUM 7.8 (L) 03/04/2021 0416   CALCIUM 7.3 (L) 02/12/2021 0045   PROT 6.8 03/02/2021 1800   ALBUMIN 2.9 (L) 03/04/2021 0416   AST 32 03/02/2021 1800   ALT 28 03/02/2021 1800   ALT 24 03/02/2021 1546   ALKPHOS 90 03/02/2021 1800   BILITOT 0.5 03/02/2021 1800   GFRNONAA 11 (L) 03/04/2021 0416   Lipid Panel  No results found for: CHOL, TRIG, HDL, CHOLHDL, VLDL, LDLCALC, LDLDIRECT  Lab Results  Component Value Date   HGBA1C 4.8 03/03/2021   Imaging I have reviewed the images obtained:  CT-scan of the brain 12/21: 1. No evidence of acute large  vascular territory infarct or acute hemorrhage. ASPECTS is 10. 2. Small high posterior scalp contusion.  Assessment: [age] y.o. male with history as above who presented to the ED for evaluation of combativeness and speech disturbance after an unresponsive state during hemodialysis today. Patient does have history of the same with 30 minutes of confusion, combativeness, and speech disturbance following unresponsiveness.  - Examination reveals patient with minimal left lower extremity weakness and dysarthria with an NIHSS of 2.  - Presentation is concerning for dialysis disequilibrium syndrome  versus PRES versus hypertensive encephalopathy versus seizure. It is unlikely that the event was directly related to syncope with prolonged AMS following unresponsiveness and less likely stroke/TIA with unresponsive state followed by prolonged AMS. Will obtain MRI brain for further evaluation.  - CTH obtained without evidence of an acute large vascular territory infarction or acute hemorrhage with an ASPECTS of 10. - Stroke risk factors include history of HTN, ESRD, and DM2.  Recommendations: - MRI wo contrast when able to obtain - Frequent neuro checks and notify neurology STAT for any neurologic decline - Recommend admission for observation  - Routine EEG  - Management of hypertension per primary team / EDP  Pt seen by NP/Neuro and later by MD. Note/plan to be edited by MD as needed.  Anibal Henderson, AGAC-NP [facility] Pager: [phone]  I have seen the patient and reviewed the above note.   The patient was found to be severely hypertensive when he became unresponsive.  At this point, the differential is wide with hypertensive encephalopathy, pres, seizure, dialysis disequilibrium while in contention.  I do think an EEG would be prudent given that he has had three of these episodes since starting dialysis.  I am not certain that I would start antiepileptics unless there was clear evidence  of seizure on EEG.  With LOC, I think that TIA/CVA is less likely.  If MRI is negative for stroke, I would favor treating blood pressure as hypertensive emergency.  Neurological continue to follow.  Roland Rack, MD [facility] [phone]  If 7pm- 7am, please page neurology on call as listed in [place].

## 2021-05-31 NOTE — Progress Notes (Addendum)
FPTS Brief Progress Note  S: Quintus was seen this PM awake. On 9/22 was his ID appointment, he receives meds from mailed pharmacy, started them on 9/26 and has been adherent but missed 3 dose (11/24-11/27) due to shipping delay, but otherwise has been adherent, last dose was 12/21 (day of admission).   O: BP (!) 194/80    Pulse 73    Resp 15    Ht 5\' 7"  (1.702 m)    Wt 73.2 kg    SpO2 99%    BMI 25.28 kg/m   Physical Exam Vitals and nursing note reviewed.  Constitutional:      General: He is awake. He is not in acute distress.    Appearance: He is not ill-appearing, toxic-appearing or diaphoretic.  HENT:     Head: Normocephalic and atraumatic.  Cardiovascular:     Rate and Rhythm: Normal rate and regular rhythm.     Heart sounds: Normal heart sounds. No murmur heard.   No friction rub. No gallop.  Pulmonary:     Effort: Pulmonary effort is normal. No respiratory distress.     Breath sounds: No decreased air movement. Examination of the right-lower field reveals wheezing and rales. Wheezing (Expiratory) and rales present.  Musculoskeletal:     Right lower leg: No edema.     Left lower leg: No edema.  Skin:    General: Skin is warm and dry.  Neurological:     General: No focal deficit present.     Mental Status: He is alert and oriented to person, place, and time.     Cranial Nerves: No cranial nerve deficit, dysarthria or facial asymmetry.     Motor: No tremor.  Psychiatric:        Behavior: Behavior is cooperative.     A/P: Possible hypertensive encephalopathy v PRES A&Ox 4. BP stable at 190s/80s.  Received hydralazine 25 mg x 1 in the ED.  Restarted home amlodipine 10 mg daily for tomorrow EEG completed, now pending impression Orders reviewed. Labs for AM ordered, which was adjusted as needed.   Remainder of plan per day team/daily progress note.  Merrily Brittle, DO 05/31/2021, 10:01 PM PGY-1, [facility] Night Resident  Please page [phone] with  questions.

## 2021-05-31 NOTE — H&P (Signed)
[facility] Admission History and Physical Service Pager: [phone]  Patient name: Jeffrey Zuniga Medical record number: [id number] Date of birth: 1955-12-11 Age: 65 y.o. Gender: male  Primary Care Provider: Eppie Gibson, MD Consultants: Neurology, Nephrology  Code Status: Full  Preferred Emergency Contact:   Name Relation Home Work Mobile   Kozak,LUYYIEFF Daughter   [phone]    Chief Complaint: altered mental status  Assessment and Plan: Jeffrey Zuniga is a 65 y.o. male presenting with altered mental status episode at the end of HD. PMH is significant for HTN, ESRD on HD MWF, Hepatitis C, anemia, small bowel AVM, HFpEF, T2DM diet controlled, Glaucoma.  Altered mental status   Stroke vs syncope vs TIA Patient brought to the EMS during HD session due to episode of unresponsiveness followed by speech disturbances (words out) and combative nature.  Patient had 2 previous episodes, but this episode also had associated apneic spell.  Patient has no memory of the event but did note a prodromal syndrome of burning abdominal pain, diaphoresis, dizziness palpitations, leg muscle cramping.  In the field, code stroke was called, symptoms had resolved upon arrival, patient received CT scan which was negative for acute findings. MRI with no acute pathology, did note signal abnormality in the left optic nerve, scattered punctate chronic microhemorrhages, moderate chronic white matter microangiopathy. Differential is broad including hypertensive encephalopathy, seizure, dialysis associated. Neurology and nephrology are following to help determine etiology. - Admit to [place], attending Dr. McDiarmid - Neurology consulted, appreciate recommendations - EEG per neurology - Frequent neuro checks - Echocardiogram - Vitals per routine - Bedside swallow, will start diet afterwards - Continuous cardiac monitoring - Lipid panel, TSH, A1c - PT/OT eval and treat  Significantly  elevated BP in the setting of chronic HTN BP elevated in the ER to 202/84, patient initially had permissive hypertension but that was discontinued with negative CT and MRI. In the ER, patient received IV labetalol 10mg . Home medications per chart review: labetalol 200mg  BID, amlodipine 10mg  daily, coreg 12.5mg , Imdur 60mg  daily - Closely monitor BP - Consider restarting home carvedilol and Imdur pending med rec - If pressures stable, will slowly add back other home medications  ESRD on HD MWF Patient with history of ESRD on HD MWF, he does not miss any of his sessions. He is being admitted for an episode of altered mental status at the end of HD. This is his 3rd episode and unsure of the etiology. Patient is currently being worked up in regards to the etiology, whether this neurologic in etiology or if there may be some component related to her dialysis. - Nephrology consulted, appreciate care and recommendations - Monitor BMP  Hepatitis C, stable Currently on Epclusa and is following with infectious disease.  Attempted to look at when patient's start date for the treatment is a supposed to continue for 12 weeks, does appear that he may have been a discontinuation at some point but unable to tell how far along he is in the treatment dose and patient states he still taking it.  We will reconcile this with pharmacy. - Continue home Epclusa once med-rec  History of anemia   small bowel AVM   Hx of H.pylori gastritis Hgb stable at 8.4, baseline appears to be around 8-9. September with symptomatic anemia. Was found to have AVMs of the small bowel and had argo plasma ablation.  - Continue home iron supplement once med-rec done  HFpEF   G2DD Baseline weight appears to be  around 157-160lbs, currently 161lbs 6oz. Last echocardiogram 02/08/2021. Home medications include coreg 12.5mg  BID - Continue home coreg 12.5mg  BID - Daily weights - Strict Is&Os  T2DM, diet controlled, stable Last A1c 4.8 on  03/03/2021. As patient is diet-controlled, will not trend with frequent CBGs at this time and will monitor with daily labs unless clinically indicated or A1c significantly elevated from prior. - Repeat A1c  - Monitor CBGs via daily BMP   Glaucoma   Cataracts, chronic and stable Patient following with ophthalmology. Has limitations in his eye-sight. Of note, left optic nerve may reflect sequela of prior optic neuritis. Patient does have some increased blurred vision, but unsure if this is related. - Continue to monitor for symptoms   FEN/GI: Renal diet w/fluid restriction Prophylaxis: Heparin  Disposition: Med-telemetry observation  History of Present Illness:  Jeffrey Zuniga is a 65 y.o. male presenting with altered mental status episode while at HD.  Today while patient was at dialysis, he had an episode in the last 15 minutes where he started feeling a burning sensation across his abdomen with associated diaphoresis, dizziness, palpitations, leg cramping.  The next thing the patient knew he was waking up in the ambulance, which was very terrifying for him.  He notes that he has had 2 other episodes previously with similar feelings before they happen but that this 1 was the worst one and he thought that he was going to die.  He states that he is not ready to die and that he is trying to do everything the right way and the best way that he can.  Per report from neurology note, the HD center noted that the patient had an unresponsive state with apnea and there was an attempt at an oral airway but the patient was able to wake up, he was initially nonverbal and combative.  He later started to have garbled speech with "word salad" per EMS.  On arrival to the ER, all symptoms had resolved.  Code stroke had been called in the field and patient was further evaluated in the ER.   Patient does note that these recent episodes have scared him more and his declining vision has also made him rethink some of his  living situation.  He is currently living alone, though he does have significant support with his daughter.  He has thought about not living alone and trying to figure out what would be the best options for him to make sure that he does not have any recent issues/episodes while he is alone.   Review Of Systems: Per HPI with the following additions:   Review of Systems  Constitutional:  Positive for diaphoresis. Negative for activity change, appetite change, chills, fatigue and fever.  HENT:  Negative for congestion, sinus pressure and sore throat.   Eyes:  Positive for visual disturbance (chronic visual changes due to cataracts).  Respiratory:  Negative for cough, choking, chest tightness and shortness of breath.   Cardiovascular:  Negative for chest pain and leg swelling.  Gastrointestinal:  Negative for abdominal pain, constipation, diarrhea, nausea and vomiting.  Genitourinary:  Negative for decreased urine volume and difficulty urinating.  Neurological:  Positive for dizziness and weakness.    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 associated with type 2 diabetes mellitus ([place]) 04/06/2021  Severe nonproliferative diabetic retinopathy of left eye, with macular edema, associated with type 2 diabetes mellitus ([place]) 04/06/2021   Nuclear sclerotic cataract of right eye 04/06/2021   Posterior subcapsular age-related cataract, right eye 04/06/2021   Vitreomacular traction syndrome, right 04/06/2021   Gastritis and duodenitis 03/28/2021   Chronic hepatitis C without hepatic coma ([place]) 03/03/2021   Anemia associated with chronic renal failure    Thrombocytopenia (H[facility])    Vision loss of right eye    Acute on chronic heart failure ([place]) 10/28/2020   LVH (left ventricular  hypertrophy) 04/04/2020   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 pylori infection    Hemorrhoids    Hyperplastic colon polyp    Hypertension     Past Surgical History: Past Surgical History:  Procedure Laterality Date   AV FISTULA PLACEMENT Left 02/10/2021   Procedure: LEFT UPPER EXTREMITY ARTERIOVENOUS (AV) FISTULA  CREATION;  Surgeon: Waynetta Sandy, MD;  Location: [place];  Service: Vascular;  Laterality: Left;   BIOPSY  08/31/2020   Procedure: BIOPSY;  Surgeon: Irving Copas., MD;  Location: [place];  Service: Gastroenterology;;   BIOPSY  03/06/2021   Procedure: BIOPSY;  Surgeon: Jerene Bears, MD;  Location: [place];  Service: Gastroenterology;;   CARDIAC CATHETERIZATION     COLONOSCOPY WITH PROPOFOL N/A 08/31/2020   Procedure: COLONOSCOPY WITH PROPOFOL;  Surgeon: Irving Copas., MD;  Location: [facility] ENDOSCOPY;  Service: Gastroenterology;  Laterality: N/A;   ENTEROSCOPY N/A 03/06/2021   Procedure: ENTEROSCOPY;  Surgeon: Jerene Bears, MD;  Location: [facility] ENDOSCOPY;  Service: Gastroenterology;  Laterality: N/A;   ESOPHAGOGASTRODUODENOSCOPY (EGD) WITH PROPOFOL N/A 08/31/2020   Procedure: ESOPHAGOGASTRODUODENOSCOPY (EGD) WITH PROPOFOL;  Surgeon: Rush Landmark Telford Nab., MD;  Location: [place];  Service: Gastroenterology;  Laterality: N/A;   GIVENS CAPSULE STUDY N/A 03/04/2021   Procedure: GIVENS CAPSULE STUDY;  Surgeon: Gatha Mayer, MD;  Location: [place];  Service: Endoscopy;  Laterality: N/A;   HEMOSTASIS CLIP PLACEMENT  03/06/2021   Procedure: HEMOSTASIS CLIP PLACEMENT;  Surgeon: Jerene Bears, MD;  Location: [facility] ENDOSCOPY;  Service: Gastroenterology;;   HOT  HEMOSTASIS N/A 03/06/2021   Procedure: HOT HEMOSTASIS (ARGON PLASMA COAGULATION/BICAP);  Surgeon: Jerene Bears, MD;  Location: [facility] ENDOSCOPY;  Service: Gastroenterology;  Laterality: N/A;   IR FLUORO GUIDE CV LINE RIGHT  02/09/2021   IR US GUIDE VASC ACCESS RIGHT  02/09/2021   POLYPECTOMY  08/31/2020   Procedure: POLYPECTOMY;  Surgeon: Mansouraty, Telford Nab., MD;  Location: [place];  Service: Gastroenterology;;    Social History: Social History   Tobacco Use   Smoking status: Every Day    Packs/day: 0.25    Types: Cigarettes   Smokeless tobacco: Never   Tobacco comments:    2021  " i AM CUTTING BACK :"  Vaping Use   Vaping Use: Never used  Substance Use Topics   Alcohol use: Not Currently   Drug use: Never   Additional social history: Smokes maybe 1 cigarette daily. No alcohol use. Rare marijuana use  Please also refer to relevant sections of EMR.  Family History: Family History  Problem Relation Age of Onset   Liver disease Neg Hx  Liver cancer Neg Hx     Allergies and Medications: No Known Allergies No current facility-administered medications on file prior to encounter.   Current Outpatient Medications on File Prior to Encounter  Medication Sig Dispense Refill   amLODipine (NORVASC) 10 MG tablet Take 1 tablet (10 mg total) by mouth daily. 30 tablet 9   Darbepoetin Alfa (ARANESP) 200 MCG/0.4ML SOSY injection Inject 0.4 mLs (200 mcg total) into the vein every Monday with hemodialysis. 1.68 mL 1   ferrous sulfate 325 (65 FE) MG tablet Take 1 tablet (325 mg total) by mouth daily with breakfast. 30 tablet 6   isosorbide mononitrate (IMDUR) 60 MG 24 hr tablet Take 1 tablet (60 mg total) by mouth daily. 30 tablet 4   labetalol (NORMODYNE) 100 MG tablet Take 2 tablets (200 mg total) by mouth 2 (two) times daily. 60 tablet 5   latanoprost (XALATAN) 0.005 % ophthalmic solution Place 1 drop into the left eye at bedtime. 2.5 mL 12   pantoprazole (PROTONIX) 40 MG tablet Take 1  tablet (40 mg total) by mouth daily. 30 tablet 6   Sofosbuvir-Velpatasvir (EPCLUSA) 400-100 MG TABS Take 1 tablet by mouth daily. 28 tablet 2    Objective: BP (!) 190/80    Pulse 81    Resp 17    Ht 5\' 7"  (1.702 m)    Wt 73.2 kg    SpO2 96%    BMI 25.28 kg/m  Exam: General -- oriented x3, pleasant and cooperative. HEENT -- Head is normocephalic. PERRLA. EOMI. Ears, nose and throat were benign.  Mild scleral icterus Neck -- supple; no bruits. Chest -- good expansion. Lungs clear to auscultation. Cardiac -- RRR. No murmurs noted.  Abdomen -- soft, nontender. No masses palpable. Bowel sounds present. Genital, rectal and breast exam -- deferred. CNS -- cranial nerves II through XII grossly intact. 2+ reflexes bilaterally. Extremities - no tenderness or effusions noted. ROM good. 5/5 bilateral strength. Dorsalis pedis pulses present and symmetrical.  Chronic skin changes noted in the right leg more prominently with large areas of hyperpigmentation. Neuro: CN II: PERRL CN III, IV,VI: EOMI CV V: Normal sensation in V1, V2, V3 CVII: Symmetric smile and brow raise CN VIII: Normal hearing CN IX,X: Symmetric palate raise  CN XI: 5/5 shoulder shrug CN XII: Symmetric tongue protrusion  UE and LE strength 5/5 Normal sensation in UE and LE bilaterally    Labs and Imaging: CBC BMET  Recent Labs  Lab 05/31/21 1520 05/31/21 1526  WBC 6.4  --   HGB 8.4* 9.2*  HCT 26.4* 27.0*  PLT 62*  --    Recent Labs  Lab 05/31/21 1520 05/31/21 1526  NA 136 139  K 3.3* 3.3*  CL 100 101  CO2 25  --   BUN 12 12  CREATININE 3.58* 3.80*  GLUCOSE 146* 145*  CALCIUM 8.0*  --      EKG: sinus rhythm, possible LVH noted  MR ANGIO HEAD WO CONTRAST  Result Date: 05/31/2021 CLINICAL DATA:  Neuro deficit, stroke suspected EXAM: MRI HEAD WITHOUT CONTRAST MRA HEAD WITHOUT CONTRAST MRA NECK WITHOUT CONTRAST TECHNIQUE: Multiplanar, multiecho pulse sequences of the brain and surrounding structures were  obtained without intravenous contrast. Angiographic images of the Circle of Willis were obtained using MRA technique without intravenous contrast. Angiographic images of the neck were obtained using MRA technique without intravenous contrast. Carotid stenosis measurements (when applicable) are obtained utilizing NASCET criteria, using the distal internal carotid diameter as the denominator. COMPARISON:  Same-day noncontrast CT head FINDINGS: MRI HEAD FINDINGS Brain: There is no acute intracranial hemorrhage, extra-axial fluid collection, or acute infarct. Parenchymal volume is normal. The ventricles are normal in size. There is patchy and confluent FLAIR signal abnormality throughout the subcortical and periventricular white matter likely reflecting sequela of moderate chronic white matter microangiopathy, advanced for age. There are small remote lacunar infarcts in the bilateral basal ganglia and left caudate head. Additional small remote infarct is seen in the right cerebral peduncle extending to the pons. There are scattered punctate chronic microhemorrhages including in the left caudate head, right lentiform nucleus, and right parietal and occipital lobes. There is no solid mass lesion.  There is no midline shift. Vascular: Normal flow voids. Skull and upper cervical spine: Normal marrow signal. Sinuses/Orbits: There is mild mucosal thickening in the paranasal sinuses. There is T2 hyperintensity in the left optic nerve (10-24, 10-27). Other: None. MRA HEAD FINDINGS Anterior circulation: The intracranial ICAs are patent. The bilateral MCAs are patent There is mild focal stenosis of the left A2 segment (3-156). The ACAs are otherwise patent. The anterior communicating artery is patent. There is no aneurysm. Posterior circulation: The bilateral V4 segments are patent. The basilar artery is patent. The bilateral PCAs are patent. The left posterior communicating artery is identified. The right posterior communicating  artery is not definitely seen. There is no aneurysm. Anatomic variants: None. MRA NECK FINDINGS Aortic arch: The imaged portions of the aortic arch are grossly unremarkable, though suboptimally evaluated. The origins of the major branch vessels are patent. Right carotid system: Right common, internal, and external carotid arteries are patent, without hemodynamically significant stenosis or occlusion. There is no evidence of dissection or aneurysm. Left carotid system: The left common, internal, and external carotid arteries are patent, without hemodynamically significant stenosis or occlusion. There is no evidence of dissection or aneurysm. Vertebral arteries: Vertebral arteries are patent with antegrade flow. There is no evidence of hemodynamically significant stenosis or occlusion. There is no evidence of dissection or aneurysm. Other: None IMPRESSION: 1. No acute intracranial pathology. 2. Signal abnormality in the left optic nerve may reflect sequela of prior optic neuritis in the absence of acute symptoms. 3. Scattered punctate chronic microhemorrhages, some of which are in a peripheral distribution, nonspecific. Sequela of cerebral amyloid angiopathy can not be entirely excluded. 4. Moderate chronic white matter microangiopathy, advanced for age. 5. Mild focal stenosis of the left A2 segment. Otherwise, patent intracranial vasculature. 6. Patent vasculature of the neck. Electronically Signed   By: Valetta Mole M.D.   On: 05/31/2021 17:34   MR ANGIO NECK WO CONTRAST  Result Date: 05/31/2021 CLINICAL DATA:  Neuro deficit, stroke suspected EXAM: MRI HEAD WITHOUT CONTRAST MRA HEAD WITHOUT CONTRAST MRA NECK WITHOUT CONTRAST TECHNIQUE: Multiplanar, multiecho pulse sequences of the brain and surrounding structures were obtained without intravenous contrast. Angiographic images of the Circle of Willis were obtained using MRA technique without intravenous contrast. Angiographic images of the neck were obtained  using MRA technique without intravenous contrast. Carotid stenosis measurements (when applicable) are obtained utilizing NASCET criteria, using the distal internal carotid diameter as the denominator. COMPARISON:  Same-day noncontrast CT head FINDINGS: MRI HEAD FINDINGS Brain: There is no acute intracranial hemorrhage, extra-axial fluid collection, or acute infarct. Parenchymal volume is normal. The ventricles are normal in size. There is patchy and confluent FLAIR signal abnormality throughout the subcortical and periventricular white matter likely reflecting sequela of moderate chronic white matter microangiopathy, advanced for age. There are small remote  lacunar infarcts in the bilateral basal ganglia and left caudate head. Additional small remote infarct is seen in the right cerebral peduncle extending to the pons. There are scattered punctate chronic microhemorrhages including in the left caudate head, right lentiform nucleus, and right parietal and occipital lobes. There is no solid mass lesion.  There is no midline shift. Vascular: Normal flow voids. Skull and upper cervical spine: Normal marrow signal. Sinuses/Orbits: There is mild mucosal thickening in the paranasal sinuses. There is T2 hyperintensity in the left optic nerve (10-24, 10-27). Other: None. MRA HEAD FINDINGS Anterior circulation: The intracranial ICAs are patent. The bilateral MCAs are patent There is mild focal stenosis of the left A2 segment (3-156). The ACAs are otherwise patent. The anterior communicating artery is patent. There is no aneurysm. Posterior circulation: The bilateral V4 segments are patent. The basilar artery is patent. The bilateral PCAs are patent. The left posterior communicating artery is identified. The right posterior communicating artery is not definitely seen. There is no aneurysm. Anatomic variants: None. MRA NECK FINDINGS Aortic arch: The imaged portions of the aortic arch are grossly unremarkable, though  suboptimally evaluated. The origins of the major branch vessels are patent. Right carotid system: Right common, internal, and external carotid arteries are patent, without hemodynamically significant stenosis or occlusion. There is no evidence of dissection or aneurysm. Left carotid system: The left common, internal, and external carotid arteries are patent, without hemodynamically significant stenosis or occlusion. There is no evidence of dissection or aneurysm. Vertebral arteries: Vertebral arteries are patent with antegrade flow. There is no evidence of hemodynamically significant stenosis or occlusion. There is no evidence of dissection or aneurysm. Other: None IMPRESSION: 1. No acute intracranial pathology. 2. Signal abnormality in the left optic nerve may reflect sequela of prior optic neuritis in the absence of acute symptoms. 3. Scattered punctate chronic microhemorrhages, some of which are in a peripheral distribution, nonspecific. Sequela of cerebral amyloid angiopathy can not be entirely excluded. 4. Moderate chronic white matter microangiopathy, advanced for age. 5. Mild focal stenosis of the left A2 segment. Otherwise, patent intracranial vasculature. 6. Patent vasculature of the neck. Electronically Signed   By: Valetta Mole M.D.   On: 05/31/2021 17:34   MR BRAIN WO CONTRAST  Result Date: 05/31/2021 CLINICAL DATA:  Neuro deficit, stroke suspected EXAM: MRI HEAD WITHOUT CONTRAST MRA HEAD WITHOUT CONTRAST MRA NECK WITHOUT CONTRAST TECHNIQUE: Multiplanar, multiecho pulse sequences of the brain and surrounding structures were obtained without intravenous contrast. Angiographic images of the Circle of Willis were obtained using MRA technique without intravenous contrast. Angiographic images of the neck were obtained using MRA technique without intravenous contrast. Carotid stenosis measurements (when applicable) are obtained utilizing NASCET criteria, using the distal internal carotid diameter as the  denominator. COMPARISON:  Same-day noncontrast CT head FINDINGS: MRI HEAD FINDINGS Brain: There is no acute intracranial hemorrhage, extra-axial fluid collection, or acute infarct. Parenchymal volume is normal. The ventricles are normal in size. There is patchy and confluent FLAIR signal abnormality throughout the subcortical and periventricular white matter likely reflecting sequela of moderate chronic white matter microangiopathy, advanced for age. There are small remote lacunar infarcts in the bilateral basal ganglia and left caudate head. Additional small remote infarct is seen in the right cerebral peduncle extending to the pons. There are scattered punctate chronic microhemorrhages including in the left caudate head, right lentiform nucleus, and right parietal and occipital lobes. There is no solid mass lesion.  There is no midline shift. Vascular: Normal flow voids.  Skull and upper cervical spine: Normal marrow signal. Sinuses/Orbits: There is mild mucosal thickening in the paranasal sinuses. There is T2 hyperintensity in the left optic nerve (10-24, 10-27). Other: None. MRA HEAD FINDINGS Anterior circulation: The intracranial ICAs are patent. The bilateral MCAs are patent There is mild focal stenosis of the left A2 segment (3-156). The ACAs are otherwise patent. The anterior communicating artery is patent. There is no aneurysm. Posterior circulation: The bilateral V4 segments are patent. The basilar artery is patent. The bilateral PCAs are patent. The left posterior communicating artery is identified. The right posterior communicating artery is not definitely seen. There is no aneurysm. Anatomic variants: None. MRA NECK FINDINGS Aortic arch: The imaged portions of the aortic arch are grossly unremarkable, though suboptimally evaluated. The origins of the major branch vessels are patent. Right carotid system: Right common, internal, and external carotid arteries are patent, without hemodynamically significant  stenosis or occlusion. There is no evidence of dissection or aneurysm. Left carotid system: The left common, internal, and external carotid arteries are patent, without hemodynamically significant stenosis or occlusion. There is no evidence of dissection or aneurysm. Vertebral arteries: Vertebral arteries are patent with antegrade flow. There is no evidence of hemodynamically significant stenosis or occlusion. There is no evidence of dissection or aneurysm. Other: None IMPRESSION: 1. No acute intracranial pathology. 2. Signal abnormality in the left optic nerve may reflect sequela of prior optic neuritis in the absence of acute symptoms. 3. Scattered punctate chronic microhemorrhages, some of which are in a peripheral distribution, nonspecific. Sequela of cerebral amyloid angiopathy can not be entirely excluded. 4. Moderate chronic white matter microangiopathy, advanced for age. 5. Mild focal stenosis of the left A2 segment. Otherwise, patent intracranial vasculature. 6. Patent vasculature of the neck. Electronically Signed   By: Valetta Mole M.D.   On: 05/31/2021 17:34   CT HEAD CODE STROKE WO CONTRAST  Result Date: 05/31/2021 CLINICAL DATA:  Code stroke.  Neuro deficit, acute, stroke suspected EXAM: CT HEAD WITHOUT CONTRAST TECHNIQUE: Contiguous axial images were obtained from the base of the skull through the vertex without intravenous contrast. COMPARISON:  CT head 02/07/2021. FINDINGS: Brain: No evidence of acute large vascular territory infarction, hemorrhage, hydrocephalus, extra-axial collection or mass lesion/mass effect. Small remote lacunar infarcts versus dilated perivascular spaces in bilateral basal ganglia. Additional patchy white matter hypodensities, nonspecific but compatible with chronic microvascular ischemic disease Vascular: No hyperdense vessel identified. Skull: Small high posterior scalp contusion.  No acute fracture. Sinuses/Orbits: Clear sinuses.  Unremarkable orbits. Other: No  mastoid effusions. ASPECTS [facility] Stroke Program Early CT Score) total score (0-10 with 10 being normal): 10. IMPRESSION: 1. No evidence of acute large vascular territory infarct or acute hemorrhage. ASPECTS is 10. 2. Small high posterior scalp contusion. Code stroke imaging results were communicated on 05/31/2021 at 3:36 pm to provider Dr. Leonel Ramsay via secure text paging. Electronically Signed   By: Margaretha Sheffield M.D.   On: 05/31/2021 15:37     Rise Patience, DO 05/31/2021, 5:44 PM PGY-2, [place] [facility] pager: [phone], text pages welcome

## 2021-05-31 NOTE — Progress Notes (Signed)
Patient gone to MRI. Will retry for EEG later.

## 2021-05-31 NOTE — ED Notes (Signed)
Ambulated to restroom without assistance 

## 2021-05-31 NOTE — ED Notes (Signed)
EEG at bedside at this time.

## 2021-05-31 NOTE — ED Notes (Signed)
Patient transported to MRI 

## 2021-05-31 NOTE — ED Provider Notes (Addendum)
[facility] EMERGENCY DEPARTMENT Provider Note   CSN: [id number] Arrival date & time: 05/31/21  1517  An emergency department physician performed an initial assessment on this suspected stroke patient at 54.  History Chief Complaint  Patient presents with   Code Stroke    Jeffrey Zuniga is a 65 y.o. male.  Patient presents ER as a stroke activation.  Apparently from dialysis.  Patient was at the end of his dialysis with about 50 minutes left when he became unresponsive.  When he finally woke up staff states that he was combative and altered and had what he described as "word salad."  Symptoms lasted for about 15 minutes, however by the time he arrived to the ER he had resolved and he has normal baseline mental status and normal speech pattern now.  No reports of any new weakness or numbness.  Denies any headache or chest pain or abdominal pain.      Past Medical History:  Diagnosis Date   Acute on chronic kidney failure ([place])    Anemia 04/04/2020   Blood transfusion without reported diagnosis    Diabetes mellitus without complication ([place])    GI bleed    H. 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 associated with type 2 diabetes mellitus ([place]) 04/06/2021   Severe nonproliferative diabetic retinopathy of left eye, with macular edema, associated with type 2 diabetes mellitus ([place]) 04/06/2021   Nuclear sclerotic cataract of right eye 04/06/2021   Posterior subcapsular age-related cataract, right eye 04/06/2021   Vitreomacular traction syndrome, right 04/06/2021   Gastritis and duodenitis 03/28/2021   Chronic hepatitis C without hepatic coma ([place]) 03/03/2021   Anemia  associated with chronic renal failure    Thrombocytopenia ([facility])    Vision loss of right eye    Acute on chronic heart failure ([place]) 10/28/2020   LVH (left ventricular hypertrophy) 04/04/2020   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 04/01/2020   Essential hypertension 04/01/2020    Past Surgical History:  Procedure Laterality Date   AV FISTULA PLACEMENT Left 02/10/2021   Procedure: LEFT UPPER EXTREMITY ARTERIOVENOUS (AV) FISTULA  CREATION;  Surgeon: Waynetta Sandy, MD;  Location: [facility];  Service: Vascular;  Laterality: Left;   BIOPSY  08/31/2020   Procedure: BIOPSY;  Surgeon: Irving Copas., MD;  Location[facility] END[facility]COPY;  Service: Gastroenterology;;   BIOPSY  03/06/2021   Procedure: BIOPSY;  Surgeon: Jerene Bears, MD;  Location: [facility] ENDOSCOPY;  Service: Gastroenterology;;   CARDIAC CATHETERIZATION     COLONOSCOPY WITH PROPOFOL N/A 08/31/2020   Procedure: COLONOSCOPY WITH PROPOFOL;  Surgeon: Irving Copas., MD;  Location: [facility] ENDOSCOPY;  Service: Gastroenterology;  Laterality: N/A;   ENTEROSCOPY N/A 03/06/2021   Procedure: ENTEROSCOPY;  Surgeon: Jerene Bears, MD;  Location: [facility] ENDOSCOPY;  Service: Gastroenterology;  Laterality: N/A;   ESOPHAGOGASTRODUODENOSCOPY (EGD) WITH PROPOFOL N/A 08/31/2020   Procedure: ESOPHAGOGASTRODUODENOSCOPY (EGD) WITH PROPOFOL;  Surgeon: Rush Landmark Telford Nab., MD;  Location: [facility];  Service: Gastroenterology;  Laterality: N/A;   GIVENS CAPSULE STUDY N/A 03/04/2021   Procedure: GIVENS CAPSULE STUDY;  Surgeon: Gatha Mayer, MD;  Location: [facility];  Service: Endoscopy;  Laterality: N/A;   HEMOSTASIS CLIP  PLACEMENT  03/06/2021   Procedure: HEMOSTASIS CLIP PLACEMENT;  Surgeon: Jerene Bears, MD;  Location: [facility];  Service: Gastroenterology;;   HOT HEMOSTASIS N/A 03/06/2021   Procedure: HOT HEMOSTASIS (ARGON PLASMA COAGULATION/BICAP);  Surgeon: Jerene Bears, MD;  Location: [facility] ENDOSCOPY;  Service: Gastroenterology;  Laterality:  N/A;   IR FLUORO GUIDE CV LINE RIGHT  02/09/2021   IR US GUIDE VASC ACCESS RIGHT  02/09/2021   POLYPECTOMY  08/31/2020   Procedure: POLYPECTOMY;  Surgeon: Mansouraty, Telford Nab., MD;  Location: [facility] ENDOSCOPY;  Service: Gastroenterology;;       Family History  Problem Relation Age of Onset   Liver disease Neg Hx    Liver cancer Neg Hx     Social History   Tobacco Use   Smoking status: Every Day    Packs/day: 0.25    Types: Cigarettes   Smokeless tobacco: Never   Tobacco comments:    2021  " i AM CUTTING BACK :"  Vaping Use   Vaping Use: Never used  Substance Use Topics   Alcohol use: Not Currently   Drug use: Never    Home Medications Prior to Admission medications   Medication Sig Start Date End Date Taking? Authorizing Provider  amLODipine (NORVASC) 10 MG tablet Take 1 tablet (10 mg total) by mouth daily. 03/28/21   Eppie Gibson, MD  Darbepoetin Alfa (ARANESP) 200 MCG/0.4ML SOSY injection Inject 0.4 mLs (200 mcg total) into the vein every Monday with hemodialysis. 03/13/21   Scarlett Presto, MD  ferrous sulfate 325 (65 FE) MG tablet Take 1 tablet (325 mg total) by mouth daily with breakfast. 03/28/21   Eppie Gibson, MD  isosorbide mononitrate (IMDUR) 60 MG 24 hr tablet Take 1 tablet (60 mg total) by mouth daily. 03/28/21   Eppie Gibson, MD  labetalol (NORMODYNE) 100 MG tablet Take 2 tablets (200 mg total) by mouth 2 (two) times daily. 03/28/21   Eppie Gibson, MD  latanoprost (XALATAN) 0.005 % ophthalmic solution Place 1 drop into the left eye at bedtime. 03/06/21   Farrel Gordon, DO  pantoprazole (PROTONIX) 40 MG tablet Take 1 tablet (40 mg total) by mouth daily. 03/28/21   Eppie Gibson, MD  Sofosbuvir-Velpatasvir (EPCLUSA) 400-100 MG TABS Take 1 tablet by mouth daily. 03/09/21   Kuppelweiser, Cassie L, RPH-CPP    Allergies    Patient has no known allergies.  Review of Systems   Review of Systems  Constitutional:  Negative for fever.  HENT:  Negative for ear  pain and sore throat.   Eyes:  Negative for pain.  Respiratory:  Negative for cough.   Cardiovascular:  Negative for chest pain.  Gastrointestinal:  Negative for abdominal pain.  Genitourinary:  Negative for flank pain.  Musculoskeletal:  Negative for back pain.  Skin:  Negative for color change and rash.  Neurological:  Negative for syncope.  All other systems reviewed and are negative.  Physical Exam Updated Vital Signs BP (!) 202/84    Pulse 77    Resp 15    Ht 5\' 7"  (1.702 m)    Wt 73.2 kg    SpO2 100%    BMI 25.28 kg/m   Physical Exam Constitutional:      Appearance: He is well-developed.  HENT:     Head: Normocephalic.     Nose: Nose normal.  Eyes:     Extraocular Movements: Extraocular movements intact.  Cardiovascular:     Rate and Rhythm: Normal rate.  Pulmonary:  Effort: Pulmonary effort is normal.  Skin:    Coloration: Skin is not jaundiced.  Neurological:     Mental Status: He is alert and oriented to person, place, and time. Mental status is at baseline.     Cranial Nerves: No cranial nerve deficit.     Motor: No weakness.    ED Results / Procedures / Treatments   Labs (all labs ordered are listed, but only abnormal results are displayed) Labs Reviewed  CBC - Abnormal; Notable for the following components:      Result Value   RBC 2.82 (*)    Hemoglobin 8.4 (*)    HCT 26.4 (*)    RDW 17.1 (*)    Platelets 62 (*)    All other components within normal limits  DIFFERENTIAL - Abnormal; Notable for the following components:   Lymphs Abs 0.6 (*)    All other components within normal limits  COMPREHENSIVE METABOLIC PANEL - Abnormal; Notable for the following components:   Potassium 3.3 (*)    Glucose, Bld 146 (*)    Creatinine, Ser 3.58 (*)    Calcium 8.0 (*)    GFR, Estimated 18 (*)    All other components within normal limits  I-STAT CHEM 8, ED - Abnormal; Notable for the following components:   Potassium 3.3 (*)    Creatinine, Ser 3.80 (*)     Glucose, Bld 145 (*)    Calcium, Ion 0.93 (*)    Hemoglobin 9.2 (*)    HCT 27.0 (*)    All other components within normal limits  CBG MONITORING, ED - Abnormal; Notable for the following components:   Glucose-Capillary 141 (*)    All other components within normal limits  PROTIME-INR  APTT    EKG EKG Interpretation  Date/Time:  Wednesday May 31 2021 15:42:16 EST Ventricular Rate:  82 PR Interval:  188 QRS Duration: 101 QT Interval:  426 QTC Calculation: 498 R Axis:   41 Text Interpretation: Sinus rhythm LVH with secondary repolarization abnormality Borderline prolonged QT interval Confirmed by Thamas Jaegers ([id number]) on 05/31/2021 5:32:05 PM  Radiology MR ANGIO HEAD WO CONTRAST  Result Date: 05/31/2021 CLINICAL DATA:  Neuro deficit, stroke suspected EXAM: MRI HEAD WITHOUT CONTRAST MRA HEAD WITHOUT CONTRAST MRA NECK WITHOUT CONTRAST TECHNIQUE: Multiplanar, multiecho pulse sequences of the brain and surrounding structures were obtained without intravenous contrast. Angiographic images of the Circle of Willis were obtained using MRA technique without intravenous contrast. Angiographic images of the neck were obtained using MRA technique without intravenous contrast. Carotid stenosis measurements (when applicable) are obtained utilizing NASCET criteria, using the distal internal carotid diameter as the denominator. COMPARISON:  Same-day noncontrast CT head FINDINGS: MRI HEAD FINDINGS Brain: There is no acute intracranial hemorrhage, extra-axial fluid collection, or acute infarct. Parenchymal volume is normal. The ventricles are normal in size. There is patchy and confluent FLAIR signal abnormality throughout the subcortical and periventricular white matter likely reflecting sequela of moderate chronic white matter microangiopathy, advanced for age. There are small remote lacunar infarcts in the bilateral basal ganglia and left caudate head. Additional small remote infarct is seen in the  right cerebral peduncle extending to the pons. There are scattered punctate chronic microhemorrhages including in the left caudate head, right lentiform nucleus, and right parietal and occipital lobes. There is no solid mass lesion.  There is no midline shift. Vascular: Normal flow voids. Skull and upper cervical spine: Normal marrow signal. Sinuses/Orbits: There is mild mucosal thickening in the paranasal sinuses. There  is T2 hyperintensity in the left optic nerve (10-24, 10-27). Other: None. MRA HEAD FINDINGS Anterior circulation: The intracranial ICAs are patent. The bilateral MCAs are patent There is mild focal stenosis of the left A2 segment (3-156). The ACAs are otherwise patent. The anterior communicating artery is patent. There is no aneurysm. Posterior circulation: The bilateral V4 segments are patent. The basilar artery is patent. The bilateral PCAs are patent. The left posterior communicating artery is identified. The right posterior communicating artery is not definitely seen. There is no aneurysm. Anatomic variants: None. MRA NECK FINDINGS Aortic arch: The imaged portions of the aortic arch are grossly unremarkable, though suboptimally evaluated. The origins of the major branch vessels are patent. Right carotid system: Right common, internal, and external carotid arteries are patent, without hemodynamically significant stenosis or occlusion. There is no evidence of dissection or aneurysm. Left carotid system: The left common, internal, and external carotid arteries are patent, without hemodynamically significant stenosis or occlusion. There is no evidence of dissection or aneurysm. Vertebral arteries: Vertebral arteries are patent with antegrade flow. There is no evidence of hemodynamically significant stenosis or occlusion. There is no evidence of dissection or aneurysm. Other: None IMPRESSION: 1. No acute intracranial pathology. 2. Signal abnormality in the left optic nerve may reflect sequela of  prior optic neuritis in the absence of acute symptoms. 3. Scattered punctate chronic microhemorrhages, some of which are in a peripheral distribution, nonspecific. Sequela of cerebral amyloid angiopathy can not be entirely excluded. 4. Moderate chronic white matter microangiopathy, advanced for age. 5. Mild focal stenosis of the left A2 segment. Otherwise, patent intracranial vasculature. 6. Patent vasculature of the neck. Electronically Signed   By: Valetta Mole M.D.   On: 05/31/2021 17:34   MR ANGIO NECK WO CONTRAST  Result Date: 05/31/2021 CLINICAL DATA:  Neuro deficit, stroke suspected EXAM: MRI HEAD WITHOUT CONTRAST MRA HEAD WITHOUT CONTRAST MRA NECK WITHOUT CONTRAST TECHNIQUE: Multiplanar, multiecho pulse sequences of the brain and surrounding structures were obtained without intravenous contrast. Angiographic images of the Circle of Willis were obtained using MRA technique without intravenous contrast. Angiographic images of the neck were obtained using MRA technique without intravenous contrast. Carotid stenosis measurements (when applicable) are obtained utilizing NASCET criteria, using the distal internal carotid diameter as the denominator. COMPARISON:  Same-day noncontrast CT head FINDINGS: MRI HEAD FINDINGS Brain: There is no acute intracranial hemorrhage, extra-axial fluid collection, or acute infarct. Parenchymal volume is normal. The ventricles are normal in size. There is patchy and confluent FLAIR signal abnormality throughout the subcortical and periventricular white matter likely reflecting sequela of moderate chronic white matter microangiopathy, advanced for age. There are small remote lacunar infarcts in the bilateral basal ganglia and left caudate head. Additional small remote infarct is seen in the right cerebral peduncle extending to the pons. There are scattered punctate chronic microhemorrhages including in the left caudate head, right lentiform nucleus, and right parietal and  occipital lobes. There is no solid mass lesion.  There is no midline shift. Vascular: Normal flow voids. Skull and upper cervical spine: Normal marrow signal. Sinuses/Orbits: There is mild mucosal thickening in the paranasal sinuses. There is T2 hyperintensity in the left optic nerve (10-24, 10-27). Other: None. MRA HEAD FINDINGS Anterior circulation: The intracranial ICAs are patent. The bilateral MCAs are patent There is mild focal stenosis of the left A2 segment (3-156). The ACAs are otherwise patent. The anterior communicating artery is patent. There is no aneurysm. Posterior circulation: The bilateral V4 segments are patent. The  basilar artery is patent. The bilateral PCAs are patent. The left posterior communicating artery is identified. The right posterior communicating artery is not definitely seen. There is no aneurysm. Anatomic variants: None. MRA NECK FINDINGS Aortic arch: The imaged portions of the aortic arch are grossly unremarkable, though suboptimally evaluated. The origins of the major branch vessels are patent. Right carotid system: Right common, internal, and external carotid arteries are patent, without hemodynamically significant stenosis or occlusion. There is no evidence of dissection or aneurysm. Left carotid system: The left common, internal, and external carotid arteries are patent, without hemodynamically significant stenosis or occlusion. There is no evidence of dissection or aneurysm. Vertebral arteries: Vertebral arteries are patent with antegrade flow. There is no evidence of hemodynamically significant stenosis or occlusion. There is no evidence of dissection or aneurysm. Other: None IMPRESSION: 1. No acute intracranial pathology. 2. Signal abnormality in the left optic nerve may reflect sequela of prior optic neuritis in the absence of acute symptoms. 3. Scattered punctate chronic microhemorrhages, some of which are in a peripheral distribution, nonspecific. Sequela of cerebral  amyloid angiopathy can not be entirely excluded. 4. Moderate chronic white matter microangiopathy, advanced for age. 5. Mild focal stenosis of the left A2 segment. Otherwise, patent intracranial vasculature. 6. Patent vasculature of the neck. Electronically Signed   By: Valetta Mole M.D.   On: 05/31/2021 17:34   MR BRAIN WO CONTRAST  Result Date: 05/31/2021 CLINICAL DATA:  Neuro deficit, stroke suspected EXAM: MRI HEAD WITHOUT CONTRAST MRA HEAD WITHOUT CONTRAST MRA NECK WITHOUT CONTRAST TECHNIQUE: Multiplanar, multiecho pulse sequences of the brain and surrounding structures were obtained without intravenous contrast. Angiographic images of the Circle of Willis were obtained using MRA technique without intravenous contrast. Angiographic images of the neck were obtained using MRA technique without intravenous contrast. Carotid stenosis measurements (when applicable) are obtained utilizing NASCET criteria, using the distal internal carotid diameter as the denominator. COMPARISON:  Same-day noncontrast CT head FINDINGS: MRI HEAD FINDINGS Brain: There is no acute intracranial hemorrhage, extra-axial fluid collection, or acute infarct. Parenchymal volume is normal. The ventricles are normal in size. There is patchy and confluent FLAIR signal abnormality throughout the subcortical and periventricular white matter likely reflecting sequela of moderate chronic white matter microangiopathy, advanced for age. There are small remote lacunar infarcts in the bilateral basal ganglia and left caudate head. Additional small remote infarct is seen in the right cerebral peduncle extending to the pons. There are scattered punctate chronic microhemorrhages including in the left caudate head, right lentiform nucleus, and right parietal and occipital lobes. There is no solid mass lesion.  There is no midline shift. Vascular: Normal flow voids. Skull and upper cervical spine: Normal marrow signal. Sinuses/Orbits: There is mild  mucosal thickening in the paranasal sinuses. There is T2 hyperintensity in the left optic nerve (10-24, 10-27). Other: None. MRA HEAD FINDINGS Anterior circulation: The intracranial ICAs are patent. The bilateral MCAs are patent There is mild focal stenosis of the left A2 segment (3-156). The ACAs are otherwise patent. The anterior communicating artery is patent. There is no aneurysm. Posterior circulation: The bilateral V4 segments are patent. The basilar artery is patent. The bilateral PCAs are patent. The left posterior communicating artery is identified. The right posterior communicating artery is not definitely seen. There is no aneurysm. Anatomic variants: None. MRA NECK FINDINGS Aortic arch: The imaged portions of the aortic arch are grossly unremarkable, though suboptimally evaluated. The origins of the major branch vessels are patent. Right carotid system: Right  common, internal, and external carotid arteries are patent, without hemodynamically significant stenosis or occlusion. There is no evidence of dissection or aneurysm. Left carotid system: The left common, internal, and external carotid arteries are patent, without hemodynamically significant stenosis or occlusion. There is no evidence of dissection or aneurysm. Vertebral arteries: Vertebral arteries are patent with antegrade flow. There is no evidence of hemodynamically significant stenosis or occlusion. There is no evidence of dissection or aneurysm. Other: None IMPRESSION: 1. No acute intracranial pathology. 2. Signal abnormality in the left optic nerve may reflect sequela of prior optic neuritis in the absence of acute symptoms. 3. Scattered punctate chronic microhemorrhages, some of which are in a peripheral distribution, nonspecific. Sequela of cerebral amyloid angiopathy can not be entirely excluded. 4. Moderate chronic white matter microangiopathy, advanced for age. 5. Mild focal stenosis of the left A2 segment. Otherwise, patent intracranial  vasculature. 6. Patent vasculature of the neck. Electronically Signed   By: Valetta Mole M.D.   On: 05/31/2021 17:34   CT HEAD CODE STROKE WO CONTRAST  Result Date: 05/31/2021 CLINICAL DATA:  Code stroke.  Neuro deficit, acute, stroke suspected EXAM: CT HEAD WITHOUT CONTRAST TECHNIQUE: Contiguous axial images were obtained from the base of the skull through the vertex without intravenous contrast. COMPARISON:  CT head 02/07/2021. FINDINGS: Brain: No evidence of acute large vascular territory infarction, hemorrhage, hydrocephalus, extra-axial collection or mass lesion/mass effect. Small remote lacunar infarcts versus dilated perivascular spaces in bilateral basal ganglia. Additional patchy white matter hypodensities, nonspecific but compatible with chronic microvascular ischemic disease Vascular: No hyperdense vessel identified. Skull: Small high posterior scalp contusion.  No acute fracture. Sinuses/Orbits: Clear sinuses.  Unremarkable orbits. Other: No mastoid effusions. ASPECTS [facility] Stroke Program Early CT Score) total score (0-10 with 10 being normal): 10. IMPRESSION: 1. No evidence of acute large vascular territory infarct or acute hemorrhage. ASPECTS is 10. 2. Small high posterior scalp contusion. Code stroke imaging results were communicated on 05/31/2021 at 3:36 pm to provider Dr. Leonel Ramsay via secure text paging. Electronically Signed   By: Margaretha Sheffield M.D.   On: 05/31/2021 15:37    Procedures .Critical Care Performed by: Luna Fuse, MD Authorized by: Luna Fuse, MD   Critical care provider statement:    Critical care time (minutes):  40   Critical care time was exclusive of:  Teaching time and separately billable procedures and treating other patients   Critical care was necessary to treat or prevent imminent or life-threatening deterioration of the following conditions:  CNS failure or compromise   Medications Ordered in ED Medications  labetalol (NORMODYNE) injection  10 mg (has no administration in time range)  sodium chloride flush (NS) 0.9 % injection 3 mL (3 mLs Intravenous Given 05/31/21 1624)    ED Course  I have reviewed the triage vital signs and the nursing notes.  Pertinent labs & imaging results that were available during my care of the patient were reviewed by me and considered in my medical decision making (see chart for details).    MDM Rules/Calculators/A&P                         Due to his history, patient was activated as a stroke alert prior to arrival.  He was seen by the stroke team.  No tPA given as his symptoms have resolved.  Patient labs been sent.  Patient maintains neuro status at his baseline still.  Neurology recommends MRI imaging, EEG and  observation admission.      Final Clinical Impression(s) / ED Diagnoses Final diagnoses:  Episode of confusion  Hypertension, unspecified type    Rx / DC Orders ED Discharge Orders     None        Luna Fuse, MD 05/31/21 1610    Luna Fuse, MD 05/31/21 [phone]

## 2021-05-31 NOTE — ED Notes (Signed)
Received verbal report from Kasey C RN at this time °

## 2021-06-01 ENCOUNTER — Telehealth: Payer: Self-pay | Admitting: Pharmacist

## 2021-06-01 ENCOUNTER — Encounter (HOSPITAL_COMMUNITY): Payer: Self-pay | Admitting: Family Medicine

## 2021-06-01 DIAGNOSIS — R4182 Altered mental status, unspecified: Secondary | ICD-10-CM

## 2021-06-01 DIAGNOSIS — I5030 Unspecified diastolic (congestive) heart failure: Secondary | ICD-10-CM

## 2021-06-01 DIAGNOSIS — R410 Disorientation, unspecified: Secondary | ICD-10-CM | POA: Diagnosis not present

## 2021-06-01 DIAGNOSIS — R404 Transient alteration of awareness: Secondary | ICD-10-CM

## 2021-06-01 HISTORY — DX: Unspecified diastolic (congestive) heart failure: I50.30

## 2021-06-01 LAB — GLUCOSE, CAPILLARY
Glucose-Capillary: 85 mg/dL (ref 70–99)
Glucose-Capillary: 63 mg/dL — ABNORMAL LOW (ref 70–99)

## 2021-06-01 LAB — CBC WITH DIFFERENTIAL/PLATELET
Abs Immature Granulocytes: 0.01 10*3/uL (ref 0.00–0.07)
Basophils Absolute: 0 10*3/uL (ref 0.0–0.1)
Basophils Relative: 0 %
Eosinophils Absolute: 0.1 10*3/uL (ref 0.0–0.5)
Eosinophils Relative: 2 %
HCT: 24.7 % — ABNORMAL LOW (ref 39.0–52.0)
Hemoglobin: 8.1 g/dL — ABNORMAL LOW (ref 13.0–17.0)
Immature Granulocytes: 0 %
Lymphocytes Relative: 15 %
Lymphs Abs: 0.9 10*3/uL (ref 0.7–4.0)
MCH: 30.3 pg (ref 26.0–34.0)
MCHC: 32.8 g/dL (ref 30.0–36.0)
MCV: 92.5 fL (ref 80.0–100.0)
Monocytes Absolute: 0.6 10*3/uL (ref 0.1–1.0)
Monocytes Relative: 9 %
Neutro Abs: 4.6 10*3/uL (ref 1.7–7.7)
Neutrophils Relative %: 74 %
Platelets: 72 10*3/uL — ABNORMAL LOW (ref 150–400)
RBC: 2.67 MIL/uL — ABNORMAL LOW (ref 4.22–5.81)
RDW: 16.9 % — ABNORMAL HIGH (ref 11.5–15.5)
WBC: 6.2 10*3/uL (ref 4.0–10.5)
nRBC: 0 % (ref 0.0–0.2)

## 2021-06-01 LAB — HEMOGLOBIN A1C
Hgb A1c MFr Bld: 4.1 % — ABNORMAL LOW (ref 4.8–5.6)
Mean Plasma Glucose: 70.97 mg/dL

## 2021-06-01 LAB — COMPREHENSIVE METABOLIC PANEL
ALT: 12 U/L (ref 0–44)
AST: 21 U/L (ref 15–41)
Albumin: 3.2 g/dL — ABNORMAL LOW (ref 3.5–5.0)
Alkaline Phosphatase: 69 U/L (ref 38–126)
Anion gap: 8 (ref 5–15)
BUN: 19 mg/dL (ref 8–23)
CO2: 26 mmol/L (ref 22–32)
Calcium: 7.8 mg/dL — ABNORMAL LOW (ref 8.9–10.3)
Chloride: 100 mmol/L (ref 98–111)
Creatinine, Ser: 4.98 mg/dL — ABNORMAL HIGH (ref 0.61–1.24)
GFR, Estimated: 12 mL/min — ABNORMAL LOW (ref >60)
Glucose, Bld: 94 mg/dL (ref 70–99)
Potassium: 4.1 mmol/L (ref 3.5–5.1)
Sodium: 134 mmol/L — ABNORMAL LOW (ref 135–145)
Total Bilirubin: 0.3 mg/dL (ref 0.3–1.2)
Total Protein: 7 g/dL (ref 6.5–8.1)

## 2021-06-01 LAB — TSH: TSH: 1.975 u[IU]/mL (ref 0.350–4.500)

## 2021-06-01 MED ORDER — CETAPHIL MOISTURIZING EX LOTN
TOPICAL_LOTION | Freq: Two times a day (BID) | CUTANEOUS | Status: DC
  Filled 2021-06-01: qty 473

## 2021-06-01 MED ORDER — LIDOCAINE HCL (PF) 1 % IJ SOLN: 5.0000 mL | INTRAMUSCULAR | Status: DC | PRN

## 2021-06-01 MED ORDER — LIDOCAINE-PRILOCAINE 2.5-2.5 % EX CREA: 1.0000 "application " | TOPICAL_CREAM | CUTANEOUS | Status: DC | PRN

## 2021-06-01 MED ORDER — SODIUM CHLORIDE 0.9 % IV SOLN: 100.0000 mL | INTRAVENOUS | Status: DC | PRN

## 2021-06-01 MED ORDER — CHLORHEXIDINE GLUCONATE CLOTH 2 % EX PADS
6.0000 | MEDICATED_PAD | Freq: Every day | CUTANEOUS | Status: DC
  Administered 2021-06-01 – 2021-06-02 (×2): 6 via TOPICAL

## 2021-06-01 MED ORDER — DOXERCALCIFEROL 4 MCG/2ML IV SOLN
1.5000 ug | INTRAVENOUS | Status: DC
  Filled 2021-06-01: qty 2

## 2021-06-01 MED ORDER — PENTAFLUOROPROP-TETRAFLUOROETH EX AERO: 1.0000 "application " | INHALATION_SPRAY | CUTANEOUS | Status: DC | PRN

## 2021-06-01 MED ORDER — ALTEPLASE 2 MG IJ SOLR
2.0000 mg | Freq: Once | INTRAMUSCULAR | Status: DC | PRN
  Filled 2021-06-01: qty 2

## 2021-06-01 MED ORDER — HEPARIN SODIUM (PORCINE) 1000 UNIT/ML DIALYSIS
1000.0000 [IU] | INTRAMUSCULAR | Status: DC | PRN
  Administered 2021-06-02: 1000 [IU] via INTRAVENOUS_CENTRAL
  Filled 2021-06-01 (×3): qty 1

## 2021-06-01 NOTE — Telephone Encounter (Signed)
Jeffrey Zuniga, inpatient clinical pharmacist, reached out to me regarding patient's Hepatitis C therapy. He is prescribed Epclusa for 12 weeks. We were unable to contact him to see when he started. Per Kenney Houseman, patient started on 9/26 and has missed 3 doses so far. Ordered a Hepatitis C RNA while he is inpatient. He will need follow up at [facility] once he is discharged.  Len Kluver L. Delford Wingert, PharmD [facility] Clinical Pharmacist Practitioner

## 2021-06-01 NOTE — Progress Notes (Signed)
°  Transition of Care [facility]) Screening Note   Patient Details  Name: Ladarian Bonczek Date of Birth: August 08, 1955   [facility] of [facility]) CM/SW Contact:    Pollie Friar, RN Phone Number: 06/01/2021, 3:14 PM    Transition of Care Department [facility]) has reviewed patient and no TOC needs have been identified at this time. We will continue to monitor patient advancement through interdisciplinary progression rounds. If new patient transition needs arise, please place a TOC consult.

## 2021-06-01 NOTE — Plan of Care (Signed)
°  Problem: Education: Goal: Knowledge of disease or condition will improve Outcome: Progressing Goal: Knowledge of secondary prevention will improve (SELECT ALL) Outcome: Progressing Goal: Knowledge of patient specific risk factors will improve (INDIVIDUALIZE FOR PATIENT) Outcome: Progressing   Problem: Coping: Goal: Will verbalize positive feelings about self Outcome: Progressing   Problem: Ischemic Stroke/TIA Tissue Perfusion: Goal: Complications of ischemic stroke/TIA will be minimized Outcome: Progressing

## 2021-06-01 NOTE — ED Notes (Signed)
Received purple man. Message sent to Arthor Captain LPN at this time

## 2021-06-01 NOTE — Procedures (Signed)
Patient Name: Jeffrey Zuniga  MRN: [id number]  Epilepsy Attending: Lora Havens  Referring Physician/Provider: Rikki Spearing, NP Date: 05/31/2021  Duration: 23.14 mins  Patient history: 65 year old male with transient alteration of awareness during hemodialysis.  EEG to evaluate for seizure.  Level of alertness: Awake, drowsy  AEDs during EEG study: None  Technical aspects: This EEG study was done with scalp electrodes positioned according to the 10-20 International system of electrode placement. Electrical activity was acquired at a sampling rate of 500Hz  and reviewed with a high frequency filter of 70Hz  and a low frequency filter of 1Hz . EEG data were recorded continuously and digitally stored.   Description: The posterior dominant rhythm consists of 9 Hz activity of moderate voltage (25-35 uV) seen predominantly in posterior head regions, symmetric and reactive to eye opening and eye closing. Drowsiness was characterized by attenuation of the posterior background rhythm. Hyperventilation and photic stimulation were not performed.     IMPRESSION: This study is within normal limits. No seizures or epileptiform discharges were seen throughout the recording.  Rama Sorci Barbra Sarks

## 2021-06-01 NOTE — ED Notes (Signed)
Pt requesting something to eat and drink. Previously seen order for pt to be NPO however there is an order for diet in the computer. Admit provider paged to clarify

## 2021-06-01 NOTE — Progress Notes (Addendum)
Family Medicine Teaching Service Daily Progress Note Intern Pager: [phone]  Patient name: Jeffrey Zuniga Medical record number: [id number] Date of birth: 04/27/1956 Age: 65 y.o. Gender: male  Primary Care Provider: Eppie Gibson, MD Consultants: Neurology, Nephrology  Code Status: Full  Pt Overview and Major Events to Date:  12/21: Admitted to FPTS   Assessment and Plan:  Jeffrey Zuniga is a 65 y.o. male presenting with altered mental status episode at the end of HD. PMH is significant for HTN, ESRD on HD MWF, Hepatitis C, anemia, small bowel AVM, HFpEF, T2DM diet controlled, glaucoma.  Acute Encephalopathy   Stroke vs syncope vs TIA  Patient has had a few episodes of altered mental status during HD.  CT scan and MRI that were obtained showed no evidence of acute stroke or general acute pathology.  Neurology has been consulted and performed an EEG. Continues to have a wide differential given negative findings thus far and presented symptoms. Differential includes: HTN encephalopathy vs dialysis disequilibrium syndrome vs PRES vs seizure. Pt's encephalopathy is completely resolved. HA1C 4.1, TSH 1.975, lipid panel: LDL 105, triglycerides 68, HDL 46. EEG negative.  -- Frequent neuro checks  -- PT/OT evaluate and treat  -- continuous cardiac monitoring   HTN BP range: 170s-190s/60s-80s. Home medications include: Amlodipine 10 mg daily, Coreg 12.5 mg, Imdur 60 mg daily.  -- Close BP monitoring  -- Currently on Amlodipine, Coreg (increase Coreg), Hydralazine, Imdur     ESRD on HD MWF  Episodes occur while at hemodialysis, patient has not missed any sessions.  We have consulted nephrology given his negative findings from a neuro standpoint.  Additionally, patient is persistently hypertensive and has been while in dialysis.  Appreciate recommendations about blood pressure management from nephrology. Hgb 8.4>8.1, plt 62>72, electrolytes-Na 134.  -- Nephrology consulted, appreciate  recommendations  -- Monitoring BMP/CBC   Hepatitis C, stable  Currently on Epclusa, follows with ID. Started on 9/26 and was almost completely compliant, last dose 12/21. Missed three doses (11/24-11/27).  H/o of anemia   Small bowel AVM   H/o H. Pylori gastritis  Hgb remains stable today Hgb 8.4>8.1 with baseline of 8-9.  -Continuing home iron supplement   HFpEF   G2DD  UOP: 800 ml. Home medications include Coreg 12.5 mg, increase to BID.  -- Continue home Coreg 12.5 mg BID -- Daily weights  -- Strict I&Os   DM2  A1C 4.1. CBGs 141>85. Diet-controlled, no need for insulin at this time.  -Monitor CBGs via BMP   Glaucoma   Cataracts, chronic and stable  Follows with ophthalmology.   FEN/GI: Renal diet w/ fluid restriction  PPx: Heparin  Dispo:Home  1-2 days . Barriers include nephrology recommendations.   Subjective:  Pt is doing well this morning and denies any residual symptoms. Denies SOB or CP.   Objective: Temp:  [98.3 F (36.8 C)-98.8 F (37.1 C)] 98.8 F (37.1 C) (12/22 1136) Pulse Rate:  [69-84] 79 (12/22 1136) Resp:  [0-21] 20 (12/22 1136) BP: (142-202)/(45-89) 142/45 (12/22 1136) SpO2:  [94 %-100 %] 100 % (12/22 1136) Weight:  [73.2 kg] 73.2 kg (12/21 1719) Physical Exam: General: NAD, sitting up in his chair  Cardiovascular: RRR Respiratory: CTAB  Abdomen: Nontender  Extremities: FROM  Neuro: No focal deficits, CN II through XII intact   Laboratory: Recent Labs  Lab 05/31/21 1520 05/31/21 1526 06/01/21 0353  WBC 6.4  --  6.2  HGB 8.4* 9.2* 8.1*  HCT 26.4* 27.0* 24.7*  PLT 62*  --  72*   Recent Labs  Lab 05/31/21 1520 05/31/21 1526 06/01/21 0353  NA 136 139 134*  K 3.3* 3.3* 4.1  CL 100 101 100  CO2 25  --  26  BUN 12 12 19   CREATININE 3.58* 3.80* 4.98*  CALCIUM 8.0*  --  7.8*  PROT 7.7  --  7.0  BILITOT 0.5  --  0.3  ALKPHOS 75  --  69  ALT 15  --  12  AST 22  --  21  GLUCOSE 146* 145* 94      Imaging/Diagnostic Tests: EEG  negative   Erskine Emery, MD 06/01/2021, 12:03 PM PGY-1, [place] Intern pager: [phone], text pages welcome

## 2021-06-01 NOTE — Progress Notes (Signed)
Subjective: No further issues.  He does not have any left eye pain.  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  Given the negative MRI and EEG, I do not think I would favor starting antiepileptic therapy or treating this as stroke at this time.  I think that is some sort of dialysis related episode is most likely and I wonder if there are ways to gentle the treatment to see if we can avoid these episodes.  If he has any episodes outside of the context of dialysis, then I would favor starting antiepileptic therapy at that time.  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  I would not favor pursuing work-up or treatment of this at this time.  Recommendations: 1) no further neurodiagnostic testing needed at this time. 2) consider discussing with nephrology if there are ways to gentle his treatments to avoid these episodes 3) neurology will be available on an as-needed basis.  Roland Rack, MD [facility] [phone]  If 7pm- 7am, please page neurology on call as listed in [place].

## 2021-06-01 NOTE — Consult Note (Addendum)
[facility] Renal Consultation Note    Indication for Consultation:  Management of ESRD/hemodialysis; anemia, hypertension/volume and secondary hyperparathyroidism  TKP:[id number], Dorene Grebe, MD  HPI: Jeffrey Zuniga is a 65 y.o. male with ESRD on HD MWF at [facility]. His past medical history significant for HTN, Hepatitis C, anemia, small bowel AVM, HFpEF, T2DM diet controlled, and glaucoma.  Patient presents to the ED with AMS and symptoms concerning for stroke. Seen and examined patient at bedside. Patient's daughter and grand-daughter also at bedside. Per patient, he started HD in Sept 2022. He reported "passing out" 30 minutes near ending treatment on 05/31/21. He reports having two previous episodes of passing out a few months ago. He denies having hypotension during these episodes but rather hypertension. Patient unable to receive Heparin boluses during HD d/t hx AVMs thus was receiving NS flushes to prevent clotting. He also reports these episodes have occurred when receiving these flushes. Neurology on board. EEG performed. CT head (-) acute infarct, MR Angio neck and MR brain (-) acute intracranial abnormalities. Currently, resting in bed comfortably. Denies SOB, CP, palpitations, dizziness, ABD pain, and N/V. Plan for HD 06/02/21 per his usual schedule.  Past Medical History:  Diagnosis Date   Acute on chronic heart failure ([place]) 10/28/2020   Acute on chronic kidney failure ([place])    Anemia 04/04/2020   Blood transfusion without reported diagnosis    Diabetes mellitus without complication ([place])    Gastritis and duodenitis 03/28/2021   GI bleed    H. pylori infection    Heart failure with preserved ejection fraction ([place]) 06/01/2021   transthoracic echocardiogram 02/08/21: severe concentric left ventricular hypertrophy. Left ventricular diastolic  parameters are consistent with Grade II diastolic dysfunction. Elevated left ventricular end-diastolic  pressure.    Hemorrhoids    Hyperplastic colon polyp    Hypertension    Macular pucker, right eye 04/06/2021   Nuclear sclerotic cataract of right eye 04/06/2021   Posterior subcapsular age-related cataract, right eye 04/06/2021   Vitreomacular traction syndrome, right 04/06/2021   May be a component of PDR OD   Past Surgical History:  Procedure Laterality Date   AV FISTULA PLACEMENT Left 02/10/2021   Procedure: LEFT UPPER EXTREMITY ARTERIOVENOUS (AV) FISTULA  CREATION;  Surgeon: Waynetta Sandy, MD;  Location: [facility];  Service: Vascular;  Laterality: Left;   BIOPSY  08/31/2020   Procedure: BIOPSY;  Surgeon: Irving Copas., MD;  Location: [facility];  Service: Gastroenterology;;   BIOPSY  03/06/2021   Procedure: BIOPSY;  Surgeon: Jerene Bears, MD;  Location: [facility] ENDOSCOPY;  Service: Gastroenterology;;   CARDIAC CATHETERIZATION     COLONOSCOPY WITH PROPOFOL N/A 08/31/2020   Procedure: COLONOSCOPY WITH PROPOFOL;  Surgeon: Irving Copas., MD;  Location: [place];  Service: Gastroenterology;  Laterality: N/A;   ENTEROSCOPY N/A 03/06/2021   Procedure: ENTEROSCOPY;  Surgeon: Jerene Bears, MD;  Location: [facility] ENDOSCOPY;  Service: Gastroenterology;  Laterality: N/A;   ESOPHAGOGASTRODUODENOSCOPY (EGD) WITH PROPOFOL N/A 08/31/2020   Procedure: ESOPHAGOGASTRODUODENOSCOPY (EGD) WITH PROPOFOL;  Surgeon: Rush Landmark Telford Nab., MD;  Location: [facility];  Service: Gastroenterology;  Laterality: N/A;   GIVENS CAPSULE STUDY N/A 03/04/2021   Procedure: GIVENS CAPSULE STUDY;  Surgeon: Gatha Mayer, MD;  Location: [place];  Service: Endoscopy;  Laterality: N/A;   HEMOSTASIS CLIP PLACEMENT  03/06/2021   Procedure: HEMOSTASIS CLIP PLACEMENT;  Surgeon: Jerene Bears, MD;  Location: [facility];  Service: Gastroenterology;;   HOT HEMOSTASIS N/A 03/06/2021  Procedure: HOT HEMOSTASIS (ARGON PLASMA COAGULATION/BICAP);  Surgeon: Jerene Bears, MD;  Location: [facility] ENDOSCOPY;   Service: Gastroenterology;  Laterality: N/A;   IR FLUORO GUIDE CV LINE RIGHT  02/09/2021   IR US GUIDE VASC ACCESS RIGHT  02/09/2021   POLYPECTOMY  08/31/2020   Procedure: POLYPECTOMY;  Surgeon: Mansouraty, Telford Nab., MD;  Location: [facility] ENDOSCOPY;  Service: Gastroenterology;;   Family History  Problem Relation Age of Onset   Liver disease Neg Hx    Liver cancer Neg Hx    Social History:  reports that he has been smoking cigarettes. He has been smoking an average of .25 packs per day. He has never used smokeless tobacco. He reports that he does not currently use alcohol. He reports that he does not use drugs. No Known Allergies Prior to Admission medications   Medication Sig Start Date End Date Taking? Authorizing Provider  amLODipine (NORVASC) 10 MG tablet Take 1 tablet (10 mg total) by mouth daily. 03/28/21  Yes Eppie Gibson, MD  calcium acetate (PHOSLO) 667 MG capsule Take 1,334 mg by mouth 3 (three) times daily. 05/23/21  Yes [provider]  carvedilol (COREG) 12.5 MG tablet Take 12.5 mg by mouth every evening. 04/03/21  Yes [provider]  ferrous sulfate 325 (65 FE) MG tablet Take 1 tablet (325 mg total) by mouth daily with breakfast. 03/28/21  Yes Eppie Gibson, MD  hydrALAZINE (APRESOLINE) 25 MG tablet Take 25 mg by mouth 3 (three) times daily. 04/26/21  Yes [provider]  isosorbide mononitrate (IMDUR) 60 MG 24 hr tablet Take 1 tablet (60 mg total) by mouth daily. Patient taking differently: Take 60 mg by mouth every evening. 03/28/21  Yes Eppie Gibson, MD  latanoprost (XALATAN) 0.005 % ophthalmic solution Place 1 drop into the left eye at bedtime. 03/06/21  Yes Farrel Gordon, DO  pantoprazole (PROTONIX) 40 MG tablet Take 1 tablet (40 mg total) by mouth daily. 03/28/21  Yes Eppie Gibson, MD  prednisoLONE acetate (PRED FORTE) 1 % ophthalmic suspension Place 1 drop into the right eye 4 (four) times daily.   Yes [provider]   Sofosbuvir-Velpatasvir (EPCLUSA) 400-100 MG TABS Take 1 tablet by mouth daily. 03/09/21  Yes Kuppelweiser, Cassie L, RPH-CPP  Darbepoetin Alfa (ARANESP) 200 MCG/0.4ML SOSY injection Inject 0.4 mLs (200 mcg total) into the vein every Monday with hemodialysis. 03/13/21   Demaio, Alexa, MD  labetalol (NORMODYNE) 100 MG tablet Take 2 tablets (200 mg total) by mouth 2 (two) times daily. Patient not taking: Reported on 05/31/2021 03/28/21   Eppie Gibson, MD   Current Facility-Administered Medications  Medication Dose Route Frequency Provider Last Rate Last Admin   amLODipine (NORVASC) tablet 10 mg  10 mg Oral Daily Autry-Lott, Simone, DO   10 mg at 06/01/21 7510   calcium acetate (PHOSLO) capsule 1,334 mg  1,334 mg Oral TID WC Autry-Lott, Simone, DO   1,334 mg at 06/01/21 1317   carvedilol (COREG) tablet 12.5 mg  12.5 mg Oral QPM Autry-Lott, Simone, DO       cetaphil lotion   Topical BID Merrily Brittle, DO   Given at 06/01/21 1032   Chlorhexidine Gluconate Cloth 2 % PADS 6 each  6 each Topical Daily McDiarmid, Blane Ohara, MD   6 each at 06/01/21 0803   ferrous sulfate tablet 325 mg  325 mg Oral Q breakfast Autry-Lott, Simone, DO   325 mg at 06/01/21 0802   heparin injection 5,000 Units  5,000  Units Subcutaneous Q8H Lilland, Alana, DO   5,000 Units at 06/01/21 1437   hydrALAZINE (APRESOLINE) tablet 25 mg  25 mg Oral TID Autry-Lott, Simone, DO   25 mg at 06/01/21 5462   isosorbide mononitrate (IMDUR) 24 hr tablet 60 mg  60 mg Oral QPM Autry-Lott, Simone, DO       latanoprost (XALATAN) 0.005 % ophthalmic solution 1 drop  1 drop Left Eye QHS Autry-Lott, Simone, DO       pantoprazole (PROTONIX) EC tablet 40 mg  40 mg Oral Daily Autry-Lott, Simone, DO   40 mg at 06/01/21 0802   prednisoLONE acetate (PRED FORTE) 1 % ophthalmic suspension 1 drop  1 drop Right Eye QID Autry-Lott, Simone, DO   1 drop at 06/01/21 1437   Labs: Basic Metabolic Panel: Recent Labs  Lab 05/31/21 1520 05/31/21 1526 06/01/21 0353   NA 136 139 134*  K 3.3* 3.3* 4.1  CL 100 101 100  CO2 25  --  26  GLUCOSE 146* 145* 94  BUN 12 12 19   CREATININE 3.58* 3.80* 4.98*  CALCIUM 8.0*  --  7.8*   Liver Function Tests: Recent Labs  Lab 05/31/21 1520 06/01/21 0353  AST 22 21  ALT 15 12  ALKPHOS 75 69  BILITOT 0.5 0.3  PROT 7.7 7.0  ALBUMIN 3.5 3.2*   No results for input(s): LIPASE, AMYLASE in the last 168 hours. No results for input(s): AMMONIA in the last 168 hours. CBC: Recent Labs  Lab 05/31/21 1520 05/31/21 1526 06/01/21 0353  WBC 6.4  --  6.2  NEUTROABS 5.2  --  4.6  HGB 8.4* 9.2* 8.1*  HCT 26.4* 27.0* 24.7*  MCV 93.6  --  92.5  PLT 62*  --  72*   Cardiac Enzymes: No results for input(s): CKTOTAL, CKMB, CKMBINDEX, TROPONINI in the last 168 hours. CBG: Recent Labs  Lab 05/31/21 1521 06/01/21 0646 06/01/21 1229  GLUCAP 141* 85 63*   Iron Studies: No results for input(s): IRON, TIBC, TRANSFERRIN, FERRITIN in the last 72 hours. Studies/Results: MR ANGIO HEAD WO CONTRAST  Result Date: 05/31/2021 CLINICAL DATA:  Neuro deficit, stroke suspected EXAM: MRI HEAD WITHOUT CONTRAST MRA HEAD WITHOUT CONTRAST MRA NECK WITHOUT CONTRAST TECHNIQUE: Multiplanar, multiecho pulse sequences of the brain and surrounding structures were obtained without intravenous contrast. Angiographic images of the Circle of Willis were obtained using MRA technique without intravenous contrast. Angiographic images of the neck were obtained using MRA technique without intravenous contrast. Carotid stenosis measurements (when applicable) are obtained utilizing NASCET criteria, using the distal internal carotid diameter as the denominator. COMPARISON:  Same-day noncontrast CT head FINDINGS: MRI HEAD FINDINGS Brain: There is no acute intracranial hemorrhage, extra-axial fluid collection, or acute infarct. Parenchymal volume is normal. The ventricles are normal in size. There is patchy and confluent FLAIR signal abnormality throughout the  subcortical and periventricular white matter likely reflecting sequela of moderate chronic white matter microangiopathy, advanced for age. There are small remote lacunar infarcts in the bilateral basal ganglia and left caudate head. Additional small remote infarct is seen in the right cerebral peduncle extending to the pons. There are scattered punctate chronic microhemorrhages including in the left caudate head, right lentiform nucleus, and right parietal and occipital lobes. There is no solid mass lesion.  There is no midline shift. Vascular: Normal flow voids. Skull and upper cervical spine: Normal marrow signal. Sinuses/Orbits: There is mild mucosal thickening in the paranasal sinuses. There is T2 hyperintensity in the left optic nerve (10-24,  10-27). Other: None. MRA HEAD FINDINGS Anterior circulation: The intracranial ICAs are patent. The bilateral MCAs are patent There is mild focal stenosis of the left A2 segment (3-156). The ACAs are otherwise patent. The anterior communicating artery is patent. There is no aneurysm. Posterior circulation: The bilateral V4 segments are patent. The basilar artery is patent. The bilateral PCAs are patent. The left posterior communicating artery is identified. The right posterior communicating artery is not definitely seen. There is no aneurysm. Anatomic variants: None. MRA NECK FINDINGS Aortic arch: The imaged portions of the aortic arch are grossly unremarkable, though suboptimally evaluated. The origins of the major branch vessels are patent. Right carotid system: Right common, internal, and external carotid arteries are patent, without hemodynamically significant stenosis or occlusion. There is no evidence of dissection or aneurysm. Left carotid system: The left common, internal, and external carotid arteries are patent, without hemodynamically significant stenosis or occlusion. There is no evidence of dissection or aneurysm. Vertebral arteries: Vertebral arteries are  patent with antegrade flow. There is no evidence of hemodynamically significant stenosis or occlusion. There is no evidence of dissection or aneurysm. Other: None IMPRESSION: 1. No acute intracranial pathology. 2. Signal abnormality in the left optic nerve may reflect sequela of prior optic neuritis in the absence of acute symptoms. 3. Scattered punctate chronic microhemorrhages, some of which are in a peripheral distribution, nonspecific. Sequela of cerebral amyloid angiopathy can not be entirely excluded. 4. Moderate chronic white matter microangiopathy, advanced for age. 5. Mild focal stenosis of the left A2 segment. Otherwise, patent intracranial vasculature. 6. Patent vasculature of the neck. Electronically Signed   By: Valetta Mole M.D.   On: 05/31/2021 17:34   MR ANGIO NECK WO CONTRAST  Result Date: 05/31/2021 CLINICAL DATA:  Neuro deficit, stroke suspected EXAM: MRI HEAD WITHOUT CONTRAST MRA HEAD WITHOUT CONTRAST MRA NECK WITHOUT CONTRAST TECHNIQUE: Multiplanar, multiecho pulse sequences of the brain and surrounding structures were obtained without intravenous contrast. Angiographic images of the Circle of Willis were obtained using MRA technique without intravenous contrast. Angiographic images of the neck were obtained using MRA technique without intravenous contrast. Carotid stenosis measurements (when applicable) are obtained utilizing NASCET criteria, using the distal internal carotid diameter as the denominator. COMPARISON:  Same-day noncontrast CT head FINDINGS: MRI HEAD FINDINGS Brain: There is no acute intracranial hemorrhage, extra-axial fluid collection, or acute infarct. Parenchymal volume is normal. The ventricles are normal in size. There is patchy and confluent FLAIR signal abnormality throughout the subcortical and periventricular white matter likely reflecting sequela of moderate chronic white matter microangiopathy, advanced for age. There are small remote lacunar infarcts in the  bilateral basal ganglia and left caudate head. Additional small remote infarct is seen in the right cerebral peduncle extending to the pons. There are scattered punctate chronic microhemorrhages including in the left caudate head, right lentiform nucleus, and right parietal and occipital lobes. There is no solid mass lesion.  There is no midline shift. Vascular: Normal flow voids. Skull and upper cervical spine: Normal marrow signal. Sinuses/Orbits: There is mild mucosal thickening in the paranasal sinuses. There is T2 hyperintensity in the left optic nerve (10-24, 10-27). Other: None. MRA HEAD FINDINGS Anterior circulation: The intracranial ICAs are patent. The bilateral MCAs are patent There is mild focal stenosis of the left A2 segment (3-156). The ACAs are otherwise patent. The anterior communicating artery is patent. There is no aneurysm. Posterior circulation: The bilateral V4 segments are patent. The basilar artery is patent. The bilateral PCAs are patent.  The left posterior communicating artery is identified. The right posterior communicating artery is not definitely seen. There is no aneurysm. Anatomic variants: None. MRA NECK FINDINGS Aortic arch: The imaged portions of the aortic arch are grossly unremarkable, though suboptimally evaluated. The origins of the major branch vessels are patent. Right carotid system: Right common, internal, and external carotid arteries are patent, without hemodynamically significant stenosis or occlusion. There is no evidence of dissection or aneurysm. Left carotid system: The left common, internal, and external carotid arteries are patent, without hemodynamically significant stenosis or occlusion. There is no evidence of dissection or aneurysm. Vertebral arteries: Vertebral arteries are patent with antegrade flow. There is no evidence of hemodynamically significant stenosis or occlusion. There is no evidence of dissection or aneurysm. Other: None IMPRESSION: 1. No acute  intracranial pathology. 2. Signal abnormality in the left optic nerve may reflect sequela of prior optic neuritis in the absence of acute symptoms. 3. Scattered punctate chronic microhemorrhages, some of which are in a peripheral distribution, nonspecific. Sequela of cerebral amyloid angiopathy can not be entirely excluded. 4. Moderate chronic white matter microangiopathy, advanced for age. 5. Mild focal stenosis of the left A2 segment. Otherwise, patent intracranial vasculature. 6. Patent vasculature of the neck. Electronically Signed   By: Valetta Mole M.D.   On: 05/31/2021 17:34   MR BRAIN WO CONTRAST  Result Date: 05/31/2021 CLINICAL DATA:  Neuro deficit, stroke suspected EXAM: MRI HEAD WITHOUT CONTRAST MRA HEAD WITHOUT CONTRAST MRA NECK WITHOUT CONTRAST TECHNIQUE: Multiplanar, multiecho pulse sequences of the brain and surrounding structures were obtained without intravenous contrast. Angiographic images of the Circle of Willis were obtained using MRA technique without intravenous contrast. Angiographic images of the neck were obtained using MRA technique without intravenous contrast. Carotid stenosis measurements (when applicable) are obtained utilizing NASCET criteria, using the distal internal carotid diameter as the denominator. COMPARISON:  Same-day noncontrast CT head FINDINGS: MRI HEAD FINDINGS Brain: There is no acute intracranial hemorrhage, extra-axial fluid collection, or acute infarct. Parenchymal volume is normal. The ventricles are normal in size. There is patchy and confluent FLAIR signal abnormality throughout the subcortical and periventricular white matter likely reflecting sequela of moderate chronic white matter microangiopathy, advanced for age. There are small remote lacunar infarcts in the bilateral basal ganglia and left caudate head. Additional small remote infarct is seen in the right cerebral peduncle extending to the pons. There are scattered punctate chronic microhemorrhages  including in the left caudate head, right lentiform nucleus, and right parietal and occipital lobes. There is no solid mass lesion.  There is no midline shift. Vascular: Normal flow voids. Skull and upper cervical spine: Normal marrow signal. Sinuses/Orbits: There is mild mucosal thickening in the paranasal sinuses. There is T2 hyperintensity in the left optic nerve (10-24, 10-27). Other: None. MRA HEAD FINDINGS Anterior circulation: The intracranial ICAs are patent. The bilateral MCAs are patent There is mild focal stenosis of the left A2 segment (3-156). The ACAs are otherwise patent. The anterior communicating artery is patent. There is no aneurysm. Posterior circulation: The bilateral V4 segments are patent. The basilar artery is patent. The bilateral PCAs are patent. The left posterior communicating artery is identified. The right posterior communicating artery is not definitely seen. There is no aneurysm. Anatomic variants: None. MRA NECK FINDINGS Aortic arch: The imaged portions of the aortic arch are grossly unremarkable, though suboptimally evaluated. The origins of the major branch vessels are patent. Right carotid system: Right common, internal, and external carotid arteries are patent, without  hemodynamically significant stenosis or occlusion. There is no evidence of dissection or aneurysm. Left carotid system: The left common, internal, and external carotid arteries are patent, without hemodynamically significant stenosis or occlusion. There is no evidence of dissection or aneurysm. Vertebral arteries: Vertebral arteries are patent with antegrade flow. There is no evidence of hemodynamically significant stenosis or occlusion. There is no evidence of dissection or aneurysm. Other: None IMPRESSION: 1. No acute intracranial pathology. 2. Signal abnormality in the left optic nerve may reflect sequela of prior optic neuritis in the absence of acute symptoms. 3. Scattered punctate chronic microhemorrhages,  some of which are in a peripheral distribution, nonspecific. Sequela of cerebral amyloid angiopathy can not be entirely excluded. 4. Moderate chronic white matter microangiopathy, advanced for age. 5. Mild focal stenosis of the left A2 segment. Otherwise, patent intracranial vasculature. 6. Patent vasculature of the neck. Electronically Signed   By: Valetta Mole M.D.   On: 05/31/2021 17:34   EEG adult  Result Date: 06/01/2021 Lora Havens, MD     06/01/2021  8:43 AM Patient Name: Keylor Rands MRN: [id number] Epilepsy Attending: Lora Havens Referring Physician/Provider: Rikki Spearing, NP Date: 05/31/2021 Duration: 23.14 mins Patient history: 65 year old male with transient alteration of awareness during hemodialysis.  EEG to evaluate for seizure. Level of alertness: Awake, drowsy AEDs during EEG study: None Technical aspects: This EEG study was done with scalp electrodes positioned according to the 10-20 International system of electrode placement. Electrical activity was acquired at a sampling rate of 500Hz  and reviewed with a high frequency filter of 70Hz  and a low frequency filter of 1Hz . EEG data were recorded continuously and digitally stored. Description: The posterior dominant rhythm consists of 9 Hz activity of moderate voltage (25-35 uV) seen predominantly in posterior head regions, symmetric and reactive to eye opening and eye closing. Drowsiness was characterized by attenuation of the posterior background rhythm. Hyperventilation and photic stimulation were not performed.   IMPRESSION: This study is within normal limits. No seizures or epileptiform discharges were seen throughout the recording. Lora Havens   CT HEAD CODE STROKE WO CONTRAST  Result Date: 05/31/2021 CLINICAL DATA:  Code stroke.  Neuro deficit, acute, stroke suspected EXAM: CT HEAD WITHOUT CONTRAST TECHNIQUE: Contiguous axial images were obtained from the base of the skull through the vertex without intravenous  contrast. COMPARISON:  CT head 02/07/2021. FINDINGS: Brain: No evidence of acute large vascular territory infarction, hemorrhage, hydrocephalus, extra-axial collection or mass lesion/mass effect. Small remote lacunar infarcts versus dilated perivascular spaces in bilateral basal ganglia. Additional patchy white matter hypodensities, nonspecific but compatible with chronic microvascular ischemic disease Vascular: No hyperdense vessel identified. Skull: Small high posterior scalp contusion.  No acute fracture. Sinuses/Orbits: Clear sinuses.  Unremarkable orbits. Other: No mastoid effusions. ASPECTS [facility] Stroke Program Early CT Score) total score (0-10 with 10 being normal): 10. IMPRESSION: 1. No evidence of acute large vascular territory infarct or acute hemorrhage. ASPECTS is 10. 2. Small high posterior scalp contusion. Code stroke imaging results were communicated on 05/31/2021 at 3:36 pm to provider Dr. Leonel Ramsay via secure text paging. Electronically Signed   By: Margaretha Sheffield M.D.   On: 05/31/2021 15:37    Physical Exam: Vitals:   06/01/21 0524 06/01/21 0625 06/01/21 0725 06/01/21 1136  BP:  (!) 193/77 (!) 195/84 (!) 142/45  Pulse:  72 74 79  Resp:   20 20  Temp: 98.3 F (36.8 C) 98.6 F (37 C) 98.6 F (37 C) 98.8 F (37.1 C)  TempSrc: Oral Oral Oral Oral  SpO2:  100% 100% 100%  Weight:      Height:         General: WDWN NAD Head: NCAT sclera not icteric MMM Lungs: CTA bilaterally. No wheeze, rales or rhonchi. Breathing is unlabored. Heart: RRR. No murmur, rubs or gallops.  Abdomen: soft, nontender, +BS, no guarding, no rebound tenderness Lower extremities: no edema BLLE; discoloration noted RLE-patient reports having eczema Neuro: AAOx3. Moves all extremities spontaneously. Dialysis Access: L AVF (placed by [facility] Dr. Donzetta Matters 02/10/21); R TDC  Dialysis Orders:  MWF- [facility] (336) [phone] 3.5hrs, BFR 400, DFR 600,  EDW 71kg, 2K/ 2.5Ca Heparin NO  HEPARIN-hx AVMs Aranesp 58mcg Hectorol 1.23mcg IV qHD   Last Labs: Hgb 8.1, K 4.1, Ca 7.8, Alb 3.2  Assessment/Plan: Altered Mental Status vs CVA vs Acute Encephalopathy-Etiology unclear. Neuro following: imaging shows (-) acute pathology, EEG (-). Lab work relatively stable. Could this be HD related? Continue neuro checks and cardiac monitoring. ESRD - on HD MWF. Had 3 episodes of "passing out" 5min near ending treatment. He reports being under EDW previously. Doesn't appear overloaded on exam. Plan for HD 12/23 with gentle UF. Hypertension/volume  - Euvolemic on exam. Patient reports being hypertensive during HD treatments. Noted hypertensive at admit. Continue Amlodipine, Hydralazine, Coreg, and Imdur. Anemia of CKD - Hgb 8.1-will resume ESA Secondary Hyperparathyroidism -  Corr Ca 8.4-resume Hectorol; will check PO4 in AM Nutrition - Renal diet with fluid restriction  Tobie Poet, NP [facility] 06/01/2021, 4:15 PM    I have seen and examined this patient and agree with plan and assessment in the above note with renal recommendations/intervention highlighted. Unclear why her would have syncopal events after saline flushes to his system. No drops in BP recorded.  Concerned this may be an arrhythmia and recommend cardiology evaluation.  Possible arhythmia as etiology.  Does not make sense that saline flush would cause syncope and AMS.  Will follow with Hd tomorrow.  Governor Rooks Trayshawn Durkin,MD 06/01/2021 8:33 PM

## 2021-06-01 NOTE — Progress Notes (Signed)
OT Cancellation Note  Patient Details Name: Jeffrey Zuniga MRN: [id number] DOB: March 30, 1956   Cancelled Treatment:     Pt currently getting ready to work with PT, will check back later and eval as appropriate.  [place], OTR/L Acute Rehab Services Pager [phone] Office [phone]    Almon Register 06/01/2021, 8:03 AM

## 2021-06-01 NOTE — Progress Notes (Signed)
OT Cancellation Note and Discharge  Patient Details Name: Jeffrey Zuniga MRN: [id number] DOB: 1955-09-03   Cancelled Treatment:    Reason Eval/Treat Not Completed: OT screened, no needs identified, will sign off. 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   Jeffrey Zuniga, OTR/L Acute Rehab Services Pager [phone] Office [phone]    Jeffrey Zuniga 06/01/2021, 8:49 AM

## 2021-06-01 NOTE — ED Notes (Signed)
Attempted to call report advised that the ac said they could have 30 min

## 2021-06-01 NOTE — ED Notes (Signed)
Called floor reference purple man. Advised I would give them time

## 2021-06-01 NOTE — Evaluation (Signed)
Physical Therapy Evaluation Patient Details Name: Jeffrey Zuniga MRN: [id number] DOB: 1956-02-18 Today's Date: 06/01/2021  History of Present Illness  65 y.o. male presenting with altered mental status episode at the end of HD. PMH is significant for HTN, ESRD on HD MWF, Hepatitis C, anemia, small bowel AVM, HFpEF, T2DM diet controlled, Glaucoma.   Clinical Impression  PT eval complete. Pt is I/mod I all functional mobility including stairs. No deficits noted in strength, balance, or activity tolerance. Pt verbalizes no concerns regarding his mobility. No further skilled PT intervention indicated. PT signing off.        Recommendations for follow up therapy are one component of a multi-disciplinary discharge planning process, led by the attending physician.  Recommendations may be updated based on patient status, additional functional criteria and insurance authorization.  Follow Up Recommendations No PT follow up    Assistance Recommended at Discharge None  Functional Status Assessment Patient has not had a recent decline in their functional status  Equipment Recommendations  None recommended by PT    Recommendations for Other Services       Precautions / Restrictions Precautions Precautions: None      Mobility  Bed Mobility Overal bed mobility: Independent                  Transfers Overall transfer level: Independent Equipment used: None                    Ambulation/Gait Ambulation/Gait assistance: Independent Gait Distance (Feet): 500 Feet Assistive device: None Gait Pattern/deviations: WFL(Within Functional Limits)   Gait velocity interpretation: >4.37 ft/sec, indicative of normal walking speed      Stairs Stairs: Yes Stairs assistance: Modified independent (Device/Increase time);Supervision Stair Management: One rail Left;Forwards;Alternating pattern Number of Stairs: 12    Wheelchair Mobility    Modified Rankin (Stroke Patients Only)        Balance Overall balance assessment: Independent                                           Pertinent Vitals/Pain Pain Assessment: No/denies pain    Home Living Family/patient expects to be discharged to:: Private residence Living Arrangements: Alone Available Help at Discharge: Family;Available PRN/intermittently Type of Home: Apartment Home Access: Stairs to enter Entrance Stairs-Rails: Right;Left Entrance Stairs-Number of Steps: flight   Home Layout: One level Home Equipment: Cane - single point      Prior Function Prior Level of Function : Independent/Modified Independent             Mobility Comments: Does not drive. takes the bus to HD, family helps him get groceries       Hand Dominance        Extremity/Trunk Assessment   Upper Extremity Assessment Upper Extremity Assessment: Overall WFL for tasks assessed    Lower Extremity Assessment Lower Extremity Assessment: Overall WFL for tasks assessed    Cervical / Trunk Assessment Cervical / Trunk Assessment: Normal  Communication   Communication: No difficulties  Cognition Arousal/Alertness: Awake/alert Behavior During Therapy: WFL for tasks assessed/performed Overall Cognitive Status: Within Functional Limits for tasks assessed                                          General Comments General comments (skin  integrity, edema, etc.): increased BP    Exercises     Assessment/Plan    PT Assessment Patient does not need any further PT services  PT Problem List         PT Treatment Interventions      PT Goals (Current goals can be found in the Care Plan section)  Acute Rehab PT Goals Patient Stated Goal: home PT Goal Formulation: All assessment and education complete, DC therapy    Frequency     Barriers to discharge        Co-evaluation               AM-PAC PT "6 Clicks" Mobility  Outcome Measure Help needed turning from your back to your  side while in a flat bed without using bedrails?: None Help needed moving from lying on your back to sitting on the side of a flat bed without using bedrails?: None Help needed moving to and from a bed to a chair (including a wheelchair)?: None Help needed standing up from a chair using your arms (e.g., wheelchair or bedside chair)?: None Help needed to walk in hospital room?: None Help needed climbing 3-5 steps with a railing? : None 6 Click Score: 24    End of Session Equipment Utilized During Treatment: Gait belt Activity Tolerance: Patient tolerated treatment well Patient left: in chair;with call bell/phone within reach Nurse Communication: Mobility status PT Visit Diagnosis: Difficulty in walking, not elsewhere classified (R26.2)    Time: 1157-2620 PT Time Calculation (min) (ACUTE ONLY): 18 min   Charges:   PT Evaluation $PT Eval Low Complexity: 1 Low          Lorrin Goodell, PT  Office # [phone] Pager [phone]   Lorriane Shire 06/01/2021, 9:18 AM

## 2021-06-02 LAB — CBC
HCT: 23.2 % — ABNORMAL LOW (ref 39.0–52.0)
Hemoglobin: 7.7 g/dL — ABNORMAL LOW (ref 13.0–17.0)
MCH: 30.1 pg (ref 26.0–34.0)
MCHC: 33.2 g/dL (ref 30.0–36.0)
MCV: 90.6 fL (ref 80.0–100.0)
Platelets: 77 10*3/uL — ABNORMAL LOW (ref 150–400)
RBC: 2.56 MIL/uL — ABNORMAL LOW (ref 4.22–5.81)
RDW: 16.8 % — ABNORMAL HIGH (ref 11.5–15.5)
WBC: 5.1 10*3/uL (ref 4.0–10.5)
nRBC: 0 % (ref 0.0–0.2)

## 2021-06-02 LAB — COMPREHENSIVE METABOLIC PANEL
ALT: 12 U/L (ref 0–44)
AST: 20 U/L (ref 15–41)
Albumin: 3.1 g/dL — ABNORMAL LOW (ref 3.5–5.0)
Alkaline Phosphatase: 62 U/L (ref 38–126)
Anion gap: 7 (ref 5–15)
BUN: 32 mg/dL — ABNORMAL HIGH (ref 8–23)
CO2: 27 mmol/L (ref 22–32)
Calcium: 8.2 mg/dL — ABNORMAL LOW (ref 8.9–10.3)
Chloride: 103 mmol/L (ref 98–111)
Creatinine, Ser: 6.87 mg/dL — ABNORMAL HIGH (ref 0.61–1.24)
GFR, Estimated: 8 mL/min — ABNORMAL LOW (ref >60)
Glucose, Bld: 87 mg/dL (ref 70–99)
Potassium: 4.7 mmol/L (ref 3.5–5.1)
Sodium: 137 mmol/L (ref 135–145)
Total Bilirubin: 0.4 mg/dL (ref 0.3–1.2)
Total Protein: 6.8 g/dL (ref 6.5–8.1)

## 2021-06-02 LAB — HEPATITIS B SURFACE ANTIBODY,QUALITATIVE: Hep B S Ab: NONREACTIVE

## 2021-06-02 LAB — HEPATITIS B CORE ANTIBODY, TOTAL: Hep B Core Total Ab: NONREACTIVE

## 2021-06-02 LAB — HEPATITIS B SURFACE ANTIGEN: Hepatitis B Surface Ag: NONREACTIVE

## 2021-06-02 LAB — HCV RNA QUANT: HCV Quantitative: NOT DETECTED IU/mL (ref >50)

## 2021-06-02 MED ORDER — FERROUS SULFATE 325 (65 FE) MG PO TABS: 325.0000 mg | ORAL_TABLET | Freq: Every day | ORAL | 6 refills | Status: AC

## 2021-06-02 NOTE — Plan of Care (Signed)
  Problem: Education: Goal: Knowledge of General Education information will improve Description: Including pain rating scale, medication(s)/side effects and non-pharmacologic comfort measures Outcome: Progressing   Problem: Health Behavior/Discharge Planning: Goal: Ability to manage health-related needs will improve Outcome: Progressing   Problem: Clinical Measurements: Goal: Ability to maintain clinical measurements within normal limits will improve Outcome: Progressing   Problem: Activity: Goal: Risk for activity intolerance will decrease Outcome: Progressing   Problem: Coping: Goal: Level of anxiety will decrease Outcome: Progressing   Problem: Safety: Goal: Ability to remain free from injury will improve Outcome: Progressing   

## 2021-06-02 NOTE — Progress Notes (Signed)
Pt needing consent for hemodialysis signed. Alfonse Spruce ([facility]) informed.

## 2021-06-02 NOTE — Progress Notes (Signed)
Pt receives out-pt HD at [facility] in [facility] on MWF. Pt arrives around 10:30 for 10:50 chair time. Contacted clinic to provide update. Clinic aware pt may d/c later today if pt tolerates HD and will resume on Monday. Clinic has access to [facility] epic to obtain d/c summary at d/c.   Melven Sartorius Renal Navigator [phone]

## 2021-06-02 NOTE — Progress Notes (Signed)
Pt wheeled off unit with volunteer. All pt belongings brought down by pt's daughter. IV removed CCMD notified of DC.

## 2021-06-02 NOTE — Discharge Summary (Signed)
[facility] Discharge Summary  Patient name: Jeffrey Zuniga Medical record number: [id number] Date of birth: Oct 15, 1955 Age: 65 y.o. Gender: male Date of Admission: 05/31/2021  Date of Discharge: 06/02/2021 Admitting Physician: Jeffrey Ohara McDiarmid, MD  Primary Care Provider: Eppie Gibson, MD Consultants: Neurology, nephrology  Indication for Hospitalization: Acute encephalopathy  Discharge Diagnoses/Problem List:  Acute encephalopathy ESRD Hepatitis C History of anemia HFpEF T2DM Glaucoma  Disposition: Home  Discharge Condition: Stable, improved  Discharge Exam:  General: Well-appearing, resting in dialysis on evaluation Cardiac: Regular rate and rhythm, no murmurs appreciated Respiratory: Normal work of breathing, lungs clear to auscultation Abdomen: Soft, nontender Extremities: No gross abnormalities Neuro: No neurologic deficits appreciated cranial nerves intact.  Brief Hospital Course:  Jeffrey Zuniga is a 65 y.o. male with PMHx of  HTN, ESRD on HD MWF, Hepatitis C, anemia, small bowel AVM, HFpEF, T2DM diet controlled, glaucoma., who presented with acute encephalopathy and code stroke was called.  Acute Encephalopathy   Stroke vs syncope vs TIA  Patient presented after having altered mental status during dialysis.  On arrival code stroke was called and CT scan and MRI were obtained these both showed no evidence of acute stroke or general acute pathology.  Neurology was consulted and performed an EEG which came back within normal limits.  Patient was dialyzed without recurrence of the altered mental status.  Patient was kept on telemetry throughout HD and no abnormal rhythms captured.  Cardiology was contacted for outpatient follow-up scheduling per nephrology's request.  ESRD on HD Monday Wednesday Friday Patient dialyzed while in the hospital.  Tolerated well.  Nephrology was consulted.  Etiology of altered mental status unclear to them as well.   They did recommend cardiology evaluation which could be done outpatient if patient tolerated HD well.   Other chronic conditions were medically managed with home medications and formulary alternatives as necessary   Issues for Follow Up:  Jeffrey Zuniga follow-up for chronic hepatitis C management.  On Jeffrey Zuniga for 12 weeks starting 9/26, missed 3 dose. Outpatient cardiology follow-up to assess for possible arrhythmia. Evaluate blood pressure, increase Coreg at discharge   Significant Procedures:  12/22-EEG within normal limits 12/23-HD received inpatient    Significant Labs and Imaging:  Recent Labs  Lab 05/31/21 1520 05/31/21 1526 06/01/21 0353 06/02/21 0211  WBC 6.4  --  6.2 5.1  HGB 8.4* 9.2* 8.1* 7.7*  HCT 26.4* 27.0* 24.7* 23.2*  PLT 62*  --  72* 77*   Recent Labs  Lab 05/31/21 1520 05/31/21 1526 06/01/21 0353 06/02/21 0211  NA 136 139 134* 137  K 3.3* 3.3* 4.1 4.7  CL 100 101 100 103  CO2 25  --  26 27  GLUCOSE 146* 145* 94 87  BUN 12 12 19  32*  CREATININE 3.58* 3.80* 4.98* 6.87*  CALCIUM 8.0*  --  7.8* 8.2*  ALKPHOS 75  --  69 62  AST 22  --  21 20  ALT 15  --  12 12  ALBUMIN 3.5  --  3.2* 3.1*    MR ANGIO HEAD WO CONTRAST  Result Date: 05/31/2021 CLINICAL DATA:  Neuro deficit, stroke suspected EXAM: MRI HEAD WITHOUT CONTRAST MRA HEAD WITHOUT CONTRAST MRA NECK WITHOUT CONTRAST TECHNIQUE: Multiplanar, multiecho pulse sequences of the brain and surrounding structures were obtained without intravenous contrast. Angiographic images of the Circle of Willis were obtained using MRA technique without intravenous contrast. Angiographic images of the neck were obtained using MRA technique without intravenous contrast. Carotid  stenosis measurements (when applicable) are obtained utilizing NASCET criteria, using the distal internal carotid diameter as the denominator. COMPARISON:  Same-day noncontrast CT head FINDINGS: MRI HEAD FINDINGS Brain: There is no acute intracranial  hemorrhage, extra-axial fluid collection, or acute infarct. Parenchymal volume is normal. The ventricles are normal in size. There is patchy and confluent FLAIR signal abnormality throughout the subcortical and periventricular white matter likely reflecting sequela of moderate chronic white matter microangiopathy, advanced for age. There are small remote lacunar infarcts in the bilateral basal ganglia and left caudate head. Additional small remote infarct is seen in the right cerebral peduncle extending to the pons. There are scattered punctate chronic microhemorrhages including in the left caudate head, right lentiform nucleus, and right parietal and occipital lobes. There is no solid mass lesion.  There is no midline shift. Vascular: Normal flow voids. Skull and upper cervical spine: Normal marrow signal. Sinuses/Orbits: There is mild mucosal thickening in the paranasal sinuses. There is T2 hyperintensity in the left optic nerve (10-24, 10-27). Other: None. MRA HEAD FINDINGS Anterior circulation: The intracranial ICAs are patent. The bilateral MCAs are patent There is mild focal stenosis of the left A2 segment (3-156). The ACAs are otherwise patent. The anterior communicating artery is patent. There is no aneurysm. Posterior circulation: The bilateral V4 segments are patent. The basilar artery is patent. The bilateral PCAs are patent. The left posterior communicating artery is identified. The right posterior communicating artery is not definitely seen. There is no aneurysm. Anatomic variants: None. MRA NECK FINDINGS Aortic arch: The imaged portions of the aortic arch are grossly unremarkable, though suboptimally evaluated. The origins of the major branch vessels are patent. Right carotid system: Right common, internal, and external carotid arteries are patent, without hemodynamically significant stenosis or occlusion. There is no evidence of dissection or aneurysm. Left carotid system: The left common, internal,  and external carotid arteries are patent, without hemodynamically significant stenosis or occlusion. There is no evidence of dissection or aneurysm. Vertebral arteries: Vertebral arteries are patent with antegrade flow. There is no evidence of hemodynamically significant stenosis or occlusion. There is no evidence of dissection or aneurysm. Other: None IMPRESSION: 1. No acute intracranial pathology. 2. Signal abnormality in the left optic nerve may reflect sequela of prior optic neuritis in the absence of acute symptoms. 3. Scattered punctate chronic microhemorrhages, some of which are in a peripheral distribution, nonspecific. Sequela of cerebral amyloid angiopathy can not be entirely excluded. 4. Moderate chronic white matter microangiopathy, advanced for age. 5. Mild focal stenosis of the left A2 segment. Otherwise, patent intracranial vasculature. 6. Patent vasculature of the neck. Electronically Signed   By: Valetta Mole M.D.   On: 05/31/2021 17:34   MR ANGIO NECK WO CONTRAST  Result Date: 05/31/2021 CLINICAL DATA:  Neuro deficit, stroke suspected EXAM: MRI HEAD WITHOUT CONTRAST MRA HEAD WITHOUT CONTRAST MRA NECK WITHOUT CONTRAST TECHNIQUE: Multiplanar, multiecho pulse sequences of the brain and surrounding structures were obtained without intravenous contrast. Angiographic images of the Circle of Willis were obtained using MRA technique without intravenous contrast. Angiographic images of the neck were obtained using MRA technique without intravenous contrast. Carotid stenosis measurements (when applicable) are obtained utilizing NASCET criteria, using the distal internal carotid diameter as the denominator. COMPARISON:  Same-day noncontrast CT head FINDINGS: MRI HEAD FINDINGS Brain: There is no acute intracranial hemorrhage, extra-axial fluid collection, or acute infarct. Parenchymal volume is normal. The ventricles are normal in size. There is patchy and confluent FLAIR signal abnormality throughout the  subcortical and periventricular white matter likely reflecting sequela of moderate chronic white matter microangiopathy, advanced for age. There are small remote lacunar infarcts in the bilateral basal ganglia and left caudate head. Additional small remote infarct is seen in the right cerebral peduncle extending to the pons. There are scattered punctate chronic microhemorrhages including in the left caudate head, right lentiform nucleus, and right parietal and occipital lobes. There is no solid mass lesion.  There is no midline shift. Vascular: Normal flow voids. Skull and upper cervical spine: Normal marrow signal. Sinuses/Orbits: There is mild mucosal thickening in the paranasal sinuses. There is T2 hyperintensity in the left optic nerve (10-24, 10-27). Other: None. MRA HEAD FINDINGS Anterior circulation: The intracranial ICAs are patent. The bilateral MCAs are patent There is mild focal stenosis of the left A2 segment (3-156). The ACAs are otherwise patent. The anterior communicating artery is patent. There is no aneurysm. Posterior circulation: The bilateral V4 segments are patent. The basilar artery is patent. The bilateral PCAs are patent. The left posterior communicating artery is identified. The right posterior communicating artery is not definitely seen. There is no aneurysm. Anatomic variants: None. MRA NECK FINDINGS Aortic arch: The imaged portions of the aortic arch are grossly unremarkable, though suboptimally evaluated. The origins of the major branch vessels are patent. Right carotid system: Right common, internal, and external carotid arteries are patent, without hemodynamically significant stenosis or occlusion. There is no evidence of dissection or aneurysm. Left carotid system: The left common, internal, and external carotid arteries are patent, without hemodynamically significant stenosis or occlusion. There is no evidence of dissection or aneurysm. Vertebral arteries: Vertebral arteries are  patent with antegrade flow. There is no evidence of hemodynamically significant stenosis or occlusion. There is no evidence of dissection or aneurysm. Other: None IMPRESSION: 1. No acute intracranial pathology. 2. Signal abnormality in the left optic nerve may reflect sequela of prior optic neuritis in the absence of acute symptoms. 3. Scattered punctate chronic microhemorrhages, some of which are in a peripheral distribution, nonspecific. Sequela of cerebral amyloid angiopathy can not be entirely excluded. 4. Moderate chronic white matter microangiopathy, advanced for age. 5. Mild focal stenosis of the left A2 segment. Otherwise, patent intracranial vasculature. 6. Patent vasculature of the neck. Electronically Signed   By: Valetta Mole M.D.   On: 05/31/2021 17:34   MR BRAIN WO CONTRAST  Result Date: 05/31/2021 CLINICAL DATA:  Neuro deficit, stroke suspected EXAM: MRI HEAD WITHOUT CONTRAST MRA HEAD WITHOUT CONTRAST MRA NECK WITHOUT CONTRAST TECHNIQUE: Multiplanar, multiecho pulse sequences of the brain and surrounding structures were obtained without intravenous contrast. Angiographic images of the Circle of Willis were obtained using MRA technique without intravenous contrast. Angiographic images of the neck were obtained using MRA technique without intravenous contrast. Carotid stenosis measurements (when applicable) are obtained utilizing NASCET criteria, using the distal internal carotid diameter as the denominator. COMPARISON:  Same-day noncontrast CT head FINDINGS: MRI HEAD FINDINGS Brain: There is no acute intracranial hemorrhage, extra-axial fluid collection, or acute infarct. Parenchymal volume is normal. The ventricles are normal in size. There is patchy and confluent FLAIR signal abnormality throughout the subcortical and periventricular white matter likely reflecting sequela of moderate chronic white matter microangiopathy, advanced for age. There are small remote lacunar infarcts in the bilateral  basal ganglia and left caudate head. Additional small remote infarct is seen in the right cerebral peduncle extending to the pons. There are scattered punctate chronic microhemorrhages including in the left caudate head, right lentiform nucleus, and  right parietal and occipital lobes. There is no solid mass lesion.  There is no midline shift. Vascular: Normal flow voids. Skull and upper cervical spine: Normal marrow signal. Sinuses/Orbits: There is mild mucosal thickening in the paranasal sinuses. There is T2 hyperintensity in the left optic nerve (10-24, 10-27). Other: None. MRA HEAD FINDINGS Anterior circulation: The intracranial ICAs are patent. The bilateral MCAs are patent There is mild focal stenosis of the left A2 segment (3-156). The ACAs are otherwise patent. The anterior communicating artery is patent. There is no aneurysm. Posterior circulation: The bilateral V4 segments are patent. The basilar artery is patent. The bilateral PCAs are patent. The left posterior communicating artery is identified. The right posterior communicating artery is not definitely seen. There is no aneurysm. Anatomic variants: None. MRA NECK FINDINGS Aortic arch: The imaged portions of the aortic arch are grossly unremarkable, though suboptimally evaluated. The origins of the major branch vessels are patent. Right carotid system: Right common, internal, and external carotid arteries are patent, without hemodynamically significant stenosis or occlusion. There is no evidence of dissection or aneurysm. Left carotid system: The left common, internal, and external carotid arteries are patent, without hemodynamically significant stenosis or occlusion. There is no evidence of dissection or aneurysm. Vertebral arteries: Vertebral arteries are patent with antegrade flow. There is no evidence of hemodynamically significant stenosis or occlusion. There is no evidence of dissection or aneurysm. Other: None IMPRESSION: 1. No acute intracranial  pathology. 2. Signal abnormality in the left optic nerve may reflect sequela of prior optic neuritis in the absence of acute symptoms. 3. Scattered punctate chronic microhemorrhages, some of which are in a peripheral distribution, nonspecific. Sequela of cerebral amyloid angiopathy can not be entirely excluded. 4. Moderate chronic white matter microangiopathy, advanced for age. 5. Mild focal stenosis of the left A2 segment. Otherwise, patent intracranial vasculature. 6. Patent vasculature of the neck. Electronically Signed   By: Valetta Mole M.D.   On: 05/31/2021 17:34   EEG adult  Result Date: 06/01/2021 Lora Havens, MD     06/01/2021  8:43 AM Patient Name: [facility] MRN: [id number] Epilepsy Attending: Lora Havens Referring Physician/Provider: Rikki Spearing, NP Date: 05/31/2021 Duration: 23.14 mins Patient history: 65 year old male with transient alteration of awareness during hemodialysis.  EEG to evaluate for seizure. Level of alertness: Awake, drowsy AEDs during EEG study: None Technical aspects: This EEG study was done with scalp electrodes positioned according to the 10-20 International system of electrode placement. Electrical activity was acquired at a sampling rate of 500Hz  and reviewed with a high frequency filter of 70Hz  and a low frequency filter of 1Hz . EEG data were recorded continuously and digitally stored. Description: The posterior dominant rhythm consists of 9 Hz activity of moderate voltage (25-35 uV) seen predominantly in posterior head regions, symmetric and reactive to eye opening and eye closing. Drowsiness was characterized by attenuation of the posterior background rhythm. Hyperventilation and photic stimulation were not performed.   IMPRESSION: This study is within normal limits. No seizures or epileptiform discharges were seen throughout the recording. Lora Havens   CT HEAD CODE STROKE WO CONTRAST  Result Date: 05/31/2021 CLINICAL DATA:  Code stroke.  Neuro  deficit, acute, stroke suspected EXAM: CT HEAD WITHOUT CONTRAST TECHNIQUE: Contiguous axial images were obtained from the base of the skull through the vertex without intravenous contrast. COMPARISON:  CT head 02/07/2021. FINDINGS: Brain: No evidence of acute large vascular territory infarction, hemorrhage, hydrocephalus, extra-axial collection or mass lesion/mass effect. Small  remote lacunar infarcts versus dilated perivascular spaces in bilateral basal ganglia. Additional patchy white matter hypodensities, nonspecific but compatible with chronic microvascular ischemic disease Vascular: No hyperdense vessel identified. Skull: Small high posterior scalp contusion.  No acute fracture. Sinuses/Orbits: Clear sinuses.  Unremarkable orbits. Other: No mastoid effusions. ASPECTS [facility] Stroke Program Early CT Score) total score (0-10 with 10 being normal): 10. IMPRESSION: 1. No evidence of acute large vascular territory infarct or acute hemorrhage. ASPECTS is 10. 2. Small high posterior scalp contusion. Code stroke imaging results were communicated on 05/31/2021 at 3:36 pm to provider Dr. Leonel Ramsay via secure text paging. Electronically Signed   By: Margaretha Sheffield M.D.   On: 05/31/2021 15:37     Results/Tests Pending at Time of Discharge: None   Discharge Medications:  Allergies as of 06/02/2021   No Known Allergies      Medication List     STOP taking these medications    labetalol 100 MG tablet Commonly known as: NORMODYNE       TAKE these medications    amLODipine 10 MG tablet Commonly known as: NORVASC Take 1 tablet (10 mg total) by mouth daily.   calcium acetate 667 MG capsule Commonly known as: PHOSLO Take 1,334 mg by mouth 3 (three) times daily.   carvedilol 12.5 MG tablet Commonly known as: COREG Take 12.5 mg by mouth every evening.   Darbepoetin Alfa 200 MCG/0.4ML Sosy injection Commonly known as: ARANESP Inject 0.4 mLs (200 mcg total) into the vein every Monday with  hemodialysis.   ferrous sulfate 325 (65 FE) MG tablet Take 1 tablet (325 mg total) by mouth daily with breakfast.   hydrALAZINE 25 MG tablet Commonly known as: APRESOLINE Take 25 mg by mouth 3 (three) times daily.   isosorbide mononitrate 60 MG 24 hr tablet Commonly known as: IMDUR Take 1 tablet (60 mg total) by mouth daily. What changed: when to take this   latanoprost 0.005 % ophthalmic solution Commonly known as: XALATAN Place 1 drop into the left eye at bedtime.   pantoprazole 40 MG tablet Commonly known as: PROTONIX Take 1 tablet (40 mg total) by mouth daily.   prednisoLONE acetate 1 % ophthalmic suspension Commonly known as: PRED FORTE Place 1 drop into the right eye 4 (four) times daily.   Sofosbuvir-Velpatasvir 400-100 MG Tabs Commonly known as: [facility] Take 1 tablet by mouth daily.        Discharge Instructions: Please refer to Patient Instructions section of EMR for full details.  Patient was counseled important signs and symptoms that should prompt return to medical care, changes in medications, dietary instructions, activity restrictions, and follow up appointments.   Follow-Up Appointments:  Follow-up [facility]. Call in 3 day(s).   Specialty: Infectious Diseases Why: To make an appointment for follow-up. Contact information: [place], [place] [id number] [place] [phone]        Jeffrey Gibson, MD. Go on 06/14/2021.   Specialty: Family Medicine Why: Your appointment is at 11:25am, please make sure to show 15 minutes early. If you are unable to make this appointment, please call to reschedule. Contact information: [place] [address] [phone]                 Gifford Shave, MD 06/02/2021, 3:07 PM PGY-3, [place]

## 2021-06-02 NOTE — Progress Notes (Signed)
FPTS Brief Progress Note  S: Cartel was seen this p.m. sleeping comfortably.  Did not awaken patient.  O: BP (!) 155/60 (BP Location: Right Arm)    Pulse 75    Temp 98.1 F (36.7 C) (Oral)    Resp 18    Ht 5\' 7"  (1.702 m)    Wt 73.2 kg    SpO2 97%    BMI 25.28 kg/m   Physical Exam Vitals and nursing note reviewed.  Constitutional:      General: He is sleeping. He is not in acute distress.    Appearance: He is not ill-appearing, toxic-appearing or diaphoretic.  Cardiovascular:     Rate and Rhythm: Normal rate.  Pulmonary:     Effort: Pulmonary effort is normal. No tachypnea, bradypnea or respiratory distress.    A/P: HTN Slowly improving BP 160s/60s.  We will continue to monitor. Amlodipine 10 mg daily Hydralazine 25 mg 3 times daily Imdur 60 mg daily Orders reviewed. Labs for AM ordered, which was adjusted as needed.   Remainder of plan per day team/daily progress note.  Merrily Brittle, DO 06/02/2021, 1:11 AM PGY-1, [facility] Night Resident  Please page [phone] with questions.

## 2021-06-02 NOTE — Progress Notes (Signed)
[facility] Progress Note    Assessment/ Plan:   Altered Mental Status vs CVA vs Acute Encephalopathy-Etiology unclear. Neuro following: imaging shows (-) acute pathology, EEG (-). Lab work relatively stable, has not had any significant hemodynamic changes recorded during HD. Continue neuro checks and cardiac monitoring. Possibly triggered by an arrhythmia-recommend cardiology eval ESRD - on HD MWF. Had 3 episodes of "passing out" 26min near ending treatment. He reports being under EDW previously. Doesn't appear overloaded on exam. HD today with gentle UF Hypertension/volume  - Euvolemic on exam. Patient reports being hypertensive during HD treatments. Noted hypertensive at admit. Continue Amlodipine, Hydralazine, Coreg, and Imdur. Anemia of CKD - Hgb 8.7- ESA resumed Secondary Hyperparathyroidism -  Corr Ca 8.4-resume Hectorol; will check phos Nutrition - Renal diet with fluid restriction Gean Quint, MD [facility]   Subjective:   No acute events. Open for HD today.    Objective:   BP (!) 179/83 (BP Location: Right Arm) Comment: RN Notified   Pulse 74    Temp 98.3 F (36.8 C) (Oral)    Resp 15    Ht 5\' 7"  (1.702 m)    Wt 72 kg    SpO2 100%    BMI 24.86 kg/m   Intake/Output Summary (Last 24 hours) at 06/02/2021 0745 Last data filed at 06/02/2021 6962 Gross per 24 hour  Intake 240 ml  Output --  Net 240 ml   Weight change: -1.2 kg  Physical Exam: Gen: nad CVS:s1s2, rrr Resp:cta bl XBM:WUXL, nt/nd Ext:no edema Neuro: awake, alert, moves all ext spontaneously Dialysis access: lue avf, rij tdc  Imaging: MR ANGIO HEAD WO CONTRAST  Result Date: 05/31/2021 CLINICAL DATA:  Neuro deficit, stroke suspected EXAM: MRI HEAD WITHOUT CONTRAST MRA HEAD WITHOUT CONTRAST MRA NECK WITHOUT CONTRAST TECHNIQUE: Multiplanar, multiecho pulse sequences of the brain and surrounding structures were obtained without intravenous contrast. Angiographic images of the  Circle of Willis were obtained using MRA technique without intravenous contrast. Angiographic images of the neck were obtained using MRA technique without intravenous contrast. Carotid stenosis measurements (when applicable) are obtained utilizing NASCET criteria, using the distal internal carotid diameter as the denominator. COMPARISON:  Same-day noncontrast CT head FINDINGS: MRI HEAD FINDINGS Brain: There is no acute intracranial hemorrhage, extra-axial fluid collection, or acute infarct. Parenchymal volume is normal. The ventricles are normal in size. There is patchy and confluent FLAIR signal abnormality throughout the subcortical and periventricular white matter likely reflecting sequela of moderate chronic white matter microangiopathy, advanced for age. There are small remote lacunar infarcts in the bilateral basal ganglia and left caudate head. Additional small remote infarct is seen in the right cerebral peduncle extending to the pons. There are scattered punctate chronic microhemorrhages including in the left caudate head, right lentiform nucleus, and right parietal and occipital lobes. There is no solid mass lesion.  There is no midline shift. Vascular: Normal flow voids. Skull and upper cervical spine: Normal marrow signal. Sinuses/Orbits: There is mild mucosal thickening in the paranasal sinuses. There is T2 hyperintensity in the left optic nerve (10-24, 10-27). Other: None. MRA HEAD FINDINGS Anterior circulation: The intracranial ICAs are patent. The bilateral MCAs are patent There is mild focal stenosis of the left A2 segment (3-156). The ACAs are otherwise patent. The anterior communicating artery is patent. There is no aneurysm. Posterior circulation: The bilateral V4 segments are patent. The basilar artery is patent. The bilateral PCAs are patent. The left posterior communicating artery is identified. The right posterior communicating  artery is not definitely seen. There is no aneurysm. Anatomic  variants: None. MRA NECK FINDINGS Aortic arch: The imaged portions of the aortic arch are grossly unremarkable, though suboptimally evaluated. The origins of the major branch vessels are patent. Right carotid system: Right common, internal, and external carotid arteries are patent, without hemodynamically significant stenosis or occlusion. There is no evidence of dissection or aneurysm. Left carotid system: The left common, internal, and external carotid arteries are patent, without hemodynamically significant stenosis or occlusion. There is no evidence of dissection or aneurysm. Vertebral arteries: Vertebral arteries are patent with antegrade flow. There is no evidence of hemodynamically significant stenosis or occlusion. There is no evidence of dissection or aneurysm. Other: None IMPRESSION: 1. No acute intracranial pathology. 2. Signal abnormality in the left optic nerve may reflect sequela of prior optic neuritis in the absence of acute symptoms. 3. Scattered punctate chronic microhemorrhages, some of which are in a peripheral distribution, nonspecific. Sequela of cerebral amyloid angiopathy can not be entirely excluded. 4. Moderate chronic white matter microangiopathy, advanced for age. 5. Mild focal stenosis of the left A2 segment. Otherwise, patent intracranial vasculature. 6. Patent vasculature of the neck. Electronically Signed   By: Valetta Mole M.D.   On: 05/31/2021 17:34   MR ANGIO NECK WO CONTRAST  Result Date: 05/31/2021 CLINICAL DATA:  Neuro deficit, stroke suspected EXAM: MRI HEAD WITHOUT CONTRAST MRA HEAD WITHOUT CONTRAST MRA NECK WITHOUT CONTRAST TECHNIQUE: Multiplanar, multiecho pulse sequences of the brain and surrounding structures were obtained without intravenous contrast. Angiographic images of the Circle of Willis were obtained using MRA technique without intravenous contrast. Angiographic images of the neck were obtained using MRA technique without intravenous contrast. Carotid  stenosis measurements (when applicable) are obtained utilizing NASCET criteria, using the distal internal carotid diameter as the denominator. COMPARISON:  Same-day noncontrast CT head FINDINGS: MRI HEAD FINDINGS Brain: There is no acute intracranial hemorrhage, extra-axial fluid collection, or acute infarct. Parenchymal volume is normal. The ventricles are normal in size. There is patchy and confluent FLAIR signal abnormality throughout the subcortical and periventricular white matter likely reflecting sequela of moderate chronic white matter microangiopathy, advanced for age. There are small remote lacunar infarcts in the bilateral basal ganglia and left caudate head. Additional small remote infarct is seen in the right cerebral peduncle extending to the pons. There are scattered punctate chronic microhemorrhages including in the left caudate head, right lentiform nucleus, and right parietal and occipital lobes. There is no solid mass lesion.  There is no midline shift. Vascular: Normal flow voids. Skull and upper cervical spine: Normal marrow signal. Sinuses/Orbits: There is mild mucosal thickening in the paranasal sinuses. There is T2 hyperintensity in the left optic nerve (10-24, 10-27). Other: None. MRA HEAD FINDINGS Anterior circulation: The intracranial ICAs are patent. The bilateral MCAs are patent There is mild focal stenosis of the left A2 segment (3-156). The ACAs are otherwise patent. The anterior communicating artery is patent. There is no aneurysm. Posterior circulation: The bilateral V4 segments are patent. The basilar artery is patent. The bilateral PCAs are patent. The left posterior communicating artery is identified. The right posterior communicating artery is not definitely seen. There is no aneurysm. Anatomic variants: None. MRA NECK FINDINGS Aortic arch: The imaged portions of the aortic arch are grossly unremarkable, though suboptimally evaluated. The origins of the major branch vessels are  patent. Right carotid system: Right common, internal, and external carotid arteries are patent, without hemodynamically significant stenosis or occlusion. There is no evidence  of dissection or aneurysm. Left carotid system: The left common, internal, and external carotid arteries are patent, without hemodynamically significant stenosis or occlusion. There is no evidence of dissection or aneurysm. Vertebral arteries: Vertebral arteries are patent with antegrade flow. There is no evidence of hemodynamically significant stenosis or occlusion. There is no evidence of dissection or aneurysm. Other: None IMPRESSION: 1. No acute intracranial pathology. 2. Signal abnormality in the left optic nerve may reflect sequela of prior optic neuritis in the absence of acute symptoms. 3. Scattered punctate chronic microhemorrhages, some of which are in a peripheral distribution, nonspecific. Sequela of cerebral amyloid angiopathy can not be entirely excluded. 4. Moderate chronic white matter microangiopathy, advanced for age. 5. Mild focal stenosis of the left A2 segment. Otherwise, patent intracranial vasculature. 6. Patent vasculature of the neck. Electronically Signed   By: Valetta Mole M.D.   On: 05/31/2021 17:34   MR BRAIN WO CONTRAST  Result Date: 05/31/2021 CLINICAL DATA:  Neuro deficit, stroke suspected EXAM: MRI HEAD WITHOUT CONTRAST MRA HEAD WITHOUT CONTRAST MRA NECK WITHOUT CONTRAST TECHNIQUE: Multiplanar, multiecho pulse sequences of the brain and surrounding structures were obtained without intravenous contrast. Angiographic images of the Circle of Willis were obtained using MRA technique without intravenous contrast. Angiographic images of the neck were obtained using MRA technique without intravenous contrast. Carotid stenosis measurements (when applicable) are obtained utilizing NASCET criteria, using the distal internal carotid diameter as the denominator. COMPARISON:  Same-day noncontrast CT head FINDINGS: MRI  HEAD FINDINGS Brain: There is no acute intracranial hemorrhage, extra-axial fluid collection, or acute infarct. Parenchymal volume is normal. The ventricles are normal in size. There is patchy and confluent FLAIR signal abnormality throughout the subcortical and periventricular white matter likely reflecting sequela of moderate chronic white matter microangiopathy, advanced for age. There are small remote lacunar infarcts in the bilateral basal ganglia and left caudate head. Additional small remote infarct is seen in the right cerebral peduncle extending to the pons. There are scattered punctate chronic microhemorrhages including in the left caudate head, right lentiform nucleus, and right parietal and occipital lobes. There is no solid mass lesion.  There is no midline shift. Vascular: Normal flow voids. Skull and upper cervical spine: Normal marrow signal. Sinuses/Orbits: There is mild mucosal thickening in the paranasal sinuses. There is T2 hyperintensity in the left optic nerve (10-24, 10-27). Other: None. MRA HEAD FINDINGS Anterior circulation: The intracranial ICAs are patent. The bilateral MCAs are patent There is mild focal stenosis of the left A2 segment (3-156). The ACAs are otherwise patent. The anterior communicating artery is patent. There is no aneurysm. Posterior circulation: The bilateral V4 segments are patent. The basilar artery is patent. The bilateral PCAs are patent. The left posterior communicating artery is identified. The right posterior communicating artery is not definitely seen. There is no aneurysm. Anatomic variants: None. MRA NECK FINDINGS Aortic arch: The imaged portions of the aortic arch are grossly unremarkable, though suboptimally evaluated. The origins of the major branch vessels are patent. Right carotid system: Right common, internal, and external carotid arteries are patent, without hemodynamically significant stenosis or occlusion. There is no evidence of dissection or  aneurysm. Left carotid system: The left common, internal, and external carotid arteries are patent, without hemodynamically significant stenosis or occlusion. There is no evidence of dissection or aneurysm. Vertebral arteries: Vertebral arteries are patent with antegrade flow. There is no evidence of hemodynamically significant stenosis or occlusion. There is no evidence of dissection or aneurysm. Other: None IMPRESSION: 1. No  acute intracranial pathology. 2. Signal abnormality in the left optic nerve may reflect sequela of prior optic neuritis in the absence of acute symptoms. 3. Scattered punctate chronic microhemorrhages, some of which are in a peripheral distribution, nonspecific. Sequela of cerebral amyloid angiopathy can not be entirely excluded. 4. Moderate chronic white matter microangiopathy, advanced for age. 5. Mild focal stenosis of the left A2 segment. Otherwise, patent intracranial vasculature. 6. Patent vasculature of the neck. Electronically Signed   By: Valetta Mole M.D.   On: 05/31/2021 17:34   EEG adult  Result Date: 06/01/2021 Lora Havens, MD     06/01/2021  8:43 AM Patient Name: Jeffrey Zuniga MRN: [id number] Epilepsy Attending: Lora Havens Referring Physician/Provider: Rikki Spearing, NP Date: 05/31/2021 Duration: 23.14 mins Patient history: 65 year old male with transient alteration of awareness during hemodialysis.  EEG to evaluate for seizure. Level of alertness: Awake, drowsy AEDs during EEG study: None Technical aspects: This EEG study was done with scalp electrodes positioned according to the 10-20 International system of electrode placement. Electrical activity was acquired at a sampling rate of 500Hz  and reviewed with a high frequency filter of 70Hz  and a low frequency filter of 1Hz . EEG data were recorded continuously and digitally stored. Description: The posterior dominant rhythm consists of 9 Hz activity of moderate voltage (25-35 uV) seen predominantly in posterior  head regions, symmetric and reactive to eye opening and eye closing. Drowsiness was characterized by attenuation of the posterior background rhythm. Hyperventilation and photic stimulation were not performed.   IMPRESSION: This study is within normal limits. No seizures or epileptiform discharges were seen throughout the recording. Lora Havens   CT HEAD CODE STROKE WO CONTRAST  Result Date: 05/31/2021 CLINICAL DATA:  Code stroke.  Neuro deficit, acute, stroke suspected EXAM: CT HEAD WITHOUT CONTRAST TECHNIQUE: Contiguous axial images were obtained from the base of the skull through the vertex without intravenous contrast. COMPARISON:  CT head 02/07/2021. FINDINGS: Brain: No evidence of acute large vascular territory infarction, hemorrhage, hydrocephalus, extra-axial collection or mass lesion/mass effect. Small remote lacunar infarcts versus dilated perivascular spaces in bilateral basal ganglia. Additional patchy white matter hypodensities, nonspecific but compatible with chronic microvascular ischemic disease Vascular: No hyperdense vessel identified. Skull: Small high posterior scalp contusion.  No acute fracture. Sinuses/Orbits: Clear sinuses.  Unremarkable orbits. Other: No mastoid effusions. ASPECTS [facility] Stroke Program Early CT Score) total score (0-10 with 10 being normal): 10. IMPRESSION: 1. No evidence of acute large vascular territory infarct or acute hemorrhage. ASPECTS is 10. 2. Small high posterior scalp contusion. Code stroke imaging results were communicated on 05/31/2021 at 3:36 pm to provider Dr. Leonel Ramsay via secure text paging. Electronically Signed   By: Margaretha Sheffield M.D.   On: 05/31/2021 15:37    Labs: BMET Recent Labs  Lab 05/31/21 1520 05/31/21 1526 06/01/21 0353 06/02/21 0211  NA 136 139 134* 137  K 3.3* 3.3* 4.1 4.7  CL 100 101 100 103  CO2 25  --  26 27  GLUCOSE 146* 145* 94 87  BUN 12 12 19  32*  CREATININE 3.58* 3.80* 4.98* 6.87*  CALCIUM 8.0*  --  7.8*  8.2*   CBC Recent Labs  Lab 05/31/21 1520 05/31/21 1526 06/01/21 0353 06/02/21 0211  WBC 6.4  --  6.2 5.1  NEUTROABS 5.2  --  4.6  --   HGB 8.4* 9.2* 8.1* 7.7*  HCT 26.4* 27.0* 24.7* 23.2*  MCV 93.6  --  92.5 90.6  PLT 62*  --  72* 77*    Medications:     amLODipine  10 mg Oral Daily   calcium acetate  1,334 mg Oral TID WC   carvedilol  12.5 mg Oral QPM   cetaphil   Topical BID   Chlorhexidine Gluconate Cloth  6 each Topical Daily   doxercalciferol  1.5 mcg Intravenous Q M,W,F-HD   ferrous sulfate  325 mg Oral Q breakfast   heparin  5,000 Units Subcutaneous Q8H   hydrALAZINE  25 mg Oral TID   isosorbide mononitrate  60 mg Oral QPM   latanoprost  1 drop Left Eye QHS   pantoprazole  40 mg Oral Daily   prednisoLONE acetate  1 drop Right Eye QID      Gean Quint, MD [facility] 06/02/2021, 7:45 AM

## 2021-06-02 NOTE — Progress Notes (Signed)
Family Medicine Teaching Service Daily Progress Note Intern Pager: [phone]  Patient name: Jeffrey Zuniga Medical record number: [id number] Date of birth: 1956/06/08 Age: 65 y.o. Gender: male  Primary Care Provider: Eppie Gibson, MD Consultants: Neurology, Nephrology  Code Status: Full  Pt Overview and Major Events to Date:  12/21: Admitted to FPTS   Assessment and Plan:  Jeffrey Zuniga is a 65 y.o. male presenting with altered mental status episode at the end of HD. PMH is significant for HTN, ESRD on HD MWF, Hepatitis C, anemia, small bowel AVM, HFpEF, T2DM diet controlled, glaucoma.  Acute Encephalopathy   Stroke vs syncope vs TIA  MRI and CT scan negative.  Neurology recommended no further neurologic testing.  We did recommend speaking to nephrology regarding third gentle treatments of dialysis that could hopefully help avoid these episodes.  Nephrology reports unclear etiology for these episodes as well.  They do recommend cardiology evaluation but if he tolerates dialysis while in the hospital they report this could be done outpatient.  PT evaluated and recommended no follow-up.  OT did not perform evaluation but spoke with physical therapy who identified no OT needs. - Continue frequent neurochecks - Continuous cardiac monitoring through dialysis [facility] cardiology regarding setting up outpatient follow-up - Patient may discharge after dialysis if stable with no abnormal heart rhythms.   HTN Elevated blood pressures over the last 24 hours.  Home medications include amlodipine 10 mg daily, Coreg 12.5, Imdur 60 mg.  Coreg was increased on 12/22 - Continue blood pressure monitoring - Continue amlodipine, Coreg, hydralazine, Imdur    ESRD on HD MWF  Patient receiving dialysis today 12/23.  Patient is persistently hypertensive throughout dialysis and appreciate nephrology's assistance and management of this. - Nephrology consulted, appreciate recommendations - Continue morning  BMPs and CBCs if patient remains in the hospital  Hepatitis C, stable  Patient currently taking Epclusa and follows with infectious disease.  Started on 9/26.  He will need to follow-up with ID after discharge because he has missed 3 doses.  FEN/GI: Renal diet with fluid restrictions PPx: Heparin  Dispo: Home possibly today pending dialysis  Subjective:   Patient currently doing well in dialysis.  Reports he is back to normal.  No concerns or complaints at this time.  Would like to go home. Objective: Temp:  [97.3 F (36.3 C)-98.6 F (37 C)] 97.3 F (36.3 C) (12/23 0910) Pulse Rate:  [65-78] 65 (12/23 1300) Resp:  [13-20] 19 (12/23 1300) BP: (155-203)/(60-122) 202/86 (12/23 1300) SpO2:  [97 %-100 %] 100 % (12/23 0910) Weight:  [71.2 kg-72 kg] 71.2 kg (12/23 0910) Physical Exam: General: Resting comfortably in bed, no acute distress, just initiated dialysis Cardiovascular: Regular rate and rhythm Respiratory: Clear to auscultation bilaterally Abdomen: Positive bowel sounds Neuro: No focal deficits, CN II through XII intact   Laboratory: Recent Labs  Lab 05/31/21 1520 05/31/21 1526 06/01/21 0353 06/02/21 0211  WBC 6.4  --  6.2 5.1  HGB 8.4* 9.2* 8.1* 7.7*  HCT 26.4* 27.0* 24.7* 23.2*  PLT 62*  --  72* 77*    Recent Labs  Lab 05/31/21 1520 05/31/21 1526 06/01/21 0353 06/02/21 0211  NA 136 139 134* 137  K 3.3* 3.3* 4.1 4.7  CL 100 101 100 103  CO2 25  --  26 27  BUN 12 12 19  32*  CREATININE 3.58* 3.80* 4.98* 6.87*  CALCIUM 8.0*  --  7.8* 8.2*  PROT 7.7  --  7.0 6.8  BILITOT 0.5  --  0.3 0.4  ALKPHOS 75  --  69 62  ALT 15  --  12 12  AST 22  --  21 20  GLUCOSE 146* 145* 94 87     Imaging/Diagnostic Tests: EEG negative   No results found.  Gifford Shave, MD 06/02/2021, 1:14 PM PGY-3, [place] Intern pager: [phone], text pages welcome

## 2021-06-02 NOTE — Discharge Instructions (Addendum)
Thank you for letting us care for you during your stay. You were admitted to the [facility] Medicine Teaching Service for altered mental status while at dialysis. During your stay, you were worked-up by neurology and did not have a any signs of stroke of seizure. You also underwent dialysis at our facility without issue. We are not entirely sure what is causing your condition, which is why we recommend that you follow-up with cardiology for further evaluation and continue with our clinic to adjust your blood pressure medications.   You have a follow-up appointment scheduled with Dr. Joelyn Oms on 06/14/2021 at 11:25am. If you are unable to make this appointment, please call to reschedule.   Please follow-up with the Infectious Disease group to check-in on your Hepatitis C.    If your symptoms worsen or return, please return to the hospital.  Please let us know if you have questions about your stay at [facility].

## 2021-06-03 LAB — HEPATITIS B SURFACE ANTIBODY, QUANTITATIVE
Hep B S AB Quant (Post): <3.1 m[IU]/mL — ABNORMAL LOW
Hep B S AB Quant (Post): <3.1 m[IU]/mL — ABNORMAL LOW

## 2021-06-06 ENCOUNTER — Telehealth: Payer: Self-pay

## 2021-06-06 NOTE — Telephone Encounter (Signed)
Received call from [place] with [facility] saying they have been unsuccessful in reaching the patient to schedule his last refill of Epclusa. Confirmed that they have the correct number on file.   She also states the PA expired 12/16 and a new one will need to be completed. Routing to pharmacy team.   P: [phone]  Beryle Flock, RN

## 2021-06-07 NOTE — Telephone Encounter (Signed)
Thank You I will look into his PA . & try reaching out to him again.

## 2021-06-10 NOTE — Progress Notes (Deleted)
Cardiology Office Note:    Date:  06/10/2021   ID:  Jeffrey Zuniga, DOB 1955-12-16, MRN [id number]  PCP:  Eppie Gibson, MD   [place] Prov[facility] Cardiologist:  Lenna Sciara, MD Referring MD: Ledora Bottcher, [place]   Chief Complaint/Reason for Referral: Evaluate for any cardiac causes of encephalopathy.  ASSESSMENT:    Altered mental status, unspecified altered mental status type    PLAN:    In order of problems listed above:  The patient has been monitored by telemetry and his ejection fraction is normal.  The only possibility of altered mental status from a cardiac etiology in this case would be stroke which has been excluded or a prolonged rhythm abnormality such as ventricular tachycardia.  I would think this would be quite unlikely.  He has chronic white matter disease more than expected for his age as well as lacunar infarcts.  He also has scattered chronic microhemorrhages on his MRI that was performed.  It is much more conceivable that during dialysis he becomes relatively hypotensive and has overall cerebral malperfusion leading to altered mental status.  However I will place a monitor for 3 days to evaluate further.  I will keep follow-up with me open-ended.          {Are you ordering a CV Procedure (e.g. stress test, cath, DCCV, TEE, etc)?   Press F2        :[id number]}   Dispo:  No follow-ups on file.     Medication Adjustments/Labs and Tests Ordered: Current medicines are reviewed at length with the patient today.  Concerns regarding medicines are outlined above.   Tests Ordered: No orders of the defined types were placed in this encounter.   Medication Changes: No orders of the defined types were placed in this encounter.   History of Present Illness:    The patient is a 65 y.o. male with the indicated medical history here for recommendations regarding an evaluation for cardiac etiologies of altered mental status.  The patient was admitted  recently due to an acute encephalopathic episode.  Apparently he was at dialysis and developed altered mental status.  A code stroke was called and CT scans and MRIs were obtained which showed no abnormalities.  The patient ended up having an EEG which was unremarkable.  Telemetric monitoring during follow-up hemodialysis sessions demonstrated no rhythm issues.  He is here for hospital follow-up regarding these issues.    Previous Medical History: Past Medical History:  Diagnosis Date   Acute on chronic heart failure ([place]) 10/28/2020   Acute on chronic kidney failure ([place])    Anemia 04/04/2020   Blood transfusion without reported diagnosis    Diabetes mellitus without complication ([place])    Gastritis and duodenitis 03/28/2021   GI bleed    H. pylori infection    Heart failure with preserved ejection fraction ([place]) 06/01/2021   transthoracic echocardiogram 02/08/21: severe concentric left ventricular hypertrophy. Left ventricular diastolic  parameters are consistent with Grade II diastolic dysfunction. Elevated left ventricular end-diastolic pressure.    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     Allergies:    Patient has no known allergies.   Social History:   Social History   Tobacco Use   Smoking status: Every Day    Packs/day: 0.25    Types: Cigarettes   Smokeless tobacco: Never   Tobacco comments:    2021  " i AM CUTTING BACK :"  Vaping Use   Vaping Use: Never used  Substance Use Topics   Alcohol use: Not Currently   Drug use: Never     Family  Hx: Family History  Problem Relation Age of Onset   Liver disease Neg Hx    Liver cancer Neg Hx      Review of Systems:   Please see the history of present illness.    All other systems reviewed and are negative.  EKGs/Labs/Other Test Reviewed:    EKG:  EKG today:***; prior EKG: Sinus rhythm with left ventricular hypertrophy  Prior CV studies: ECHO COMPLETE WO IMAGING ENHANCING AGENT 02/08/2021  Narrative ECHOCARDIOGRAM REPORT    Patient Name:   Jeffrey Zuniga Date of Exam: 02/08/2021 Medical Rec #:  [id number]    Height:       67.0 in Accession #:    [id number]   Weight:       183.9 lb Date of Birth:  1955-12-14    BSA:          1.951 m Patient Age:    89 years     BP:           145/73 mmHg Patient Gender: M            HR:           68 bpm. Exam Location:  Inpatient  Procedure: 2D Echo, Color Doppler and Cardiac Doppler  Indications:    Congestive Heart Failure I50.9  History:        Patient has prior history of Echocardiogram examinations, most recent 10/29/2020. Risk Factors:Diabetes and Hypertension.  Sonographer:    Bernadene Person RDCS Referring Phys: [place]   1. Left ventricular ejection fraction, by estimation, is 55 to 60%. The left ventricle has normal function. The left ventricle has no regional wall motion abnormalities. There is severe concentric left ventricular hypertrophy. Left ventricular diastolic parameters are consistent with Grade II diastolic dysfunction (pseudonormalization). Elevated left ventricular end-diastolic pressure. 2. Right ventricular systolic function is normal. The right ventricular size is normal. There is severely elevated pulmonary artery systolic pressure. The estimated right ventricular systolic pressure is 94.8 mmHg. 3. Left atrial size was moderately dilated. 4. Right atrial size was moderately dilated. 5. A small pericardial effusion is present. The pericardial effusion is circumferential. There is no  evidence of cardiac tamponade. 6. The mitral valve is grossly normal. Mild mitral valve regurgitation. No evidence of mitral stenosis. 7. Tricuspid valve regurgitation is mild to moderate. 8. The aortic valve is grossly normal. Aortic valve regurgitation is trivial. Mild aortic valve sclerosis is present, with no evidence of aortic valve stenosis. 9. The inferior vena cava is dilated in size with <50% respiratory variability, suggesting right atrial pressure of 15 mmHg.  Comparison(s): Prior images reviewed side by side.  Conclusion(s)/Recommendation(s): Severe concentric LVH, myocardium appearance concerning for infiltrative cardiomyopathy. Consider workup including PYP scan.  FINDINGS Left Ventricle: Left ventricular ejection fraction, by estimation, is 55 to 60%. The left ventricle has normal function. The left ventricle has no regional wall motion abnormalities. The left ventricular internal cavity size was normal  in size. There is severe concentric left ventricular hypertrophy. Left ventricular diastolic parameters are consistent with Grade II diastolic dysfunction (pseudonormalization). Elevated left ventricular end-diastolic pressure.  Right Ventricle: The right ventricular size is normal. No increase in right ventricular wall thickness. Right ventricular systolic function is normal. There is severely elevated pulmonary artery systolic pressure. The tricuspid regurgitant velocity is 3.37 m/s, and with an assumed right atrial pressure of 15 mmHg, the estimated right ventricular systolic pressure is 50.5 mmHg.  Left Atrium: Left atrial size was moderately dilated.  Right Atrium: Right atrial size was moderately dilated.  Pericardium: A small pericardial effusion is present. The pericardial effusion is circumferential. There is no evidence of cardiac tamponade.  Mitral Valve: The mitral valve is grossly normal. There is mild thickening of the mitral valve leaflet(s). Mild mitral valve  regurgitation. No evidence of mitral valve stenosis.  Tricuspid Valve: The tricuspid valve is normal in structure. Tricuspid valve regurgitation is mild to moderate. No evidence of tricuspid stenosis.  Aortic Valve: The aortic valve is grossly normal. Aortic valve regurgitation is trivial. Mild aortic valve sclerosis is present, with no evidence of aortic valve stenosis.  Pulmonic Valve: The pulmonic valve was grossly normal. Pulmonic valve regurgitation is mild. No evidence of pulmonic stenosis.  Aorta: The aortic root, ascending aorta, aortic arch and descending aorta are all structurally normal, with no evidence of dilitation or obstruction.  Venous: The inferior vena cava is dilated in size with less than 50% respiratory variability, suggesting right atrial pressure of 15 mmHg.  IAS/Shunts: The atrial septum is grossly normal.   LEFT VENTRICLE PLAX 2D LVIDd:         5.30 cm      Diastology LVIDs:         3.60 cm      LV e' medial:    4.11 cm/s LV PW:         1.40 cm      LV E/e' medial:  29.4 LV IVS:        1.10 cm      LV e' lateral:   5.35 cm/s LVOT diam:     2.00 cm      LV E/e' lateral: 22.6 LV SV:         79 LV SV Index:   40 LVOT Area:     3.14 cm  LV Volumes (MOD) LV vol d, MOD A2C: 168.0 ml LV vol d, MOD A4C: 174.0 ml LV vol s, MOD A2C: 90.5 ml LV vol s, MOD A4C: 82.9 ml LV SV MOD A2C:     77.5 ml LV SV MOD A4C:     174.0 ml LV SV MOD BP:      84.0 ml  RIGHT VENTRICLE RV S prime:     14.10 cm/s TAPSE (M-mode): 2.3 cm  LEFT ATRIUM             Index       RIGHT ATRIUM           Index LA diam:        4.10 cm 2.10 cm/m  RA Area:     20.30 cm LA Vol (A2C):   61.5 ml 31.52 ml/m RA Volume:   58.80 ml  30.13 ml/m LA Vol (A4C):   57.6 ml 29.52 ml/m LA Biplane Vol: 64.0 ml 32.80 ml/m AORTIC VALVE LVOT Vmax:   124.00 cm/s LVOT Vmean:  79.600 cm/s LVOT VTI:    0.251 m  AORTA Ao Root diam:  3.00 cm Ao Asc diam:  3.40 cm  MITRAL VALVE                TRICUSPID  VALVE MV Area (PHT): 3.89 cm     TR Peak grad:   45.4 mmHg MV Decel Time: 195 msec     TR Vmax:        337.00 cm/s MV E velocity: 121.00 cm/s MV A velocity: 77.70 cm/s   SHUNTS MV E/A ratio:  1.56         Systemic VTI:  0.25 m Systemic Diam: 2.00 cm  Buford Dresser MD Electronically signed by Buford Dresser MD Signature Date/Time: 02/08/2021/11:20:09 AM    Final     Imaging studies that I have independently reviewed today: ***  Recent Labs: 02/07/2021: B Natriuretic Peptide 2,851.9; Magnesium 1.9 06/01/2021: TSH 1.975 06/02/2021: ALT 12; BUN 32; Creatinine, Ser 6.87; Hemoglobin 7.7; Platelets 77; Potassium 4.7; Sodium 137   Recent Lipid Panel Lab Results  Component Value Date/Time   CHOL 165 05/31/2021 06:39 PM   TRIG 68 05/31/2021 06:39 PM   HDL 46 05/31/2021 06:39 PM   LDLCALC 105 (H) 05/31/2021 06:39 PM    Risk Assessment/Calculations:    {Does this patient have ATRIAL FIBRILLATION?[phone]}      Physical Exam:    VS:  There were no vitals taken for this visit.   Wt Readings from Last 3 Encounters:  06/02/21 155 lb 13.8 oz (70.7 kg)  03/28/21 161 lb 6.4 oz (73.2 kg)  03/06/21 156 lb 12 oz (71.1 kg)    GENERAL:  No apparent distress, AOx3 HEENT:  No carotid bruits, +2 carotid impulses, no scleral icterus CAR: RRR Irregular RR*** no murmurs***, gallops, rubs, or thrills RES:  Clear to auscultation bilaterally ABD:  Soft, nontender, nondistended, positive bowel sounds x 4 VASC:  +2 radial pulses, +2 carotid pulses, palpable pedal pulses NEURO:  CN 2-12 grossly intact; motor and sensory grossly intact PSYCH:  No active depression or anxiety EXT:  No edema, ecchymosis, or cyanosis  Signed, Early Osmond, MD  06/10/2021 8:54 AM    [address], [place], [place]  [address] Phone: [phone]; Fax: [phone]   Note:  This document was prepared using Dragon voice recognition software and may include  unintentional dictation errors.

## 2021-06-13 ENCOUNTER — Ambulatory Visit: Payer: 59 | Admitting: Internal Medicine

## 2021-06-13 DIAGNOSIS — R4182 Altered mental status, unspecified: Secondary | ICD-10-CM

## 2021-06-14 ENCOUNTER — Inpatient Hospital Stay: Payer: 59 | Admitting: Student

## 2021-06-14 NOTE — Progress Notes (Deleted)
° ° °  SUBJECTIVE:   CHIEF COMPLAINT / HPI:   Hospital F/u Pt comes in today for a hosptial f/u after hospitalization for syncope/acute encephalopathy after dialysis. Is following with Cardiology for possible arrhytmia. He had his first outpt appt with them yesterday. ***   Hypertension Pt noted be to persistently hypertensive during hospitalization. Discharge recommendation to consider increasing his Coreg dose from 12.5mg  BID to 25mg  BID. Was on labetalol 200mg  BID prior to hospitalization.    PERTINENT  PMH / PSH: ***  OBJECTIVE:   There were no vitals taken for this visit.  ***  ASSESSMENT/PLAN:   No problem-specific Assessment & Plan notes found for this encounter.     Pearla Dubonnet, MD [place]

## 2021-06-20 ENCOUNTER — Encounter (INDEPENDENT_AMBULATORY_CARE_PROVIDER_SITE_OTHER): Payer: Self-pay | Admitting: Ophthalmology

## 2021-10-23 ENCOUNTER — Encounter (INDEPENDENT_AMBULATORY_CARE_PROVIDER_SITE_OTHER): Payer: Self-pay

## 2021-11-14 ENCOUNTER — Encounter: Payer: Self-pay | Admitting: *Deleted

## 2021-11-29 ENCOUNTER — Encounter: Payer: Self-pay | Admitting: Infectious Diseases

## 2022-02-14 ENCOUNTER — Other Ambulatory Visit: Payer: Self-pay | Admitting: Student

## 2022-02-14 DIAGNOSIS — I10 Essential (primary) hypertension: Secondary | ICD-10-CM

## 2022-04-01 ENCOUNTER — Other Ambulatory Visit: Payer: Self-pay | Admitting: Student

## 2022-04-01 DIAGNOSIS — I10 Essential (primary) hypertension: Secondary | ICD-10-CM

## 2022-04-26 ENCOUNTER — Other Ambulatory Visit: Payer: Self-pay | Admitting: Student

## 2022-04-26 DIAGNOSIS — I10 Essential (primary) hypertension: Secondary | ICD-10-CM

## 2022-06-05 ENCOUNTER — Other Ambulatory Visit: Payer: Self-pay | Admitting: Student

## 2022-06-05 DIAGNOSIS — I10 Essential (primary) hypertension: Secondary | ICD-10-CM

## 2022-06-11 DIAGNOSIS — I10 Essential (primary) hypertension: Secondary | ICD-10-CM | POA: Diagnosis not present

## 2022-06-11 DIAGNOSIS — N186 End stage renal disease: Secondary | ICD-10-CM | POA: Diagnosis not present

## 2022-06-11 DIAGNOSIS — D631 Anemia in chronic kidney disease: Secondary | ICD-10-CM | POA: Diagnosis not present

## 2022-06-13 DIAGNOSIS — D509 Iron deficiency anemia, unspecified: Secondary | ICD-10-CM | POA: Diagnosis not present

## 2022-06-13 DIAGNOSIS — N2581 Secondary hyperparathyroidism of renal origin: Secondary | ICD-10-CM | POA: Diagnosis not present

## 2022-06-13 DIAGNOSIS — N186 End stage renal disease: Secondary | ICD-10-CM | POA: Diagnosis not present

## 2022-06-13 DIAGNOSIS — D631 Anemia in chronic kidney disease: Secondary | ICD-10-CM | POA: Diagnosis not present

## 2022-06-13 DIAGNOSIS — E119 Type 2 diabetes mellitus without complications: Secondary | ICD-10-CM | POA: Diagnosis not present

## 2022-06-15 DIAGNOSIS — D509 Iron deficiency anemia, unspecified: Secondary | ICD-10-CM | POA: Diagnosis not present

## 2022-06-15 DIAGNOSIS — N186 End stage renal disease: Secondary | ICD-10-CM | POA: Diagnosis not present

## 2022-06-15 DIAGNOSIS — D631 Anemia in chronic kidney disease: Secondary | ICD-10-CM | POA: Diagnosis not present

## 2022-06-15 DIAGNOSIS — N2581 Secondary hyperparathyroidism of renal origin: Secondary | ICD-10-CM | POA: Diagnosis not present

## 2022-06-18 DIAGNOSIS — N2581 Secondary hyperparathyroidism of renal origin: Secondary | ICD-10-CM | POA: Diagnosis not present

## 2022-06-18 DIAGNOSIS — N186 End stage renal disease: Secondary | ICD-10-CM | POA: Diagnosis not present

## 2022-06-18 DIAGNOSIS — D631 Anemia in chronic kidney disease: Secondary | ICD-10-CM | POA: Diagnosis not present

## 2022-06-18 DIAGNOSIS — D509 Iron deficiency anemia, unspecified: Secondary | ICD-10-CM | POA: Diagnosis not present

## 2022-06-22 DIAGNOSIS — D631 Anemia in chronic kidney disease: Secondary | ICD-10-CM | POA: Diagnosis not present

## 2022-06-22 DIAGNOSIS — N186 End stage renal disease: Secondary | ICD-10-CM | POA: Diagnosis not present

## 2022-06-22 DIAGNOSIS — N2581 Secondary hyperparathyroidism of renal origin: Secondary | ICD-10-CM | POA: Diagnosis not present

## 2022-06-22 DIAGNOSIS — D509 Iron deficiency anemia, unspecified: Secondary | ICD-10-CM | POA: Diagnosis not present

## 2022-06-25 DIAGNOSIS — N186 End stage renal disease: Secondary | ICD-10-CM | POA: Diagnosis not present

## 2022-06-25 DIAGNOSIS — D509 Iron deficiency anemia, unspecified: Secondary | ICD-10-CM | POA: Diagnosis not present

## 2022-06-25 DIAGNOSIS — D631 Anemia in chronic kidney disease: Secondary | ICD-10-CM | POA: Diagnosis not present

## 2022-06-25 DIAGNOSIS — N2581 Secondary hyperparathyroidism of renal origin: Secondary | ICD-10-CM | POA: Diagnosis not present

## 2022-06-27 DIAGNOSIS — D509 Iron deficiency anemia, unspecified: Secondary | ICD-10-CM | POA: Diagnosis not present

## 2022-06-27 DIAGNOSIS — D631 Anemia in chronic kidney disease: Secondary | ICD-10-CM | POA: Diagnosis not present

## 2022-06-27 DIAGNOSIS — N186 End stage renal disease: Secondary | ICD-10-CM | POA: Diagnosis not present

## 2022-06-27 DIAGNOSIS — N2581 Secondary hyperparathyroidism of renal origin: Secondary | ICD-10-CM | POA: Diagnosis not present

## 2022-06-29 DIAGNOSIS — N186 End stage renal disease: Secondary | ICD-10-CM | POA: Diagnosis not present

## 2022-06-29 DIAGNOSIS — D509 Iron deficiency anemia, unspecified: Secondary | ICD-10-CM | POA: Diagnosis not present

## 2022-06-29 DIAGNOSIS — N2581 Secondary hyperparathyroidism of renal origin: Secondary | ICD-10-CM | POA: Diagnosis not present

## 2022-06-29 DIAGNOSIS — D631 Anemia in chronic kidney disease: Secondary | ICD-10-CM | POA: Diagnosis not present

## 2022-07-02 DIAGNOSIS — D509 Iron deficiency anemia, unspecified: Secondary | ICD-10-CM | POA: Diagnosis not present

## 2022-07-02 DIAGNOSIS — N186 End stage renal disease: Secondary | ICD-10-CM | POA: Diagnosis not present

## 2022-07-02 DIAGNOSIS — D631 Anemia in chronic kidney disease: Secondary | ICD-10-CM | POA: Diagnosis not present

## 2022-07-02 DIAGNOSIS — N2581 Secondary hyperparathyroidism of renal origin: Secondary | ICD-10-CM | POA: Diagnosis not present

## 2022-07-04 ENCOUNTER — Other Ambulatory Visit: Payer: Self-pay | Admitting: Student

## 2022-07-04 DIAGNOSIS — I10 Essential (primary) hypertension: Secondary | ICD-10-CM

## 2022-07-04 DIAGNOSIS — N2581 Secondary hyperparathyroidism of renal origin: Secondary | ICD-10-CM | POA: Diagnosis not present

## 2022-07-04 DIAGNOSIS — D631 Anemia in chronic kidney disease: Secondary | ICD-10-CM | POA: Diagnosis not present

## 2022-07-04 DIAGNOSIS — D509 Iron deficiency anemia, unspecified: Secondary | ICD-10-CM | POA: Diagnosis not present

## 2022-07-04 DIAGNOSIS — N186 End stage renal disease: Secondary | ICD-10-CM | POA: Diagnosis not present

## 2022-07-06 DIAGNOSIS — N186 End stage renal disease: Secondary | ICD-10-CM | POA: Diagnosis not present

## 2022-07-06 DIAGNOSIS — D631 Anemia in chronic kidney disease: Secondary | ICD-10-CM | POA: Diagnosis not present

## 2022-07-06 DIAGNOSIS — N2581 Secondary hyperparathyroidism of renal origin: Secondary | ICD-10-CM | POA: Diagnosis not present

## 2022-07-06 DIAGNOSIS — D509 Iron deficiency anemia, unspecified: Secondary | ICD-10-CM | POA: Diagnosis not present

## 2022-07-09 DIAGNOSIS — D509 Iron deficiency anemia, unspecified: Secondary | ICD-10-CM | POA: Diagnosis not present

## 2022-07-09 DIAGNOSIS — N2581 Secondary hyperparathyroidism of renal origin: Secondary | ICD-10-CM | POA: Diagnosis not present

## 2022-07-09 DIAGNOSIS — N186 End stage renal disease: Secondary | ICD-10-CM | POA: Diagnosis not present

## 2022-07-09 DIAGNOSIS — D631 Anemia in chronic kidney disease: Secondary | ICD-10-CM | POA: Diagnosis not present

## 2022-07-11 DIAGNOSIS — D509 Iron deficiency anemia, unspecified: Secondary | ICD-10-CM | POA: Diagnosis not present

## 2022-07-11 DIAGNOSIS — N186 End stage renal disease: Secondary | ICD-10-CM | POA: Diagnosis not present

## 2022-07-11 DIAGNOSIS — D631 Anemia in chronic kidney disease: Secondary | ICD-10-CM | POA: Diagnosis not present

## 2022-07-11 DIAGNOSIS — N2581 Secondary hyperparathyroidism of renal origin: Secondary | ICD-10-CM | POA: Diagnosis not present

## 2022-07-12 DIAGNOSIS — I10 Essential (primary) hypertension: Secondary | ICD-10-CM | POA: Diagnosis not present

## 2022-07-12 DIAGNOSIS — D631 Anemia in chronic kidney disease: Secondary | ICD-10-CM | POA: Diagnosis not present

## 2022-07-12 DIAGNOSIS — N186 End stage renal disease: Secondary | ICD-10-CM | POA: Diagnosis not present

## 2022-07-13 DIAGNOSIS — N186 End stage renal disease: Secondary | ICD-10-CM | POA: Diagnosis not present

## 2022-07-13 DIAGNOSIS — Z23 Encounter for immunization: Secondary | ICD-10-CM | POA: Diagnosis not present

## 2022-07-13 DIAGNOSIS — D509 Iron deficiency anemia, unspecified: Secondary | ICD-10-CM | POA: Diagnosis not present

## 2022-07-13 DIAGNOSIS — N2581 Secondary hyperparathyroidism of renal origin: Secondary | ICD-10-CM | POA: Diagnosis not present

## 2022-07-13 DIAGNOSIS — D631 Anemia in chronic kidney disease: Secondary | ICD-10-CM | POA: Diagnosis not present

## 2022-07-16 DIAGNOSIS — N186 End stage renal disease: Secondary | ICD-10-CM | POA: Diagnosis not present

## 2022-07-16 DIAGNOSIS — Z23 Encounter for immunization: Secondary | ICD-10-CM | POA: Diagnosis not present

## 2022-07-16 DIAGNOSIS — N2581 Secondary hyperparathyroidism of renal origin: Secondary | ICD-10-CM | POA: Diagnosis not present

## 2022-07-16 DIAGNOSIS — D631 Anemia in chronic kidney disease: Secondary | ICD-10-CM | POA: Diagnosis not present

## 2022-07-16 DIAGNOSIS — D509 Iron deficiency anemia, unspecified: Secondary | ICD-10-CM | POA: Diagnosis not present

## 2022-07-18 DIAGNOSIS — D631 Anemia in chronic kidney disease: Secondary | ICD-10-CM | POA: Diagnosis not present

## 2022-07-18 DIAGNOSIS — E119 Type 2 diabetes mellitus without complications: Secondary | ICD-10-CM | POA: Diagnosis not present

## 2022-07-18 DIAGNOSIS — N2581 Secondary hyperparathyroidism of renal origin: Secondary | ICD-10-CM | POA: Diagnosis not present

## 2022-07-18 DIAGNOSIS — Z23 Encounter for immunization: Secondary | ICD-10-CM | POA: Diagnosis not present

## 2022-07-18 DIAGNOSIS — N186 End stage renal disease: Secondary | ICD-10-CM | POA: Diagnosis not present

## 2022-07-18 DIAGNOSIS — Z1159 Encounter for screening for other viral diseases: Secondary | ICD-10-CM | POA: Diagnosis not present

## 2022-07-18 DIAGNOSIS — Z114 Encounter for screening for human immunodeficiency virus [HIV]: Secondary | ICD-10-CM | POA: Diagnosis not present

## 2022-07-18 DIAGNOSIS — D509 Iron deficiency anemia, unspecified: Secondary | ICD-10-CM | POA: Diagnosis not present

## 2022-07-20 DIAGNOSIS — D631 Anemia in chronic kidney disease: Secondary | ICD-10-CM | POA: Diagnosis not present

## 2022-07-20 DIAGNOSIS — N186 End stage renal disease: Secondary | ICD-10-CM | POA: Diagnosis not present

## 2022-07-20 DIAGNOSIS — Z23 Encounter for immunization: Secondary | ICD-10-CM | POA: Diagnosis not present

## 2022-07-20 DIAGNOSIS — D509 Iron deficiency anemia, unspecified: Secondary | ICD-10-CM | POA: Diagnosis not present

## 2022-07-20 DIAGNOSIS — N2581 Secondary hyperparathyroidism of renal origin: Secondary | ICD-10-CM | POA: Diagnosis not present

## 2022-07-23 DIAGNOSIS — N2581 Secondary hyperparathyroidism of renal origin: Secondary | ICD-10-CM | POA: Diagnosis not present

## 2022-07-23 DIAGNOSIS — D509 Iron deficiency anemia, unspecified: Secondary | ICD-10-CM | POA: Diagnosis not present

## 2022-07-23 DIAGNOSIS — N186 End stage renal disease: Secondary | ICD-10-CM | POA: Diagnosis not present

## 2022-07-23 DIAGNOSIS — D631 Anemia in chronic kidney disease: Secondary | ICD-10-CM | POA: Diagnosis not present

## 2022-07-23 DIAGNOSIS — Z23 Encounter for immunization: Secondary | ICD-10-CM | POA: Diagnosis not present

## 2022-07-25 DIAGNOSIS — N186 End stage renal disease: Secondary | ICD-10-CM | POA: Diagnosis not present

## 2022-07-25 DIAGNOSIS — N2581 Secondary hyperparathyroidism of renal origin: Secondary | ICD-10-CM | POA: Diagnosis not present

## 2022-07-25 DIAGNOSIS — Z23 Encounter for immunization: Secondary | ICD-10-CM | POA: Diagnosis not present

## 2022-07-25 DIAGNOSIS — D631 Anemia in chronic kidney disease: Secondary | ICD-10-CM | POA: Diagnosis not present

## 2022-07-25 DIAGNOSIS — D509 Iron deficiency anemia, unspecified: Secondary | ICD-10-CM | POA: Diagnosis not present

## 2022-07-27 DIAGNOSIS — D509 Iron deficiency anemia, unspecified: Secondary | ICD-10-CM | POA: Diagnosis not present

## 2022-07-27 DIAGNOSIS — D631 Anemia in chronic kidney disease: Secondary | ICD-10-CM | POA: Diagnosis not present

## 2022-07-27 DIAGNOSIS — N2581 Secondary hyperparathyroidism of renal origin: Secondary | ICD-10-CM | POA: Diagnosis not present

## 2022-07-27 DIAGNOSIS — Z23 Encounter for immunization: Secondary | ICD-10-CM | POA: Diagnosis not present

## 2022-07-27 DIAGNOSIS — N186 End stage renal disease: Secondary | ICD-10-CM | POA: Diagnosis not present

## 2022-07-30 DIAGNOSIS — N186 End stage renal disease: Secondary | ICD-10-CM | POA: Diagnosis not present

## 2022-07-30 DIAGNOSIS — D631 Anemia in chronic kidney disease: Secondary | ICD-10-CM | POA: Diagnosis not present

## 2022-07-30 DIAGNOSIS — N2581 Secondary hyperparathyroidism of renal origin: Secondary | ICD-10-CM | POA: Diagnosis not present

## 2022-07-30 DIAGNOSIS — Z23 Encounter for immunization: Secondary | ICD-10-CM | POA: Diagnosis not present

## 2022-07-30 DIAGNOSIS — D509 Iron deficiency anemia, unspecified: Secondary | ICD-10-CM | POA: Diagnosis not present

## 2022-08-01 DIAGNOSIS — Z23 Encounter for immunization: Secondary | ICD-10-CM | POA: Diagnosis not present

## 2022-08-01 DIAGNOSIS — D509 Iron deficiency anemia, unspecified: Secondary | ICD-10-CM | POA: Diagnosis not present

## 2022-08-01 DIAGNOSIS — N2581 Secondary hyperparathyroidism of renal origin: Secondary | ICD-10-CM | POA: Diagnosis not present

## 2022-08-01 DIAGNOSIS — D631 Anemia in chronic kidney disease: Secondary | ICD-10-CM | POA: Diagnosis not present

## 2022-08-01 DIAGNOSIS — N186 End stage renal disease: Secondary | ICD-10-CM | POA: Diagnosis not present

## 2022-08-03 DIAGNOSIS — D631 Anemia in chronic kidney disease: Secondary | ICD-10-CM | POA: Diagnosis not present

## 2022-08-03 DIAGNOSIS — N186 End stage renal disease: Secondary | ICD-10-CM | POA: Diagnosis not present

## 2022-08-03 DIAGNOSIS — D509 Iron deficiency anemia, unspecified: Secondary | ICD-10-CM | POA: Diagnosis not present

## 2022-08-03 DIAGNOSIS — Z23 Encounter for immunization: Secondary | ICD-10-CM | POA: Diagnosis not present

## 2022-08-03 DIAGNOSIS — N2581 Secondary hyperparathyroidism of renal origin: Secondary | ICD-10-CM | POA: Diagnosis not present

## 2022-08-06 DIAGNOSIS — D631 Anemia in chronic kidney disease: Secondary | ICD-10-CM | POA: Diagnosis not present

## 2022-08-06 DIAGNOSIS — N186 End stage renal disease: Secondary | ICD-10-CM | POA: Diagnosis not present

## 2022-08-06 DIAGNOSIS — D509 Iron deficiency anemia, unspecified: Secondary | ICD-10-CM | POA: Diagnosis not present

## 2022-08-06 DIAGNOSIS — Z23 Encounter for immunization: Secondary | ICD-10-CM | POA: Diagnosis not present

## 2022-08-06 DIAGNOSIS — N2581 Secondary hyperparathyroidism of renal origin: Secondary | ICD-10-CM | POA: Diagnosis not present

## 2022-08-10 ENCOUNTER — Other Ambulatory Visit: Payer: Self-pay | Admitting: Student

## 2022-08-10 DIAGNOSIS — N2581 Secondary hyperparathyroidism of renal origin: Secondary | ICD-10-CM | POA: Diagnosis not present

## 2022-08-10 DIAGNOSIS — D631 Anemia in chronic kidney disease: Secondary | ICD-10-CM | POA: Diagnosis not present

## 2022-08-10 DIAGNOSIS — N186 End stage renal disease: Secondary | ICD-10-CM | POA: Diagnosis not present

## 2022-08-10 DIAGNOSIS — I10 Essential (primary) hypertension: Secondary | ICD-10-CM

## 2022-08-10 DIAGNOSIS — D509 Iron deficiency anemia, unspecified: Secondary | ICD-10-CM | POA: Diagnosis not present

## 2022-08-13 DIAGNOSIS — N186 End stage renal disease: Secondary | ICD-10-CM | POA: Diagnosis not present

## 2022-08-13 DIAGNOSIS — D509 Iron deficiency anemia, unspecified: Secondary | ICD-10-CM | POA: Diagnosis not present

## 2022-08-13 DIAGNOSIS — N2581 Secondary hyperparathyroidism of renal origin: Secondary | ICD-10-CM | POA: Diagnosis not present

## 2022-08-13 DIAGNOSIS — D631 Anemia in chronic kidney disease: Secondary | ICD-10-CM | POA: Diagnosis not present

## 2022-08-14 DIAGNOSIS — E113593 Type 2 diabetes mellitus with proliferative diabetic retinopathy without macular edema, bilateral: Secondary | ICD-10-CM | POA: Diagnosis not present

## 2022-08-14 DIAGNOSIS — Z961 Presence of intraocular lens: Secondary | ICD-10-CM | POA: Diagnosis not present

## 2022-08-14 DIAGNOSIS — H401133 Primary open-angle glaucoma, bilateral, severe stage: Secondary | ICD-10-CM | POA: Diagnosis not present

## 2022-08-14 DIAGNOSIS — H25812 Combined forms of age-related cataract, left eye: Secondary | ICD-10-CM | POA: Diagnosis not present

## 2022-08-14 DIAGNOSIS — H31091 Other chorioretinal scars, right eye: Secondary | ICD-10-CM | POA: Diagnosis not present

## 2022-08-15 DIAGNOSIS — N2581 Secondary hyperparathyroidism of renal origin: Secondary | ICD-10-CM | POA: Diagnosis not present

## 2022-08-15 DIAGNOSIS — D509 Iron deficiency anemia, unspecified: Secondary | ICD-10-CM | POA: Diagnosis not present

## 2022-08-15 DIAGNOSIS — E119 Type 2 diabetes mellitus without complications: Secondary | ICD-10-CM | POA: Diagnosis not present

## 2022-08-15 DIAGNOSIS — N186 End stage renal disease: Secondary | ICD-10-CM | POA: Diagnosis not present

## 2022-08-15 DIAGNOSIS — D631 Anemia in chronic kidney disease: Secondary | ICD-10-CM | POA: Diagnosis not present

## 2022-08-17 DIAGNOSIS — N2581 Secondary hyperparathyroidism of renal origin: Secondary | ICD-10-CM | POA: Diagnosis not present

## 2022-08-17 DIAGNOSIS — N186 End stage renal disease: Secondary | ICD-10-CM | POA: Diagnosis not present

## 2022-08-17 DIAGNOSIS — D631 Anemia in chronic kidney disease: Secondary | ICD-10-CM | POA: Diagnosis not present

## 2022-08-17 DIAGNOSIS — D509 Iron deficiency anemia, unspecified: Secondary | ICD-10-CM | POA: Diagnosis not present

## 2022-08-20 DIAGNOSIS — D509 Iron deficiency anemia, unspecified: Secondary | ICD-10-CM | POA: Diagnosis not present

## 2022-08-20 DIAGNOSIS — N186 End stage renal disease: Secondary | ICD-10-CM | POA: Diagnosis not present

## 2022-08-20 DIAGNOSIS — N2581 Secondary hyperparathyroidism of renal origin: Secondary | ICD-10-CM | POA: Diagnosis not present

## 2022-08-20 DIAGNOSIS — D631 Anemia in chronic kidney disease: Secondary | ICD-10-CM | POA: Diagnosis not present

## 2022-08-22 DIAGNOSIS — N186 End stage renal disease: Secondary | ICD-10-CM | POA: Diagnosis not present

## 2022-08-22 DIAGNOSIS — D631 Anemia in chronic kidney disease: Secondary | ICD-10-CM | POA: Diagnosis not present

## 2022-08-22 DIAGNOSIS — D509 Iron deficiency anemia, unspecified: Secondary | ICD-10-CM | POA: Diagnosis not present

## 2022-08-22 DIAGNOSIS — N2581 Secondary hyperparathyroidism of renal origin: Secondary | ICD-10-CM | POA: Diagnosis not present

## 2022-08-24 ENCOUNTER — Other Ambulatory Visit: Payer: Self-pay | Admitting: Student

## 2022-08-24 DIAGNOSIS — I10 Essential (primary) hypertension: Secondary | ICD-10-CM

## 2022-08-24 DIAGNOSIS — D631 Anemia in chronic kidney disease: Secondary | ICD-10-CM | POA: Diagnosis not present

## 2022-08-24 DIAGNOSIS — D509 Iron deficiency anemia, unspecified: Secondary | ICD-10-CM | POA: Diagnosis not present

## 2022-08-24 DIAGNOSIS — N186 End stage renal disease: Secondary | ICD-10-CM | POA: Diagnosis not present

## 2022-08-24 DIAGNOSIS — N2581 Secondary hyperparathyroidism of renal origin: Secondary | ICD-10-CM | POA: Diagnosis not present

## 2022-08-27 DIAGNOSIS — N186 End stage renal disease: Secondary | ICD-10-CM | POA: Diagnosis not present

## 2022-08-27 DIAGNOSIS — D631 Anemia in chronic kidney disease: Secondary | ICD-10-CM | POA: Diagnosis not present

## 2022-08-27 DIAGNOSIS — D509 Iron deficiency anemia, unspecified: Secondary | ICD-10-CM | POA: Diagnosis not present

## 2022-08-27 DIAGNOSIS — N2581 Secondary hyperparathyroidism of renal origin: Secondary | ICD-10-CM | POA: Diagnosis not present

## 2022-08-28 DIAGNOSIS — E113492 Type 2 diabetes mellitus with severe nonproliferative diabetic retinopathy without macular edema, left eye: Secondary | ICD-10-CM | POA: Diagnosis not present

## 2022-08-28 DIAGNOSIS — E113511 Type 2 diabetes mellitus with proliferative diabetic retinopathy with macular edema, right eye: Secondary | ICD-10-CM | POA: Diagnosis not present

## 2022-08-28 DIAGNOSIS — H401133 Primary open-angle glaucoma, bilateral, severe stage: Secondary | ICD-10-CM | POA: Diagnosis not present

## 2022-08-28 DIAGNOSIS — H35371 Puckering of macula, right eye: Secondary | ICD-10-CM | POA: Diagnosis not present

## 2022-08-31 DIAGNOSIS — N186 End stage renal disease: Secondary | ICD-10-CM | POA: Diagnosis not present

## 2022-08-31 DIAGNOSIS — D509 Iron deficiency anemia, unspecified: Secondary | ICD-10-CM | POA: Diagnosis not present

## 2022-08-31 DIAGNOSIS — D631 Anemia in chronic kidney disease: Secondary | ICD-10-CM | POA: Diagnosis not present

## 2022-08-31 DIAGNOSIS — N2581 Secondary hyperparathyroidism of renal origin: Secondary | ICD-10-CM | POA: Diagnosis not present

## 2022-09-03 DIAGNOSIS — N2581 Secondary hyperparathyroidism of renal origin: Secondary | ICD-10-CM | POA: Diagnosis not present

## 2022-09-03 DIAGNOSIS — N186 End stage renal disease: Secondary | ICD-10-CM | POA: Diagnosis not present

## 2022-09-03 DIAGNOSIS — D509 Iron deficiency anemia, unspecified: Secondary | ICD-10-CM | POA: Diagnosis not present

## 2022-09-03 DIAGNOSIS — D631 Anemia in chronic kidney disease: Secondary | ICD-10-CM | POA: Diagnosis not present

## 2022-09-04 DIAGNOSIS — E113511 Type 2 diabetes mellitus with proliferative diabetic retinopathy with macular edema, right eye: Secondary | ICD-10-CM | POA: Diagnosis not present

## 2022-09-04 DIAGNOSIS — E113492 Type 2 diabetes mellitus with severe nonproliferative diabetic retinopathy without macular edema, left eye: Secondary | ICD-10-CM | POA: Diagnosis not present

## 2022-09-05 DIAGNOSIS — D631 Anemia in chronic kidney disease: Secondary | ICD-10-CM | POA: Diagnosis not present

## 2022-09-05 DIAGNOSIS — N186 End stage renal disease: Secondary | ICD-10-CM | POA: Diagnosis not present

## 2022-09-05 DIAGNOSIS — D509 Iron deficiency anemia, unspecified: Secondary | ICD-10-CM | POA: Diagnosis not present

## 2022-09-05 DIAGNOSIS — N2581 Secondary hyperparathyroidism of renal origin: Secondary | ICD-10-CM | POA: Diagnosis not present

## 2022-09-07 DIAGNOSIS — N186 End stage renal disease: Secondary | ICD-10-CM | POA: Diagnosis not present

## 2022-09-07 DIAGNOSIS — N2581 Secondary hyperparathyroidism of renal origin: Secondary | ICD-10-CM | POA: Diagnosis not present

## 2022-09-07 DIAGNOSIS — D509 Iron deficiency anemia, unspecified: Secondary | ICD-10-CM | POA: Diagnosis not present

## 2022-09-07 DIAGNOSIS — D631 Anemia in chronic kidney disease: Secondary | ICD-10-CM | POA: Diagnosis not present

## 2022-09-10 DIAGNOSIS — N2581 Secondary hyperparathyroidism of renal origin: Secondary | ICD-10-CM | POA: Diagnosis not present

## 2022-09-10 DIAGNOSIS — D631 Anemia in chronic kidney disease: Secondary | ICD-10-CM | POA: Diagnosis not present

## 2022-09-10 DIAGNOSIS — N186 End stage renal disease: Secondary | ICD-10-CM | POA: Diagnosis not present

## 2022-09-10 DIAGNOSIS — D509 Iron deficiency anemia, unspecified: Secondary | ICD-10-CM | POA: Diagnosis not present

## 2022-09-10 DIAGNOSIS — E8779 Other fluid overload: Secondary | ICD-10-CM | POA: Diagnosis not present

## 2022-09-10 DIAGNOSIS — I10 Essential (primary) hypertension: Secondary | ICD-10-CM | POA: Diagnosis not present

## 2022-09-12 DIAGNOSIS — E8779 Other fluid overload: Secondary | ICD-10-CM | POA: Diagnosis not present

## 2022-09-12 DIAGNOSIS — N186 End stage renal disease: Secondary | ICD-10-CM | POA: Diagnosis not present

## 2022-09-12 DIAGNOSIS — D631 Anemia in chronic kidney disease: Secondary | ICD-10-CM | POA: Diagnosis not present

## 2022-09-12 DIAGNOSIS — D509 Iron deficiency anemia, unspecified: Secondary | ICD-10-CM | POA: Diagnosis not present

## 2022-09-12 DIAGNOSIS — E119 Type 2 diabetes mellitus without complications: Secondary | ICD-10-CM | POA: Diagnosis not present

## 2022-09-12 DIAGNOSIS — N2581 Secondary hyperparathyroidism of renal origin: Secondary | ICD-10-CM | POA: Diagnosis not present

## 2022-09-14 DIAGNOSIS — D509 Iron deficiency anemia, unspecified: Secondary | ICD-10-CM | POA: Diagnosis not present

## 2022-09-14 DIAGNOSIS — E8779 Other fluid overload: Secondary | ICD-10-CM | POA: Diagnosis not present

## 2022-09-14 DIAGNOSIS — N2581 Secondary hyperparathyroidism of renal origin: Secondary | ICD-10-CM | POA: Diagnosis not present

## 2022-09-14 DIAGNOSIS — N186 End stage renal disease: Secondary | ICD-10-CM | POA: Diagnosis not present

## 2022-09-14 DIAGNOSIS — D631 Anemia in chronic kidney disease: Secondary | ICD-10-CM | POA: Diagnosis not present

## 2022-09-17 DIAGNOSIS — D631 Anemia in chronic kidney disease: Secondary | ICD-10-CM | POA: Diagnosis not present

## 2022-09-17 DIAGNOSIS — N2581 Secondary hyperparathyroidism of renal origin: Secondary | ICD-10-CM | POA: Diagnosis not present

## 2022-09-17 DIAGNOSIS — E8779 Other fluid overload: Secondary | ICD-10-CM | POA: Diagnosis not present

## 2022-09-17 DIAGNOSIS — N186 End stage renal disease: Secondary | ICD-10-CM | POA: Diagnosis not present

## 2022-09-17 DIAGNOSIS — D509 Iron deficiency anemia, unspecified: Secondary | ICD-10-CM | POA: Diagnosis not present

## 2022-09-18 DIAGNOSIS — E113511 Type 2 diabetes mellitus with proliferative diabetic retinopathy with macular edema, right eye: Secondary | ICD-10-CM | POA: Diagnosis not present

## 2022-09-19 DIAGNOSIS — D631 Anemia in chronic kidney disease: Secondary | ICD-10-CM | POA: Diagnosis not present

## 2022-09-19 DIAGNOSIS — D509 Iron deficiency anemia, unspecified: Secondary | ICD-10-CM | POA: Diagnosis not present

## 2022-09-19 DIAGNOSIS — N2581 Secondary hyperparathyroidism of renal origin: Secondary | ICD-10-CM | POA: Diagnosis not present

## 2022-09-19 DIAGNOSIS — E8779 Other fluid overload: Secondary | ICD-10-CM | POA: Diagnosis not present

## 2022-09-19 DIAGNOSIS — N186 End stage renal disease: Secondary | ICD-10-CM | POA: Diagnosis not present

## 2022-09-21 DIAGNOSIS — N2581 Secondary hyperparathyroidism of renal origin: Secondary | ICD-10-CM | POA: Diagnosis not present

## 2022-09-21 DIAGNOSIS — D509 Iron deficiency anemia, unspecified: Secondary | ICD-10-CM | POA: Diagnosis not present

## 2022-09-21 DIAGNOSIS — E8779 Other fluid overload: Secondary | ICD-10-CM | POA: Diagnosis not present

## 2022-09-21 DIAGNOSIS — D631 Anemia in chronic kidney disease: Secondary | ICD-10-CM | POA: Diagnosis not present

## 2022-09-21 DIAGNOSIS — N186 End stage renal disease: Secondary | ICD-10-CM | POA: Diagnosis not present

## 2022-09-24 DIAGNOSIS — N186 End stage renal disease: Secondary | ICD-10-CM | POA: Diagnosis not present

## 2022-09-24 DIAGNOSIS — E8779 Other fluid overload: Secondary | ICD-10-CM | POA: Diagnosis not present

## 2022-09-24 DIAGNOSIS — N2581 Secondary hyperparathyroidism of renal origin: Secondary | ICD-10-CM | POA: Diagnosis not present

## 2022-09-24 DIAGNOSIS — D509 Iron deficiency anemia, unspecified: Secondary | ICD-10-CM | POA: Diagnosis not present

## 2022-09-24 DIAGNOSIS — D631 Anemia in chronic kidney disease: Secondary | ICD-10-CM | POA: Diagnosis not present

## 2022-09-26 DIAGNOSIS — N186 End stage renal disease: Secondary | ICD-10-CM | POA: Diagnosis not present

## 2022-09-26 DIAGNOSIS — E8779 Other fluid overload: Secondary | ICD-10-CM | POA: Diagnosis not present

## 2022-09-26 DIAGNOSIS — D509 Iron deficiency anemia, unspecified: Secondary | ICD-10-CM | POA: Diagnosis not present

## 2022-09-26 DIAGNOSIS — D631 Anemia in chronic kidney disease: Secondary | ICD-10-CM | POA: Diagnosis not present

## 2022-09-26 DIAGNOSIS — N2581 Secondary hyperparathyroidism of renal origin: Secondary | ICD-10-CM | POA: Diagnosis not present

## 2022-09-28 DIAGNOSIS — D509 Iron deficiency anemia, unspecified: Secondary | ICD-10-CM | POA: Diagnosis not present

## 2022-09-28 DIAGNOSIS — D631 Anemia in chronic kidney disease: Secondary | ICD-10-CM | POA: Diagnosis not present

## 2022-09-28 DIAGNOSIS — N186 End stage renal disease: Secondary | ICD-10-CM | POA: Diagnosis not present

## 2022-09-28 DIAGNOSIS — N2581 Secondary hyperparathyroidism of renal origin: Secondary | ICD-10-CM | POA: Diagnosis not present

## 2022-09-28 DIAGNOSIS — E8779 Other fluid overload: Secondary | ICD-10-CM | POA: Diagnosis not present

## 2022-09-29 DIAGNOSIS — N186 End stage renal disease: Secondary | ICD-10-CM | POA: Diagnosis not present

## 2022-09-29 DIAGNOSIS — D509 Iron deficiency anemia, unspecified: Secondary | ICD-10-CM | POA: Diagnosis not present

## 2022-09-29 DIAGNOSIS — D631 Anemia in chronic kidney disease: Secondary | ICD-10-CM | POA: Diagnosis not present

## 2022-09-29 DIAGNOSIS — E8779 Other fluid overload: Secondary | ICD-10-CM | POA: Diagnosis not present

## 2022-09-29 DIAGNOSIS — N2581 Secondary hyperparathyroidism of renal origin: Secondary | ICD-10-CM | POA: Diagnosis not present

## 2022-10-01 DIAGNOSIS — N2581 Secondary hyperparathyroidism of renal origin: Secondary | ICD-10-CM | POA: Diagnosis not present

## 2022-10-01 DIAGNOSIS — E8779 Other fluid overload: Secondary | ICD-10-CM | POA: Diagnosis not present

## 2022-10-01 DIAGNOSIS — D631 Anemia in chronic kidney disease: Secondary | ICD-10-CM | POA: Diagnosis not present

## 2022-10-01 DIAGNOSIS — D509 Iron deficiency anemia, unspecified: Secondary | ICD-10-CM | POA: Diagnosis not present

## 2022-10-01 DIAGNOSIS — N186 End stage renal disease: Secondary | ICD-10-CM | POA: Diagnosis not present

## 2022-10-03 DIAGNOSIS — N186 End stage renal disease: Secondary | ICD-10-CM | POA: Diagnosis not present

## 2022-10-03 DIAGNOSIS — D509 Iron deficiency anemia, unspecified: Secondary | ICD-10-CM | POA: Diagnosis not present

## 2022-10-03 DIAGNOSIS — N2581 Secondary hyperparathyroidism of renal origin: Secondary | ICD-10-CM | POA: Diagnosis not present

## 2022-10-03 DIAGNOSIS — D631 Anemia in chronic kidney disease: Secondary | ICD-10-CM | POA: Diagnosis not present

## 2022-10-03 DIAGNOSIS — E8779 Other fluid overload: Secondary | ICD-10-CM | POA: Diagnosis not present

## 2022-10-04 DIAGNOSIS — E113492 Type 2 diabetes mellitus with severe nonproliferative diabetic retinopathy without macular edema, left eye: Secondary | ICD-10-CM | POA: Diagnosis not present

## 2022-10-04 DIAGNOSIS — E113511 Type 2 diabetes mellitus with proliferative diabetic retinopathy with macular edema, right eye: Secondary | ICD-10-CM | POA: Diagnosis not present

## 2022-10-04 DIAGNOSIS — H35371 Puckering of macula, right eye: Secondary | ICD-10-CM | POA: Diagnosis not present

## 2022-10-04 DIAGNOSIS — H401133 Primary open-angle glaucoma, bilateral, severe stage: Secondary | ICD-10-CM | POA: Diagnosis not present

## 2022-10-05 DIAGNOSIS — D509 Iron deficiency anemia, unspecified: Secondary | ICD-10-CM | POA: Diagnosis not present

## 2022-10-05 DIAGNOSIS — N186 End stage renal disease: Secondary | ICD-10-CM | POA: Diagnosis not present

## 2022-10-05 DIAGNOSIS — E8779 Other fluid overload: Secondary | ICD-10-CM | POA: Diagnosis not present

## 2022-10-05 DIAGNOSIS — N2581 Secondary hyperparathyroidism of renal origin: Secondary | ICD-10-CM | POA: Diagnosis not present

## 2022-10-05 DIAGNOSIS — D631 Anemia in chronic kidney disease: Secondary | ICD-10-CM | POA: Diagnosis not present

## 2022-10-10 DIAGNOSIS — D509 Iron deficiency anemia, unspecified: Secondary | ICD-10-CM | POA: Diagnosis not present

## 2022-10-10 DIAGNOSIS — N186 End stage renal disease: Secondary | ICD-10-CM | POA: Diagnosis not present

## 2022-10-10 DIAGNOSIS — E119 Type 2 diabetes mellitus without complications: Secondary | ICD-10-CM | POA: Diagnosis not present

## 2022-10-10 DIAGNOSIS — I10 Essential (primary) hypertension: Secondary | ICD-10-CM | POA: Diagnosis not present

## 2022-10-10 DIAGNOSIS — N2581 Secondary hyperparathyroidism of renal origin: Secondary | ICD-10-CM | POA: Diagnosis not present

## 2022-10-10 DIAGNOSIS — D631 Anemia in chronic kidney disease: Secondary | ICD-10-CM | POA: Diagnosis not present

## 2022-10-12 DIAGNOSIS — D509 Iron deficiency anemia, unspecified: Secondary | ICD-10-CM | POA: Diagnosis not present

## 2022-10-12 DIAGNOSIS — N2581 Secondary hyperparathyroidism of renal origin: Secondary | ICD-10-CM | POA: Diagnosis not present

## 2022-10-12 DIAGNOSIS — D631 Anemia in chronic kidney disease: Secondary | ICD-10-CM | POA: Diagnosis not present

## 2022-10-12 DIAGNOSIS — N186 End stage renal disease: Secondary | ICD-10-CM | POA: Diagnosis not present

## 2022-10-15 DIAGNOSIS — D631 Anemia in chronic kidney disease: Secondary | ICD-10-CM | POA: Diagnosis not present

## 2022-10-15 DIAGNOSIS — D509 Iron deficiency anemia, unspecified: Secondary | ICD-10-CM | POA: Diagnosis not present

## 2022-10-15 DIAGNOSIS — N186 End stage renal disease: Secondary | ICD-10-CM | POA: Diagnosis not present

## 2022-10-15 DIAGNOSIS — N2581 Secondary hyperparathyroidism of renal origin: Secondary | ICD-10-CM | POA: Diagnosis not present

## 2022-10-17 DIAGNOSIS — N186 End stage renal disease: Secondary | ICD-10-CM | POA: Diagnosis not present

## 2022-10-17 DIAGNOSIS — D509 Iron deficiency anemia, unspecified: Secondary | ICD-10-CM | POA: Diagnosis not present

## 2022-10-17 DIAGNOSIS — D631 Anemia in chronic kidney disease: Secondary | ICD-10-CM | POA: Diagnosis not present

## 2022-10-17 DIAGNOSIS — N2581 Secondary hyperparathyroidism of renal origin: Secondary | ICD-10-CM | POA: Diagnosis not present

## 2022-10-18 DIAGNOSIS — E113511 Type 2 diabetes mellitus with proliferative diabetic retinopathy with macular edema, right eye: Secondary | ICD-10-CM | POA: Diagnosis not present

## 2022-10-18 DIAGNOSIS — H35371 Puckering of macula, right eye: Secondary | ICD-10-CM | POA: Diagnosis not present

## 2022-10-19 DIAGNOSIS — D509 Iron deficiency anemia, unspecified: Secondary | ICD-10-CM | POA: Diagnosis not present

## 2022-10-19 DIAGNOSIS — N186 End stage renal disease: Secondary | ICD-10-CM | POA: Diagnosis not present

## 2022-10-19 DIAGNOSIS — D631 Anemia in chronic kidney disease: Secondary | ICD-10-CM | POA: Diagnosis not present

## 2022-10-19 DIAGNOSIS — N2581 Secondary hyperparathyroidism of renal origin: Secondary | ICD-10-CM | POA: Diagnosis not present

## 2022-10-22 DIAGNOSIS — D509 Iron deficiency anemia, unspecified: Secondary | ICD-10-CM | POA: Diagnosis not present

## 2022-10-22 DIAGNOSIS — D631 Anemia in chronic kidney disease: Secondary | ICD-10-CM | POA: Diagnosis not present

## 2022-10-22 DIAGNOSIS — N2581 Secondary hyperparathyroidism of renal origin: Secondary | ICD-10-CM | POA: Diagnosis not present

## 2022-10-22 DIAGNOSIS — N186 End stage renal disease: Secondary | ICD-10-CM | POA: Diagnosis not present

## 2022-10-24 DIAGNOSIS — D631 Anemia in chronic kidney disease: Secondary | ICD-10-CM | POA: Diagnosis not present

## 2022-10-24 DIAGNOSIS — N186 End stage renal disease: Secondary | ICD-10-CM | POA: Diagnosis not present

## 2022-10-24 DIAGNOSIS — N2581 Secondary hyperparathyroidism of renal origin: Secondary | ICD-10-CM | POA: Diagnosis not present

## 2022-10-24 DIAGNOSIS — D509 Iron deficiency anemia, unspecified: Secondary | ICD-10-CM | POA: Diagnosis not present

## 2022-10-26 DIAGNOSIS — N186 End stage renal disease: Secondary | ICD-10-CM | POA: Diagnosis not present

## 2022-10-26 DIAGNOSIS — D631 Anemia in chronic kidney disease: Secondary | ICD-10-CM | POA: Diagnosis not present

## 2022-10-26 DIAGNOSIS — D509 Iron deficiency anemia, unspecified: Secondary | ICD-10-CM | POA: Diagnosis not present

## 2022-10-26 DIAGNOSIS — N2581 Secondary hyperparathyroidism of renal origin: Secondary | ICD-10-CM | POA: Diagnosis not present

## 2022-10-29 DIAGNOSIS — D631 Anemia in chronic kidney disease: Secondary | ICD-10-CM | POA: Diagnosis not present

## 2022-10-29 DIAGNOSIS — N186 End stage renal disease: Secondary | ICD-10-CM | POA: Diagnosis not present

## 2022-10-29 DIAGNOSIS — D509 Iron deficiency anemia, unspecified: Secondary | ICD-10-CM | POA: Diagnosis not present

## 2022-10-29 DIAGNOSIS — N2581 Secondary hyperparathyroidism of renal origin: Secondary | ICD-10-CM | POA: Diagnosis not present

## 2022-10-31 DIAGNOSIS — D631 Anemia in chronic kidney disease: Secondary | ICD-10-CM | POA: Diagnosis not present

## 2022-10-31 DIAGNOSIS — D509 Iron deficiency anemia, unspecified: Secondary | ICD-10-CM | POA: Diagnosis not present

## 2022-10-31 DIAGNOSIS — N2581 Secondary hyperparathyroidism of renal origin: Secondary | ICD-10-CM | POA: Diagnosis not present

## 2022-10-31 DIAGNOSIS — N186 End stage renal disease: Secondary | ICD-10-CM | POA: Diagnosis not present

## 2022-11-02 DIAGNOSIS — D631 Anemia in chronic kidney disease: Secondary | ICD-10-CM | POA: Diagnosis not present

## 2022-11-02 DIAGNOSIS — D509 Iron deficiency anemia, unspecified: Secondary | ICD-10-CM | POA: Diagnosis not present

## 2022-11-02 DIAGNOSIS — N2581 Secondary hyperparathyroidism of renal origin: Secondary | ICD-10-CM | POA: Diagnosis not present

## 2022-11-02 DIAGNOSIS — N186 End stage renal disease: Secondary | ICD-10-CM | POA: Diagnosis not present

## 2022-11-05 DIAGNOSIS — D631 Anemia in chronic kidney disease: Secondary | ICD-10-CM | POA: Diagnosis not present

## 2022-11-05 DIAGNOSIS — N186 End stage renal disease: Secondary | ICD-10-CM | POA: Diagnosis not present

## 2022-11-05 DIAGNOSIS — N2581 Secondary hyperparathyroidism of renal origin: Secondary | ICD-10-CM | POA: Diagnosis not present

## 2022-11-05 DIAGNOSIS — D509 Iron deficiency anemia, unspecified: Secondary | ICD-10-CM | POA: Diagnosis not present

## 2022-11-07 DIAGNOSIS — D631 Anemia in chronic kidney disease: Secondary | ICD-10-CM | POA: Diagnosis not present

## 2022-11-07 DIAGNOSIS — N186 End stage renal disease: Secondary | ICD-10-CM | POA: Diagnosis not present

## 2022-11-07 DIAGNOSIS — N2581 Secondary hyperparathyroidism of renal origin: Secondary | ICD-10-CM | POA: Diagnosis not present

## 2022-11-07 DIAGNOSIS — D509 Iron deficiency anemia, unspecified: Secondary | ICD-10-CM | POA: Diagnosis not present

## 2022-11-09 DIAGNOSIS — N186 End stage renal disease: Secondary | ICD-10-CM | POA: Diagnosis not present

## 2022-11-09 DIAGNOSIS — D631 Anemia in chronic kidney disease: Secondary | ICD-10-CM | POA: Diagnosis not present

## 2022-11-09 DIAGNOSIS — D509 Iron deficiency anemia, unspecified: Secondary | ICD-10-CM | POA: Diagnosis not present

## 2022-11-09 DIAGNOSIS — N2581 Secondary hyperparathyroidism of renal origin: Secondary | ICD-10-CM | POA: Diagnosis not present

## 2022-11-10 DIAGNOSIS — D631 Anemia in chronic kidney disease: Secondary | ICD-10-CM | POA: Diagnosis not present

## 2022-11-10 DIAGNOSIS — I10 Essential (primary) hypertension: Secondary | ICD-10-CM | POA: Diagnosis not present

## 2022-11-10 DIAGNOSIS — N186 End stage renal disease: Secondary | ICD-10-CM | POA: Diagnosis not present

## 2022-11-12 DIAGNOSIS — N186 End stage renal disease: Secondary | ICD-10-CM | POA: Diagnosis not present

## 2022-11-12 DIAGNOSIS — N2581 Secondary hyperparathyroidism of renal origin: Secondary | ICD-10-CM | POA: Diagnosis not present

## 2022-11-12 DIAGNOSIS — D509 Iron deficiency anemia, unspecified: Secondary | ICD-10-CM | POA: Diagnosis not present

## 2022-11-12 DIAGNOSIS — D631 Anemia in chronic kidney disease: Secondary | ICD-10-CM | POA: Diagnosis not present

## 2022-11-14 DIAGNOSIS — N2581 Secondary hyperparathyroidism of renal origin: Secondary | ICD-10-CM | POA: Diagnosis not present

## 2022-11-14 DIAGNOSIS — D509 Iron deficiency anemia, unspecified: Secondary | ICD-10-CM | POA: Diagnosis not present

## 2022-11-14 DIAGNOSIS — N186 End stage renal disease: Secondary | ICD-10-CM | POA: Diagnosis not present

## 2022-11-14 DIAGNOSIS — D631 Anemia in chronic kidney disease: Secondary | ICD-10-CM | POA: Diagnosis not present

## 2022-11-14 DIAGNOSIS — E119 Type 2 diabetes mellitus without complications: Secondary | ICD-10-CM | POA: Diagnosis not present

## 2022-11-16 DIAGNOSIS — N186 End stage renal disease: Secondary | ICD-10-CM | POA: Diagnosis not present

## 2022-11-16 DIAGNOSIS — D509 Iron deficiency anemia, unspecified: Secondary | ICD-10-CM | POA: Diagnosis not present

## 2022-11-16 DIAGNOSIS — D631 Anemia in chronic kidney disease: Secondary | ICD-10-CM | POA: Diagnosis not present

## 2022-11-16 DIAGNOSIS — N2581 Secondary hyperparathyroidism of renal origin: Secondary | ICD-10-CM | POA: Diagnosis not present

## 2022-11-19 DIAGNOSIS — N2581 Secondary hyperparathyroidism of renal origin: Secondary | ICD-10-CM | POA: Diagnosis not present

## 2022-11-19 DIAGNOSIS — D509 Iron deficiency anemia, unspecified: Secondary | ICD-10-CM | POA: Diagnosis not present

## 2022-11-19 DIAGNOSIS — D631 Anemia in chronic kidney disease: Secondary | ICD-10-CM | POA: Diagnosis not present

## 2022-11-19 DIAGNOSIS — N186 End stage renal disease: Secondary | ICD-10-CM | POA: Diagnosis not present

## 2022-11-21 DIAGNOSIS — D631 Anemia in chronic kidney disease: Secondary | ICD-10-CM | POA: Diagnosis not present

## 2022-11-21 DIAGNOSIS — N2581 Secondary hyperparathyroidism of renal origin: Secondary | ICD-10-CM | POA: Diagnosis not present

## 2022-11-21 DIAGNOSIS — D509 Iron deficiency anemia, unspecified: Secondary | ICD-10-CM | POA: Diagnosis not present

## 2022-11-21 DIAGNOSIS — N186 End stage renal disease: Secondary | ICD-10-CM | POA: Diagnosis not present

## 2022-11-23 DIAGNOSIS — D631 Anemia in chronic kidney disease: Secondary | ICD-10-CM | POA: Diagnosis not present

## 2022-11-23 DIAGNOSIS — N2581 Secondary hyperparathyroidism of renal origin: Secondary | ICD-10-CM | POA: Diagnosis not present

## 2022-11-23 DIAGNOSIS — N186 End stage renal disease: Secondary | ICD-10-CM | POA: Diagnosis not present

## 2022-11-23 DIAGNOSIS — D509 Iron deficiency anemia, unspecified: Secondary | ICD-10-CM | POA: Diagnosis not present

## 2022-11-26 DIAGNOSIS — D509 Iron deficiency anemia, unspecified: Secondary | ICD-10-CM | POA: Diagnosis not present

## 2022-11-26 DIAGNOSIS — N2581 Secondary hyperparathyroidism of renal origin: Secondary | ICD-10-CM | POA: Diagnosis not present

## 2022-11-26 DIAGNOSIS — D631 Anemia in chronic kidney disease: Secondary | ICD-10-CM | POA: Diagnosis not present

## 2022-11-26 DIAGNOSIS — N186 End stage renal disease: Secondary | ICD-10-CM | POA: Diagnosis not present

## 2022-11-28 DIAGNOSIS — N2581 Secondary hyperparathyroidism of renal origin: Secondary | ICD-10-CM | POA: Diagnosis not present

## 2022-11-28 DIAGNOSIS — D631 Anemia in chronic kidney disease: Secondary | ICD-10-CM | POA: Diagnosis not present

## 2022-11-28 DIAGNOSIS — N186 End stage renal disease: Secondary | ICD-10-CM | POA: Diagnosis not present

## 2022-11-28 DIAGNOSIS — D509 Iron deficiency anemia, unspecified: Secondary | ICD-10-CM | POA: Diagnosis not present

## 2022-11-29 DIAGNOSIS — E113511 Type 2 diabetes mellitus with proliferative diabetic retinopathy with macular edema, right eye: Secondary | ICD-10-CM | POA: Diagnosis not present

## 2022-11-29 DIAGNOSIS — H35371 Puckering of macula, right eye: Secondary | ICD-10-CM | POA: Diagnosis not present

## 2022-11-29 DIAGNOSIS — E113492 Type 2 diabetes mellitus with severe nonproliferative diabetic retinopathy without macular edema, left eye: Secondary | ICD-10-CM | POA: Diagnosis not present

## 2022-11-29 DIAGNOSIS — H401133 Primary open-angle glaucoma, bilateral, severe stage: Secondary | ICD-10-CM | POA: Diagnosis not present

## 2022-11-30 DIAGNOSIS — N2581 Secondary hyperparathyroidism of renal origin: Secondary | ICD-10-CM | POA: Diagnosis not present

## 2022-11-30 DIAGNOSIS — D509 Iron deficiency anemia, unspecified: Secondary | ICD-10-CM | POA: Diagnosis not present

## 2022-11-30 DIAGNOSIS — D631 Anemia in chronic kidney disease: Secondary | ICD-10-CM | POA: Diagnosis not present

## 2022-11-30 DIAGNOSIS — N186 End stage renal disease: Secondary | ICD-10-CM | POA: Diagnosis not present

## 2022-12-03 DIAGNOSIS — D509 Iron deficiency anemia, unspecified: Secondary | ICD-10-CM | POA: Diagnosis not present

## 2022-12-03 DIAGNOSIS — N186 End stage renal disease: Secondary | ICD-10-CM | POA: Diagnosis not present

## 2022-12-03 DIAGNOSIS — N2581 Secondary hyperparathyroidism of renal origin: Secondary | ICD-10-CM | POA: Diagnosis not present

## 2022-12-03 DIAGNOSIS — D631 Anemia in chronic kidney disease: Secondary | ICD-10-CM | POA: Diagnosis not present

## 2022-12-05 DIAGNOSIS — N2581 Secondary hyperparathyroidism of renal origin: Secondary | ICD-10-CM | POA: Diagnosis not present

## 2022-12-05 DIAGNOSIS — N186 End stage renal disease: Secondary | ICD-10-CM | POA: Diagnosis not present

## 2022-12-05 DIAGNOSIS — D631 Anemia in chronic kidney disease: Secondary | ICD-10-CM | POA: Diagnosis not present

## 2022-12-05 DIAGNOSIS — D509 Iron deficiency anemia, unspecified: Secondary | ICD-10-CM | POA: Diagnosis not present

## 2022-12-07 DIAGNOSIS — D631 Anemia in chronic kidney disease: Secondary | ICD-10-CM | POA: Diagnosis not present

## 2022-12-07 DIAGNOSIS — N186 End stage renal disease: Secondary | ICD-10-CM | POA: Diagnosis not present

## 2022-12-07 DIAGNOSIS — N2581 Secondary hyperparathyroidism of renal origin: Secondary | ICD-10-CM | POA: Diagnosis not present

## 2022-12-07 DIAGNOSIS — D509 Iron deficiency anemia, unspecified: Secondary | ICD-10-CM | POA: Diagnosis not present

## 2022-12-10 DIAGNOSIS — N2581 Secondary hyperparathyroidism of renal origin: Secondary | ICD-10-CM | POA: Diagnosis not present

## 2022-12-10 DIAGNOSIS — D631 Anemia in chronic kidney disease: Secondary | ICD-10-CM | POA: Diagnosis not present

## 2022-12-10 DIAGNOSIS — D509 Iron deficiency anemia, unspecified: Secondary | ICD-10-CM | POA: Diagnosis not present

## 2022-12-10 DIAGNOSIS — N186 End stage renal disease: Secondary | ICD-10-CM | POA: Diagnosis not present

## 2022-12-10 DIAGNOSIS — I10 Essential (primary) hypertension: Secondary | ICD-10-CM | POA: Diagnosis not present

## 2022-12-12 DIAGNOSIS — N186 End stage renal disease: Secondary | ICD-10-CM | POA: Diagnosis not present

## 2022-12-12 DIAGNOSIS — D631 Anemia in chronic kidney disease: Secondary | ICD-10-CM | POA: Diagnosis not present

## 2022-12-12 DIAGNOSIS — N2581 Secondary hyperparathyroidism of renal origin: Secondary | ICD-10-CM | POA: Diagnosis not present

## 2022-12-12 DIAGNOSIS — D509 Iron deficiency anemia, unspecified: Secondary | ICD-10-CM | POA: Diagnosis not present

## 2022-12-14 DIAGNOSIS — D509 Iron deficiency anemia, unspecified: Secondary | ICD-10-CM | POA: Diagnosis not present

## 2022-12-14 DIAGNOSIS — D631 Anemia in chronic kidney disease: Secondary | ICD-10-CM | POA: Diagnosis not present

## 2022-12-14 DIAGNOSIS — N186 End stage renal disease: Secondary | ICD-10-CM | POA: Diagnosis not present

## 2022-12-14 DIAGNOSIS — N2581 Secondary hyperparathyroidism of renal origin: Secondary | ICD-10-CM | POA: Diagnosis not present

## 2022-12-17 DIAGNOSIS — D631 Anemia in chronic kidney disease: Secondary | ICD-10-CM | POA: Diagnosis not present

## 2022-12-17 DIAGNOSIS — D509 Iron deficiency anemia, unspecified: Secondary | ICD-10-CM | POA: Diagnosis not present

## 2022-12-17 DIAGNOSIS — N2581 Secondary hyperparathyroidism of renal origin: Secondary | ICD-10-CM | POA: Diagnosis not present

## 2022-12-17 DIAGNOSIS — N186 End stage renal disease: Secondary | ICD-10-CM | POA: Diagnosis not present

## 2022-12-19 DIAGNOSIS — N186 End stage renal disease: Secondary | ICD-10-CM | POA: Diagnosis not present

## 2022-12-19 DIAGNOSIS — D509 Iron deficiency anemia, unspecified: Secondary | ICD-10-CM | POA: Diagnosis not present

## 2022-12-19 DIAGNOSIS — N2581 Secondary hyperparathyroidism of renal origin: Secondary | ICD-10-CM | POA: Diagnosis not present

## 2022-12-19 DIAGNOSIS — D631 Anemia in chronic kidney disease: Secondary | ICD-10-CM | POA: Diagnosis not present

## 2022-12-19 DIAGNOSIS — E119 Type 2 diabetes mellitus without complications: Secondary | ICD-10-CM | POA: Diagnosis not present

## 2022-12-21 DIAGNOSIS — N186 End stage renal disease: Secondary | ICD-10-CM | POA: Diagnosis not present

## 2022-12-21 DIAGNOSIS — D509 Iron deficiency anemia, unspecified: Secondary | ICD-10-CM | POA: Diagnosis not present

## 2022-12-21 DIAGNOSIS — D631 Anemia in chronic kidney disease: Secondary | ICD-10-CM | POA: Diagnosis not present

## 2022-12-21 DIAGNOSIS — N2581 Secondary hyperparathyroidism of renal origin: Secondary | ICD-10-CM | POA: Diagnosis not present

## 2022-12-24 DIAGNOSIS — N2581 Secondary hyperparathyroidism of renal origin: Secondary | ICD-10-CM | POA: Diagnosis not present

## 2022-12-24 DIAGNOSIS — N186 End stage renal disease: Secondary | ICD-10-CM | POA: Diagnosis not present

## 2022-12-24 DIAGNOSIS — D509 Iron deficiency anemia, unspecified: Secondary | ICD-10-CM | POA: Diagnosis not present

## 2022-12-24 DIAGNOSIS — D631 Anemia in chronic kidney disease: Secondary | ICD-10-CM | POA: Diagnosis not present

## 2022-12-26 DIAGNOSIS — N2581 Secondary hyperparathyroidism of renal origin: Secondary | ICD-10-CM | POA: Diagnosis not present

## 2022-12-26 DIAGNOSIS — D509 Iron deficiency anemia, unspecified: Secondary | ICD-10-CM | POA: Diagnosis not present

## 2022-12-26 DIAGNOSIS — D631 Anemia in chronic kidney disease: Secondary | ICD-10-CM | POA: Diagnosis not present

## 2022-12-26 DIAGNOSIS — N186 End stage renal disease: Secondary | ICD-10-CM | POA: Diagnosis not present

## 2022-12-28 DIAGNOSIS — D631 Anemia in chronic kidney disease: Secondary | ICD-10-CM | POA: Diagnosis not present

## 2022-12-28 DIAGNOSIS — N2581 Secondary hyperparathyroidism of renal origin: Secondary | ICD-10-CM | POA: Diagnosis not present

## 2022-12-28 DIAGNOSIS — D509 Iron deficiency anemia, unspecified: Secondary | ICD-10-CM | POA: Diagnosis not present

## 2022-12-28 DIAGNOSIS — N186 End stage renal disease: Secondary | ICD-10-CM | POA: Diagnosis not present

## 2022-12-31 DIAGNOSIS — D631 Anemia in chronic kidney disease: Secondary | ICD-10-CM | POA: Diagnosis not present

## 2022-12-31 DIAGNOSIS — N2581 Secondary hyperparathyroidism of renal origin: Secondary | ICD-10-CM | POA: Diagnosis not present

## 2022-12-31 DIAGNOSIS — N186 End stage renal disease: Secondary | ICD-10-CM | POA: Diagnosis not present

## 2022-12-31 DIAGNOSIS — D509 Iron deficiency anemia, unspecified: Secondary | ICD-10-CM | POA: Diagnosis not present

## 2023-01-02 DIAGNOSIS — D509 Iron deficiency anemia, unspecified: Secondary | ICD-10-CM | POA: Diagnosis not present

## 2023-01-02 DIAGNOSIS — N2581 Secondary hyperparathyroidism of renal origin: Secondary | ICD-10-CM | POA: Diagnosis not present

## 2023-01-02 DIAGNOSIS — N186 End stage renal disease: Secondary | ICD-10-CM | POA: Diagnosis not present

## 2023-01-02 DIAGNOSIS — D631 Anemia in chronic kidney disease: Secondary | ICD-10-CM | POA: Diagnosis not present

## 2023-01-04 DIAGNOSIS — N186 End stage renal disease: Secondary | ICD-10-CM | POA: Diagnosis not present

## 2023-01-04 DIAGNOSIS — D631 Anemia in chronic kidney disease: Secondary | ICD-10-CM | POA: Diagnosis not present

## 2023-01-04 DIAGNOSIS — D509 Iron deficiency anemia, unspecified: Secondary | ICD-10-CM | POA: Diagnosis not present

## 2023-01-04 DIAGNOSIS — N2581 Secondary hyperparathyroidism of renal origin: Secondary | ICD-10-CM | POA: Diagnosis not present

## 2023-01-07 DIAGNOSIS — N2581 Secondary hyperparathyroidism of renal origin: Secondary | ICD-10-CM | POA: Diagnosis not present

## 2023-01-07 DIAGNOSIS — D631 Anemia in chronic kidney disease: Secondary | ICD-10-CM | POA: Diagnosis not present

## 2023-01-07 DIAGNOSIS — N186 End stage renal disease: Secondary | ICD-10-CM | POA: Diagnosis not present

## 2023-01-07 DIAGNOSIS — D509 Iron deficiency anemia, unspecified: Secondary | ICD-10-CM | POA: Diagnosis not present

## 2023-01-08 DIAGNOSIS — H25812 Combined forms of age-related cataract, left eye: Secondary | ICD-10-CM | POA: Diagnosis not present

## 2023-01-08 DIAGNOSIS — H31091 Other chorioretinal scars, right eye: Secondary | ICD-10-CM | POA: Diagnosis not present

## 2023-01-08 DIAGNOSIS — Z961 Presence of intraocular lens: Secondary | ICD-10-CM | POA: Diagnosis not present

## 2023-01-08 DIAGNOSIS — E113593 Type 2 diabetes mellitus with proliferative diabetic retinopathy without macular edema, bilateral: Secondary | ICD-10-CM | POA: Diagnosis not present

## 2023-01-08 DIAGNOSIS — H401133 Primary open-angle glaucoma, bilateral, severe stage: Secondary | ICD-10-CM | POA: Diagnosis not present

## 2023-01-09 DIAGNOSIS — N2581 Secondary hyperparathyroidism of renal origin: Secondary | ICD-10-CM | POA: Diagnosis not present

## 2023-01-09 DIAGNOSIS — D509 Iron deficiency anemia, unspecified: Secondary | ICD-10-CM | POA: Diagnosis not present

## 2023-01-09 DIAGNOSIS — D631 Anemia in chronic kidney disease: Secondary | ICD-10-CM | POA: Diagnosis not present

## 2023-01-09 DIAGNOSIS — N186 End stage renal disease: Secondary | ICD-10-CM | POA: Diagnosis not present

## 2023-01-10 DIAGNOSIS — I10 Essential (primary) hypertension: Secondary | ICD-10-CM | POA: Diagnosis not present

## 2023-01-10 DIAGNOSIS — D631 Anemia in chronic kidney disease: Secondary | ICD-10-CM | POA: Diagnosis not present

## 2023-01-10 DIAGNOSIS — N186 End stage renal disease: Secondary | ICD-10-CM | POA: Diagnosis not present

## 2023-01-11 DIAGNOSIS — N2581 Secondary hyperparathyroidism of renal origin: Secondary | ICD-10-CM | POA: Diagnosis not present

## 2023-01-11 DIAGNOSIS — D631 Anemia in chronic kidney disease: Secondary | ICD-10-CM | POA: Diagnosis not present

## 2023-01-11 DIAGNOSIS — D509 Iron deficiency anemia, unspecified: Secondary | ICD-10-CM | POA: Diagnosis not present

## 2023-01-11 DIAGNOSIS — N186 End stage renal disease: Secondary | ICD-10-CM | POA: Diagnosis not present

## 2023-01-14 DIAGNOSIS — N2581 Secondary hyperparathyroidism of renal origin: Secondary | ICD-10-CM | POA: Diagnosis not present

## 2023-01-14 DIAGNOSIS — N186 End stage renal disease: Secondary | ICD-10-CM | POA: Diagnosis not present

## 2023-01-14 DIAGNOSIS — D631 Anemia in chronic kidney disease: Secondary | ICD-10-CM | POA: Diagnosis not present

## 2023-01-14 DIAGNOSIS — D509 Iron deficiency anemia, unspecified: Secondary | ICD-10-CM | POA: Diagnosis not present

## 2023-01-16 DIAGNOSIS — E119 Type 2 diabetes mellitus without complications: Secondary | ICD-10-CM | POA: Diagnosis not present

## 2023-01-16 DIAGNOSIS — N186 End stage renal disease: Secondary | ICD-10-CM | POA: Diagnosis not present

## 2023-01-16 DIAGNOSIS — D631 Anemia in chronic kidney disease: Secondary | ICD-10-CM | POA: Diagnosis not present

## 2023-01-16 DIAGNOSIS — N2581 Secondary hyperparathyroidism of renal origin: Secondary | ICD-10-CM | POA: Diagnosis not present

## 2023-01-16 DIAGNOSIS — D509 Iron deficiency anemia, unspecified: Secondary | ICD-10-CM | POA: Diagnosis not present

## 2023-01-18 DIAGNOSIS — D631 Anemia in chronic kidney disease: Secondary | ICD-10-CM | POA: Diagnosis not present

## 2023-01-18 DIAGNOSIS — N2581 Secondary hyperparathyroidism of renal origin: Secondary | ICD-10-CM | POA: Diagnosis not present

## 2023-01-18 DIAGNOSIS — D509 Iron deficiency anemia, unspecified: Secondary | ICD-10-CM | POA: Diagnosis not present

## 2023-01-18 DIAGNOSIS — N186 End stage renal disease: Secondary | ICD-10-CM | POA: Diagnosis not present

## 2023-01-21 DIAGNOSIS — N2581 Secondary hyperparathyroidism of renal origin: Secondary | ICD-10-CM | POA: Diagnosis not present

## 2023-01-21 DIAGNOSIS — D631 Anemia in chronic kidney disease: Secondary | ICD-10-CM | POA: Diagnosis not present

## 2023-01-21 DIAGNOSIS — N186 End stage renal disease: Secondary | ICD-10-CM | POA: Diagnosis not present

## 2023-01-21 DIAGNOSIS — D509 Iron deficiency anemia, unspecified: Secondary | ICD-10-CM | POA: Diagnosis not present

## 2023-01-23 DIAGNOSIS — D631 Anemia in chronic kidney disease: Secondary | ICD-10-CM | POA: Diagnosis not present

## 2023-01-23 DIAGNOSIS — N2581 Secondary hyperparathyroidism of renal origin: Secondary | ICD-10-CM | POA: Diagnosis not present

## 2023-01-23 DIAGNOSIS — D509 Iron deficiency anemia, unspecified: Secondary | ICD-10-CM | POA: Diagnosis not present

## 2023-01-23 DIAGNOSIS — N186 End stage renal disease: Secondary | ICD-10-CM | POA: Diagnosis not present

## 2023-01-25 DIAGNOSIS — D631 Anemia in chronic kidney disease: Secondary | ICD-10-CM | POA: Diagnosis not present

## 2023-01-25 DIAGNOSIS — D509 Iron deficiency anemia, unspecified: Secondary | ICD-10-CM | POA: Diagnosis not present

## 2023-01-25 DIAGNOSIS — N186 End stage renal disease: Secondary | ICD-10-CM | POA: Diagnosis not present

## 2023-01-25 DIAGNOSIS — N2581 Secondary hyperparathyroidism of renal origin: Secondary | ICD-10-CM | POA: Diagnosis not present

## 2023-01-28 DIAGNOSIS — N186 End stage renal disease: Secondary | ICD-10-CM | POA: Diagnosis not present

## 2023-01-28 DIAGNOSIS — D631 Anemia in chronic kidney disease: Secondary | ICD-10-CM | POA: Diagnosis not present

## 2023-01-28 DIAGNOSIS — N2581 Secondary hyperparathyroidism of renal origin: Secondary | ICD-10-CM | POA: Diagnosis not present

## 2023-01-28 DIAGNOSIS — D509 Iron deficiency anemia, unspecified: Secondary | ICD-10-CM | POA: Diagnosis not present

## 2023-01-30 DIAGNOSIS — D509 Iron deficiency anemia, unspecified: Secondary | ICD-10-CM | POA: Diagnosis not present

## 2023-01-30 DIAGNOSIS — N2581 Secondary hyperparathyroidism of renal origin: Secondary | ICD-10-CM | POA: Diagnosis not present

## 2023-01-30 DIAGNOSIS — D631 Anemia in chronic kidney disease: Secondary | ICD-10-CM | POA: Diagnosis not present

## 2023-01-30 DIAGNOSIS — N186 End stage renal disease: Secondary | ICD-10-CM | POA: Diagnosis not present

## 2023-02-01 DIAGNOSIS — N2581 Secondary hyperparathyroidism of renal origin: Secondary | ICD-10-CM | POA: Diagnosis not present

## 2023-02-01 DIAGNOSIS — D631 Anemia in chronic kidney disease: Secondary | ICD-10-CM | POA: Diagnosis not present

## 2023-02-01 DIAGNOSIS — N186 End stage renal disease: Secondary | ICD-10-CM | POA: Diagnosis not present

## 2023-02-01 DIAGNOSIS — D509 Iron deficiency anemia, unspecified: Secondary | ICD-10-CM | POA: Diagnosis not present

## 2023-02-04 DIAGNOSIS — N2581 Secondary hyperparathyroidism of renal origin: Secondary | ICD-10-CM | POA: Diagnosis not present

## 2023-02-04 DIAGNOSIS — N186 End stage renal disease: Secondary | ICD-10-CM | POA: Diagnosis not present

## 2023-02-04 DIAGNOSIS — D631 Anemia in chronic kidney disease: Secondary | ICD-10-CM | POA: Diagnosis not present

## 2023-02-04 DIAGNOSIS — D509 Iron deficiency anemia, unspecified: Secondary | ICD-10-CM | POA: Diagnosis not present

## 2023-02-06 DIAGNOSIS — D631 Anemia in chronic kidney disease: Secondary | ICD-10-CM | POA: Diagnosis not present

## 2023-02-06 DIAGNOSIS — N2581 Secondary hyperparathyroidism of renal origin: Secondary | ICD-10-CM | POA: Diagnosis not present

## 2023-02-06 DIAGNOSIS — N186 End stage renal disease: Secondary | ICD-10-CM | POA: Diagnosis not present

## 2023-02-06 DIAGNOSIS — D509 Iron deficiency anemia, unspecified: Secondary | ICD-10-CM | POA: Diagnosis not present

## 2023-02-08 DIAGNOSIS — N2581 Secondary hyperparathyroidism of renal origin: Secondary | ICD-10-CM | POA: Diagnosis not present

## 2023-02-08 DIAGNOSIS — N186 End stage renal disease: Secondary | ICD-10-CM | POA: Diagnosis not present

## 2023-02-08 DIAGNOSIS — D509 Iron deficiency anemia, unspecified: Secondary | ICD-10-CM | POA: Diagnosis not present

## 2023-02-08 DIAGNOSIS — D631 Anemia in chronic kidney disease: Secondary | ICD-10-CM | POA: Diagnosis not present

## 2023-02-10 DIAGNOSIS — I10 Essential (primary) hypertension: Secondary | ICD-10-CM | POA: Diagnosis not present

## 2023-02-10 DIAGNOSIS — D631 Anemia in chronic kidney disease: Secondary | ICD-10-CM | POA: Diagnosis not present

## 2023-02-10 DIAGNOSIS — N186 End stage renal disease: Secondary | ICD-10-CM | POA: Diagnosis not present

## 2023-02-11 DIAGNOSIS — Z23 Encounter for immunization: Secondary | ICD-10-CM | POA: Diagnosis not present

## 2023-02-11 DIAGNOSIS — N186 End stage renal disease: Secondary | ICD-10-CM | POA: Diagnosis not present

## 2023-02-11 DIAGNOSIS — D631 Anemia in chronic kidney disease: Secondary | ICD-10-CM | POA: Diagnosis not present

## 2023-02-11 DIAGNOSIS — N2581 Secondary hyperparathyroidism of renal origin: Secondary | ICD-10-CM | POA: Diagnosis not present

## 2023-02-11 DIAGNOSIS — D509 Iron deficiency anemia, unspecified: Secondary | ICD-10-CM | POA: Diagnosis not present

## 2023-02-13 DIAGNOSIS — D509 Iron deficiency anemia, unspecified: Secondary | ICD-10-CM | POA: Diagnosis not present

## 2023-02-13 DIAGNOSIS — N186 End stage renal disease: Secondary | ICD-10-CM | POA: Diagnosis not present

## 2023-02-13 DIAGNOSIS — E119 Type 2 diabetes mellitus without complications: Secondary | ICD-10-CM | POA: Diagnosis not present

## 2023-02-13 DIAGNOSIS — Z23 Encounter for immunization: Secondary | ICD-10-CM | POA: Diagnosis not present

## 2023-02-13 DIAGNOSIS — N2581 Secondary hyperparathyroidism of renal origin: Secondary | ICD-10-CM | POA: Diagnosis not present

## 2023-02-13 DIAGNOSIS — D631 Anemia in chronic kidney disease: Secondary | ICD-10-CM | POA: Diagnosis not present

## 2023-02-15 DIAGNOSIS — N186 End stage renal disease: Secondary | ICD-10-CM | POA: Diagnosis not present

## 2023-02-15 DIAGNOSIS — N2581 Secondary hyperparathyroidism of renal origin: Secondary | ICD-10-CM | POA: Diagnosis not present

## 2023-02-15 DIAGNOSIS — D631 Anemia in chronic kidney disease: Secondary | ICD-10-CM | POA: Diagnosis not present

## 2023-02-15 DIAGNOSIS — D509 Iron deficiency anemia, unspecified: Secondary | ICD-10-CM | POA: Diagnosis not present

## 2023-02-15 DIAGNOSIS — Z23 Encounter for immunization: Secondary | ICD-10-CM | POA: Diagnosis not present

## 2023-02-18 DIAGNOSIS — D631 Anemia in chronic kidney disease: Secondary | ICD-10-CM | POA: Diagnosis not present

## 2023-02-18 DIAGNOSIS — Z23 Encounter for immunization: Secondary | ICD-10-CM | POA: Diagnosis not present

## 2023-02-18 DIAGNOSIS — N2581 Secondary hyperparathyroidism of renal origin: Secondary | ICD-10-CM | POA: Diagnosis not present

## 2023-02-18 DIAGNOSIS — D509 Iron deficiency anemia, unspecified: Secondary | ICD-10-CM | POA: Diagnosis not present

## 2023-02-18 DIAGNOSIS — N186 End stage renal disease: Secondary | ICD-10-CM | POA: Diagnosis not present

## 2023-02-20 DIAGNOSIS — N2581 Secondary hyperparathyroidism of renal origin: Secondary | ICD-10-CM | POA: Diagnosis not present

## 2023-02-20 DIAGNOSIS — N186 End stage renal disease: Secondary | ICD-10-CM | POA: Diagnosis not present

## 2023-02-20 DIAGNOSIS — D631 Anemia in chronic kidney disease: Secondary | ICD-10-CM | POA: Diagnosis not present

## 2023-02-20 DIAGNOSIS — Z23 Encounter for immunization: Secondary | ICD-10-CM | POA: Diagnosis not present

## 2023-02-20 DIAGNOSIS — D509 Iron deficiency anemia, unspecified: Secondary | ICD-10-CM | POA: Diagnosis not present

## 2023-02-22 DIAGNOSIS — D631 Anemia in chronic kidney disease: Secondary | ICD-10-CM | POA: Diagnosis not present

## 2023-02-22 DIAGNOSIS — Z23 Encounter for immunization: Secondary | ICD-10-CM | POA: Diagnosis not present

## 2023-02-22 DIAGNOSIS — N186 End stage renal disease: Secondary | ICD-10-CM | POA: Diagnosis not present

## 2023-02-22 DIAGNOSIS — N2581 Secondary hyperparathyroidism of renal origin: Secondary | ICD-10-CM | POA: Diagnosis not present

## 2023-02-22 DIAGNOSIS — D509 Iron deficiency anemia, unspecified: Secondary | ICD-10-CM | POA: Diagnosis not present

## 2023-02-25 DIAGNOSIS — N186 End stage renal disease: Secondary | ICD-10-CM | POA: Diagnosis not present

## 2023-02-25 DIAGNOSIS — N2581 Secondary hyperparathyroidism of renal origin: Secondary | ICD-10-CM | POA: Diagnosis not present

## 2023-02-25 DIAGNOSIS — D631 Anemia in chronic kidney disease: Secondary | ICD-10-CM | POA: Diagnosis not present

## 2023-02-25 DIAGNOSIS — Z23 Encounter for immunization: Secondary | ICD-10-CM | POA: Diagnosis not present

## 2023-02-25 DIAGNOSIS — D509 Iron deficiency anemia, unspecified: Secondary | ICD-10-CM | POA: Diagnosis not present

## 2023-02-27 DIAGNOSIS — Z23 Encounter for immunization: Secondary | ICD-10-CM | POA: Diagnosis not present

## 2023-02-27 DIAGNOSIS — D631 Anemia in chronic kidney disease: Secondary | ICD-10-CM | POA: Diagnosis not present

## 2023-02-27 DIAGNOSIS — N2581 Secondary hyperparathyroidism of renal origin: Secondary | ICD-10-CM | POA: Diagnosis not present

## 2023-02-27 DIAGNOSIS — N186 End stage renal disease: Secondary | ICD-10-CM | POA: Diagnosis not present

## 2023-02-27 DIAGNOSIS — D509 Iron deficiency anemia, unspecified: Secondary | ICD-10-CM | POA: Diagnosis not present

## 2023-03-01 DIAGNOSIS — N186 End stage renal disease: Secondary | ICD-10-CM | POA: Diagnosis not present

## 2023-03-01 DIAGNOSIS — D509 Iron deficiency anemia, unspecified: Secondary | ICD-10-CM | POA: Diagnosis not present

## 2023-03-01 DIAGNOSIS — D631 Anemia in chronic kidney disease: Secondary | ICD-10-CM | POA: Diagnosis not present

## 2023-03-01 DIAGNOSIS — N2581 Secondary hyperparathyroidism of renal origin: Secondary | ICD-10-CM | POA: Diagnosis not present

## 2023-03-01 DIAGNOSIS — Z23 Encounter for immunization: Secondary | ICD-10-CM | POA: Diagnosis not present

## 2023-03-04 DIAGNOSIS — D631 Anemia in chronic kidney disease: Secondary | ICD-10-CM | POA: Diagnosis not present

## 2023-03-04 DIAGNOSIS — N186 End stage renal disease: Secondary | ICD-10-CM | POA: Diagnosis not present

## 2023-03-04 DIAGNOSIS — D509 Iron deficiency anemia, unspecified: Secondary | ICD-10-CM | POA: Diagnosis not present

## 2023-03-04 DIAGNOSIS — Z23 Encounter for immunization: Secondary | ICD-10-CM | POA: Diagnosis not present

## 2023-03-04 DIAGNOSIS — N2581 Secondary hyperparathyroidism of renal origin: Secondary | ICD-10-CM | POA: Diagnosis not present

## 2023-03-06 DIAGNOSIS — D509 Iron deficiency anemia, unspecified: Secondary | ICD-10-CM | POA: Diagnosis not present

## 2023-03-06 DIAGNOSIS — N2581 Secondary hyperparathyroidism of renal origin: Secondary | ICD-10-CM | POA: Diagnosis not present

## 2023-03-06 DIAGNOSIS — Z23 Encounter for immunization: Secondary | ICD-10-CM | POA: Diagnosis not present

## 2023-03-06 DIAGNOSIS — D631 Anemia in chronic kidney disease: Secondary | ICD-10-CM | POA: Diagnosis not present

## 2023-03-06 DIAGNOSIS — N186 End stage renal disease: Secondary | ICD-10-CM | POA: Diagnosis not present

## 2023-03-08 DIAGNOSIS — D631 Anemia in chronic kidney disease: Secondary | ICD-10-CM | POA: Diagnosis not present

## 2023-03-08 DIAGNOSIS — Z23 Encounter for immunization: Secondary | ICD-10-CM | POA: Diagnosis not present

## 2023-03-08 DIAGNOSIS — N186 End stage renal disease: Secondary | ICD-10-CM | POA: Diagnosis not present

## 2023-03-08 DIAGNOSIS — N2581 Secondary hyperparathyroidism of renal origin: Secondary | ICD-10-CM | POA: Diagnosis not present

## 2023-03-08 DIAGNOSIS — D509 Iron deficiency anemia, unspecified: Secondary | ICD-10-CM | POA: Diagnosis not present

## 2023-03-11 DIAGNOSIS — D631 Anemia in chronic kidney disease: Secondary | ICD-10-CM | POA: Diagnosis not present

## 2023-03-11 DIAGNOSIS — N2581 Secondary hyperparathyroidism of renal origin: Secondary | ICD-10-CM | POA: Diagnosis not present

## 2023-03-11 DIAGNOSIS — N186 End stage renal disease: Secondary | ICD-10-CM | POA: Diagnosis not present

## 2023-03-11 DIAGNOSIS — D509 Iron deficiency anemia, unspecified: Secondary | ICD-10-CM | POA: Diagnosis not present

## 2023-03-11 DIAGNOSIS — Z23 Encounter for immunization: Secondary | ICD-10-CM | POA: Diagnosis not present

## 2023-03-13 DIAGNOSIS — D509 Iron deficiency anemia, unspecified: Secondary | ICD-10-CM | POA: Diagnosis not present

## 2023-03-13 DIAGNOSIS — D631 Anemia in chronic kidney disease: Secondary | ICD-10-CM | POA: Diagnosis not present

## 2023-03-13 DIAGNOSIS — N2581 Secondary hyperparathyroidism of renal origin: Secondary | ICD-10-CM | POA: Diagnosis not present

## 2023-03-13 DIAGNOSIS — E119 Type 2 diabetes mellitus without complications: Secondary | ICD-10-CM | POA: Diagnosis not present

## 2023-03-13 DIAGNOSIS — N186 End stage renal disease: Secondary | ICD-10-CM | POA: Diagnosis not present

## 2023-03-15 DIAGNOSIS — N186 End stage renal disease: Secondary | ICD-10-CM | POA: Diagnosis not present

## 2023-03-15 DIAGNOSIS — N2581 Secondary hyperparathyroidism of renal origin: Secondary | ICD-10-CM | POA: Diagnosis not present

## 2023-03-15 DIAGNOSIS — D509 Iron deficiency anemia, unspecified: Secondary | ICD-10-CM | POA: Diagnosis not present

## 2023-03-15 DIAGNOSIS — D631 Anemia in chronic kidney disease: Secondary | ICD-10-CM | POA: Diagnosis not present

## 2023-03-18 DIAGNOSIS — D509 Iron deficiency anemia, unspecified: Secondary | ICD-10-CM | POA: Diagnosis not present

## 2023-03-18 DIAGNOSIS — N186 End stage renal disease: Secondary | ICD-10-CM | POA: Diagnosis not present

## 2023-03-18 DIAGNOSIS — N2581 Secondary hyperparathyroidism of renal origin: Secondary | ICD-10-CM | POA: Diagnosis not present

## 2023-03-18 DIAGNOSIS — D631 Anemia in chronic kidney disease: Secondary | ICD-10-CM | POA: Diagnosis not present

## 2023-03-20 DIAGNOSIS — N2581 Secondary hyperparathyroidism of renal origin: Secondary | ICD-10-CM | POA: Diagnosis not present

## 2023-03-20 DIAGNOSIS — D631 Anemia in chronic kidney disease: Secondary | ICD-10-CM | POA: Diagnosis not present

## 2023-03-20 DIAGNOSIS — N186 End stage renal disease: Secondary | ICD-10-CM | POA: Diagnosis not present

## 2023-03-20 DIAGNOSIS — D509 Iron deficiency anemia, unspecified: Secondary | ICD-10-CM | POA: Diagnosis not present

## 2023-03-22 DIAGNOSIS — D631 Anemia in chronic kidney disease: Secondary | ICD-10-CM | POA: Diagnosis not present

## 2023-03-22 DIAGNOSIS — N2581 Secondary hyperparathyroidism of renal origin: Secondary | ICD-10-CM | POA: Diagnosis not present

## 2023-03-22 DIAGNOSIS — D509 Iron deficiency anemia, unspecified: Secondary | ICD-10-CM | POA: Diagnosis not present

## 2023-03-22 DIAGNOSIS — N186 End stage renal disease: Secondary | ICD-10-CM | POA: Diagnosis not present

## 2023-03-25 DIAGNOSIS — N186 End stage renal disease: Secondary | ICD-10-CM | POA: Diagnosis not present

## 2023-03-25 DIAGNOSIS — D631 Anemia in chronic kidney disease: Secondary | ICD-10-CM | POA: Diagnosis not present

## 2023-03-25 DIAGNOSIS — D509 Iron deficiency anemia, unspecified: Secondary | ICD-10-CM | POA: Diagnosis not present

## 2023-03-25 DIAGNOSIS — N2581 Secondary hyperparathyroidism of renal origin: Secondary | ICD-10-CM | POA: Diagnosis not present

## 2023-03-27 DIAGNOSIS — N2581 Secondary hyperparathyroidism of renal origin: Secondary | ICD-10-CM | POA: Diagnosis not present

## 2023-03-27 DIAGNOSIS — D631 Anemia in chronic kidney disease: Secondary | ICD-10-CM | POA: Diagnosis not present

## 2023-03-27 DIAGNOSIS — N186 End stage renal disease: Secondary | ICD-10-CM | POA: Diagnosis not present

## 2023-03-27 DIAGNOSIS — D509 Iron deficiency anemia, unspecified: Secondary | ICD-10-CM | POA: Diagnosis not present

## 2023-03-29 DIAGNOSIS — N186 End stage renal disease: Secondary | ICD-10-CM | POA: Diagnosis not present

## 2023-03-29 DIAGNOSIS — D631 Anemia in chronic kidney disease: Secondary | ICD-10-CM | POA: Diagnosis not present

## 2023-03-29 DIAGNOSIS — D509 Iron deficiency anemia, unspecified: Secondary | ICD-10-CM | POA: Diagnosis not present

## 2023-03-29 DIAGNOSIS — N2581 Secondary hyperparathyroidism of renal origin: Secondary | ICD-10-CM | POA: Diagnosis not present

## 2023-04-01 DIAGNOSIS — N186 End stage renal disease: Secondary | ICD-10-CM | POA: Diagnosis not present

## 2023-04-01 DIAGNOSIS — D631 Anemia in chronic kidney disease: Secondary | ICD-10-CM | POA: Diagnosis not present

## 2023-04-01 DIAGNOSIS — N2581 Secondary hyperparathyroidism of renal origin: Secondary | ICD-10-CM | POA: Diagnosis not present

## 2023-04-01 DIAGNOSIS — D509 Iron deficiency anemia, unspecified: Secondary | ICD-10-CM | POA: Diagnosis not present

## 2023-04-03 DIAGNOSIS — N2581 Secondary hyperparathyroidism of renal origin: Secondary | ICD-10-CM | POA: Diagnosis not present

## 2023-04-03 DIAGNOSIS — D509 Iron deficiency anemia, unspecified: Secondary | ICD-10-CM | POA: Diagnosis not present

## 2023-04-03 DIAGNOSIS — N186 End stage renal disease: Secondary | ICD-10-CM | POA: Diagnosis not present

## 2023-04-03 DIAGNOSIS — D631 Anemia in chronic kidney disease: Secondary | ICD-10-CM | POA: Diagnosis not present

## 2023-04-05 DIAGNOSIS — N2581 Secondary hyperparathyroidism of renal origin: Secondary | ICD-10-CM | POA: Diagnosis not present

## 2023-04-05 DIAGNOSIS — D631 Anemia in chronic kidney disease: Secondary | ICD-10-CM | POA: Diagnosis not present

## 2023-04-05 DIAGNOSIS — N186 End stage renal disease: Secondary | ICD-10-CM | POA: Diagnosis not present

## 2023-04-05 DIAGNOSIS — D509 Iron deficiency anemia, unspecified: Secondary | ICD-10-CM | POA: Diagnosis not present

## 2023-04-08 DIAGNOSIS — N186 End stage renal disease: Secondary | ICD-10-CM | POA: Diagnosis not present

## 2023-04-08 DIAGNOSIS — D631 Anemia in chronic kidney disease: Secondary | ICD-10-CM | POA: Diagnosis not present

## 2023-04-08 DIAGNOSIS — N2581 Secondary hyperparathyroidism of renal origin: Secondary | ICD-10-CM | POA: Diagnosis not present

## 2023-04-08 DIAGNOSIS — D509 Iron deficiency anemia, unspecified: Secondary | ICD-10-CM | POA: Diagnosis not present

## 2023-04-10 DIAGNOSIS — D509 Iron deficiency anemia, unspecified: Secondary | ICD-10-CM | POA: Diagnosis not present

## 2023-04-10 DIAGNOSIS — D631 Anemia in chronic kidney disease: Secondary | ICD-10-CM | POA: Diagnosis not present

## 2023-04-10 DIAGNOSIS — N186 End stage renal disease: Secondary | ICD-10-CM | POA: Diagnosis not present

## 2023-04-10 DIAGNOSIS — N2581 Secondary hyperparathyroidism of renal origin: Secondary | ICD-10-CM | POA: Diagnosis not present

## 2023-04-11 DIAGNOSIS — N186 End stage renal disease: Secondary | ICD-10-CM | POA: Diagnosis not present

## 2023-04-11 DIAGNOSIS — Z992 Dependence on renal dialysis: Secondary | ICD-10-CM | POA: Diagnosis not present

## 2023-04-12 ENCOUNTER — Other Ambulatory Visit: Payer: Self-pay | Admitting: Student

## 2023-04-12 DIAGNOSIS — N2581 Secondary hyperparathyroidism of renal origin: Secondary | ICD-10-CM | POA: Diagnosis not present

## 2023-04-12 DIAGNOSIS — Z992 Dependence on renal dialysis: Secondary | ICD-10-CM | POA: Diagnosis not present

## 2023-04-12 DIAGNOSIS — N186 End stage renal disease: Secondary | ICD-10-CM | POA: Diagnosis not present

## 2023-04-12 DIAGNOSIS — D631 Anemia in chronic kidney disease: Secondary | ICD-10-CM | POA: Diagnosis not present

## 2023-04-12 DIAGNOSIS — I5033 Acute on chronic diastolic (congestive) heart failure: Secondary | ICD-10-CM

## 2023-04-12 DIAGNOSIS — D509 Iron deficiency anemia, unspecified: Secondary | ICD-10-CM | POA: Diagnosis not present

## 2023-04-15 DIAGNOSIS — D509 Iron deficiency anemia, unspecified: Secondary | ICD-10-CM | POA: Diagnosis not present

## 2023-04-15 DIAGNOSIS — D631 Anemia in chronic kidney disease: Secondary | ICD-10-CM | POA: Diagnosis not present

## 2023-04-15 DIAGNOSIS — N2581 Secondary hyperparathyroidism of renal origin: Secondary | ICD-10-CM | POA: Diagnosis not present

## 2023-04-15 DIAGNOSIS — N186 End stage renal disease: Secondary | ICD-10-CM | POA: Diagnosis not present

## 2023-04-17 DIAGNOSIS — D509 Iron deficiency anemia, unspecified: Secondary | ICD-10-CM | POA: Diagnosis not present

## 2023-04-17 DIAGNOSIS — N186 End stage renal disease: Secondary | ICD-10-CM | POA: Diagnosis not present

## 2023-04-17 DIAGNOSIS — E119 Type 2 diabetes mellitus without complications: Secondary | ICD-10-CM | POA: Diagnosis not present

## 2023-04-17 DIAGNOSIS — D631 Anemia in chronic kidney disease: Secondary | ICD-10-CM | POA: Diagnosis not present

## 2023-04-17 DIAGNOSIS — N2581 Secondary hyperparathyroidism of renal origin: Secondary | ICD-10-CM | POA: Diagnosis not present

## 2023-04-19 DIAGNOSIS — N2581 Secondary hyperparathyroidism of renal origin: Secondary | ICD-10-CM | POA: Diagnosis not present

## 2023-04-19 DIAGNOSIS — D631 Anemia in chronic kidney disease: Secondary | ICD-10-CM | POA: Diagnosis not present

## 2023-04-19 DIAGNOSIS — D509 Iron deficiency anemia, unspecified: Secondary | ICD-10-CM | POA: Diagnosis not present

## 2023-04-19 DIAGNOSIS — N186 End stage renal disease: Secondary | ICD-10-CM | POA: Diagnosis not present

## 2023-04-22 DIAGNOSIS — D631 Anemia in chronic kidney disease: Secondary | ICD-10-CM | POA: Diagnosis not present

## 2023-04-22 DIAGNOSIS — D509 Iron deficiency anemia, unspecified: Secondary | ICD-10-CM | POA: Diagnosis not present

## 2023-04-22 DIAGNOSIS — N2581 Secondary hyperparathyroidism of renal origin: Secondary | ICD-10-CM | POA: Diagnosis not present

## 2023-04-22 DIAGNOSIS — N186 End stage renal disease: Secondary | ICD-10-CM | POA: Diagnosis not present

## 2023-04-24 DIAGNOSIS — D509 Iron deficiency anemia, unspecified: Secondary | ICD-10-CM | POA: Diagnosis not present

## 2023-04-24 DIAGNOSIS — D631 Anemia in chronic kidney disease: Secondary | ICD-10-CM | POA: Diagnosis not present

## 2023-04-24 DIAGNOSIS — N2581 Secondary hyperparathyroidism of renal origin: Secondary | ICD-10-CM | POA: Diagnosis not present

## 2023-04-24 DIAGNOSIS — N186 End stage renal disease: Secondary | ICD-10-CM | POA: Diagnosis not present

## 2023-04-26 DIAGNOSIS — D631 Anemia in chronic kidney disease: Secondary | ICD-10-CM | POA: Diagnosis not present

## 2023-04-26 DIAGNOSIS — N2581 Secondary hyperparathyroidism of renal origin: Secondary | ICD-10-CM | POA: Diagnosis not present

## 2023-04-26 DIAGNOSIS — N186 End stage renal disease: Secondary | ICD-10-CM | POA: Diagnosis not present

## 2023-04-26 DIAGNOSIS — D509 Iron deficiency anemia, unspecified: Secondary | ICD-10-CM | POA: Diagnosis not present

## 2023-04-29 DIAGNOSIS — N186 End stage renal disease: Secondary | ICD-10-CM | POA: Diagnosis not present

## 2023-04-29 DIAGNOSIS — N2581 Secondary hyperparathyroidism of renal origin: Secondary | ICD-10-CM | POA: Diagnosis not present

## 2023-04-29 DIAGNOSIS — D509 Iron deficiency anemia, unspecified: Secondary | ICD-10-CM | POA: Diagnosis not present

## 2023-04-29 DIAGNOSIS — D631 Anemia in chronic kidney disease: Secondary | ICD-10-CM | POA: Diagnosis not present

## 2023-05-01 DIAGNOSIS — D509 Iron deficiency anemia, unspecified: Secondary | ICD-10-CM | POA: Diagnosis not present

## 2023-05-01 DIAGNOSIS — N2581 Secondary hyperparathyroidism of renal origin: Secondary | ICD-10-CM | POA: Diagnosis not present

## 2023-05-01 DIAGNOSIS — N186 End stage renal disease: Secondary | ICD-10-CM | POA: Diagnosis not present

## 2023-05-01 DIAGNOSIS — D631 Anemia in chronic kidney disease: Secondary | ICD-10-CM | POA: Diagnosis not present

## 2023-05-03 DIAGNOSIS — N186 End stage renal disease: Secondary | ICD-10-CM | POA: Diagnosis not present

## 2023-05-03 DIAGNOSIS — N2581 Secondary hyperparathyroidism of renal origin: Secondary | ICD-10-CM | POA: Diagnosis not present

## 2023-05-03 DIAGNOSIS — D631 Anemia in chronic kidney disease: Secondary | ICD-10-CM | POA: Diagnosis not present

## 2023-05-03 DIAGNOSIS — D509 Iron deficiency anemia, unspecified: Secondary | ICD-10-CM | POA: Diagnosis not present

## 2023-05-05 DIAGNOSIS — N2581 Secondary hyperparathyroidism of renal origin: Secondary | ICD-10-CM | POA: Diagnosis not present

## 2023-05-05 DIAGNOSIS — D631 Anemia in chronic kidney disease: Secondary | ICD-10-CM | POA: Diagnosis not present

## 2023-05-05 DIAGNOSIS — N186 End stage renal disease: Secondary | ICD-10-CM | POA: Diagnosis not present

## 2023-05-05 DIAGNOSIS — D509 Iron deficiency anemia, unspecified: Secondary | ICD-10-CM | POA: Diagnosis not present

## 2023-05-07 DIAGNOSIS — N2581 Secondary hyperparathyroidism of renal origin: Secondary | ICD-10-CM | POA: Diagnosis not present

## 2023-05-07 DIAGNOSIS — D631 Anemia in chronic kidney disease: Secondary | ICD-10-CM | POA: Diagnosis not present

## 2023-05-07 DIAGNOSIS — D509 Iron deficiency anemia, unspecified: Secondary | ICD-10-CM | POA: Diagnosis not present

## 2023-05-07 DIAGNOSIS — N186 End stage renal disease: Secondary | ICD-10-CM | POA: Diagnosis not present

## 2023-05-10 DIAGNOSIS — D509 Iron deficiency anemia, unspecified: Secondary | ICD-10-CM | POA: Diagnosis not present

## 2023-05-10 DIAGNOSIS — N186 End stage renal disease: Secondary | ICD-10-CM | POA: Diagnosis not present

## 2023-05-10 DIAGNOSIS — D631 Anemia in chronic kidney disease: Secondary | ICD-10-CM | POA: Diagnosis not present

## 2023-05-10 DIAGNOSIS — N2581 Secondary hyperparathyroidism of renal origin: Secondary | ICD-10-CM | POA: Diagnosis not present

## 2023-05-12 DIAGNOSIS — Z992 Dependence on renal dialysis: Secondary | ICD-10-CM | POA: Diagnosis not present

## 2023-05-12 DIAGNOSIS — N186 End stage renal disease: Secondary | ICD-10-CM | POA: Diagnosis not present

## 2023-05-13 DIAGNOSIS — D509 Iron deficiency anemia, unspecified: Secondary | ICD-10-CM | POA: Diagnosis not present

## 2023-05-13 DIAGNOSIS — D631 Anemia in chronic kidney disease: Secondary | ICD-10-CM | POA: Diagnosis not present

## 2023-05-13 DIAGNOSIS — N2581 Secondary hyperparathyroidism of renal origin: Secondary | ICD-10-CM | POA: Diagnosis not present

## 2023-05-13 DIAGNOSIS — N186 End stage renal disease: Secondary | ICD-10-CM | POA: Diagnosis not present

## 2023-05-15 DIAGNOSIS — E119 Type 2 diabetes mellitus without complications: Secondary | ICD-10-CM | POA: Diagnosis not present

## 2023-05-15 DIAGNOSIS — N186 End stage renal disease: Secondary | ICD-10-CM | POA: Diagnosis not present

## 2023-05-15 DIAGNOSIS — D509 Iron deficiency anemia, unspecified: Secondary | ICD-10-CM | POA: Diagnosis not present

## 2023-05-15 DIAGNOSIS — D631 Anemia in chronic kidney disease: Secondary | ICD-10-CM | POA: Diagnosis not present

## 2023-05-15 DIAGNOSIS — N2581 Secondary hyperparathyroidism of renal origin: Secondary | ICD-10-CM | POA: Diagnosis not present

## 2023-05-17 DIAGNOSIS — D631 Anemia in chronic kidney disease: Secondary | ICD-10-CM | POA: Diagnosis not present

## 2023-05-17 DIAGNOSIS — D509 Iron deficiency anemia, unspecified: Secondary | ICD-10-CM | POA: Diagnosis not present

## 2023-05-17 DIAGNOSIS — N2581 Secondary hyperparathyroidism of renal origin: Secondary | ICD-10-CM | POA: Diagnosis not present

## 2023-05-17 DIAGNOSIS — N186 End stage renal disease: Secondary | ICD-10-CM | POA: Diagnosis not present

## 2023-05-20 DIAGNOSIS — N2581 Secondary hyperparathyroidism of renal origin: Secondary | ICD-10-CM | POA: Diagnosis not present

## 2023-05-20 DIAGNOSIS — D631 Anemia in chronic kidney disease: Secondary | ICD-10-CM | POA: Diagnosis not present

## 2023-05-20 DIAGNOSIS — N186 End stage renal disease: Secondary | ICD-10-CM | POA: Diagnosis not present

## 2023-05-20 DIAGNOSIS — D509 Iron deficiency anemia, unspecified: Secondary | ICD-10-CM | POA: Diagnosis not present

## 2023-05-21 DIAGNOSIS — Z961 Presence of intraocular lens: Secondary | ICD-10-CM | POA: Diagnosis not present

## 2023-05-21 DIAGNOSIS — H401133 Primary open-angle glaucoma, bilateral, severe stage: Secondary | ICD-10-CM | POA: Diagnosis not present

## 2023-05-21 DIAGNOSIS — H31091 Other chorioretinal scars, right eye: Secondary | ICD-10-CM | POA: Diagnosis not present

## 2023-05-21 DIAGNOSIS — E113593 Type 2 diabetes mellitus with proliferative diabetic retinopathy without macular edema, bilateral: Secondary | ICD-10-CM | POA: Diagnosis not present

## 2023-05-21 DIAGNOSIS — H25812 Combined forms of age-related cataract, left eye: Secondary | ICD-10-CM | POA: Diagnosis not present

## 2023-05-22 DIAGNOSIS — N2581 Secondary hyperparathyroidism of renal origin: Secondary | ICD-10-CM | POA: Diagnosis not present

## 2023-05-22 DIAGNOSIS — N186 End stage renal disease: Secondary | ICD-10-CM | POA: Diagnosis not present

## 2023-05-22 DIAGNOSIS — D631 Anemia in chronic kidney disease: Secondary | ICD-10-CM | POA: Diagnosis not present

## 2023-05-22 DIAGNOSIS — D509 Iron deficiency anemia, unspecified: Secondary | ICD-10-CM | POA: Diagnosis not present

## 2023-05-24 DIAGNOSIS — N186 End stage renal disease: Secondary | ICD-10-CM | POA: Diagnosis not present

## 2023-05-24 DIAGNOSIS — N2581 Secondary hyperparathyroidism of renal origin: Secondary | ICD-10-CM | POA: Diagnosis not present

## 2023-05-24 DIAGNOSIS — D631 Anemia in chronic kidney disease: Secondary | ICD-10-CM | POA: Diagnosis not present

## 2023-05-24 DIAGNOSIS — D509 Iron deficiency anemia, unspecified: Secondary | ICD-10-CM | POA: Diagnosis not present

## 2023-05-27 DIAGNOSIS — N186 End stage renal disease: Secondary | ICD-10-CM | POA: Diagnosis not present

## 2023-05-27 DIAGNOSIS — D631 Anemia in chronic kidney disease: Secondary | ICD-10-CM | POA: Diagnosis not present

## 2023-05-27 DIAGNOSIS — D509 Iron deficiency anemia, unspecified: Secondary | ICD-10-CM | POA: Diagnosis not present

## 2023-05-27 DIAGNOSIS — N2581 Secondary hyperparathyroidism of renal origin: Secondary | ICD-10-CM | POA: Diagnosis not present

## 2023-05-29 DIAGNOSIS — D631 Anemia in chronic kidney disease: Secondary | ICD-10-CM | POA: Diagnosis not present

## 2023-05-29 DIAGNOSIS — N2581 Secondary hyperparathyroidism of renal origin: Secondary | ICD-10-CM | POA: Diagnosis not present

## 2023-05-29 DIAGNOSIS — N186 End stage renal disease: Secondary | ICD-10-CM | POA: Diagnosis not present

## 2023-05-29 DIAGNOSIS — D509 Iron deficiency anemia, unspecified: Secondary | ICD-10-CM | POA: Diagnosis not present

## 2023-05-31 DIAGNOSIS — N186 End stage renal disease: Secondary | ICD-10-CM | POA: Diagnosis not present

## 2023-05-31 DIAGNOSIS — D509 Iron deficiency anemia, unspecified: Secondary | ICD-10-CM | POA: Diagnosis not present

## 2023-05-31 DIAGNOSIS — D631 Anemia in chronic kidney disease: Secondary | ICD-10-CM | POA: Diagnosis not present

## 2023-05-31 DIAGNOSIS — N2581 Secondary hyperparathyroidism of renal origin: Secondary | ICD-10-CM | POA: Diagnosis not present

## 2023-06-02 DIAGNOSIS — D631 Anemia in chronic kidney disease: Secondary | ICD-10-CM | POA: Diagnosis not present

## 2023-06-02 DIAGNOSIS — N186 End stage renal disease: Secondary | ICD-10-CM | POA: Diagnosis not present

## 2023-06-02 DIAGNOSIS — N2581 Secondary hyperparathyroidism of renal origin: Secondary | ICD-10-CM | POA: Diagnosis not present

## 2023-06-02 DIAGNOSIS — D509 Iron deficiency anemia, unspecified: Secondary | ICD-10-CM | POA: Diagnosis not present

## 2023-06-04 DIAGNOSIS — D509 Iron deficiency anemia, unspecified: Secondary | ICD-10-CM | POA: Diagnosis not present

## 2023-06-04 DIAGNOSIS — D631 Anemia in chronic kidney disease: Secondary | ICD-10-CM | POA: Diagnosis not present

## 2023-06-04 DIAGNOSIS — N2581 Secondary hyperparathyroidism of renal origin: Secondary | ICD-10-CM | POA: Diagnosis not present

## 2023-06-04 DIAGNOSIS — N186 End stage renal disease: Secondary | ICD-10-CM | POA: Diagnosis not present

## 2023-06-07 DIAGNOSIS — D509 Iron deficiency anemia, unspecified: Secondary | ICD-10-CM | POA: Diagnosis not present

## 2023-06-07 DIAGNOSIS — N186 End stage renal disease: Secondary | ICD-10-CM | POA: Diagnosis not present

## 2023-06-07 DIAGNOSIS — N2581 Secondary hyperparathyroidism of renal origin: Secondary | ICD-10-CM | POA: Diagnosis not present

## 2023-06-07 DIAGNOSIS — D631 Anemia in chronic kidney disease: Secondary | ICD-10-CM | POA: Diagnosis not present

## 2023-06-09 DIAGNOSIS — D631 Anemia in chronic kidney disease: Secondary | ICD-10-CM | POA: Diagnosis not present

## 2023-06-09 DIAGNOSIS — N2581 Secondary hyperparathyroidism of renal origin: Secondary | ICD-10-CM | POA: Diagnosis not present

## 2023-06-09 DIAGNOSIS — N186 End stage renal disease: Secondary | ICD-10-CM | POA: Diagnosis not present

## 2023-06-09 DIAGNOSIS — D509 Iron deficiency anemia, unspecified: Secondary | ICD-10-CM | POA: Diagnosis not present

## 2023-06-11 DIAGNOSIS — D631 Anemia in chronic kidney disease: Secondary | ICD-10-CM | POA: Diagnosis not present

## 2023-06-11 DIAGNOSIS — N2581 Secondary hyperparathyroidism of renal origin: Secondary | ICD-10-CM | POA: Diagnosis not present

## 2023-06-11 DIAGNOSIS — D509 Iron deficiency anemia, unspecified: Secondary | ICD-10-CM | POA: Diagnosis not present

## 2023-06-11 DIAGNOSIS — Z992 Dependence on renal dialysis: Secondary | ICD-10-CM | POA: Diagnosis not present

## 2023-06-11 DIAGNOSIS — N186 End stage renal disease: Secondary | ICD-10-CM | POA: Diagnosis not present

## 2023-06-14 DIAGNOSIS — N2581 Secondary hyperparathyroidism of renal origin: Secondary | ICD-10-CM | POA: Diagnosis not present

## 2023-06-14 DIAGNOSIS — N186 End stage renal disease: Secondary | ICD-10-CM | POA: Diagnosis not present

## 2023-06-14 DIAGNOSIS — E785 Hyperlipidemia, unspecified: Secondary | ICD-10-CM | POA: Diagnosis not present

## 2023-06-14 DIAGNOSIS — D631 Anemia in chronic kidney disease: Secondary | ICD-10-CM | POA: Diagnosis not present

## 2023-06-14 DIAGNOSIS — D509 Iron deficiency anemia, unspecified: Secondary | ICD-10-CM | POA: Diagnosis not present

## 2023-06-17 DIAGNOSIS — N186 End stage renal disease: Secondary | ICD-10-CM | POA: Diagnosis not present

## 2023-06-17 DIAGNOSIS — N2581 Secondary hyperparathyroidism of renal origin: Secondary | ICD-10-CM | POA: Diagnosis not present

## 2023-06-17 DIAGNOSIS — D509 Iron deficiency anemia, unspecified: Secondary | ICD-10-CM | POA: Diagnosis not present

## 2023-06-17 DIAGNOSIS — D631 Anemia in chronic kidney disease: Secondary | ICD-10-CM | POA: Diagnosis not present

## 2023-06-17 DIAGNOSIS — E785 Hyperlipidemia, unspecified: Secondary | ICD-10-CM | POA: Diagnosis not present

## 2023-06-19 DIAGNOSIS — D509 Iron deficiency anemia, unspecified: Secondary | ICD-10-CM | POA: Diagnosis not present

## 2023-06-19 DIAGNOSIS — N186 End stage renal disease: Secondary | ICD-10-CM | POA: Diagnosis not present

## 2023-06-19 DIAGNOSIS — E119 Type 2 diabetes mellitus without complications: Secondary | ICD-10-CM | POA: Diagnosis not present

## 2023-06-19 DIAGNOSIS — N2581 Secondary hyperparathyroidism of renal origin: Secondary | ICD-10-CM | POA: Diagnosis not present

## 2023-06-19 DIAGNOSIS — D631 Anemia in chronic kidney disease: Secondary | ICD-10-CM | POA: Diagnosis not present

## 2023-06-19 DIAGNOSIS — E785 Hyperlipidemia, unspecified: Secondary | ICD-10-CM | POA: Diagnosis not present

## 2023-06-21 DIAGNOSIS — N2581 Secondary hyperparathyroidism of renal origin: Secondary | ICD-10-CM | POA: Diagnosis not present

## 2023-06-21 DIAGNOSIS — E785 Hyperlipidemia, unspecified: Secondary | ICD-10-CM | POA: Diagnosis not present

## 2023-06-21 DIAGNOSIS — D509 Iron deficiency anemia, unspecified: Secondary | ICD-10-CM | POA: Diagnosis not present

## 2023-06-21 DIAGNOSIS — N186 End stage renal disease: Secondary | ICD-10-CM | POA: Diagnosis not present

## 2023-06-21 DIAGNOSIS — D631 Anemia in chronic kidney disease: Secondary | ICD-10-CM | POA: Diagnosis not present

## 2023-06-24 DIAGNOSIS — D631 Anemia in chronic kidney disease: Secondary | ICD-10-CM | POA: Diagnosis not present

## 2023-06-24 DIAGNOSIS — E785 Hyperlipidemia, unspecified: Secondary | ICD-10-CM | POA: Diagnosis not present

## 2023-06-24 DIAGNOSIS — D509 Iron deficiency anemia, unspecified: Secondary | ICD-10-CM | POA: Diagnosis not present

## 2023-06-24 DIAGNOSIS — N2581 Secondary hyperparathyroidism of renal origin: Secondary | ICD-10-CM | POA: Diagnosis not present

## 2023-06-24 DIAGNOSIS — N186 End stage renal disease: Secondary | ICD-10-CM | POA: Diagnosis not present

## 2023-06-26 DIAGNOSIS — E785 Hyperlipidemia, unspecified: Secondary | ICD-10-CM | POA: Diagnosis not present

## 2023-06-26 DIAGNOSIS — D631 Anemia in chronic kidney disease: Secondary | ICD-10-CM | POA: Diagnosis not present

## 2023-06-26 DIAGNOSIS — N2581 Secondary hyperparathyroidism of renal origin: Secondary | ICD-10-CM | POA: Diagnosis not present

## 2023-06-26 DIAGNOSIS — D509 Iron deficiency anemia, unspecified: Secondary | ICD-10-CM | POA: Diagnosis not present

## 2023-06-26 DIAGNOSIS — N186 End stage renal disease: Secondary | ICD-10-CM | POA: Diagnosis not present

## 2023-06-28 DIAGNOSIS — D631 Anemia in chronic kidney disease: Secondary | ICD-10-CM | POA: Diagnosis not present

## 2023-06-28 DIAGNOSIS — D509 Iron deficiency anemia, unspecified: Secondary | ICD-10-CM | POA: Diagnosis not present

## 2023-06-28 DIAGNOSIS — N2581 Secondary hyperparathyroidism of renal origin: Secondary | ICD-10-CM | POA: Diagnosis not present

## 2023-06-28 DIAGNOSIS — N186 End stage renal disease: Secondary | ICD-10-CM | POA: Diagnosis not present

## 2023-06-28 DIAGNOSIS — E785 Hyperlipidemia, unspecified: Secondary | ICD-10-CM | POA: Diagnosis not present

## 2023-07-01 DIAGNOSIS — E785 Hyperlipidemia, unspecified: Secondary | ICD-10-CM | POA: Diagnosis not present

## 2023-07-01 DIAGNOSIS — N186 End stage renal disease: Secondary | ICD-10-CM | POA: Diagnosis not present

## 2023-07-01 DIAGNOSIS — N2581 Secondary hyperparathyroidism of renal origin: Secondary | ICD-10-CM | POA: Diagnosis not present

## 2023-07-01 DIAGNOSIS — D631 Anemia in chronic kidney disease: Secondary | ICD-10-CM | POA: Diagnosis not present

## 2023-07-01 DIAGNOSIS — D509 Iron deficiency anemia, unspecified: Secondary | ICD-10-CM | POA: Diagnosis not present

## 2023-07-03 DIAGNOSIS — E785 Hyperlipidemia, unspecified: Secondary | ICD-10-CM | POA: Diagnosis not present

## 2023-07-03 DIAGNOSIS — N2581 Secondary hyperparathyroidism of renal origin: Secondary | ICD-10-CM | POA: Diagnosis not present

## 2023-07-03 DIAGNOSIS — N186 End stage renal disease: Secondary | ICD-10-CM | POA: Diagnosis not present

## 2023-07-03 DIAGNOSIS — D631 Anemia in chronic kidney disease: Secondary | ICD-10-CM | POA: Diagnosis not present

## 2023-07-03 DIAGNOSIS — D509 Iron deficiency anemia, unspecified: Secondary | ICD-10-CM | POA: Diagnosis not present

## 2023-07-05 DIAGNOSIS — E785 Hyperlipidemia, unspecified: Secondary | ICD-10-CM | POA: Diagnosis not present

## 2023-07-05 DIAGNOSIS — N2581 Secondary hyperparathyroidism of renal origin: Secondary | ICD-10-CM | POA: Diagnosis not present

## 2023-07-05 DIAGNOSIS — D509 Iron deficiency anemia, unspecified: Secondary | ICD-10-CM | POA: Diagnosis not present

## 2023-07-05 DIAGNOSIS — D631 Anemia in chronic kidney disease: Secondary | ICD-10-CM | POA: Diagnosis not present

## 2023-07-05 DIAGNOSIS — N186 End stage renal disease: Secondary | ICD-10-CM | POA: Diagnosis not present

## 2023-07-08 DIAGNOSIS — E785 Hyperlipidemia, unspecified: Secondary | ICD-10-CM | POA: Diagnosis not present

## 2023-07-08 DIAGNOSIS — D631 Anemia in chronic kidney disease: Secondary | ICD-10-CM | POA: Diagnosis not present

## 2023-07-08 DIAGNOSIS — D509 Iron deficiency anemia, unspecified: Secondary | ICD-10-CM | POA: Diagnosis not present

## 2023-07-08 DIAGNOSIS — N186 End stage renal disease: Secondary | ICD-10-CM | POA: Diagnosis not present

## 2023-07-08 DIAGNOSIS — N2581 Secondary hyperparathyroidism of renal origin: Secondary | ICD-10-CM | POA: Diagnosis not present

## 2023-07-10 DIAGNOSIS — D631 Anemia in chronic kidney disease: Secondary | ICD-10-CM | POA: Diagnosis not present

## 2023-07-10 DIAGNOSIS — D509 Iron deficiency anemia, unspecified: Secondary | ICD-10-CM | POA: Diagnosis not present

## 2023-07-10 DIAGNOSIS — E785 Hyperlipidemia, unspecified: Secondary | ICD-10-CM | POA: Diagnosis not present

## 2023-07-10 DIAGNOSIS — N2581 Secondary hyperparathyroidism of renal origin: Secondary | ICD-10-CM | POA: Diagnosis not present

## 2023-07-10 DIAGNOSIS — N186 End stage renal disease: Secondary | ICD-10-CM | POA: Diagnosis not present

## 2023-07-12 DIAGNOSIS — N186 End stage renal disease: Secondary | ICD-10-CM | POA: Diagnosis not present

## 2023-07-12 DIAGNOSIS — Z992 Dependence on renal dialysis: Secondary | ICD-10-CM | POA: Diagnosis not present

## 2023-07-12 DIAGNOSIS — N2581 Secondary hyperparathyroidism of renal origin: Secondary | ICD-10-CM | POA: Diagnosis not present

## 2023-07-12 DIAGNOSIS — E785 Hyperlipidemia, unspecified: Secondary | ICD-10-CM | POA: Diagnosis not present

## 2023-07-12 DIAGNOSIS — D509 Iron deficiency anemia, unspecified: Secondary | ICD-10-CM | POA: Diagnosis not present

## 2023-07-12 DIAGNOSIS — D631 Anemia in chronic kidney disease: Secondary | ICD-10-CM | POA: Diagnosis not present

## 2023-07-15 DIAGNOSIS — N2581 Secondary hyperparathyroidism of renal origin: Secondary | ICD-10-CM | POA: Diagnosis not present

## 2023-07-15 DIAGNOSIS — N186 End stage renal disease: Secondary | ICD-10-CM | POA: Diagnosis not present

## 2023-07-15 DIAGNOSIS — D509 Iron deficiency anemia, unspecified: Secondary | ICD-10-CM | POA: Diagnosis not present

## 2023-07-15 DIAGNOSIS — D631 Anemia in chronic kidney disease: Secondary | ICD-10-CM | POA: Diagnosis not present

## 2023-07-17 DIAGNOSIS — N2581 Secondary hyperparathyroidism of renal origin: Secondary | ICD-10-CM | POA: Diagnosis not present

## 2023-07-17 DIAGNOSIS — D631 Anemia in chronic kidney disease: Secondary | ICD-10-CM | POA: Diagnosis not present

## 2023-07-17 DIAGNOSIS — E119 Type 2 diabetes mellitus without complications: Secondary | ICD-10-CM | POA: Diagnosis not present

## 2023-07-17 DIAGNOSIS — D509 Iron deficiency anemia, unspecified: Secondary | ICD-10-CM | POA: Diagnosis not present

## 2023-07-17 DIAGNOSIS — N186 End stage renal disease: Secondary | ICD-10-CM | POA: Diagnosis not present

## 2023-07-19 DIAGNOSIS — N2581 Secondary hyperparathyroidism of renal origin: Secondary | ICD-10-CM | POA: Diagnosis not present

## 2023-07-19 DIAGNOSIS — N186 End stage renal disease: Secondary | ICD-10-CM | POA: Diagnosis not present

## 2023-07-19 DIAGNOSIS — D631 Anemia in chronic kidney disease: Secondary | ICD-10-CM | POA: Diagnosis not present

## 2023-07-19 DIAGNOSIS — D509 Iron deficiency anemia, unspecified: Secondary | ICD-10-CM | POA: Diagnosis not present

## 2023-07-22 DIAGNOSIS — D631 Anemia in chronic kidney disease: Secondary | ICD-10-CM | POA: Diagnosis not present

## 2023-07-22 DIAGNOSIS — N186 End stage renal disease: Secondary | ICD-10-CM | POA: Diagnosis not present

## 2023-07-22 DIAGNOSIS — N2581 Secondary hyperparathyroidism of renal origin: Secondary | ICD-10-CM | POA: Diagnosis not present

## 2023-07-22 DIAGNOSIS — D509 Iron deficiency anemia, unspecified: Secondary | ICD-10-CM | POA: Diagnosis not present

## 2023-07-26 DIAGNOSIS — D631 Anemia in chronic kidney disease: Secondary | ICD-10-CM | POA: Diagnosis not present

## 2023-07-26 DIAGNOSIS — N186 End stage renal disease: Secondary | ICD-10-CM | POA: Diagnosis not present

## 2023-07-26 DIAGNOSIS — D509 Iron deficiency anemia, unspecified: Secondary | ICD-10-CM | POA: Diagnosis not present

## 2023-07-26 DIAGNOSIS — Z114 Encounter for screening for human immunodeficiency virus [HIV]: Secondary | ICD-10-CM | POA: Diagnosis not present

## 2023-07-26 DIAGNOSIS — N2581 Secondary hyperparathyroidism of renal origin: Secondary | ICD-10-CM | POA: Diagnosis not present

## 2023-07-29 DIAGNOSIS — D631 Anemia in chronic kidney disease: Secondary | ICD-10-CM | POA: Diagnosis not present

## 2023-07-29 DIAGNOSIS — N2581 Secondary hyperparathyroidism of renal origin: Secondary | ICD-10-CM | POA: Diagnosis not present

## 2023-07-29 DIAGNOSIS — D509 Iron deficiency anemia, unspecified: Secondary | ICD-10-CM | POA: Diagnosis not present

## 2023-07-29 DIAGNOSIS — N186 End stage renal disease: Secondary | ICD-10-CM | POA: Diagnosis not present

## 2023-07-31 DIAGNOSIS — D509 Iron deficiency anemia, unspecified: Secondary | ICD-10-CM | POA: Diagnosis not present

## 2023-07-31 DIAGNOSIS — D631 Anemia in chronic kidney disease: Secondary | ICD-10-CM | POA: Diagnosis not present

## 2023-07-31 DIAGNOSIS — N186 End stage renal disease: Secondary | ICD-10-CM | POA: Diagnosis not present

## 2023-07-31 DIAGNOSIS — N2581 Secondary hyperparathyroidism of renal origin: Secondary | ICD-10-CM | POA: Diagnosis not present

## 2023-08-02 ENCOUNTER — Telehealth: Payer: Self-pay

## 2023-08-02 DIAGNOSIS — N2581 Secondary hyperparathyroidism of renal origin: Secondary | ICD-10-CM | POA: Diagnosis not present

## 2023-08-02 DIAGNOSIS — N189 Chronic kidney disease, unspecified: Secondary | ICD-10-CM

## 2023-08-02 DIAGNOSIS — N186 End stage renal disease: Secondary | ICD-10-CM | POA: Diagnosis not present

## 2023-08-02 DIAGNOSIS — D509 Iron deficiency anemia, unspecified: Secondary | ICD-10-CM | POA: Diagnosis not present

## 2023-08-02 DIAGNOSIS — D631 Anemia in chronic kidney disease: Secondary | ICD-10-CM | POA: Diagnosis not present

## 2023-08-05 ENCOUNTER — Telehealth: Payer: Self-pay | Admitting: *Deleted

## 2023-08-05 DIAGNOSIS — N2581 Secondary hyperparathyroidism of renal origin: Secondary | ICD-10-CM | POA: Diagnosis not present

## 2023-08-05 DIAGNOSIS — N186 End stage renal disease: Secondary | ICD-10-CM | POA: Diagnosis not present

## 2023-08-05 DIAGNOSIS — D509 Iron deficiency anemia, unspecified: Secondary | ICD-10-CM | POA: Diagnosis not present

## 2023-08-05 DIAGNOSIS — D631 Anemia in chronic kidney disease: Secondary | ICD-10-CM | POA: Diagnosis not present

## 2023-08-05 NOTE — Progress Notes (Unsigned)
 Complex Care Management Note Care Guide Note  08/05/2023 Name: Jeffrey Zuniga MRN: [id number] DOB: [birth year]   Complex Care Management Outreach Attempts: An unsuccessful telephone outreach was attempted today to offer the patient information about available complex care management services.  Follow Up Plan:  Additional outreach attempts will be made to offer the patient complex care management information and services.   Encounter Outcome:  No Answer  Gwenevere Ghazi  [facility]  [facility], [facility] Guide  Direct Dial: [phone]  Fax [phone]

## 2023-08-06 NOTE — Progress Notes (Unsigned)
 Complex Care Management Note Care Guide Note  08/06/2023 Name: Jeffrey Zuniga MRN: [id number] DOB: March 04, 1956   Complex Care Management Outreach Attempts: A second unsuccessful outreach was attempted today to offer the patient with information about available complex care management services.  Follow Up Plan:  Additional outreach attempts will be made to offer the patient complex care management information and services.   Encounter Outcome:  No Answer  Gwenevere Ghazi  [facility]  [facility], [facility] Guide  Direct Dial: [phone]  Fax [phone]

## 2023-08-07 DIAGNOSIS — D631 Anemia in chronic kidney disease: Secondary | ICD-10-CM | POA: Diagnosis not present

## 2023-08-07 DIAGNOSIS — D509 Iron deficiency anemia, unspecified: Secondary | ICD-10-CM | POA: Diagnosis not present

## 2023-08-07 DIAGNOSIS — N186 End stage renal disease: Secondary | ICD-10-CM | POA: Diagnosis not present

## 2023-08-07 DIAGNOSIS — N2581 Secondary hyperparathyroidism of renal origin: Secondary | ICD-10-CM | POA: Diagnosis not present

## 2023-08-08 NOTE — Progress Notes (Signed)
 Complex Care Management Note Care Guide Note  08/08/2023 Name: [name] MRN: [id number] DOB: [birth year]   Complex Care Management Outreach Attempts: A third unsuccessful outreach was attempted today to offer the patient with information about available complex care management services.  Follow Up Plan:  No further outreach attempts will be made at this time. We have been unable to contact the patient to offer or enroll patient in complex care management services.  Encounter Outcome:  No Answer  Gwenevere Ghazi  [facility]  [facility], [facility] Guide  Direct Dial: [phone]  Fax [phone]

## 2023-08-09 DIAGNOSIS — D631 Anemia in chronic kidney disease: Secondary | ICD-10-CM | POA: Diagnosis not present

## 2023-08-09 DIAGNOSIS — N2581 Secondary hyperparathyroidism of renal origin: Secondary | ICD-10-CM | POA: Diagnosis not present

## 2023-08-09 DIAGNOSIS — D509 Iron deficiency anemia, unspecified: Secondary | ICD-10-CM | POA: Diagnosis not present

## 2023-08-09 DIAGNOSIS — N186 End stage renal disease: Secondary | ICD-10-CM | POA: Diagnosis not present

## 2023-08-09 DIAGNOSIS — Z992 Dependence on renal dialysis: Secondary | ICD-10-CM | POA: Diagnosis not present

## 2023-08-12 DIAGNOSIS — N2581 Secondary hyperparathyroidism of renal origin: Secondary | ICD-10-CM | POA: Diagnosis not present

## 2023-08-12 DIAGNOSIS — D631 Anemia in chronic kidney disease: Secondary | ICD-10-CM | POA: Diagnosis not present

## 2023-08-12 DIAGNOSIS — I10 Essential (primary) hypertension: Secondary | ICD-10-CM | POA: Diagnosis not present

## 2023-08-12 DIAGNOSIS — D509 Iron deficiency anemia, unspecified: Secondary | ICD-10-CM | POA: Diagnosis not present

## 2023-08-12 DIAGNOSIS — N186 End stage renal disease: Secondary | ICD-10-CM | POA: Diagnosis not present

## 2023-08-12 DIAGNOSIS — R58 Hemorrhage, not elsewhere classified: Secondary | ICD-10-CM | POA: Diagnosis not present

## 2023-08-14 DIAGNOSIS — N2581 Secondary hyperparathyroidism of renal origin: Secondary | ICD-10-CM | POA: Diagnosis not present

## 2023-08-14 DIAGNOSIS — E119 Type 2 diabetes mellitus without complications: Secondary | ICD-10-CM | POA: Diagnosis not present

## 2023-08-14 DIAGNOSIS — D509 Iron deficiency anemia, unspecified: Secondary | ICD-10-CM | POA: Diagnosis not present

## 2023-08-14 DIAGNOSIS — D631 Anemia in chronic kidney disease: Secondary | ICD-10-CM | POA: Diagnosis not present

## 2023-08-14 DIAGNOSIS — I10 Essential (primary) hypertension: Secondary | ICD-10-CM | POA: Diagnosis not present

## 2023-08-14 DIAGNOSIS — N186 End stage renal disease: Secondary | ICD-10-CM | POA: Diagnosis not present

## 2023-08-14 DIAGNOSIS — R58 Hemorrhage, not elsewhere classified: Secondary | ICD-10-CM | POA: Diagnosis not present

## 2023-08-16 DIAGNOSIS — N2581 Secondary hyperparathyroidism of renal origin: Secondary | ICD-10-CM | POA: Diagnosis not present

## 2023-08-16 DIAGNOSIS — D631 Anemia in chronic kidney disease: Secondary | ICD-10-CM | POA: Diagnosis not present

## 2023-08-16 DIAGNOSIS — D509 Iron deficiency anemia, unspecified: Secondary | ICD-10-CM | POA: Diagnosis not present

## 2023-08-16 DIAGNOSIS — I10 Essential (primary) hypertension: Secondary | ICD-10-CM | POA: Diagnosis not present

## 2023-08-16 DIAGNOSIS — R58 Hemorrhage, not elsewhere classified: Secondary | ICD-10-CM | POA: Diagnosis not present

## 2023-08-16 DIAGNOSIS — N186 End stage renal disease: Secondary | ICD-10-CM | POA: Diagnosis not present

## 2023-08-19 DIAGNOSIS — D631 Anemia in chronic kidney disease: Secondary | ICD-10-CM | POA: Diagnosis not present

## 2023-08-19 DIAGNOSIS — N186 End stage renal disease: Secondary | ICD-10-CM | POA: Diagnosis not present

## 2023-08-19 DIAGNOSIS — N2581 Secondary hyperparathyroidism of renal origin: Secondary | ICD-10-CM | POA: Diagnosis not present

## 2023-08-19 DIAGNOSIS — D509 Iron deficiency anemia, unspecified: Secondary | ICD-10-CM | POA: Diagnosis not present

## 2023-08-19 DIAGNOSIS — R58 Hemorrhage, not elsewhere classified: Secondary | ICD-10-CM | POA: Diagnosis not present

## 2023-08-19 DIAGNOSIS — I10 Essential (primary) hypertension: Secondary | ICD-10-CM | POA: Diagnosis not present

## 2023-08-21 DIAGNOSIS — D509 Iron deficiency anemia, unspecified: Secondary | ICD-10-CM | POA: Diagnosis not present

## 2023-08-21 DIAGNOSIS — D631 Anemia in chronic kidney disease: Secondary | ICD-10-CM | POA: Diagnosis not present

## 2023-08-21 DIAGNOSIS — N2581 Secondary hyperparathyroidism of renal origin: Secondary | ICD-10-CM | POA: Diagnosis not present

## 2023-08-21 DIAGNOSIS — R58 Hemorrhage, not elsewhere classified: Secondary | ICD-10-CM | POA: Diagnosis not present

## 2023-08-21 DIAGNOSIS — N186 End stage renal disease: Secondary | ICD-10-CM | POA: Diagnosis not present

## 2023-08-21 DIAGNOSIS — I10 Essential (primary) hypertension: Secondary | ICD-10-CM | POA: Diagnosis not present

## 2023-08-23 DIAGNOSIS — I10 Essential (primary) hypertension: Secondary | ICD-10-CM | POA: Diagnosis not present

## 2023-08-23 DIAGNOSIS — N186 End stage renal disease: Secondary | ICD-10-CM | POA: Diagnosis not present

## 2023-08-23 DIAGNOSIS — N2581 Secondary hyperparathyroidism of renal origin: Secondary | ICD-10-CM | POA: Diagnosis not present

## 2023-08-23 DIAGNOSIS — D509 Iron deficiency anemia, unspecified: Secondary | ICD-10-CM | POA: Diagnosis not present

## 2023-08-23 DIAGNOSIS — R58 Hemorrhage, not elsewhere classified: Secondary | ICD-10-CM | POA: Diagnosis not present

## 2023-08-23 DIAGNOSIS — D631 Anemia in chronic kidney disease: Secondary | ICD-10-CM | POA: Diagnosis not present

## 2023-08-26 DIAGNOSIS — N2581 Secondary hyperparathyroidism of renal origin: Secondary | ICD-10-CM | POA: Diagnosis not present

## 2023-08-26 DIAGNOSIS — D509 Iron deficiency anemia, unspecified: Secondary | ICD-10-CM | POA: Diagnosis not present

## 2023-08-26 DIAGNOSIS — D631 Anemia in chronic kidney disease: Secondary | ICD-10-CM | POA: Diagnosis not present

## 2023-08-26 DIAGNOSIS — N186 End stage renal disease: Secondary | ICD-10-CM | POA: Diagnosis not present

## 2023-08-26 DIAGNOSIS — I10 Essential (primary) hypertension: Secondary | ICD-10-CM | POA: Diagnosis not present

## 2023-08-26 DIAGNOSIS — R58 Hemorrhage, not elsewhere classified: Secondary | ICD-10-CM | POA: Diagnosis not present

## 2023-08-28 DIAGNOSIS — R58 Hemorrhage, not elsewhere classified: Secondary | ICD-10-CM | POA: Diagnosis not present

## 2023-08-28 DIAGNOSIS — N2581 Secondary hyperparathyroidism of renal origin: Secondary | ICD-10-CM | POA: Diagnosis not present

## 2023-08-28 DIAGNOSIS — D509 Iron deficiency anemia, unspecified: Secondary | ICD-10-CM | POA: Diagnosis not present

## 2023-08-28 DIAGNOSIS — D631 Anemia in chronic kidney disease: Secondary | ICD-10-CM | POA: Diagnosis not present

## 2023-08-28 DIAGNOSIS — N186 End stage renal disease: Secondary | ICD-10-CM | POA: Diagnosis not present

## 2023-08-28 DIAGNOSIS — I10 Essential (primary) hypertension: Secondary | ICD-10-CM | POA: Diagnosis not present

## 2023-08-30 DIAGNOSIS — D631 Anemia in chronic kidney disease: Secondary | ICD-10-CM | POA: Diagnosis not present

## 2023-08-30 DIAGNOSIS — R58 Hemorrhage, not elsewhere classified: Secondary | ICD-10-CM | POA: Diagnosis not present

## 2023-08-30 DIAGNOSIS — I10 Essential (primary) hypertension: Secondary | ICD-10-CM | POA: Diagnosis not present

## 2023-08-30 DIAGNOSIS — D509 Iron deficiency anemia, unspecified: Secondary | ICD-10-CM | POA: Diagnosis not present

## 2023-08-30 DIAGNOSIS — N186 End stage renal disease: Secondary | ICD-10-CM | POA: Diagnosis not present

## 2023-08-30 DIAGNOSIS — N2581 Secondary hyperparathyroidism of renal origin: Secondary | ICD-10-CM | POA: Diagnosis not present

## 2023-09-02 DIAGNOSIS — N186 End stage renal disease: Secondary | ICD-10-CM | POA: Diagnosis not present

## 2023-09-02 DIAGNOSIS — N2581 Secondary hyperparathyroidism of renal origin: Secondary | ICD-10-CM | POA: Diagnosis not present

## 2023-09-02 DIAGNOSIS — R58 Hemorrhage, not elsewhere classified: Secondary | ICD-10-CM | POA: Diagnosis not present

## 2023-09-02 DIAGNOSIS — D509 Iron deficiency anemia, unspecified: Secondary | ICD-10-CM | POA: Diagnosis not present

## 2023-09-02 DIAGNOSIS — I10 Essential (primary) hypertension: Secondary | ICD-10-CM | POA: Diagnosis not present

## 2023-09-02 DIAGNOSIS — D631 Anemia in chronic kidney disease: Secondary | ICD-10-CM | POA: Diagnosis not present

## 2023-09-04 DIAGNOSIS — N186 End stage renal disease: Secondary | ICD-10-CM | POA: Diagnosis not present

## 2023-09-04 DIAGNOSIS — D631 Anemia in chronic kidney disease: Secondary | ICD-10-CM | POA: Diagnosis not present

## 2023-09-04 DIAGNOSIS — R58 Hemorrhage, not elsewhere classified: Secondary | ICD-10-CM | POA: Diagnosis not present

## 2023-09-04 DIAGNOSIS — I10 Essential (primary) hypertension: Secondary | ICD-10-CM | POA: Diagnosis not present

## 2023-09-04 DIAGNOSIS — N2581 Secondary hyperparathyroidism of renal origin: Secondary | ICD-10-CM | POA: Diagnosis not present

## 2023-09-04 DIAGNOSIS — D509 Iron deficiency anemia, unspecified: Secondary | ICD-10-CM | POA: Diagnosis not present

## 2023-09-06 DIAGNOSIS — R58 Hemorrhage, not elsewhere classified: Secondary | ICD-10-CM | POA: Diagnosis not present

## 2023-09-06 DIAGNOSIS — D509 Iron deficiency anemia, unspecified: Secondary | ICD-10-CM | POA: Diagnosis not present

## 2023-09-06 DIAGNOSIS — N2581 Secondary hyperparathyroidism of renal origin: Secondary | ICD-10-CM | POA: Diagnosis not present

## 2023-09-06 DIAGNOSIS — I10 Essential (primary) hypertension: Secondary | ICD-10-CM | POA: Diagnosis not present

## 2023-09-06 DIAGNOSIS — D631 Anemia in chronic kidney disease: Secondary | ICD-10-CM | POA: Diagnosis not present

## 2023-09-06 DIAGNOSIS — N186 End stage renal disease: Secondary | ICD-10-CM | POA: Diagnosis not present

## 2023-09-09 DIAGNOSIS — D509 Iron deficiency anemia, unspecified: Secondary | ICD-10-CM | POA: Diagnosis not present

## 2023-09-09 DIAGNOSIS — R58 Hemorrhage, not elsewhere classified: Secondary | ICD-10-CM | POA: Diagnosis not present

## 2023-09-09 DIAGNOSIS — N186 End stage renal disease: Secondary | ICD-10-CM | POA: Diagnosis not present

## 2023-09-09 DIAGNOSIS — Z992 Dependence on renal dialysis: Secondary | ICD-10-CM | POA: Diagnosis not present

## 2023-09-09 DIAGNOSIS — D631 Anemia in chronic kidney disease: Secondary | ICD-10-CM | POA: Diagnosis not present

## 2023-09-09 DIAGNOSIS — N2581 Secondary hyperparathyroidism of renal origin: Secondary | ICD-10-CM | POA: Diagnosis not present

## 2023-09-09 DIAGNOSIS — I10 Essential (primary) hypertension: Secondary | ICD-10-CM | POA: Diagnosis not present

## 2023-09-11 DIAGNOSIS — N2581 Secondary hyperparathyroidism of renal origin: Secondary | ICD-10-CM | POA: Diagnosis not present

## 2023-09-11 DIAGNOSIS — E785 Hyperlipidemia, unspecified: Secondary | ICD-10-CM | POA: Diagnosis not present

## 2023-09-11 DIAGNOSIS — D631 Anemia in chronic kidney disease: Secondary | ICD-10-CM | POA: Diagnosis not present

## 2023-09-11 DIAGNOSIS — E119 Type 2 diabetes mellitus without complications: Secondary | ICD-10-CM | POA: Diagnosis not present

## 2023-09-11 DIAGNOSIS — D509 Iron deficiency anemia, unspecified: Secondary | ICD-10-CM | POA: Diagnosis not present

## 2023-09-11 DIAGNOSIS — N186 End stage renal disease: Secondary | ICD-10-CM | POA: Diagnosis not present

## 2023-09-13 DIAGNOSIS — D509 Iron deficiency anemia, unspecified: Secondary | ICD-10-CM | POA: Diagnosis not present

## 2023-09-13 DIAGNOSIS — N186 End stage renal disease: Secondary | ICD-10-CM | POA: Diagnosis not present

## 2023-09-13 DIAGNOSIS — E785 Hyperlipidemia, unspecified: Secondary | ICD-10-CM | POA: Diagnosis not present

## 2023-09-13 DIAGNOSIS — D631 Anemia in chronic kidney disease: Secondary | ICD-10-CM | POA: Diagnosis not present

## 2023-09-13 DIAGNOSIS — N2581 Secondary hyperparathyroidism of renal origin: Secondary | ICD-10-CM | POA: Diagnosis not present

## 2023-09-16 DIAGNOSIS — N186 End stage renal disease: Secondary | ICD-10-CM | POA: Diagnosis not present

## 2023-09-16 DIAGNOSIS — D631 Anemia in chronic kidney disease: Secondary | ICD-10-CM | POA: Diagnosis not present

## 2023-09-16 DIAGNOSIS — N2581 Secondary hyperparathyroidism of renal origin: Secondary | ICD-10-CM | POA: Diagnosis not present

## 2023-09-16 DIAGNOSIS — E785 Hyperlipidemia, unspecified: Secondary | ICD-10-CM | POA: Diagnosis not present

## 2023-09-16 DIAGNOSIS — D509 Iron deficiency anemia, unspecified: Secondary | ICD-10-CM | POA: Diagnosis not present

## 2023-09-18 DIAGNOSIS — D509 Iron deficiency anemia, unspecified: Secondary | ICD-10-CM | POA: Diagnosis not present

## 2023-09-18 DIAGNOSIS — E785 Hyperlipidemia, unspecified: Secondary | ICD-10-CM | POA: Diagnosis not present

## 2023-09-18 DIAGNOSIS — N2581 Secondary hyperparathyroidism of renal origin: Secondary | ICD-10-CM | POA: Diagnosis not present

## 2023-09-18 DIAGNOSIS — N186 End stage renal disease: Secondary | ICD-10-CM | POA: Diagnosis not present

## 2023-09-18 DIAGNOSIS — D631 Anemia in chronic kidney disease: Secondary | ICD-10-CM | POA: Diagnosis not present

## 2023-09-20 DIAGNOSIS — E785 Hyperlipidemia, unspecified: Secondary | ICD-10-CM | POA: Diagnosis not present

## 2023-09-20 DIAGNOSIS — N2581 Secondary hyperparathyroidism of renal origin: Secondary | ICD-10-CM | POA: Diagnosis not present

## 2023-09-20 DIAGNOSIS — N186 End stage renal disease: Secondary | ICD-10-CM | POA: Diagnosis not present

## 2023-09-20 DIAGNOSIS — D509 Iron deficiency anemia, unspecified: Secondary | ICD-10-CM | POA: Diagnosis not present

## 2023-09-20 DIAGNOSIS — D631 Anemia in chronic kidney disease: Secondary | ICD-10-CM | POA: Diagnosis not present

## 2023-09-23 DIAGNOSIS — E785 Hyperlipidemia, unspecified: Secondary | ICD-10-CM | POA: Diagnosis not present

## 2023-09-23 DIAGNOSIS — D631 Anemia in chronic kidney disease: Secondary | ICD-10-CM | POA: Diagnosis not present

## 2023-09-23 DIAGNOSIS — N186 End stage renal disease: Secondary | ICD-10-CM | POA: Diagnosis not present

## 2023-09-23 DIAGNOSIS — D509 Iron deficiency anemia, unspecified: Secondary | ICD-10-CM | POA: Diagnosis not present

## 2023-09-23 DIAGNOSIS — N2581 Secondary hyperparathyroidism of renal origin: Secondary | ICD-10-CM | POA: Diagnosis not present

## 2023-09-24 DIAGNOSIS — H25812 Combined forms of age-related cataract, left eye: Secondary | ICD-10-CM | POA: Diagnosis not present

## 2023-09-24 DIAGNOSIS — Z961 Presence of intraocular lens: Secondary | ICD-10-CM | POA: Diagnosis not present

## 2023-09-24 DIAGNOSIS — E113593 Type 2 diabetes mellitus with proliferative diabetic retinopathy without macular edema, bilateral: Secondary | ICD-10-CM | POA: Diagnosis not present

## 2023-09-24 DIAGNOSIS — H31091 Other chorioretinal scars, right eye: Secondary | ICD-10-CM | POA: Diagnosis not present

## 2023-09-24 DIAGNOSIS — H401133 Primary open-angle glaucoma, bilateral, severe stage: Secondary | ICD-10-CM | POA: Diagnosis not present

## 2023-09-25 DIAGNOSIS — E785 Hyperlipidemia, unspecified: Secondary | ICD-10-CM | POA: Diagnosis not present

## 2023-09-25 DIAGNOSIS — D631 Anemia in chronic kidney disease: Secondary | ICD-10-CM | POA: Diagnosis not present

## 2023-09-25 DIAGNOSIS — N186 End stage renal disease: Secondary | ICD-10-CM | POA: Diagnosis not present

## 2023-09-25 DIAGNOSIS — D509 Iron deficiency anemia, unspecified: Secondary | ICD-10-CM | POA: Diagnosis not present

## 2023-09-25 DIAGNOSIS — N2581 Secondary hyperparathyroidism of renal origin: Secondary | ICD-10-CM | POA: Diagnosis not present

## 2023-09-27 DIAGNOSIS — E785 Hyperlipidemia, unspecified: Secondary | ICD-10-CM | POA: Diagnosis not present

## 2023-09-27 DIAGNOSIS — N2581 Secondary hyperparathyroidism of renal origin: Secondary | ICD-10-CM | POA: Diagnosis not present

## 2023-09-27 DIAGNOSIS — D631 Anemia in chronic kidney disease: Secondary | ICD-10-CM | POA: Diagnosis not present

## 2023-09-27 DIAGNOSIS — D509 Iron deficiency anemia, unspecified: Secondary | ICD-10-CM | POA: Diagnosis not present

## 2023-09-27 DIAGNOSIS — N186 End stage renal disease: Secondary | ICD-10-CM | POA: Diagnosis not present

## 2023-09-30 DIAGNOSIS — N2581 Secondary hyperparathyroidism of renal origin: Secondary | ICD-10-CM | POA: Diagnosis not present

## 2023-09-30 DIAGNOSIS — D631 Anemia in chronic kidney disease: Secondary | ICD-10-CM | POA: Diagnosis not present

## 2023-09-30 DIAGNOSIS — D509 Iron deficiency anemia, unspecified: Secondary | ICD-10-CM | POA: Diagnosis not present

## 2023-09-30 DIAGNOSIS — N186 End stage renal disease: Secondary | ICD-10-CM | POA: Diagnosis not present

## 2023-09-30 DIAGNOSIS — E785 Hyperlipidemia, unspecified: Secondary | ICD-10-CM | POA: Diagnosis not present

## 2023-10-02 DIAGNOSIS — E785 Hyperlipidemia, unspecified: Secondary | ICD-10-CM | POA: Diagnosis not present

## 2023-10-02 DIAGNOSIS — D631 Anemia in chronic kidney disease: Secondary | ICD-10-CM | POA: Diagnosis not present

## 2023-10-02 DIAGNOSIS — N186 End stage renal disease: Secondary | ICD-10-CM | POA: Diagnosis not present

## 2023-10-02 DIAGNOSIS — N2581 Secondary hyperparathyroidism of renal origin: Secondary | ICD-10-CM | POA: Diagnosis not present

## 2023-10-02 DIAGNOSIS — D509 Iron deficiency anemia, unspecified: Secondary | ICD-10-CM | POA: Diagnosis not present

## 2023-10-04 DIAGNOSIS — E785 Hyperlipidemia, unspecified: Secondary | ICD-10-CM | POA: Diagnosis not present

## 2023-10-04 DIAGNOSIS — D509 Iron deficiency anemia, unspecified: Secondary | ICD-10-CM | POA: Diagnosis not present

## 2023-10-04 DIAGNOSIS — D631 Anemia in chronic kidney disease: Secondary | ICD-10-CM | POA: Diagnosis not present

## 2023-10-04 DIAGNOSIS — N2581 Secondary hyperparathyroidism of renal origin: Secondary | ICD-10-CM | POA: Diagnosis not present

## 2023-10-04 DIAGNOSIS — N186 End stage renal disease: Secondary | ICD-10-CM | POA: Diagnosis not present

## 2023-10-07 DIAGNOSIS — E785 Hyperlipidemia, unspecified: Secondary | ICD-10-CM | POA: Diagnosis not present

## 2023-10-07 DIAGNOSIS — D631 Anemia in chronic kidney disease: Secondary | ICD-10-CM | POA: Diagnosis not present

## 2023-10-07 DIAGNOSIS — N186 End stage renal disease: Secondary | ICD-10-CM | POA: Diagnosis not present

## 2023-10-07 DIAGNOSIS — D509 Iron deficiency anemia, unspecified: Secondary | ICD-10-CM | POA: Diagnosis not present

## 2023-10-07 DIAGNOSIS — N2581 Secondary hyperparathyroidism of renal origin: Secondary | ICD-10-CM | POA: Diagnosis not present

## 2023-10-09 DIAGNOSIS — N2581 Secondary hyperparathyroidism of renal origin: Secondary | ICD-10-CM | POA: Diagnosis not present

## 2023-10-09 DIAGNOSIS — D509 Iron deficiency anemia, unspecified: Secondary | ICD-10-CM | POA: Diagnosis not present

## 2023-10-09 DIAGNOSIS — E785 Hyperlipidemia, unspecified: Secondary | ICD-10-CM | POA: Diagnosis not present

## 2023-10-09 DIAGNOSIS — Z992 Dependence on renal dialysis: Secondary | ICD-10-CM | POA: Diagnosis not present

## 2023-10-09 DIAGNOSIS — D631 Anemia in chronic kidney disease: Secondary | ICD-10-CM | POA: Diagnosis not present

## 2023-10-09 DIAGNOSIS — N186 End stage renal disease: Secondary | ICD-10-CM | POA: Diagnosis not present

## 2023-10-11 DIAGNOSIS — D509 Iron deficiency anemia, unspecified: Secondary | ICD-10-CM | POA: Diagnosis not present

## 2023-10-11 DIAGNOSIS — N186 End stage renal disease: Secondary | ICD-10-CM | POA: Diagnosis not present

## 2023-10-11 DIAGNOSIS — D631 Anemia in chronic kidney disease: Secondary | ICD-10-CM | POA: Diagnosis not present

## 2023-10-11 DIAGNOSIS — N2581 Secondary hyperparathyroidism of renal origin: Secondary | ICD-10-CM | POA: Diagnosis not present

## 2023-10-14 DIAGNOSIS — N186 End stage renal disease: Secondary | ICD-10-CM | POA: Diagnosis not present

## 2023-10-14 DIAGNOSIS — D631 Anemia in chronic kidney disease: Secondary | ICD-10-CM | POA: Diagnosis not present

## 2023-10-14 DIAGNOSIS — N2581 Secondary hyperparathyroidism of renal origin: Secondary | ICD-10-CM | POA: Diagnosis not present

## 2023-10-14 DIAGNOSIS — D509 Iron deficiency anemia, unspecified: Secondary | ICD-10-CM | POA: Diagnosis not present

## 2023-10-16 DIAGNOSIS — N2581 Secondary hyperparathyroidism of renal origin: Secondary | ICD-10-CM | POA: Diagnosis not present

## 2023-10-16 DIAGNOSIS — N186 End stage renal disease: Secondary | ICD-10-CM | POA: Diagnosis not present

## 2023-10-16 DIAGNOSIS — D631 Anemia in chronic kidney disease: Secondary | ICD-10-CM | POA: Diagnosis not present

## 2023-10-16 DIAGNOSIS — D509 Iron deficiency anemia, unspecified: Secondary | ICD-10-CM | POA: Diagnosis not present

## 2023-10-16 DIAGNOSIS — E119 Type 2 diabetes mellitus without complications: Secondary | ICD-10-CM | POA: Diagnosis not present

## 2023-10-18 DIAGNOSIS — N186 End stage renal disease: Secondary | ICD-10-CM | POA: Diagnosis not present

## 2023-10-18 DIAGNOSIS — D631 Anemia in chronic kidney disease: Secondary | ICD-10-CM | POA: Diagnosis not present

## 2023-10-18 DIAGNOSIS — N2581 Secondary hyperparathyroidism of renal origin: Secondary | ICD-10-CM | POA: Diagnosis not present

## 2023-10-18 DIAGNOSIS — D509 Iron deficiency anemia, unspecified: Secondary | ICD-10-CM | POA: Diagnosis not present

## 2023-10-21 DIAGNOSIS — N2581 Secondary hyperparathyroidism of renal origin: Secondary | ICD-10-CM | POA: Diagnosis not present

## 2023-10-21 DIAGNOSIS — D631 Anemia in chronic kidney disease: Secondary | ICD-10-CM | POA: Diagnosis not present

## 2023-10-21 DIAGNOSIS — D509 Iron deficiency anemia, unspecified: Secondary | ICD-10-CM | POA: Diagnosis not present

## 2023-10-21 DIAGNOSIS — N186 End stage renal disease: Secondary | ICD-10-CM | POA: Diagnosis not present

## 2023-10-23 DIAGNOSIS — N2581 Secondary hyperparathyroidism of renal origin: Secondary | ICD-10-CM | POA: Diagnosis not present

## 2023-10-23 DIAGNOSIS — N186 End stage renal disease: Secondary | ICD-10-CM | POA: Diagnosis not present

## 2023-10-23 DIAGNOSIS — D509 Iron deficiency anemia, unspecified: Secondary | ICD-10-CM | POA: Diagnosis not present

## 2023-10-23 DIAGNOSIS — D631 Anemia in chronic kidney disease: Secondary | ICD-10-CM | POA: Diagnosis not present

## 2023-10-25 DIAGNOSIS — D631 Anemia in chronic kidney disease: Secondary | ICD-10-CM | POA: Diagnosis not present

## 2023-10-25 DIAGNOSIS — N2581 Secondary hyperparathyroidism of renal origin: Secondary | ICD-10-CM | POA: Diagnosis not present

## 2023-10-25 DIAGNOSIS — D509 Iron deficiency anemia, unspecified: Secondary | ICD-10-CM | POA: Diagnosis not present

## 2023-10-25 DIAGNOSIS — N186 End stage renal disease: Secondary | ICD-10-CM | POA: Diagnosis not present

## 2023-10-28 DIAGNOSIS — D509 Iron deficiency anemia, unspecified: Secondary | ICD-10-CM | POA: Diagnosis not present

## 2023-10-28 DIAGNOSIS — D631 Anemia in chronic kidney disease: Secondary | ICD-10-CM | POA: Diagnosis not present

## 2023-10-28 DIAGNOSIS — N2581 Secondary hyperparathyroidism of renal origin: Secondary | ICD-10-CM | POA: Diagnosis not present

## 2023-10-28 DIAGNOSIS — N186 End stage renal disease: Secondary | ICD-10-CM | POA: Diagnosis not present

## 2023-10-30 DIAGNOSIS — N2581 Secondary hyperparathyroidism of renal origin: Secondary | ICD-10-CM | POA: Diagnosis not present

## 2023-10-30 DIAGNOSIS — D631 Anemia in chronic kidney disease: Secondary | ICD-10-CM | POA: Diagnosis not present

## 2023-10-30 DIAGNOSIS — N186 End stage renal disease: Secondary | ICD-10-CM | POA: Diagnosis not present

## 2023-10-30 DIAGNOSIS — D509 Iron deficiency anemia, unspecified: Secondary | ICD-10-CM | POA: Diagnosis not present

## 2023-11-01 DIAGNOSIS — D509 Iron deficiency anemia, unspecified: Secondary | ICD-10-CM | POA: Diagnosis not present

## 2023-11-01 DIAGNOSIS — D631 Anemia in chronic kidney disease: Secondary | ICD-10-CM | POA: Diagnosis not present

## 2023-11-01 DIAGNOSIS — N186 End stage renal disease: Secondary | ICD-10-CM | POA: Diagnosis not present

## 2023-11-01 DIAGNOSIS — N2581 Secondary hyperparathyroidism of renal origin: Secondary | ICD-10-CM | POA: Diagnosis not present

## 2023-11-04 DIAGNOSIS — N2581 Secondary hyperparathyroidism of renal origin: Secondary | ICD-10-CM | POA: Diagnosis not present

## 2023-11-04 DIAGNOSIS — D631 Anemia in chronic kidney disease: Secondary | ICD-10-CM | POA: Diagnosis not present

## 2023-11-04 DIAGNOSIS — D509 Iron deficiency anemia, unspecified: Secondary | ICD-10-CM | POA: Diagnosis not present

## 2023-11-04 DIAGNOSIS — N186 End stage renal disease: Secondary | ICD-10-CM | POA: Diagnosis not present

## 2023-11-06 DIAGNOSIS — N186 End stage renal disease: Secondary | ICD-10-CM | POA: Diagnosis not present

## 2023-11-06 DIAGNOSIS — D509 Iron deficiency anemia, unspecified: Secondary | ICD-10-CM | POA: Diagnosis not present

## 2023-11-06 DIAGNOSIS — D631 Anemia in chronic kidney disease: Secondary | ICD-10-CM | POA: Diagnosis not present

## 2023-11-06 DIAGNOSIS — N2581 Secondary hyperparathyroidism of renal origin: Secondary | ICD-10-CM | POA: Diagnosis not present

## 2023-11-08 DIAGNOSIS — D509 Iron deficiency anemia, unspecified: Secondary | ICD-10-CM | POA: Diagnosis not present

## 2023-11-08 DIAGNOSIS — N186 End stage renal disease: Secondary | ICD-10-CM | POA: Diagnosis not present

## 2023-11-08 DIAGNOSIS — N2581 Secondary hyperparathyroidism of renal origin: Secondary | ICD-10-CM | POA: Diagnosis not present

## 2023-11-08 DIAGNOSIS — D631 Anemia in chronic kidney disease: Secondary | ICD-10-CM | POA: Diagnosis not present

## 2023-11-09 DIAGNOSIS — N186 End stage renal disease: Secondary | ICD-10-CM | POA: Diagnosis not present

## 2023-11-09 DIAGNOSIS — Z992 Dependence on renal dialysis: Secondary | ICD-10-CM | POA: Diagnosis not present

## 2023-11-19 ENCOUNTER — Encounter: Payer: Self-pay | Admitting: *Deleted

## 2023-11-26 NOTE — Progress Notes (Signed)
------------------------------    BASIC NOTE  ------------------------------  Patient: Jeffrey Zuniga  DOB: 01-07-1956  MRN: [id number]  Note Author: ZEKAN, JEANNE M, MD  Service Date: 11/25/2023  Telehealth encounter using audiovisual technology, performed according to state requirements.  This patient was seen for a basic visit as part of routine monthly dialysis care for end stage  renal disease. Documentation of the visit is present in the dialysis unit medical record system.  Attending Nephrologist: ZEKAN, JEANNE M  Dialysis Location: [facility]  Schedule: M-W-F  Shift: 2  ------------------------------  ADDITIONAL COMMENT  ------------------------------  COMMENTS: Patient seen via telehealth and is doing well. He has no new complaints.    Signed by: ZEKAN, JEANNE M, MD on 11/26/2023 at 10:17:29 PM  Transcribed by: ZEKAN, JEANNE M, MD on 11/26/2023 at 10:17:29 PM

## 2023-12-07 NOTE — Progress Notes (Signed)
 Patient: Jeffrey Zuniga  DOB: 06-12-1955  MRN: [id number]  Note Type: Dialysis Rounds-Comp  Service Date: 11/18/2023  This patient was personally seen for a complete visit as part of routine monthly dialysis care for  end stage renal disease.  Attending Nephrologist: ZEKAN, JEANNE M  Dialysis Location: [facility]  Schedule: M-W-F  Shift: 2  ------------------------------  OVERVIEW  ------------------------------  COMMENTS: Hemodialysis is ongoing and well tolerated. Please see Time V7.  ------------------------------  ADDITIONAL COMMENT  ------------------------------  COMMENTS: Patient seen via telehealth and is doing well. He has no new complaints.    Signed by: ADEGOROYE, ADEYINKA A, MD on 12/07/2023 at 11:08:01 AM  Transcribed by: MICAELA ACIE LABOR, MD on 12/07/2023 at 11:08:01 AM

## 2024-01-01 NOTE — Progress Notes (Signed)
------------------------------   BASIC NOTE ------------------------------  Patient: Jeffrey Zuniga DOB: 10-22-1955 MRN: [id number] Note Author: ZEKAN, JEANNE M, MD Service Date: 01/01/2024  This patient was seen for a basic visit as part of routine monthly dialysis care for end stage renal disease. Documentation of the visit is present in the dialysis unit medical record system.  Attending Nephrologist: ZEKAN, JEANNE M Dialysis Location: [facility] Schedule: M-W-F Shift: 2  ------------------------------ ADDITIONAL COMMENT ------------------------------  COMMENTS: Patient seen at dialysis and is doing well. BP are elevated - he will bring his meds in Friday   Signed by: ZEKAN, JEANNE M, MD on 01/01/2024 at 04:40:51 PM Transcribed by: ZEKAN, JEANNE M, MD on 01/01/2024 at 04:40:51 PM

## 2024-01-03 NOTE — Progress Notes (Signed)
 Patient: Artemus Romanoff DOB: 05-Jan-1956 MRN: [id number] Note Type: Dialysis Rounds-Comp Attestation Service Date: 12/23/2023  This patient was seen face-to-face for a complete visit as part of routine monthly dialysis care for end stage renal disease. Documentation of the visit is present in the dialysis unit or other medical record system.  Attending Nephrologist: ZEKAN, JEANNE M Dialysis Location: [facility] Schedule: M-W-F Shift: 2  ------------------------------ ADDITIONAL COMMENT ------------------------------  COMMENTS: Patient seen at dialysis and is doing well. Labs reviewed with patient, and no changes made. please see time V7 note.   Signed by: ZEKAN, JEANNE M, MD on 01/03/2024 at 07:15:01 PM Transcribed by: ZEKAN, JEANNE M, MD on 01/03/2024 at 07:15:01 PM

## 2024-01-06 NOTE — Progress Notes (Signed)
------------------------------   BASIC NOTE ------------------------------  Patient: Jeffrey Zuniga DOB: 03/01/1956 MRN: [id number] Note Author: MICAELA ACIE LABOR, MD Service Date: 12/11/2023  This patient was personally seen face-to-face for a basic visit as part of routine monthly dialysis care for end stage renal disease.  Attending Nephrologist: ZEKAN, JEANNE M Dialysis Location: [facility] Schedule: M-W-F Shift: 2  ------------------------------ OVERVIEW ------------------------------  COMMENTS: Hemodialysis is ongoing and well tolerated. BP is elevated - need to recheck at next treatment.  ------------------------------ ADDITIONAL COMMENT ------------------------------  COMMENTS: Patient seen at dialysis and is doing well. BP are elevated - he will bring his meds in Friday   Signed by: MICAELA ACIE LABOR, MD on 01/06/2024 at 07:06:39 PM Transcribed by: MICAELA ACIE LABOR, MD on 01/06/2024 at 07:06:39 PM

## 2024-01-08 NOTE — Progress Notes (Signed)
------------------------------   BASIC NOTE ------------------------------  Patient: Jeffrey Zuniga DOB: 1956-01-23 MRN: [id number] Note Author: MICAELA ACIE LABOR, MD Service Date: 12/30/2023  This patient was personally seen face-to-face for a basic visit as part of routine monthly dialysis care for end stage renal disease.  Attending Nephrologist: ZEKAN, JEANNE M Dialysis Location: [facility] Schedule: M-W-F Shift: 2  ------------------------------ OVERVIEW ------------------------------  COMMENTS: Hemodialysis is ongoing and well tolerated.  ------------------------------ ADDITIONAL COMMENT ------------------------------  COMMENTS: Patient seen at dialysis and is doing well. BP are elevated - he will bring his meds in Friday   Signed by: MICAELA ACIE LABOR, MD on 01/08/2024 at 05:48:03 PM Transcribed by: MICAELA ACIE LABOR, MD on 01/08/2024 at 05:48:03 PM

## 2024-01-13 ENCOUNTER — Emergency Department (HOSPITAL_COMMUNITY)

## 2024-01-13 ENCOUNTER — Emergency Department (HOSPITAL_COMMUNITY)
Admission: EM | Admit: 2024-01-13 | Discharge: 2024-01-13 | Disposition: A | Discharge status: Home / Self Care | Attending: Emergency Medicine | Admitting: Emergency Medicine

## 2024-01-13 ENCOUNTER — Encounter (HOSPITAL_COMMUNITY): Payer: Self-pay

## 2024-01-13 DIAGNOSIS — B349 Viral infection, unspecified: Secondary | ICD-10-CM | POA: Diagnosis not present

## 2024-01-13 DIAGNOSIS — R197 Diarrhea, unspecified: Secondary | ICD-10-CM | POA: Diagnosis not present

## 2024-01-13 DIAGNOSIS — N186 End stage renal disease: Secondary | ICD-10-CM | POA: Insufficient documentation

## 2024-01-13 DIAGNOSIS — B182 Chronic viral hepatitis C: Secondary | ICD-10-CM | POA: Diagnosis not present

## 2024-01-13 DIAGNOSIS — R6883 Chills (without fever): Secondary | ICD-10-CM | POA: Insufficient documentation

## 2024-01-13 DIAGNOSIS — I509 Heart failure, unspecified: Secondary | ICD-10-CM | POA: Diagnosis not present

## 2024-01-13 DIAGNOSIS — R110 Nausea: Secondary | ICD-10-CM | POA: Diagnosis not present

## 2024-01-13 DIAGNOSIS — Z992 Dependence on renal dialysis: Secondary | ICD-10-CM | POA: Insufficient documentation

## 2024-01-13 DIAGNOSIS — R6889 Other general symptoms and signs: Secondary | ICD-10-CM

## 2024-01-13 DIAGNOSIS — I132 Hypertensive heart and chronic kidney disease with heart failure and with stage 5 chronic kidney disease, or end stage renal disease: Secondary | ICD-10-CM | POA: Diagnosis not present

## 2024-01-13 DIAGNOSIS — E875 Hyperkalemia: Secondary | ICD-10-CM | POA: Diagnosis not present

## 2024-01-13 DIAGNOSIS — D631 Anemia in chronic kidney disease: Secondary | ICD-10-CM | POA: Diagnosis not present

## 2024-01-13 DIAGNOSIS — D649 Anemia, unspecified: Secondary | ICD-10-CM | POA: Insufficient documentation

## 2024-01-13 LAB — COMPREHENSIVE METABOLIC PANEL WITH GFR
ALT: 34 U/L (ref 0–44)
AST: 27 U/L (ref 15–41)
Albumin: 3.8 g/dL (ref 3.5–5.0)
Alkaline Phosphatase: 88 U/L (ref 38–126)
Anion gap: 14 (ref 5–15)
BUN: 51 mg/dL — ABNORMAL HIGH (ref 8–23)
CO2: 22 mmol/L (ref 22–32)
Calcium: 8.7 mg/dL — ABNORMAL LOW (ref 8.9–10.3)
Chloride: 100 mmol/L (ref 98–111)
Creatinine, Ser: 11.65 mg/dL — ABNORMAL HIGH (ref 0.61–1.24)
GFR, Estimated: 4 mL/min — ABNORMAL LOW (ref >60)
Glucose, Bld: 96 mg/dL (ref 70–99)
Potassium: 6.6 mmol/L (ref 3.5–5.1)
Sodium: 136 mmol/L (ref 135–145)
Total Bilirubin: 0.8 mg/dL (ref 0.0–1.2)
Total Protein: 7.9 g/dL (ref 6.5–8.1)

## 2024-01-13 LAB — URINALYSIS, ROUTINE W REFLEX MICROSCOPIC
Bilirubin Urine: NEGATIVE
Glucose, UA: NEGATIVE mg/dL
Hgb urine dipstick: NEGATIVE
Ketones, ur: NEGATIVE mg/dL
Leukocytes,Ua: NEGATIVE
Nitrite: NEGATIVE
Protein, ur: 100 mg/dL — AB
Specific Gravity, Urine: 1.011 (ref 1.005–1.030)
pH: 8 (ref 5.0–8.0)

## 2024-01-13 LAB — CBC
HCT: 36 % — ABNORMAL LOW (ref 39.0–52.0)
HCT: 35 % — ABNORMAL LOW (ref 39.0–52.0)
Hemoglobin: 11.4 g/dL — ABNORMAL LOW (ref 13.0–17.0)
Hemoglobin: 11.1 g/dL — ABNORMAL LOW (ref 13.0–17.0)
MCH: 31.1 pg (ref 26.0–34.0)
MCH: 31.2 pg (ref 26.0–34.0)
MCHC: 31.7 g/dL (ref 30.0–36.0)
MCHC: 31.7 g/dL (ref 30.0–36.0)
MCV: 98.1 fL (ref 80.0–100.0)
MCV: 98.3 fL (ref 80.0–100.0)
Platelets: 150 K/uL (ref 150–400)
Platelets: 148 K/uL — ABNORMAL LOW (ref 150–400)
RBC: 3.67 MIL/uL — ABNORMAL LOW (ref 4.22–5.81)
RBC: 3.56 MIL/uL — ABNORMAL LOW (ref 4.22–5.81)
RDW: 16.2 % — ABNORMAL HIGH (ref 11.5–15.5)
RDW: 16 % — ABNORMAL HIGH (ref 11.5–15.5)
WBC: 5.9 K/uL (ref 4.0–10.5)
WBC: 5.8 K/uL (ref 4.0–10.5)
nRBC: 0 % (ref 0.0–0.2)
nRBC: 0 % (ref 0.0–0.2)

## 2024-01-13 LAB — RENAL FUNCTION PANEL
Albumin: 3.6 g/dL (ref 3.5–5.0)
Anion gap: 12 (ref 5–15)
BUN: 52 mg/dL — ABNORMAL HIGH (ref 8–23)
CO2: 23 mmol/L (ref 22–32)
Calcium: 8.7 mg/dL — ABNORMAL LOW (ref 8.9–10.3)
Chloride: 101 mmol/L (ref 98–111)
Creatinine, Ser: 11.58 mg/dL — ABNORMAL HIGH (ref 0.61–1.24)
GFR, Estimated: 4 mL/min — ABNORMAL LOW (ref >60)
Glucose, Bld: 83 mg/dL (ref 70–99)
Phosphorus: 5.3 mg/dL — ABNORMAL HIGH (ref 2.5–4.6)
Potassium: 6.6 mmol/L (ref 3.5–5.1)
Sodium: 136 mmol/L (ref 135–145)

## 2024-01-13 LAB — RESP PANEL BY RT-PCR (RSV, FLU A&B, COVID)  RVPGX2
Influenza A by PCR: NEGATIVE
Influenza B by PCR: NEGATIVE
Resp Syncytial Virus by PCR: NEGATIVE
SARS Coronavirus 2 by RT PCR: NEGATIVE

## 2024-01-13 LAB — HEPATITIS B SURFACE ANTIGEN: Hepatitis B Surface Ag: NONREACTIVE

## 2024-01-13 LAB — TROPONIN I (HIGH SENSITIVITY)
Troponin I (High Sensitivity): 15 ng/L (ref <18)
Troponin I (High Sensitivity): 14 ng/L (ref <18)

## 2024-01-13 LAB — BRAIN NATRIURETIC PEPTIDE: B Natriuretic Peptide: 333.7 pg/mL — ABNORMAL HIGH (ref 0.0–100.0)

## 2024-01-13 LAB — LIPASE, BLOOD: Lipase: 45 U/L (ref 11–51)

## 2024-01-13 MED ORDER — HEPARIN SODIUM (PORCINE) 1000 UNIT/ML DIALYSIS
1000.0000 [IU] | INTRAMUSCULAR | Status: DC | PRN
Start: 2024-01-13 — End: 2024-01-13

## 2024-01-13 MED ORDER — ACETAMINOPHEN 500 MG PO TABS
1000.0000 mg | ORAL_TABLET | Freq: Once | ORAL | Status: AC
  Administered 2024-01-13: 1000 mg via ORAL
  Filled 2024-01-13: qty 2

## 2024-01-13 MED ORDER — LIDOCAINE HCL (PF) 1 % IJ SOLN: 5.0000 mL | INTRAMUSCULAR | Status: DC | PRN

## 2024-01-13 MED ORDER — LIDOCAINE-PRILOCAINE 2.5-2.5 % EX CREA: 1.0000 | TOPICAL_CREAM | CUTANEOUS | Status: DC | PRN

## 2024-01-13 MED ORDER — SODIUM BICARBONATE 8.4 % IV SOLN
50.0000 meq | Freq: Once | INTRAVENOUS | Status: AC
  Administered 2024-01-13: 50 meq via INTRAVENOUS
  Filled 2024-01-13: qty 50

## 2024-01-13 MED ORDER — CALCIUM GLUCONATE-NACL 1-0.675 GM/50ML-% IV SOLN
1.0000 g | Freq: Once | INTRAVENOUS | Status: AC
  Administered 2024-01-13: 1000 mg via INTRAVENOUS
  Filled 2024-01-13: qty 50

## 2024-01-13 MED ORDER — ANTICOAGULANT SODIUM CITRATE 4% (200MG/5ML) IV SOLN: 5.0000 mL | Status: DC | PRN

## 2024-01-13 MED ORDER — SODIUM ZIRCONIUM CYCLOSILICATE 10 G PO PACK
10.0000 g | PACK | Freq: Once | ORAL | Status: AC
  Administered 2024-01-13: 10 g via ORAL
  Filled 2024-01-13: qty 1

## 2024-01-13 MED ORDER — PENTAFLUOROPROP-TETRAFLUOROETH EX AERO
1.0000 | INHALATION_SPRAY | CUTANEOUS | Status: DC | PRN
Start: 2024-01-13 — End: 2024-01-13

## 2024-01-13 MED ORDER — ALTEPLASE 2 MG IJ SOLR: 2.0000 mg | Freq: Once | INTRAMUSCULAR | Status: DC | PRN

## 2024-01-13 MED ORDER — CHLORHEXIDINE GLUCONATE CLOTH 2 % EX PADS: 6.0000 | MEDICATED_PAD | Freq: Every day | CUTANEOUS | Status: DC

## 2024-01-13 NOTE — ED Triage Notes (Signed)
 Pt reports feeling chills x4 days starting after last dialysis treatment, /D x1 episode, /N starting today.  Pt does dialysis M,W,Fr,  last session Friday.

## 2024-01-13 NOTE — ED Provider Notes (Signed)
 Physical Exam  BP (!) 180/121   Pulse 73   Temp 98.1 F (36.7 C) (Oral)   Resp (!) 21   Ht 5' 7 (1.702 m)   Wt 77.7 kg   SpO2 99%   BMI 26.83 kg/m   Physical Exam Vitals and nursing note reviewed.  Constitutional:      General: He is not in acute distress.    Appearance: Normal appearance. He is not ill-appearing or diaphoretic.  HENT:     Head: Normocephalic and atraumatic.  Eyes:     General: No scleral icterus.       Right eye: No discharge.        Left eye: No discharge.     Extraocular Movements: Extraocular movements intact.     Conjunctiva/sclera: Conjunctivae normal.  Cardiovascular:     Rate and Rhythm: Normal rate and regular rhythm.     Pulses: Normal pulses.     Heart sounds: Normal heart sounds. No murmur heard.    No friction rub. No gallop.  Pulmonary:     Effort: Pulmonary effort is normal. No respiratory distress.  Abdominal:     General: Abdomen is flat. There is no distension.     Palpations: Abdomen is soft.     Tenderness: There is no abdominal tenderness. There is no guarding or rebound.  Musculoskeletal:        General: No swelling or deformity.     Cervical back: Normal range of motion. No rigidity.  Skin:    General: Skin is warm and dry.     Findings: No bruising, erythema or lesion.  Neurological:     General: No focal deficit present.     Mental Status: He is alert and oriented to person, place, and time. Mental status is at baseline.     Sensory: No sensory deficit.     Motor: No weakness.  Psychiatric:        Mood and Affect: Mood normal.     Procedures  Procedures  ED Course / MDM   Clinical Course as of 01/13/24 1348  Mon Jan 13, 2024  9571 Consult to nephrology Dr. Dolan, who is agreeable to dialyzing this patient this morning, he will present to the bedside to see the patient.  I appreciate collaboration care of the patient. [RS]  9365 Dialysis patient with URI. Dialyzing at 7a.  [CB]    Clinical Course User  Index [CB] Jeffrey Zuniga S, PA-C [RS] Sponseller, Pleasant SAUNDERS, PA-C   Medical Decision Making Amount and/or Complexity of Data Reviewed Labs: ordered. Radiology: ordered.  Risk OTC drugs. Prescription drug management.   Patient care was assumed after patient was dialyzed earlier today, known to have hyperkalemia as well as flulike symptoms.  After dialysis, patient was noted to have been feeling a whole lot better.  He wants to go home at this time.  His blood pressure is significantly elevated at this time.  However patient states that he has not taken his blood pressure medication and does not want to take any medications here in the emergency department.  I recommended that due to his blood pressure medications that he takes some here however he says that he feels really well and does not want take any medication here.  Believe is reasonable for him to go home and take his medication at this time with him experiencing no symptoms.  Will have him continue to pursue regular dialysis and follow-up with his PCP.  Patient vital signs have remained  stable throughout the course of patient's time in the ED. Low suspicion for any other emergent pathology at this time. I believe this patient is safe to be discharged. Provided strict return to ER precautions. Patient expressed agreement and understanding of plan. All questions were answered.      Beola Terrall RAMAN, PA-C 01/13/24 1425    Patsey Lot, MD 01/13/24 1538

## 2024-01-13 NOTE — ED Provider Notes (Signed)
 [facility] EMERGENCY DEPARTMENT AT [facility] Provider Note   CSN: [id number] Arrival date & time: 01/13/24  0221     Patient presents with: Chills   Jeffrey Zuniga is a 68 y.o. male with history of ESRD on HD Monday Wednesday Friday with last session Friday, admitted with concern for chills, body aches, nausea and diarrhea.  Constitutional symptoms started earlier in the week, nausea and diarrhea started last 24 hours, denies melena or hematochezia.  No syncopal episodes, no chest pain or shortness of breath.  No known contacts, dialysis 3 times per week.  In addition to both history of history of heart failure with EF of 40%, chronic hep C , central retinal vein occlusion.    HPI     Prior to Admission medications   Medication Sig Start Date End Date Taking? Authorizing Provider  amLODipine  (NORVASC ) 10 MG tablet TAKE 1 TABLET BY MOUTH ONCE DAILY . APPOINTMENT REQUIRED FOR FUTURE REFILLS 07/05/22   Marlee Lynwood NOVAK, MD  calcium  acetate (PHOSLO ) 667 MG capsule Take 1,334 mg by mouth 3 (three) times daily. 05/23/21   [provider]  Darbepoetin Alfa  (ARANESP ) 200 MCG/0.4ML SOSY injection Inject 0.4 mLs (200 mcg total) into the vein every Monday with hemodialysis. 03/13/21   Demaio, Alexa, MD  ferrous sulfate  325 (65 FE) MG tablet Take 1 tablet (325 mg total) by mouth daily with breakfast. 06/02/21   Cresenzo, John V, MD  hydrALAZINE  (APRESOLINE ) 25 MG tablet Take 25 mg by mouth 3 (three) times daily. 04/26/21   [provider]  isosorbide  mononitrate (IMDUR ) 60 MG 24 hr tablet Take 1 tablet by mouth once daily 04/12/23   Sanford, James B, MD  latanoprost  (XALATAN ) 0.005 % ophthalmic solution Place 1 drop into the left eye at bedtime. 03/06/21   Addie Perkins, DO  pantoprazole  (PROTONIX ) 40 MG tablet Take 1 tablet (40 mg total) by mouth daily. 03/28/21   Marlee Lynwood NOVAK, MD  prednisoLONE  acetate (PRED FORTE ) 1 % ophthalmic suspension Place 1 drop into the right eye  4 (four) times daily.    [provider]  Sofosbuvir -Velpatasvir  (EPCLUSA ) 400-100 MG TABS Take 1 tablet by mouth daily. 03/09/21   Kuppelweiser, Cassie L, RPH-CPP    Allergies: Patient has no known allergies.    Review of Systems  Constitutional:  Positive for appetite change, chills, diaphoresis and fatigue. Negative for fever.  Respiratory: Negative.    Cardiovascular: Negative.   Gastrointestinal:  Positive for diarrhea and nausea.  Musculoskeletal:  Positive for myalgias.    Updated Vital Signs BP (!) 166/73   Pulse 74   Temp 98.1 F (36.7 C) (Oral)   Resp 18   Ht 5' 7 (1.702 m)   Wt 79.8 kg   SpO2 100%   BMI 27.57 kg/m   Physical Exam Vitals and nursing note reviewed.  Constitutional:      Appearance: He is not ill-appearing or toxic-appearing.  HENT:     Head: Normocephalic and atraumatic.     Mouth/Throat:     Mouth: Mucous membranes are moist.     Pharynx: Oropharynx is clear. Uvula midline. No oropharyngeal exudate or posterior oropharyngeal erythema.     Tonsils: No tonsillar exudate.  Eyes:     General:        Right eye: No discharge.        Left eye: No discharge.     Conjunctiva/sclera: Conjunctivae normal.  Cardiovascular:     Rate and Rhythm: Normal rate and regular  rhythm.     Pulses: Normal pulses.     Heart sounds: Murmur heard.  Pulmonary:     Effort: Pulmonary effort is normal. No respiratory distress.     Breath sounds: Normal breath sounds. No wheezing or rales.  Abdominal:     General: Bowel sounds are normal. There is no distension.     Palpations: Abdomen is soft.     Tenderness: There is no abdominal tenderness. There is no guarding or rebound.  Musculoskeletal:        General: No deformity.     Cervical back: Neck supple.     Right lower leg: No edema.     Left lower leg: No edema.  Skin:    General: Skin is warm and dry.     Capillary Refill: Capillary refill takes less than 2 seconds.  Neurological:     General: No  focal deficit present.     Mental Status: He is alert and oriented to person, place, and time. Mental status is at baseline.  Psychiatric:        Mood and Affect: Mood normal.     (all labs ordered are listed, but only abnormal results are displayed) Labs Reviewed  COMPREHENSIVE METABOLIC PANEL WITH GFR - Abnormal; Notable for the following components:      Result Value   Potassium 6.6 (*)    BUN 51 (*)    Creatinine, Ser 11.65 (*)    Calcium  8.7 (*)    GFR, Estimated 4 (*)    All other components within normal limits  CBC - Abnormal; Notable for the following components:   RBC 3.67 (*)    Hemoglobin 11.4 (*)    HCT 36.0 (*)    RDW 16.2 (*)    All other components within normal limits  BRAIN NATRIURETIC PEPTIDE - Abnormal; Notable for the following components:   B Natriuretic Peptide 333.7 (*)    All other components within normal limits  RESP PANEL BY RT-PCR (RSV, FLU A&B, COVID)  RVPGX2  LIPASE, BLOOD  URINALYSIS, ROUTINE W REFLEX MICROSCOPIC  TROPONIN I (HIGH SENSITIVITY)  TROPONIN I (HIGH SENSITIVITY)    EKG: EKG Interpretation Date/Time:  Monday January 13 2024 03:03:45 EDT Ventricular Rate:  74 PR Interval:  216 QRS Duration:  97 QT Interval:  389 QTC Calculation: 432 R Axis:   61  Text Interpretation: Sinus rhythm Borderline prolonged PR interval Minimal ST elevation, anterior leads peaked T waves Confirmed by Carita Senior [phone]) on 01/13/2024 3:48:43 AM  Radiology: [facility] Chest Portable 1 View Result Date: 01/13/2024 CLINICAL DATA:  Chills x4 days. EXAM: PORTABLE CHEST 1 VIEW COMPARISON:  February 07, 2021 FINDINGS: The cardiac silhouette is enlarged and unchanged in size. Low lung volumes are noted. Stable prominence of the central pulmonary vasculature is seen. Both lungs are clear. The visualized skeletal structures are unremarkable. IMPRESSION: Cardiomegaly with stable central pulmonary vascular congestion. Electronically Signed   By: Suzen Dials M.D.   On:  01/13/2024 03:24     Procedures   Medications Ordered in the ED  acetaminophen  (TYLENOL ) tablet 1,000 mg (1,000 mg Oral Given 01/13/24 0409)  sodium zirconium cyclosilicate  (LOKELMA ) packet 10 g (10 g Oral Given 01/13/24 0416)  calcium  gluconate 1 g/ 50 mL sodium chloride  IVPB (1,000 mg Intravenous New Bag/Given 01/13/24 0415)  sodium bicarbonate  injection 50 mEq (50 mEq Intravenous Given 01/13/24 0410)    Clinical Course as of 01/13/24 9361  [facility] Jan 13, 2024  9571 Consult to nephrology Dr.  Dolan, who is agreeable to dialyzing this patient this morning, he will present to the bedside to see the patient.  I appreciate collaboration care of the patient. [RS]  9365 Dialysis patient with URI. Dialyzing at 7a.  [CB]    Clinical Course User Index [CB] Beola Terrall RAMAN, PA-C [RS] Ash Mcelwain, Pleasant JONELLE RIGGERS                                 Medical Decision Making 68 year old male presents with chills, fatigue, diaphoresis, nausea and diarrhea.  CBC with anemia of 9 point hypertensive on, vitals otherwise normal.  Cardiopulmonary and abdominal exams unremarkable, patient well-appearing, GCS 15.  Pleasant.    Amount and/or Complexity of Data Reviewed Labs: ordered.    Details: CBC with mild anemia hemoglobin 11.4 near patient's baseline.  CMP with hyperkalemia of 6.6, baseline creatinine of 11.5.  Radiology: ordered.    Details: chest x-ray with cardiomegaly with stable central pulmonary vascular congestion. ECG/medicine tests:     Details:  EKG with peaked T waves,   Risk OTC drugs. Prescription drug management.   Clinical patient was consistent with acute viral syndrome, however patient with critical high potassium with EKG changes requiring emergent HD.  Patient to go to the dialysis unit upstairs at 7 AM.  Will return to the emergency department for disposition after dialysis.  Oncoming ED team made aware.  Romolo voiced understanding of his medical evaluation and treatment plan.  Each of their questions answered to their expressed satisfaction.  Return precautions were given.  Patient is well-appearing, stable, and was discharged in good condition.  This chart was dictated using voice recognition software, Dragon. Despite the best efforts of this provider to proofread and correct errors, errors may still occur which can change documentation meaning.      Final diagnoses:  None    ED Discharge Orders     None          Bobette Pleasant JONELLE RIGGERS 01/13/24 9361    Carita Senior, MD 01/13/24 [phone]

## 2024-01-13 NOTE — Discharge Instructions (Signed)
 You are seen today for hyperkalemia.  As well as flulike symptoms.  Recommend you continue to follow-up with hemodialysis and maintain your regular schedule, be sure to also recheck your blood pressure medication at home and take blood pressure patient's at home.  As it was elevated today.  Return for any new or worsening symptoms

## 2024-01-13 NOTE — Progress Notes (Signed)
 Late Note Entry- January 13, 2024  Noted pt's d/c from ED today. Contacted [facility] to be advised of pt's d/c today from ED to home.   Randine Mungo Dialysis Navigator [phone]

## 2024-01-13 NOTE — ED Notes (Signed)
 CCMD notified. Pt is on monitor.

## 2024-01-13 NOTE — Progress Notes (Signed)
 Received patient in bed to unit.  Alert and oriented.  Informed consent signed and in chart.   TX duration:3.5  Patient tolerated well.  Transported back to the room  Alert, without acute distress.  Hand-off given to patient's nurse.   Access used: AVF Access issues: NA  Total UF removed: 3.5L Medication(s) given: NA Post HD weight: 77.7KG   01/13/24 1246  Vitals  Temp 98.1 F (36.7 C)  Temp Source Oral  BP (!) 175/86  MAP (mmHg) 107  Pulse Rate 78  ECG Heart Rate 81  Resp (!) 22  Weight 77.7 kg  Type of Weight Post-Dialysis  Oxygen Therapy  SpO2 100 %  O2 Device Room Air  During Treatment Monitoring  Blood Flow Rate (mL/min) 0 mL/min  Arterial Pressure (mmHg) 33.73 mmHg  Venous Pressure (mmHg) -24.64 mmHg  TMP (mmHg) 25.65 mmHg  Ultrafiltration Rate (mL/min) 1201 mL/min  Dialysate Flow Rate (mL/min) 300 ml/min  Dialysate Potassium Concentration 2  Dialysate Calcium  Concentration 2.5  Duration of HD Treatment -hour(s) 3.5 hour(s)  Cumulative Fluid Removed (mL) per Treatment  3500.23  HD Safety Checks Performed Yes  Intra-Hemodialysis Comments Tx completed  Post Treatment  Dialyzer Clearance Lightly streaked  Liters Processed 84  Fluid Removed (mL) 3.5 mL  Tolerated HD Treatment Yes  AVG/AVF Arterial Site Held (minutes) 5 minutes  AVG/AVF Venous Site Held (minutes) 5 minutes  Fistula / Graft Left Upper arm Arteriovenous fistula  Placement Date/Time: 02/10/21 1438   Placed prior to admission: No  Orientation: Left  Access Location: Upper arm  Access Type: Arteriovenous fistula  Site Condition No complications  Fistula / Graft Assessment Present;Thrill;Bruit  Status Deaccessed    [facility]

## 2024-01-13 NOTE — Procedures (Signed)
 Asked to see this patient for hospital dialysis. Pt presented to ED w/ c/o chills uncontrollable starting after his last HD session. Diarrhea x 1, + nausea started today. Last HD Friday (MWF schedule, does not miss HD). In ED K+ 6.6, Ca 8.6, no vol issues. EKG showed peaked T waves. H/o HTN, EF 50%, chronic hep C. Pt was given IV bicarb and IV Ca and sent to HD around 8 am. Pt is not being admitted at this time. When dialysis is completed pt will be sent back to ED for reassessment.    Pt seen at 10 am, pt is feeling much better. States he had a baked potato this weekend, which he knows is high in K+. Suspect the tremors/ chills were related to hyperkalemia causing muscle weakness. Pt is improving.   I was present at the procedure, reviewed the HD regimen and made appropriate changes.   Myer Fret MD  [facility] 01/13/2024, 11:23 AM

## 2024-01-13 NOTE — Medical Student Note (Incomplete)
 [facility]-EMERGENCY DEPT Provider Student Note For educational purposes for Medical, PA and NP students only and not part of the legal medical record.   CSN: [id number] Arrival date & time: 01/13/24  0221      History   Chief Complaint Chief Complaint  Patient presents with   Chills    HPI Jeffrey Zuniga is a 67 y.o. male w/ PMH of HFpEF, ESRD, and T2DM who presents w/ a CC of chills and nausea after dialysis Friday, bilateral shoulder/neck pain, and an episode of SOB that awoke him from sleep last night. Patient reports that since a few hours post-dialysis on Friday he has been having chills, diaphoresis, sore throat, runny nose, nausea, fatigue, dizziness, and bilateral pain that radiates from his neck to his shoulders. Patient says symptoms have been worsening since Friday; also had 2 episodes of diarrhea today. Patient reports that last night he was awoken from sleep d/t SOB which prompted him to come in and be seen. Patient states that nothing similar has happened before. He is UTD on all vaccines, including covid/flu. No cough, vomiting, edema.   HPI  Past Medical History:  Diagnosis Date   Acute on chronic heart failure (HCC) 10/28/2020   Acute on chronic kidney failure (HCC)    Anemia 04/04/2020   Blood transfusion without reported diagnosis    Diabetes mellitus without complication (HCC)    Gastritis and duodenitis 03/28/2021   GI bleed    H. pylori infection    Heart failure with preserved ejection fraction (HCC) 06/01/2021   transthoracic echocardiogram 02/08/21: severe concentric left ventricular hypertrophy. Left ventricular diastolic  parameters are consistent with Grade II diastolic dysfunction. Elevated left ventricular end-diastolic pressure.    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 associated with type 2 diabetes mellitus (HCC) 04/06/2021   Severe nonproliferative diabetic retinopathy of left eye, with macular edema, associated with type 2 diabetes mellitus (HCC) 04/06/2021   Chronic hepatitis C without hepatic coma (HCC) 03/03/2021   Anemia associated with chronic renal failure    Thrombocytopenia (HCC)    Vision loss of right eye    Heart failure with preserved ejection fraction (HCC) 02/08/2021   LVH (left ventricular hypertrophy) 04/04/2020   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 04/01/2020   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/2020    Past Surgical History:  Procedure Laterality Date   AV FISTULA PLACEMENT Left 02/10/2021   Procedure: LEFT UPPER EXTREMITY ARTERIOVENOUS (AV) FISTULA  CREATION;  Surgeon: Sheree Penne Bruckner, MD;  Location: [facility];  Service: Vascular;  Laterality: Left;   BIOPSY  08/31/2020   Procedure: BIOPSY;  Surgeon: Wilhelmenia Aloha Raddle., MD;  Location: [facility] ENDOSCOPY;  Service: Gastroenterology;;   BIOPSY  03/06/2021   Procedure: BIOPSY;  Surgeon: Albertus Gordy HERO, MD;  Location: [facility] ENDOSCOPY;  Service: Gastroenterology;;   CARDIAC CATHETERIZATION     COLONOSCOPY WITH PROPOFOL  N/A 08/31/2020   Procedure: COLONOSCOPY WITH PROPOFOL ;  Surgeon: Wilhelmenia Aloha Raddle., MD;  Location: [facility] ENDOSCOPY;  Service: Gastroenterology;  Laterality:  N/A;   ENTEROSCOPY N/A 03/06/2021   Procedure: ENTEROSCOPY;  Surgeon: Albertus Gordy HERO, MD;  Location: [facility] ENDOSCOPY;  Service: Gastroenterology;  Laterality: N/A;   ESOPHAGOGASTRODUODENOSCOPY (EGD) WITH PROPOFOL  N/A 08/31/2020   Procedure: ESOPHAGOGASTRODUODENOSCOPY (EGD) WITH PROPOFOL ;  Surgeon: Wilhelmenia  Aloha Raddle., MD;  Location: [facility] ENDOSCOPY;  Service: Gastroenterology;  Laterality: N/A;   GIVENS CAPSULE STUDY N/A 03/04/2021   Procedure: GIVENS CAPSULE STUDY;  Surgeon: Avram Lupita BRAVO, MD;  Location: [facility] ENDOSCOPY;  Service: Endoscopy;  Laterality: N/A;   HEMOSTASIS CLIP PLACEMENT  03/06/2021   Procedure: HEMOSTASIS CLIP PLACEMENT;  Surgeon: Albertus Gordy HERO, MD;  Location: [facility] ENDOSCOPY;  Service: Gastroenterology;;   HOT HEMOSTASIS N/A 03/06/2021   Procedure: HOT HEMOSTASIS (ARGON PLASMA COAGULATION/BICAP);  Surgeon: Albertus Gordy HERO, MD;  Location: [facility] ENDOSCOPY;  Service: Gastroenterology;  Laterality: N/A;   IR FLUORO GUIDE CV LINE RIGHT  02/09/2021   IR US  GUIDE VASC ACCESS RIGHT  02/09/2021   POLYPECTOMY  08/31/2020   Procedure: POLYPECTOMY;  Surgeon: Mansouraty, Aloha Raddle., MD;  Location: [facility] ENDOSCOPY;  Service: Gastroenterology;;       Home Medications    Prior to Admission medications   Medication Sig Start Date End Date Taking? Authorizing Provider  amLODipine  (NORVASC ) 10 MG tablet TAKE 1 TABLET BY MOUTH ONCE DAILY . APPOINTMENT REQUIRED FOR FUTURE REFILLS 07/05/22   Marlee Lynwood NOVAK, MD  calcium  acetate (PHOSLO ) 667 MG capsule Take 1,334 mg by mouth 3 (three) times daily. 05/23/21   [provider]  Darbepoetin Alfa  (ARANESP ) 200 MCG/0.4ML SOSY injection Inject 0.4 mLs (200 mcg total) into the vein every Monday with hemodialysis. 03/13/21   Demaio, Alexa, MD  ferrous sulfate  325 (65 FE) MG tablet Take 1 tablet (325 mg total) by mouth daily with breakfast. 06/02/21   Cresenzo, John V, MD  hydrALAZINE  (APRESOLINE ) 25 MG tablet Take 25 mg by mouth 3 (three) times daily. 04/26/21   [provider]  isosorbide  mononitrate (IMDUR ) 60 MG 24 hr tablet Take 1 tablet by mouth once daily 04/12/23   Sanford, James B, MD  latanoprost  (XALATAN ) 0.005 % ophthalmic solution Place 1 drop into the left eye at bedtime. 03/06/21   Addie Perkins, DO  pantoprazole  (PROTONIX ) 40 MG tablet Take 1  tablet (40 mg total) by mouth daily. 03/28/21   Marlee Lynwood NOVAK, MD  prednisoLONE  acetate (PRED FORTE ) 1 % ophthalmic suspension Place 1 drop into the right eye 4 (four) times daily.    [provider]  Sofosbuvir -Velpatasvir  (EPCLUSA ) 400-100 MG TABS Take 1 tablet by mouth daily. 03/09/21   Kuppelweiser, Cassie L, RPH-CPP    Family History Family History  Problem Relation Age of Onset   Liver disease Neg Hx    Liver cancer Neg Hx     Social History Social History   Tobacco Use   Smoking status: Every Day    Current packs/day: 0.25    Types: Cigarettes   Smokeless tobacco: Never   Tobacco comments:    2021   i AM CUTTING BACK :  Vaping Use   Vaping status: Never Used  Substance Use Topics   Alcohol use: Not Currently   Drug use: Never     Allergies   Patient has no known allergies.   Review of Systems Review of Systems   Physical Exam Updated Vital Signs BP (!) 166/73   Pulse 74   Temp 98.1 F (36.7 C) (Oral)   Resp 18   Ht 5' 7 (1.702 m)  Wt 79.8 kg   SpO2 100%   BMI 27.57 kg/m   Physical Exam   ED Treatments / Results  Labs (all labs ordered are listed, but only abnormal results are displayed) Labs Reviewed  LIPASE, BLOOD  COMPREHENSIVE METABOLIC PANEL WITH GFR  CBC  URINALYSIS, ROUTINE W REFLEX MICROSCOPIC  BRAIN NATRIURETIC PEPTIDE  TROPONIN I (HIGH SENSITIVITY)    EKG  Radiology No results found.  Procedures Procedures (including critical care time)  Medications Ordered in ED Medications - No data to display   Initial Impression / Assessment and Plan / ED Course  I have reviewed the triage vital signs and the nursing notes.  Pertinent labs & imaging results that were available during my care of the patient were reviewed by me and considered in my medical decision making (see chart for details).     ***  Final Clinical Impressions(s) / ED Diagnoses   Final diagnoses:  None    New Prescriptions New  Prescriptions   No medications on file

## 2024-01-14 LAB — HEPATITIS B SURFACE ANTIBODY, QUANTITATIVE: Hep B S AB Quant (Post): 8.2 m[IU]/mL — ABNORMAL LOW
